# Patient Record
Sex: Female | Born: 1940 | Race: Black or African American | Hispanic: No | Smoking: Never smoker
Health system: Southern US, Community
[De-identification: ages and names within clinical notes are randomized; demographics above are authoritative.]

## PROBLEM LIST (undated history)

## (undated) DIAGNOSIS — Z8679 Personal history of other diseases of the circulatory system: Secondary | ICD-10-CM

## (undated) DIAGNOSIS — I10 Essential (primary) hypertension: Secondary | ICD-10-CM

## (undated) DIAGNOSIS — I495 Sick sinus syndrome: Secondary | ICD-10-CM

## (undated) DIAGNOSIS — I480 Paroxysmal atrial fibrillation: Secondary | ICD-10-CM

## (undated) DIAGNOSIS — Z952 Presence of prosthetic heart valve: Secondary | ICD-10-CM

## (undated) DIAGNOSIS — Z45018 Encounter for adjustment and management of other part of cardiac pacemaker: Secondary | ICD-10-CM

## (undated) DIAGNOSIS — T4145XA Adverse effect of unspecified anesthetic, initial encounter: Secondary | ICD-10-CM

## (undated) DIAGNOSIS — I5032 Chronic diastolic (congestive) heart failure: Secondary | ICD-10-CM

## (undated) DIAGNOSIS — R0602 Shortness of breath: Secondary | ICD-10-CM

## (undated) DIAGNOSIS — T8859XA Other complications of anesthesia, initial encounter: Secondary | ICD-10-CM

## (undated) DIAGNOSIS — Z9289 Personal history of other medical treatment: Secondary | ICD-10-CM

## (undated) DIAGNOSIS — N189 Chronic kidney disease, unspecified: Secondary | ICD-10-CM

## (undated) DIAGNOSIS — M1990 Unspecified osteoarthritis, unspecified site: Secondary | ICD-10-CM

## (undated) DIAGNOSIS — Z950 Presence of cardiac pacemaker: Secondary | ICD-10-CM

## (undated) DIAGNOSIS — I350 Nonrheumatic aortic (valve) stenosis: Secondary | ICD-10-CM

## (undated) HISTORY — DX: Presence of prosthetic heart valve: Z95.2

## (undated) HISTORY — PX: CARDIAC VALVE REPLACEMENT: SHX585

## (undated) HISTORY — PX: JOINT REPLACEMENT: SHX530

## (undated) HISTORY — PX: CARDIAC CATHETERIZATION: SHX172

## (undated) HISTORY — DX: Personal history of other diseases of the circulatory system: Z86.79

---
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
## 2001-05-06 ENCOUNTER — Encounter: Payer: Self-pay | Admitting: Family Medicine

## 2001-05-06 ENCOUNTER — Ambulatory Visit (HOSPITAL_COMMUNITY): Admission: RE | Admit: 2001-05-06 | Discharge: 2001-05-06 | Payer: Self-pay | Admitting: Family Medicine

## 2002-12-31 ENCOUNTER — Ambulatory Visit (HOSPITAL_COMMUNITY): Admission: RE | Admit: 2002-12-31 | Discharge: 2002-12-31 | Payer: Self-pay | Admitting: Family Medicine

## 2011-08-17 ENCOUNTER — Inpatient Hospital Stay (HOSPITAL_COMMUNITY)
Admission: EM | Admit: 2011-08-17 | Discharge: 2011-08-23 | DRG: 292 | Disposition: A | Discharge status: Home / Self Care | Payer: Medicare Other | Source: Ambulatory Visit | Attending: Internal Medicine | Admitting: Internal Medicine

## 2011-08-17 ENCOUNTER — Encounter (HOSPITAL_COMMUNITY): Payer: Self-pay | Admitting: Emergency Medicine

## 2011-08-17 ENCOUNTER — Emergency Department (HOSPITAL_COMMUNITY): Payer: Medicare Other

## 2011-08-17 DIAGNOSIS — R0602 Shortness of breath: Secondary | ICD-10-CM

## 2011-08-17 DIAGNOSIS — J90 Pleural effusion, not elsewhere classified: Secondary | ICD-10-CM

## 2011-08-17 DIAGNOSIS — R0902 Hypoxemia: Secondary | ICD-10-CM | POA: Diagnosis present

## 2011-08-17 DIAGNOSIS — E876 Hypokalemia: Secondary | ICD-10-CM

## 2011-08-17 DIAGNOSIS — R601 Generalized edema: Secondary | ICD-10-CM | POA: Diagnosis present

## 2011-08-17 DIAGNOSIS — Z7982 Long term (current) use of aspirin: Secondary | ICD-10-CM

## 2011-08-17 DIAGNOSIS — R7309 Other abnormal glucose: Secondary | ICD-10-CM | POA: Diagnosis present

## 2011-08-17 DIAGNOSIS — I10 Essential (primary) hypertension: Secondary | ICD-10-CM

## 2011-08-17 DIAGNOSIS — R188 Other ascites: Secondary | ICD-10-CM | POA: Diagnosis present

## 2011-08-17 DIAGNOSIS — I509 Heart failure, unspecified: Secondary | ICD-10-CM

## 2011-08-17 DIAGNOSIS — I2789 Other specified pulmonary heart diseases: Secondary | ICD-10-CM | POA: Diagnosis present

## 2011-08-17 DIAGNOSIS — I359 Nonrheumatic aortic valve disorder, unspecified: Secondary | ICD-10-CM | POA: Diagnosis present

## 2011-08-17 DIAGNOSIS — I5033 Acute on chronic diastolic (congestive) heart failure: Secondary | ICD-10-CM

## 2011-08-17 HISTORY — DX: Essential (primary) hypertension: I10

## 2011-08-17 LAB — CBC
Platelets: 179 10*3/uL (ref 150–400)
RBC: 4.1 MIL/uL (ref 3.87–5.11)
RDW: 16.7 % — ABNORMAL HIGH (ref 11.5–15.5)
WBC: 5.5 10*3/uL (ref 4.0–10.5)

## 2011-08-17 LAB — BASIC METABOLIC PANEL
Calcium: 9 mg/dL (ref 8.4–10.5)
GFR calc Af Amer: 63 mL/min — ABNORMAL LOW (ref >90)
GFR calc non Af Amer: 54 mL/min — ABNORMAL LOW (ref >90)
Sodium: 142 mEq/L (ref 135–145)

## 2011-08-17 LAB — POCT I-STAT TROPONIN I

## 2011-08-17 LAB — PRO B NATRIURETIC PEPTIDE: Pro B Natriuretic peptide (BNP): 29853 pg/mL — ABNORMAL HIGH (ref 0–125)

## 2011-08-17 MED ORDER — FUROSEMIDE 8 MG/ML PO SOLN: 40.0000 mg | Freq: Once | ORAL | Status: DC

## 2011-08-17 MED ORDER — POTASSIUM CHLORIDE CRYS ER 20 MEQ PO TBCR
40.0000 meq | EXTENDED_RELEASE_TABLET | Freq: Once | ORAL | Status: AC
  Administered 2011-08-17: 40 meq via ORAL
  Filled 2011-08-17: qty 2

## 2011-08-17 MED ORDER — FUROSEMIDE 10 MG/ML IJ SOLN
40.0000 mg | Freq: Once | INTRAMUSCULAR | Status: AC
  Administered 2011-08-17: 40 mg via INTRAVENOUS
  Filled 2011-08-17: qty 4

## 2011-08-17 NOTE — ED Notes (Signed)
O2 sat checked at nurse first for SOB

## 2011-08-17 NOTE — ED Provider Notes (Signed)
History     CSN: [id number]  Arrival date & time 08/17/11  1919   First MD Initiated Contact with Patient 08/17/11 2306      Chief Complaint  Patient presents with  . Shortness of Breath    (Consider location/radiation/quality/duration/timing/severity/associated sxs/prior treatment) HPI History provided by patient. Increased swelling over the last 3-4 months, followed by primary care physician on [address] was referred to cardiologist Dr. Jacinto Halim and had an outpatient echocardiogram done in the office today. Results unknown. Patient developed shortness of breath tonight difficulty breathing presents here for evaluation. No chest pain. No cough or fevers. No history of same. Patient was prescribed Lasix and is unchanged. Shortness of breath is worse with any exertion and lying flat. Moderate in severity. Somewhat relieved by rest. Past Medical History  Diagnosis Date  . Hypertension     History reviewed. No pertinent past surgical history.  No family history on file.  History  Substance Use Topics  . Smoking status: Never Smoker   . Smokeless tobacco: Not on file  . Alcohol Use: No    OB History    Grav Para Term Preterm Abortions TAB SAB Ect Mult Living                  Review of Systems  Constitutional: Negative for fever and chills.  HENT: Negative for neck pain and neck stiffness.   Eyes: Negative for pain.  Respiratory: Positive for shortness of breath. Negative for wheezing.   Cardiovascular: Positive for leg swelling. Negative for chest pain.  Gastrointestinal: Negative for abdominal pain.  Genitourinary: Negative for dysuria.  Musculoskeletal: Negative for back pain.  Skin: Negative for rash.  Neurological: Negative for headaches.  All other systems reviewed and are negative.    Allergies  Review of patient's allergies indicates no known allergies.  Home Medications   Current Outpatient Rx  Name Route Sig Dispense Refill  . ASPIRIN 81 MG PO TABS  Oral Take 81 mg by mouth daily.    . FUROSEMIDE 40 MG PO TABS Oral Take 40 mg by mouth daily.    Marland Kitchen LOSARTAN POTASSIUM 50 MG PO TABS Oral Take 50 mg by mouth daily.    Marland Kitchen METOPROLOL TARTRATE 25 MG PO TABS Oral Take 25 mg by mouth 2 (two) times daily.    Marland Kitchen TIZANIDINE HCL 4 MG PO TABS Oral Take 4 mg by mouth every 6 (six) hours as needed. For pain      BP 171/64  Pulse 76  Temp 98.5 F (36.9 C) (Oral)  Resp 19  SpO2 98%  Physical Exam  Constitutional: She is oriented to person, place, and time. She appears well-developed and well-nourished.  HENT:  Head: Normocephalic and atraumatic.  Eyes: Conjunctivae and EOM are normal. Pupils are equal, round, and reactive to light.  Neck: Trachea normal. Neck supple. No thyromegaly present.  Cardiovascular: Normal rate, regular rhythm, S1 normal, S2 normal and normal pulses.     No systolic murmur is present   No diastolic murmur is present  Pulses:      Radial pulses are 2+ on the right side, and 2+ on the left side.  Pulmonary/Chest: Effort normal. She has no wheezes. She has no rhonchi. She has no rales. She exhibits no tenderness.       Somewhat diminished lung sounds bilateral bases, no wheezes or rales  Abdominal: Soft. Normal appearance and bowel sounds are normal. There is no tenderness. There is no CVA tenderness and negative  Murphy's sign.  Musculoskeletal:       BLE:s  Tense 2+ symmetric peripheral edema, Calves nontender, no cords or erythema, negative Homans sign  Neurological: She is alert and oriented to person, place, and time. She has normal strength. No cranial nerve deficit or sensory deficit. GCS eye subscore is 4. GCS verbal subscore is 5. GCS motor subscore is 6.  Skin: Skin is warm and dry. No rash noted. She is not diaphoretic.  Psychiatric: Her speech is normal.       Cooperative and appropriate    ED Course  Procedures (including critical care time)   Results for orders placed during the hospital encounter of  08/17/11  BASIC METABOLIC PANEL      Component Value Range   Sodium 142  135 - 145 mEq/L   Potassium 3.1 (*) 3.5 - 5.1 mEq/L   Chloride 103  96 - 112 mEq/L   CO2 27  19 - 32 mEq/L   Glucose, Bld 119 (*) 70 - 99 mg/dL   BUN 19  6 - 23 mg/dL   Creatinine, Ser 1.61  0.50 - 1.10 mg/dL   Calcium 9.0  8.4 - 09.6 mg/dL   GFR calc non Af Amer 54 (*) >90 mL/min   GFR calc Af Amer 63 (*) >90 mL/min  CBC      Component Value Range   WBC 5.5  4.0 - 10.5 K/uL   RBC 4.10  3.87 - 5.11 MIL/uL   Hemoglobin 12.5  12.0 - 15.0 g/dL   HCT 04.5  40.9 - 81.1 %   MCV 92.9  78.0 - 100.0 fL   MCH 30.5  26.0 - 34.0 pg   MCHC 32.8  30.0 - 36.0 g/dL   RDW 91.4 (*) 78.2 - 95.6 %   Platelets 179  150 - 400 K/uL  PRO B NATRIURETIC PEPTIDE      Component Value Range   Pro B Natriuretic peptide (BNP) 29853.0 (*) 0 - 125 pg/mL  POCT I-STAT TROPONIN I      Component Value Range   Troponin i, poc 0.07  0.00 - 0.08 ng/mL   Comment 3            Dg Chest 2 View  08/17/2011  *RADIOLOGY REPORT*  Clinical Data: Short of breath  CHEST - 2 VIEW  Comparison: None  Findings: Heart silhouette is enlarged.  There is a large left effusion and small right effusion.  Central venous congestion is present.  No pneumothorax.  IMPRESSION:  1.  Asymmetric pleural effusions, left greater than right. Recommend follow-up to ensure resolution as malignancy cannot be excluded. 2.  Cardiomegaly.  Original Report Authenticated By: Genevive Bi, M.D.     Date: 08/17/2011  Rate: 73  Rhythm: normal sinus rhythm  QRS Axis: normal  Intervals: normal  ST/T Wave abnormalities: nonspecific ST/T changes  Conduction Disutrbances:none  Narrative Interpretation:   Old EKG Reviewed: none available  12:02 AM discussed with Dr. Onalee Hua on call for triad, agrees to admission. Will evaluate patient in emergency department.  MDM  SOB and hypoxia.  EKG as above. Labs and x-ray reviewed as above. Patient ambulates in room air pulse ox 89% hypoxic.  Placed on 2 L nasal cannula oxygen. Attempted to get outpatient echo results. Medicine consult for admission and further workup for new onset CHF / pleural effusions        Sunnie Nielsen, MD 08/18/11 0002

## 2011-08-17 NOTE — ED Notes (Signed)
Pt RA pulse ox at rest was 96%. Pt walked one complete lap around POD A on RA. Pt O2 sat decreased to 89% just before she returned to her room. Pt sitting in bed. O2 saturation increasing >92%. While walking pt denies chest tightness only mild shortness of breath

## 2011-08-17 NOTE — ED Notes (Signed)
C/o sob and generalized swelling all over x 4 months.  States SOB worse today.  Speaking in complete sentences.  NAD.

## 2011-08-18 DIAGNOSIS — R609 Edema, unspecified: Secondary | ICD-10-CM

## 2011-08-18 DIAGNOSIS — R0602 Shortness of breath: Secondary | ICD-10-CM

## 2011-08-18 DIAGNOSIS — I5033 Acute on chronic diastolic (congestive) heart failure: Secondary | ICD-10-CM | POA: Diagnosis present

## 2011-08-18 DIAGNOSIS — I509 Heart failure, unspecified: Secondary | ICD-10-CM

## 2011-08-18 DIAGNOSIS — R0902 Hypoxemia: Secondary | ICD-10-CM | POA: Diagnosis present

## 2011-08-18 DIAGNOSIS — I10 Essential (primary) hypertension: Secondary | ICD-10-CM | POA: Diagnosis present

## 2011-08-18 DIAGNOSIS — J90 Pleural effusion, not elsewhere classified: Secondary | ICD-10-CM

## 2011-08-18 LAB — CBC
HCT: 37.6 % (ref 36.0–46.0)
Hemoglobin: 12.4 g/dL (ref 12.0–15.0)
MCH: 30.4 pg (ref 26.0–34.0)
MCHC: 33 g/dL (ref 30.0–36.0)
RDW: 16.7 % — ABNORMAL HIGH (ref 11.5–15.5)

## 2011-08-18 LAB — BASIC METABOLIC PANEL
CO2: 25 mEq/L (ref 19–32)
Calcium: 8.7 mg/dL (ref 8.4–10.5)
GFR calc non Af Amer: 58 mL/min — ABNORMAL LOW (ref >90)
Glucose, Bld: 144 mg/dL — ABNORMAL HIGH (ref 70–99)
Potassium: 3.1 mEq/L — ABNORMAL LOW (ref 3.5–5.1)
Sodium: 142 mEq/L (ref 135–145)

## 2011-08-18 LAB — CARDIAC PANEL(CRET KIN+CKTOT+MB+TROPI)
Relative Index: INVALID (ref 0.0–2.5)
Relative Index: INVALID (ref 0.0–2.5)
Relative Index: INVALID (ref 0.0–2.5)
Total CK: 55 U/L (ref 7–177)
Total CK: 47 U/L (ref 7–177)
Total CK: 40 U/L (ref 7–177)
Troponin I: <0.3 ng/mL (ref <0.30)
Troponin I: <0.3 ng/mL (ref <0.30)

## 2011-08-18 LAB — HEPATIC FUNCTION PANEL
Bilirubin, Direct: 0.3 mg/dL (ref 0.0–0.3)
Indirect Bilirubin: 0.7 mg/dL (ref 0.3–0.9)
Total Protein: 6.3 g/dL (ref 6.0–8.3)

## 2011-08-18 MED ORDER — SODIUM CHLORIDE 0.9 % IJ SOLN
3.0000 mL | Freq: Two times a day (BID) | INTRAMUSCULAR | Status: DC
  Administered 2011-08-18 – 2011-08-22 (×10): 3 mL via INTRAVENOUS

## 2011-08-18 MED ORDER — METOPROLOL TARTRATE 25 MG PO TABS
25.0000 mg | ORAL_TABLET | Freq: Two times a day (BID) | ORAL | Status: DC
  Administered 2011-08-18 (×2): 25 mg via ORAL
  Filled 2011-08-18 (×4): qty 1

## 2011-08-18 MED ORDER — LABETALOL HCL 5 MG/ML IV SOLN
10.0000 mg | Freq: Once | INTRAVENOUS | Status: AC
  Administered 2011-08-18: 10 mg via INTRAVENOUS
  Filled 2011-08-18: qty 4

## 2011-08-18 MED ORDER — ENOXAPARIN SODIUM 40 MG/0.4ML ~~LOC~~ SOLN
40.0000 mg | Freq: Every day | SUBCUTANEOUS | Status: DC
  Administered 2011-08-18 – 2011-08-23 (×6): 40 mg via SUBCUTANEOUS
  Filled 2011-08-18 (×6): qty 0.4

## 2011-08-18 MED ORDER — SODIUM CHLORIDE 0.9 % IJ SOLN: 3.0000 mL | INTRAMUSCULAR | Status: DC | PRN

## 2011-08-18 MED ORDER — POTASSIUM CHLORIDE CRYS ER 20 MEQ PO TBCR
20.0000 meq | EXTENDED_RELEASE_TABLET | Freq: Every day | ORAL | Status: DC
  Administered 2011-08-18 – 2011-08-23 (×6): 20 meq via ORAL
  Filled 2011-08-18 (×6): qty 1

## 2011-08-18 MED ORDER — ONDANSETRON HCL 4 MG/2ML IJ SOLN: 4.0000 mg | Freq: Four times a day (QID) | INTRAMUSCULAR | Status: DC | PRN

## 2011-08-18 MED ORDER — ACETAMINOPHEN 325 MG PO TABS: 650.0000 mg | ORAL_TABLET | ORAL | Status: DC | PRN

## 2011-08-18 MED ORDER — SODIUM CHLORIDE 0.9 % IV SOLN: 250.0000 mL | INTRAVENOUS | Status: DC | PRN

## 2011-08-18 MED ORDER — LOSARTAN POTASSIUM 50 MG PO TABS
50.0000 mg | ORAL_TABLET | Freq: Every day | ORAL | Status: DC
  Administered 2011-08-18 – 2011-08-21 (×4): 50 mg via ORAL
  Filled 2011-08-18 (×4): qty 1

## 2011-08-18 MED ORDER — POTASSIUM CHLORIDE CRYS ER 20 MEQ PO TBCR
40.0000 meq | EXTENDED_RELEASE_TABLET | Freq: Once | ORAL | Status: AC
  Administered 2011-08-18: 40 meq via ORAL
  Filled 2011-08-18: qty 2

## 2011-08-18 MED ORDER — FUROSEMIDE 10 MG/ML IJ SOLN
40.0000 mg | Freq: Every day | INTRAMUSCULAR | Status: DC
  Administered 2011-08-18 – 2011-08-19 (×2): 40 mg via INTRAVENOUS
  Filled 2011-08-18 (×2): qty 4

## 2011-08-18 MED ORDER — SODIUM CHLORIDE 0.9 % IV SOLN
250.0000 mL | INTRAVENOUS | Status: DC | PRN
  Administered 2011-08-18: 250 mL via INTRAVENOUS

## 2011-08-18 MED ORDER — SODIUM CHLORIDE 0.9 % IJ SOLN: 3.0000 mL | Freq: Two times a day (BID) | INTRAMUSCULAR | Status: DC

## 2011-08-18 MED ORDER — ASPIRIN EC 81 MG PO TBEC
81.0000 mg | DELAYED_RELEASE_TABLET | Freq: Every day | ORAL | Status: DC
  Administered 2011-08-18 – 2011-08-21 (×4): 81 mg via ORAL
  Filled 2011-08-18 (×4): qty 1

## 2011-08-18 NOTE — Progress Notes (Signed)
Utilization review completed.  

## 2011-08-18 NOTE — Progress Notes (Signed)
Subjective:   Chart reviewed. Patient indicates that she feels whole lot better-decreasing swelling of her arms, legs and abdomen. Denies ever having dyspnea. No chest pain.   Saw Dr. Jacinto Halim on 6/19 and had echo at his office on 6/27.  Objective  Vital signs in last 24 hours: Filed Vitals:   08/18/11 0429 08/18/11 0433 08/18/11 1113 08/18/11 1344  BP: 153/58 150/62 169/80 146/54  Pulse: 77  68 74  Temp: 98 F (36.7 C)   97.3 F (36.3 C)  TempSrc: Oral   Oral  Resp: 20   20  Height:      Weight:      SpO2: 91%  100% 100%   Weight change:   Intake/Output Summary (Last 24 hours) at 08/18/11 1839 Last data filed at 08/18/11 1700  Gross per 24 hour  Intake    360 ml  Output   1550 ml  Net  -1190 ml    Physical Exam:  General Exam: Comfortable.  Respiratory System: Slightly reduced breath sounds in the bases with occasional basal crackles. Rest of lung fields are clear to auscultation. No increased work of breathing.  Cardiovascular System: First and second heart sounds heard. Regular rate and rhythm. No JVD/murmurs. Telemetry shows sinus rhythm with first-degree heart block. Gastrointestinal System: Abdomen is non distended, soft and normal bowel sounds heard. Anterior abdominal wall edema. Nontender.  Central Nervous System: Alert and oriented. No focal neurological deficits. Extremities: 3+ chronic pitting bilateral lower extremity edema extending up the anterior abdominal wall anteriorly and sacrum posteriorly. No edema of upper extremities.  Labs:  Basic Metabolic Panel:  Lab 08/18/11 1610 08/18/11 0339 08/17/11 1944  NA 142 -- 142  K 3.1* -- 3.1*  CL 103 -- 103  CO2 25 -- 27  GLUCOSE 144* -- 119*  BUN 21 -- 19  CREATININE 0.96 1.01 1.02  CALCIUM 8.7 -- 9.0  ALB -- -- --  PHOS -- -- --   Liver Function Tests:  Lab 08/17/11 2314  AST 26  ALT 23  ALKPHOS 62  BILITOT 1.0  PROT 6.3  ALBUMIN 3.2*   No results found for this basename: LIPASE:3,AMYLASE:3 in  the last 168 hours No results found for this basename: AMMONIA:3 in the last 168 hours CBC:  Lab 08/18/11 0339 08/17/11 1944  WBC 4.6 5.5  NEUTROABS -- --  HGB 12.4 12.5  HCT 37.6 38.1  MCV 92.2 92.9  PLT 184 179   Cardiac Enzymes:  Lab 08/18/11 0955 08/18/11 0339  CKTOTAL 47 55  CKMB 4.3* 4.3*  CKMBINDEX -- --  TROPONINI <0.30 <0.30   CBG: No results found for this basename: GLUCAP:5 in the last 168 hours  Iron Studies: No results found for this basename: IRON,TIBC,TRANSFERRIN,FERRITIN in the last 72 hours Studies/Results: Dg Chest 2 View  08/17/2011  *RADIOLOGY REPORT*  Clinical Data: Short of breath  CHEST - 2 VIEW  Comparison: None  Findings: Heart silhouette is enlarged.  There is a large left effusion and small right effusion.  Central venous congestion is present.  No pneumothorax.  IMPRESSION:  1.  Asymmetric pleural effusions, left greater than right. Recommend follow-up to ensure resolution as malignancy cannot be excluded. 2.  Cardiomegaly.  Original Report Authenticated By: Genevive Bi, M.D.   Medications:      . aspirin EC  81 mg Oral Daily  . enoxaparin  40 mg Subcutaneous Daily  . furosemide  40 mg Intravenous Once  . furosemide  40 mg Intravenous Daily  .  labetalol  10 mg Intravenous Once  . losartan  50 mg Oral Daily  . metoprolol tartrate  25 mg Oral BID  . potassium chloride  20 mEq Oral Daily  . potassium chloride  40 mEq Oral Once  . sodium chloride  3 mL Intravenous Q12H  . DISCONTD: furosemide  40 mg Oral Once  . DISCONTD: sodium chloride  3 mL Intravenous Q12H    I  have reviewed scheduled and prn medications.     Problem/Plan: Principal Problem:  *CHF exacerbation Active Problems:  SOB (shortness of breath)  Pleural effusion, bilateral  Hypertension  Hypoxia  1. CHF exacerbation, possibly acute on chronic diastolic: Diuresing well. Continue IV Lasix. Improving. Called primary cardiologist office to obtain records. Await 2-D echo  report-will be available on 7/1. 2. Hypokalemia: Replete and follow. 3. Pleural effusions: Possibly secondary to problem #1. Followup chest x-ray to insure resolution. 4. Hypertension: Controlled. 5. Hypoxia: Secondary to CHF and pleural effusions. Treat above and monitor.  [name] 08/18/2011,6:39 PM  LOS: 1 day

## 2011-08-18 NOTE — H&P (Signed)
Chief Complaint:  sob  HPI: 71 yo female newly dx chf a couple of months ago new on lasix also last several months comes in with worsening sob and le edema.  Her cousin finally made her come to ED.  She really does not complain much and have to really pull pertinent history from her.  She does admit to some orthopnea.  Denies any cp, n,v.  Le edema has been the same and no better with po lasix at home.  She did have a 2d echo done by her pcp today and she has been told that "her heart does not pump right".  She denies any fevers or cough.  She does not require oxygen at home and says her sob gets worse with exertion.  Review of Systems:  O/w neg  Past Medical History: Past Medical History  Diagnosis Date  . Hypertension    History reviewed. No pertinent past surgical history.  Medications: Prior to Admission medications   Medication Sig Start Date End Date Taking? Authorizing Provider  aspirin 81 MG tablet Take 81 mg by mouth daily.   Yes Historical Provider, MD  furosemide (LASIX) 40 MG tablet Take 40 mg by mouth daily.   Yes Historical Provider, MD  losartan (COZAAR) 50 MG tablet Take 50 mg by mouth daily.   Yes Historical Provider, MD  metoprolol tartrate (LOPRESSOR) 25 MG tablet Take 25 mg by mouth 2 (two) times daily.   Yes Historical Provider, MD  tiZANidine (ZANAFLEX) 4 MG tablet Take 4 mg by mouth every 6 (six) hours as needed. For pain   Yes Historical Provider, MD    Allergies:  No Known Allergies  Social History:  reports that she has never smoked. She does not have any smokeless tobacco history on file. She reports that she does not drink alcohol or use illicit drugs.  Physical Exam: Filed Vitals:   08/17/11 1920 08/17/11 1929  BP:  171/64  Pulse: 80 76  Temp:  98.5 F (36.9 C)  TempSrc:  Oral  Resp:  19  SpO2: 95% 98%   General appearance: alert, cooperative and no distress Neck: no carotid bruit, no JVD and supple, symmetrical, trachea midline Lungs:  diminished breath sounds bilaterally Heart: regular rate and rhythm, S1, S2 normal, no murmur, click, rub or gallop Abdomen: soft, non-tender; bowel sounds normal; no masses,  no organomegaly Extremities: extremities normal, atraumatic, no cyanosis or 1+ pitting edema ble Pulses: 2+ and symmetric Skin: Skin color, texture, turgor normal. No rashes or lesions Neurologic: Grossly normal    Labs on Admission:   [facility] 08/17/11 1944  NA 142  K 3.1*  CL 103  CO2 27  GLUCOSE 119*  BUN 19  CREATININE 1.02  CALCIUM 9.0  MG --  PHOS --    Basename 08/17/11 1944  WBC 5.5  NEUTROABS --  HGB 12.5  HCT 38.1  MCV 92.9  PLT 179   Radiological Exams on Admission: Dg Chest 2 View  08/17/2011  *RADIOLOGY REPORT*  Clinical Data: Short of breath  CHEST - 2 VIEW  Comparison: None  Findings: Heart silhouette is enlarged.  There is a large left effusion and small right effusion.  Central venous congestion is present.  No pneumothorax.  IMPRESSION:  1.  Asymmetric pleural effusions, left greater than right. Recommend follow-up to ensure resolution as malignancy cannot be excluded. 2.  Cardiomegaly.  Original Report Authenticated By: Genevive Bi, M.D.    Assessment/Plan Present on Admission:  71 yo female acute  chf exacerbation .CHF exacerbation .SOB (shortness of breath) .Pleural effusion, bilateral .Hypertension .Hypoxia  ekg no acute changes.  Will place on iv lasix.  Obtain echo that was done today as outpt.  Serial cardiac enzymes.   chf pathway.  Oxygen.  cxr will need to be followed to monitor pleural effusion left >right.  Wbc nml, no fevers will not tx with abx at this time.    Hussam Muniz A [phone] 08/18/2011, 12:04 AM

## 2011-08-18 NOTE — Progress Notes (Signed)
Pt's B/P is 174/76. Pt is asymptomatic at this time. MD made aware. New orders placed. Will continue to monitor closely. Sharlene Dory, RN

## 2011-08-19 DIAGNOSIS — R609 Edema, unspecified: Secondary | ICD-10-CM

## 2011-08-19 DIAGNOSIS — J90 Pleural effusion, not elsewhere classified: Secondary | ICD-10-CM

## 2011-08-19 DIAGNOSIS — E876 Hypokalemia: Secondary | ICD-10-CM

## 2011-08-19 DIAGNOSIS — I5033 Acute on chronic diastolic (congestive) heart failure: Secondary | ICD-10-CM

## 2011-08-19 LAB — BASIC METABOLIC PANEL
BUN: 21 mg/dL (ref 6–23)
Chloride: 107 mEq/L (ref 96–112)
GFR calc Af Amer: 74 mL/min — ABNORMAL LOW (ref >90)
GFR calc non Af Amer: 64 mL/min — ABNORMAL LOW (ref >90)
Potassium: 4.2 mEq/L (ref 3.5–5.1)
Sodium: 144 mEq/L (ref 135–145)

## 2011-08-19 MED ORDER — METOPROLOL TARTRATE 12.5 MG HALF TABLET
12.5000 mg | ORAL_TABLET | Freq: Two times a day (BID) | ORAL | Status: DC
  Administered 2011-08-19 – 2011-08-23 (×9): 12.5 mg via ORAL
  Filled 2011-08-19 (×10): qty 1

## 2011-08-19 MED ORDER — FUROSEMIDE 10 MG/ML IJ SOLN
40.0000 mg | Freq: Two times a day (BID) | INTRAMUSCULAR | Status: DC
  Administered 2011-08-19 – 2011-08-20 (×2): 40 mg via INTRAVENOUS
  Filled 2011-08-19 (×4): qty 4

## 2011-08-19 NOTE — Progress Notes (Signed)
Subjective:   Feels better. Dyspnea on mild exertion. No chest pain. Decreasing swelling of legs. Gives approximately 6 month history of gradually worsening swelling of legs, hands, abdomen and orthopnea.  Saw Dr. Jacinto Halim on 6/19 and had echo at his office on 6/27. Reviewed office consult note-anasarca, rule out nephrotic range proteinuria, aortic incompetence. Was started on Lasix, beta blockers and was to followup with echo results-pending.  Objective  Vital signs in last 24 hours: Filed Vitals:   08/18/11 1113 08/18/11 1344 08/18/11 2201 08/19/11 0441  BP: 169/80 146/54 142/56 140/66  Pulse: 68 74 70 72  Temp:  97.3 F (36.3 C) 98.4 F (36.9 C) 98 F (36.7 C)  TempSrc:  Oral Oral Oral  Resp:  20 20 20   Height:    5\' 4"  (1.626 m)  Weight:    82.4 kg (181 lb 10.5 oz)  SpO2: 100% 100% 97% 97%   Weight change: 0.2 kg (7.1 oz)  Intake/Output Summary (Last 24 hours) at 08/19/11 1345 Last data filed at 08/19/11 1055  Gross per 24 hour  Intake    343 ml  Output    902 ml  Net   -559 ml    Physical Exam:  General Exam: Comfortable.  Respiratory System: Slightly reduced breath sounds in the bases with occasional basal crackles. Rest of lung fields are clear to auscultation. No increased work of breathing.  Cardiovascular System: First and second heart sounds heard. Regular rate and rhythm. No JVD/murmurs. Telemetry shows sinus rhythm in the 70s but does have periods of sinus bradycardia in the 30s to 40s (possibly during sleep and asymptomatic). Gastrointestinal System: Abdomen is non distended, soft and normal bowel sounds heard. Anterior abdominal wall edema. Nontender.  Central Nervous System: Alert and oriented. No focal neurological deficits. Extremities: 3+ chronic pitting bilateral lower extremity edema extending up the anterior abdominal wall anteriorly and sacrum posteriorly-decreasing. No edema of upper extremities.  Labs:  Basic Metabolic Panel:  Lab 08/19/11 1610  08/18/11 0955 08/18/11 0339 08/17/11 1944  NA 144 142 -- 142  K 4.2 3.1* -- 3.1*  CL 107 103 -- 103  CO2 29 25 -- 27  GLUCOSE 116* 144* -- 119*  BUN 21 21 -- 19  CREATININE 0.89 0.96 1.01 --  CALCIUM 8.6 8.7 -- 9.0  ALB -- -- -- --  PHOS -- -- -- --   Liver Function Tests:  Lab 08/17/11 2314  AST 26  ALT 23  ALKPHOS 62  BILITOT 1.0  PROT 6.3  ALBUMIN 3.2*   No results found for this basename: LIPASE:3,AMYLASE:3 in the last 168 hours No results found for this basename: AMMONIA:3 in the last 168 hours CBC:  Lab 08/18/11 0339 08/17/11 1944  WBC 4.6 5.5  NEUTROABS -- --  HGB 12.4 12.5  HCT 37.6 38.1  MCV 92.2 92.9  PLT 184 179   Cardiac Enzymes:  Lab 08/18/11 1802 08/18/11 0955 08/18/11 0339  CKTOTAL 40 47 55  CKMB 4.5* 4.3* 4.3*  CKMBINDEX -- -- --  TROPONINI <0.30 <0.30 <0.30   CBG: No results found for this basename: GLUCAP:5 in the last 168 hours  Iron Studies: No results found for this basename: IRON,TIBC,TRANSFERRIN,FERRITIN in the last 72 hours Studies/Results: Dg Chest 2 View  08/17/2011  *RADIOLOGY REPORT*  Clinical Data: Short of breath  CHEST - 2 VIEW  Comparison: None  Findings: Heart silhouette is enlarged.  There is a large left effusion and small right effusion.  Central venous congestion is present.  No pneumothorax.  IMPRESSION:  1.  Asymmetric pleural effusions, left greater than right. Recommend follow-up to ensure resolution as malignancy cannot be excluded. 2.  Cardiomegaly.  Original Report Authenticated By: Genevive Bi, M.D.   Medications:      . aspirin EC  81 mg Oral Daily  . enoxaparin  40 mg Subcutaneous Daily  . furosemide  40 mg Intravenous Daily  . losartan  50 mg Oral Daily  . metoprolol tartrate  12.5 mg Oral BID  . potassium chloride  20 mEq Oral Daily  . potassium chloride  40 mEq Oral Once  . sodium chloride  3 mL Intravenous Q12H  . DISCONTD: metoprolol tartrate  25 mg Oral BID    I  have reviewed scheduled and prn  medications.     Problem/Plan: Principal Problem:  *CHF exacerbation Active Problems:  SOB (shortness of breath)  Pleural effusion, bilateral  Hypertension  Hypoxia  1. CHF exacerbation, possibly acute on chronic diastolic: ? Incomplete charting of I/O and weight's. Increase IV Lasix. Improving.  Await 2-D echo report, done at primary cardiologist office-will be available on 7/1. Reduce metoprolol dose secondary to bradycardia. 2. Anasarca: Possibly secondary to problem #1. Rule out nephrotic range proteinuria. Check 24-hour urinary protein. 3. Hypokalemia: Repleted. Follow daily BMP 4. Pleural effusions: Possibly secondary to problem #1. Followup chest x-ray to ensure resolution. 5. Hypertension: Controlled. 6. Hypoxia: Secondary to CHF and pleural effusions. Treat above and monitor.  [name] 08/19/2011,1:45 PM  LOS: 2 days

## 2011-08-20 DIAGNOSIS — E876 Hypokalemia: Secondary | ICD-10-CM

## 2011-08-20 DIAGNOSIS — J90 Pleural effusion, not elsewhere classified: Secondary | ICD-10-CM

## 2011-08-20 DIAGNOSIS — R609 Edema, unspecified: Secondary | ICD-10-CM

## 2011-08-20 DIAGNOSIS — I5033 Acute on chronic diastolic (congestive) heart failure: Secondary | ICD-10-CM

## 2011-08-20 LAB — PROTEIN, URINE, 24 HOUR
Collection Interval-UPROT: 24 hours
Protein, 24H Urine: 288 mg/d — ABNORMAL HIGH (ref 50–100)
Protein, Urine: 24 mg/dL

## 2011-08-20 LAB — BASIC METABOLIC PANEL
BUN: 24 mg/dL — ABNORMAL HIGH (ref 6–23)
Calcium: 8.7 mg/dL (ref 8.4–10.5)
Chloride: 105 mEq/L (ref 96–112)
Creatinine, Ser: 1.02 mg/dL (ref 0.50–1.10)
GFR calc Af Amer: 63 mL/min — ABNORMAL LOW (ref >90)
GFR calc non Af Amer: 54 mL/min — ABNORMAL LOW (ref >90)

## 2011-08-20 MED ORDER — FUROSEMIDE 10 MG/ML IJ SOLN
80.0000 mg | Freq: Two times a day (BID) | INTRAMUSCULAR | Status: DC
  Administered 2011-08-20 – 2011-08-23 (×6): 80 mg via INTRAVENOUS
  Filled 2011-08-20 (×8): qty 8

## 2011-08-20 MED ORDER — FUROSEMIDE 10 MG/ML IJ SOLN
40.0000 mg | Freq: Once | INTRAMUSCULAR | Status: AC
  Administered 2011-08-20: 40 mg via INTRAVENOUS

## 2011-08-20 NOTE — Progress Notes (Signed)
Was notified that pt had a 7 beat run of V.tach., when looked at strip it was artifact.  Will continue to monitor.

## 2011-08-20 NOTE — Progress Notes (Signed)
Subjective:   Feels better. Dyspnea on mild exertion. No chest pain. Decreasing swelling of legs. No new complaints. Gives approximately 6 month history of gradually worsening swelling of legs, hands, abdomen and orthopnea.  Saw Dr. Jacinto Halim on 6/19 and had echo at his office on 6/27. Reviewed office consult note-anasarca, rule out nephrotic range proteinuria, aortic incompetence. Was started on Lasix, beta blockers and was to followup with echo results-pending.  Objective  Vital signs in last 24 hours: Filed Vitals:   08/20/11 0905 08/20/11 0911 08/20/11 1033 08/20/11 1314  BP:   126/60 161/67  Pulse:   71 72  Temp:    97.2 F (36.2 C)  TempSrc:    Oral  Resp:    18  Height:      Weight:      SpO2: 97% 93%  94%   Weight change: -0.072 kg (-2.5 oz)  Intake/Output Summary (Last 24 hours) at 08/20/11 1430 Last data filed at 08/20/11 1300  Gross per 24 hour  Intake    723 ml  Output   1200 ml  Net   -477 ml    Physical Exam:  General Exam: Comfortable.  Respiratory System: Slightly reduced breath sounds in the bases with occasional basal crackles. Rest of lung fields are clear to auscultation. No increased work of breathing.  Cardiovascular System: First and second heart sounds heard. Regular rate and rhythm. No JVD/murmurs. Telemetry shows sinus rhythm in the 70'ss-80's.  Gastrointestinal System: Abdomen is non distended, soft and normal bowel sounds heard. Anterior abdominal wall edema-decreasing. Nontender.  Central Nervous System: Alert and oriented. No focal neurological deficits. Extremities: 3+ chronic pitting bilateral lower extremity edema extending up the anterior abdominal wall anteriorly and sacrum posteriorly-decreasing. No edema of upper extremities.  Labs:  Basic Metabolic Panel:  Lab 08/20/11 6295 08/19/11 0512 08/18/11 0955  NA 144 144 142  K 3.9 4.2 3.1*  CL 105 107 103  CO2 30 29 25   GLUCOSE 97 116* 144*  BUN 24* 21 21  CREATININE 1.02 0.89 0.96    CALCIUM 8.7 8.6 8.7  ALB -- -- --  PHOS -- -- --   Liver Function Tests:  Lab 08/17/11 2314  AST 26  ALT 23  ALKPHOS 62  BILITOT 1.0  PROT 6.3  ALBUMIN 3.2*   No results found for this basename: LIPASE:3,AMYLASE:3 in the last 168 hours No results found for this basename: AMMONIA:3 in the last 168 hours CBC:  Lab 08/18/11 0339 08/17/11 1944  WBC 4.6 5.5  NEUTROABS -- --  HGB 12.4 12.5  HCT 37.6 38.1  MCV 92.2 92.9  PLT 184 179   Cardiac Enzymes:  Lab 08/18/11 1802 08/18/11 0955 08/18/11 0339  CKTOTAL 40 47 55  CKMB 4.5* 4.3* 4.3*  CKMBINDEX -- -- --  TROPONINI <0.30 <0.30 <0.30   CBG: No results found for this basename: GLUCAP:5 in the last 168 hours  Iron Studies: No results found for this basename: IRON,TIBC,TRANSFERRIN,FERRITIN in the last 72 hours Studies/Results: Dg Chest 2 View  08/17/2011  *RADIOLOGY REPORT*  Clinical Data: Short of breath  CHEST - 2 VIEW  Comparison: None  Findings: Heart silhouette is enlarged.  There is a large left effusion and small right effusion.  Central venous congestion is present.  No pneumothorax.  IMPRESSION:  1.  Asymmetric pleural effusions, left greater than right. Recommend follow-up to ensure resolution as malignancy cannot be excluded. 2.  Cardiomegaly.  Original Report Authenticated By: Genevive Bi, M.D.   Medications:      .  aspirin EC  81 mg Oral Daily  . enoxaparin  40 mg Subcutaneous Daily  . furosemide  40 mg Intravenous Once  . furosemide  80 mg Intravenous Q12H  . losartan  50 mg Oral Daily  . metoprolol tartrate  12.5 mg Oral BID  . potassium chloride  20 mEq Oral Daily  . sodium chloride  3 mL Intravenous Q12H  . DISCONTD: furosemide  40 mg Intravenous Q12H    I  have reviewed scheduled and prn medications.     Problem/Plan: Principal Problem:  *CHF exacerbation Active Problems:  SOB (shortness of breath)  Pleural effusion, bilateral  Hypertension  Hypoxia  1. CHF exacerbation, possibly  acute on chronic diastolic: ? Incomplete charting of I/O and weight's. Increase IV Lasix. Improving.  Await 2-D echo report, done at primary cardiologist office-will be available on 7/1-will call in a.m. Continue metoprolol 12.5 twice a day. 2.  Anasarca: Possibly secondary to problem #1. Rule out nephrotic range proteinuria. Checking 24-hour urinary protein. 3. Hypokalemia: Repleted. Follow daily BMP 4. Pleural effusions: Possibly secondary to problem #1. Followup chest x-ray to ensure resolution. 5. Hypertension: Controlled. 6. Hypoxia: Secondary to CHF and pleural effusions. Treat above and monitor.  Disposition: Not medically ready for discharge.  Cheron Coryell 08/20/2011,2:30 PM  LOS: 3 days

## 2011-08-20 NOTE — Evaluation (Signed)
Physical Therapy Evaluation Patient Details Name: [name] MRN: [id number] DOB: [birth year] Today's Date: 08/20/2011 Time: 2130-8657 PT Time Calculation (min): 21 min  PT Assessment / Plan / Recommendation Clinical Impression  Patient is a 71 yo female very independent pta.  Patient is independent with all mobility and gait.  No acute PT needs identified.  PT will sign off.  Recommended patient continue to ambulate in hallway with nursing.    PT Assessment  Patent does not need any further PT services    Follow Up Recommendations  No PT follow up;Supervision - Intermittent    Barriers to Discharge        Equipment Recommendations  None recommended by PT    Recommendations for Other Services     Frequency      Precautions / Restrictions Precautions Precautions: None Restrictions Weight Bearing Restrictions: No         Mobility  Transfers Transfers: Sit to Stand;Stand to Sit Sit to Stand: 7: Independent;From bed Stand to Sit: 7: Independent;To bed Details for Transfer Assistance: No cues or assist needed for transfers Ambulation/Gait Ambulation/Gait Assistance: 7: Independent Ambulation Distance (Feet): 192 Feet Assistive device: None Ambulation/Gait Assistance Details: Patient with steady gait, though takes short steps.  Slight lateral flexion of trunk.  No assistive device or assistance required. Gait Pattern: Step-through pattern;Decreased stride length      PT Goals  N/A  Visit Information  Last PT Received On: 08/20/11 Assistance Needed: +1    Subjective Data  Subjective: "I think I'm doing OK with moving around.  I've always been active." Patient Stated Goal: To return home.   Prior Functioning  Home Living Lives With: Alone Available Help at Discharge: Family (cousin) Type of Home: House Home Access: Level entry Home Layout: One level Bathroom Shower/Tub: Network engineer: None Prior  Function Level of Independence: Independent Able to Take Stairs?: Yes Driving: Yes Vocation: Retired Musician: No difficulties Dominant Hand: Right    Cognition  Overall Cognitive Status: Appears within functional limits for tasks assessed/performed Arousal/Alertness: Awake/alert Orientation Level: Oriented X4 / Intact Behavior During Session: [facility] for tasks performed    Extremity/Trunk Assessment Right Upper Extremity Assessment RUE ROM/Strength/Tone: [facility] for tasks assessed Left Upper Extremity Assessment LUE ROM/Strength/Tone: WFL for tasks assessed Right Lower Extremity Assessment RLE ROM/Strength/Tone: WFL for tasks assessed (noted edema bil. LE's) Left Lower Extremity Assessment LLE ROM/Strength/Tone: [facility] for tasks assessed   Balance Balance Balance Assessed: Yes High Level Balance High Level Balance Activites: Direction changes;Turns;Head turns High Level Balance Comments: No loss of balance with above high level activities  End of Session PT - End of Session Activity Tolerance: Patient tolerated treatment well Patient left: in bed;with call bell/phone within reach (sitting on edge of bed) Nurse Communication: Mobility status (Continue to ambulate in hallway with nursing - No PT needs)  GP     Vena Austria 08/20/2011, 10:09 AM Durenda Hurt. Renaldo Fiddler, [facility] Acute Rehab Services Pager [phone]

## 2011-08-20 NOTE — Progress Notes (Signed)
Pts IV got partially pulled out and was bleeding.  Took IV out the rest of the way and paged IV team for a new IV.

## 2011-08-20 NOTE — Progress Notes (Signed)
24-hour urine started 08/19/11 at 2030 to end on 08/20/11 at 2030.  Hat placed in toilet and 24-hour container on ice.  Patient educated on not emptying hat.  Sign placed on door for staff.  Will continue to monitor.  [name]

## 2011-08-21 DIAGNOSIS — E876 Hypokalemia: Secondary | ICD-10-CM

## 2011-08-21 DIAGNOSIS — R609 Edema, unspecified: Secondary | ICD-10-CM

## 2011-08-21 DIAGNOSIS — R601 Generalized edema: Secondary | ICD-10-CM | POA: Diagnosis present

## 2011-08-21 DIAGNOSIS — I509 Heart failure, unspecified: Secondary | ICD-10-CM

## 2011-08-21 DIAGNOSIS — I5033 Acute on chronic diastolic (congestive) heart failure: Principal | ICD-10-CM

## 2011-08-21 LAB — BASIC METABOLIC PANEL
Calcium: 8.4 mg/dL (ref 8.4–10.5)
Creatinine, Ser: 0.95 mg/dL (ref 0.50–1.10)
GFR calc Af Amer: 68 mL/min — ABNORMAL LOW (ref >90)
GFR calc non Af Amer: 59 mL/min — ABNORMAL LOW (ref >90)
Sodium: 144 mEq/L (ref 135–145)

## 2011-08-21 MED ORDER — SPIRONOLACTONE 25 MG PO TABS
25.0000 mg | ORAL_TABLET | Freq: Every day | ORAL | Status: DC
  Administered 2011-08-22 – 2011-08-23 (×2): 25 mg via ORAL
  Filled 2011-08-21 (×2): qty 1

## 2011-08-21 MED ORDER — SODIUM CHLORIDE 0.9 % IV SOLN: 250.0000 mL | INTRAVENOUS | Status: DC | PRN

## 2011-08-21 MED ORDER — SODIUM CHLORIDE 0.9 % IJ SOLN: 3.0000 mL | INTRAMUSCULAR | Status: DC | PRN

## 2011-08-21 MED ORDER — LOSARTAN POTASSIUM 50 MG PO TABS
100.0000 mg | ORAL_TABLET | Freq: Every day | ORAL | Status: DC
  Administered 2011-08-22 – 2011-08-23 (×2): 100 mg via ORAL
  Filled 2011-08-21 (×2): qty 2

## 2011-08-21 MED ORDER — ASPIRIN 81 MG PO CHEW
324.0000 mg | CHEWABLE_TABLET | ORAL | Status: AC
  Administered 2011-08-22: 324 mg via ORAL
  Filled 2011-08-21: qty 4

## 2011-08-21 MED ORDER — SPIRONOLACTONE 25 MG PO TABS: 25.0000 mg | ORAL_TABLET | Freq: Every day | ORAL | Status: DC

## 2011-08-21 MED ORDER — SODIUM CHLORIDE 0.9 % IV SOLN: INTRAVENOUS | Status: DC

## 2011-08-21 MED ORDER — ASPIRIN EC 81 MG PO TBEC
81.0000 mg | DELAYED_RELEASE_TABLET | Freq: Every day | ORAL | Status: DC
  Administered 2011-08-23: 81 mg via ORAL
  Filled 2011-08-21: qty 1

## 2011-08-21 MED ORDER — SODIUM CHLORIDE 0.9 % IJ SOLN
3.0000 mL | Freq: Two times a day (BID) | INTRAMUSCULAR | Status: DC
  Administered 2011-08-22: 3 mL via INTRAVENOUS

## 2011-08-21 NOTE — Progress Notes (Signed)
Subjective:   Overnight mild orthopnea. Leg swelling continue to improve.  Saw Dr. Jacinto Halim on 6/19 and had echo at his office on 6/27. Reviewed office consult note-anasarca, rule out nephrotic range proteinuria, aortic incompetence. Was started on Lasix, beta blockers and was to followup with echo results-pending.  Objective  Vital signs in last 24 hours: Filed Vitals:   08/20/11 1314 08/20/11 2054 08/21/11 0439 08/21/11 1050  BP: 161/67 160/62 154/54 163/65  Pulse: 72 75 73 81  Temp: 97.2 F (36.2 C) 97.9 F (36.6 C) 97.3 F (36.3 C)   TempSrc: Oral Oral Oral   Resp: 18 20 18    Height:      Weight:   80.5 kg (177 lb 7.5 oz)   SpO2: 94% 94% 98%    Weight change: -1.828 kg (-4 lb 0.5 oz)  Intake/Output Summary (Last 24 hours) at 08/21/11 1241 Last data filed at 08/21/11 1050  Gross per 24 hour  Intake    483 ml  Output   2200 ml  Net  -1717 ml    Physical Exam:  General Exam: Comfortable.  Respiratory System: Slightly reduced breath sounds in the bases with occasional basal crackles. Rest of lung fields are clear to auscultation. No increased work of breathing.  Cardiovascular System: First and second heart sounds heard. Regular rate and rhythm. No JVD/murmurs. Telemetry shows sinus rhythm in the 60's-80's. Occasional asymptomatic sinus bradycardia in the high 30s. A runoff 8 beat PVCs-does not look like NSVT. Gastrointestinal System: Abdomen is non distended, soft and normal bowel sounds heard. Anterior abdominal wall edema-decreasing. Nontender.  Central Nervous System: Alert and oriented. No focal neurological deficits. Extremities: 3+ chronic pitting bilateral lower extremity edema extending up the anterior abdominal wall anteriorly and sacrum posteriorly-decreasing. No edema of upper extremities.  Labs:  Basic Metabolic Panel:  Lab 08/21/11 1610 08/20/11 0505 08/19/11 0512  NA 144 144 144  K 4.3 3.9 4.2  CL 105 105 107  CO2 29 30 29   GLUCOSE 95 97 116*  BUN 23 24*  21  CREATININE 0.95 1.02 0.89  CALCIUM 8.4 8.7 8.6  ALB -- -- --  PHOS -- -- --   Liver Function Tests:  Lab 08/17/11 2314  AST 26  ALT 23  ALKPHOS 62  BILITOT 1.0  PROT 6.3  ALBUMIN 3.2*   No results found for this basename: LIPASE:3,AMYLASE:3 in the last 168 hours No results found for this basename: AMMONIA:3 in the last 168 hours CBC:  Lab 08/18/11 0339 08/17/11 1944  WBC 4.6 5.5  NEUTROABS -- --  HGB 12.4 12.5  HCT 37.6 38.1  MCV 92.2 92.9  PLT 184 179   Cardiac Enzymes:  Lab 08/18/11 1802 08/18/11 0955 08/18/11 0339  CKTOTAL 40 47 55  CKMB 4.5* 4.3* 4.3*  CKMBINDEX -- -- --  TROPONINI <0.30 <0.30 <0.30   CBG: No results found for this basename: GLUCAP:5 in the last 168 hours  Iron Studies: No results found for this basename: IRON,TIBC,TRANSFERRIN,FERRITIN in the last 72 hours Studies/Results: Dg Chest 2 View  08/17/2011  *RADIOLOGY REPORT*  Clinical Data: Short of breath  CHEST - 2 VIEW  Comparison: None  Findings: Heart silhouette is enlarged.  There is a large left effusion and small right effusion.  Central venous congestion is present.  No pneumothorax.  IMPRESSION:  1.  Asymmetric pleural effusions, left greater than right. Recommend follow-up to ensure resolution as malignancy cannot be excluded. 2.  Cardiomegaly.  Original Report Authenticated By: Genevive Bi, M.D.  Medications:      . aspirin EC  81 mg Oral Daily  . enoxaparin  40 mg Subcutaneous Daily  . furosemide  80 mg Intravenous Q12H  . losartan  50 mg Oral Daily  . metoprolol tartrate  12.5 mg Oral BID  . potassium chloride  20 mEq Oral Daily  . sodium chloride  3 mL Intravenous Q12H    I  have reviewed scheduled and prn medications.     Problem/Plan: Principal Problem:  *CHF exacerbation Active Problems:  SOB (shortness of breath)  Pleural effusion, bilateral  Hypertension  Hypoxia  1. CHF exacerbation, possibly acute on chronic diastolic: ? Incomplete charting of I/O and  weight's. Discussed with Dr. Jacinto Halim: 2-D echo shows LVEF 45%, LVH, pulmonary hypertension and miniscule pericardial effusion. Improving but slowly. Requested him to see her in formal consultation. 2. Anasarca: Possibly secondary to problem #1. 24 hour urine protein: 288 mg.? Other causes 3. Hypokalemia: Repleted. Follow daily BMP 4. Pleural effusions: Possibly secondary to problem #1. Followup chest x-ray to ensure resolution. 5. Hypertension: We'll increase losartan. 6. Hypoxia: Secondary to CHF and pleural effusions. Treat above and monitor.  Disposition: Not medically ready for discharge.  Cristina Mattern 08/21/2011,12:41 PM  LOS: 4 days

## 2011-08-21 NOTE — Consult Note (Addendum)
CARDIOLOGY CONSULT NOTE  Patient ID: [name] MRN: [id number] DOB/AGE: Nov 20, 1940 71 y.o.  Admit date: 08/17/2011 Referring Physician Marcellus Scott, MD Primary Physician: No primary provider on file. Dr. Greggory Stallion Osei-Bonsu.  Reason for Consultation: CHF  HPI: [name] is a 71 year old African American female who is referred to me for evaluation of shortness of breath. [name] presented to the emergency complaining of marked worsening of dyspnea that occurred about 4-5 days ago. She states that until 3 months ago she was walking and also jogging for 3 miles a day.  But since last 2-3 months she is significantly reduced her activity due to shortness of breath and leg edema.  She also states that she was having performed orthopnea, increasing abdominal distention and worsening leg edema and presented to the hospital.  Since being in the hospital, patient states that she is symptomatically feeling much better.  Her abdominal distention has significantly improved but she states that overall she still continues to have significant extremity edema.  Last night she did sleep with 2 pillows and had an elevated. She denies any chest pain, palpitations, neurological deficits.  Past Medical History  Diagnosis Date  . Hypertension      History reviewed. No pertinent past surgical history.   No family history on file.  Social History: History   Social History  . Marital Status: Single    Spouse Name: N/A    Number of Children: N/A  . Years of Education: N/A   Occupational History  . Not on file.   Social History Main Topics  . Smoking status: Never Smoker   . Smokeless tobacco: Not on file  . Alcohol Use: No  . Drug Use: No  . Sexually Active:    Other Topics Concern  . Not on file   Social History Narrative  . No narrative on file     Prescriptions prior to admission  Medication Sig Dispense Refill  . aspirin 81 MG tablet Take 81 mg by mouth daily.      .  furosemide (LASIX) 40 MG tablet Take 40 mg by mouth daily.      Marland Kitchen losartan (COZAAR) 50 MG tablet Take 50 mg by mouth daily.      . metoprolol tartrate (LOPRESSOR) 25 MG tablet Take 25 mg by mouth 2 (two) times daily.      Marland Kitchen tiZANidine (ZANAFLEX) 4 MG tablet Take 4 mg by mouth every 6 (six) hours as needed. For pain        ROS: General: no fevers/chills/night sweats Eyes: no blurry vision, diplopia, or amaurosis ENT: no sore throat or hearing loss Resp: no cough, wheezing, or hemoptysis CV:Leg and arm  Edema present. GI: no abdominal pain, nausea, vomiting, diarrhea, or constipation. Has noted increasing distention over last 1 week especially. GU: no dysuria, frequency, or hematuria Skin: no rash Neuro: no headache, numbness, tingling, or weakness of extremities Musculoskeletal: no joint pain or swelling Heme: no bleeding, DVT, or easy bruising Endo: no polydipsia or polyuria    Physical Exam: Blood pressure 160/61, pulse 85, temperature 98.2 F (36.8 C), temperature source Oral, resp. rate 20, height 5\' 4"  (1.626 m), weight 80.5 kg (177 lb 7.5 oz), SpO2 98.00%.   Moderately built and normal bodily habitus, in no acute distress. Appears ill looking and appropriate for age. Alert Ox3 and in no acute distress.    HEENT: normal limits. PERRLA,   There is no cyanosis.   JVD present about 6 cm above  the clavicle.    Heart: PMI is shifted to the left anterior clavicular border. S1 normal, S2 muffled, No gallop present. III/IV early diastolic murmur in the right sternal border and apex.    CHEST EXAM: No tenderness of chest wall.  LUNGS: Rales are present bilaterally at bases.    ABDOMEN: No hepatosplenomegaly.  BS normal in all 4 quadrants. Abdomen is non-tender.    EXTREMITY: Warm, non tender Pitting edema bilateral above knee,  ankle 2-3 plus. Edema also noted in the arms bilateral. MUSCULOSKELETAL EXAM: Intact with full range of motion in all 4 extremities.    VASCULAR EXAM: No skin  breakdown. Carotids normal. Extremities: Femoral pulse normal, no bruit. Popliteal pulse normal ; Pedal pulse 1-2 plus right and 2 plus left.  No prominent pulse felt in the abdomen. No varicose veins. Labs:   Lab Results  Component Value Date   WBC 4.6 08/18/2011   HGB 12.4 08/18/2011   HCT 37.6 08/18/2011   MCV 92.2 08/18/2011   PLT 184 08/18/2011    Lab 08/21/11 0506 08/17/11 2314  NA 144 --  K 4.3 --  CL 105 --  CO2 29 --  BUN 23 --  CREATININE 0.95 --  CALCIUM 8.4 --  PROT -- 6.3  BILITOT -- 1.0  ALKPHOS -- 62  ALT -- 23  AST -- 26  GLUCOSE 95 --   Lab Results  Component Value Date   CKTOTAL 40 08/18/2011   CKMB 4.5* 08/18/2011   TROPONINI <0.30 08/18/2011    Lipid Panel  No results found for this basename: chol, trig, hdl, cholhdl, vldl, ldlcalc     Radiology: Left pleural effusion worse than the right. Mild central venous congestion.     EKG: NSR @ 71/,min. Normal intervals. Non specific T change. No ischemia.  ASSESSMENT AND PLAN:   1. Moderate to Severe aortic regurgitation by physical exam.  ECG 08/09/11: NSR @ 71/min. Left atrial enlargement. Anterior infarct, old. Diffuse ST-T non specific.  Heart: PMI is shifted to the left anterior clavicular border. S1 normal, S2 muffled, No gallop present. III/IV early diastolic murmur in the right sternal border and apex.   Echo 08/17/11: Mild LV dysfunction, EF 45%, severe LAE, Mod AI, Mild TR, mod pul HTN. Lt Pl effusion, ascites noted.  2. Generalised anasarch with 3 plus edema throughout. Still has upper extremity edema. Proteinurea not consistent with nephrotic syndrome. Recent weight change-has gained 40 lbs. in the past 2 months from fluid/swelling. Labs done on 06/01/2011 reveals normal CMP.  CBC was within normal limits.  Urinalysis revealed macro albuminurea with 3+ protein.Total cholesterol was 120, triglycerides 69, HDL 52, LDL was 54. HbA1c 6.1 and TSH normal. 3. PAD asymptomatic and mild. Mildly decreased pedal  pulse amplitude right compared to left. Intact femoral and popliteal pulse.  Lower extremity arterial duplex that was done on 06/01/2011 reveals a right ABI of 0.86 and left ABI of 1.08.   4. Hyperglycemia. 5. Hypertension not at goal. BP 170/60 mm Hg.  Rec: I am perplexed by anasarca.  I will add Aldactone 25 mg 1 by mouth every morning to present medical regimen.  I will request radiology to do ultrasound guided thoracentesis of left pleural effusion. I will also consider left and right heart catheterization to evaluate for severity of her aortic regurgitation and to evaluate LV systolic dysfunction and coronary artery disease.  However patient still has significant orthopnea and I will await further diuresis.  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 I would also obtain ANA, sedimentation rate to exclude connective tissue disorders.  I will continue to follow the patient and make further recommendations. She certainly could have had nephrotic syndrome and he may be catching her at the end of her clinical course of nephrotic syndrome.   Pamella Pert, MD 08/21/2011, 10:04 PM  d

## 2011-08-21 NOTE — Progress Notes (Signed)
OT Note: Pt. Screen completed due to pt. Functioning at baseline with ADLs and mobility at this time. Will sign off acutely. Thanks!  Cassandria Anger, OTR/L Pager: [phone] 08/21/2011 .

## 2011-08-22 ENCOUNTER — Inpatient Hospital Stay (HOSPITAL_COMMUNITY): Payer: Medicare Other

## 2011-08-22 DIAGNOSIS — J90 Pleural effusion, not elsewhere classified: Secondary | ICD-10-CM

## 2011-08-22 LAB — PROTIME-INR: INR: 1.23 (ref 0.00–1.49)

## 2011-08-22 LAB — BASIC METABOLIC PANEL
Calcium: 9.3 mg/dL (ref 8.4–10.5)
GFR calc Af Amer: 76 mL/min — ABNORMAL LOW (ref >90)
GFR calc non Af Amer: 65 mL/min — ABNORMAL LOW (ref >90)
Glucose, Bld: 101 mg/dL — ABNORMAL HIGH (ref 70–99)
Potassium: 3.7 mEq/L (ref 3.5–5.1)
Sodium: 145 mEq/L (ref 135–145)

## 2011-08-22 LAB — BODY FLUID CELL COUNT WITH DIFFERENTIAL
Lymphs, Fluid: 60 %
Neutrophil Count, Fluid: 27 % — ABNORMAL HIGH (ref 0–25)

## 2011-08-22 LAB — LIPID PANEL
LDL Cholesterol: 60 mg/dL (ref 0–99)
VLDL: 14 mg/dL (ref 0–40)

## 2011-08-22 LAB — LACTATE DEHYDROGENASE, PLEURAL OR PERITONEAL FLUID: LD, Fluid: 68 U/L — ABNORMAL HIGH (ref 3–23)

## 2011-08-22 LAB — PRO B NATRIURETIC PEPTIDE: Pro B Natriuretic peptide (BNP): 28362 pg/mL — ABNORMAL HIGH (ref 0–125)

## 2011-08-22 NOTE — Progress Notes (Addendum)
Subjective:  Patient has started to feel better. Still continues to have mild orthopnea and cough when she lays down. No chest pain, palpitations. No hemoptysis. Her leg edema and arm edema has improved and states that her abdomen feels much softer. Tolerating all the medications.  Objective:  Vital Signs in the last 24 hours: Temp:  [98.2 F (36.8 C)] 98.2 F (36.8 C) (07/02 0540) Pulse Rate:  [75-85] 82  (07/02 0943) Resp:  [20] 20  (07/02 0540) BP: (132-165)/(47-62) 155/62 mmHg (07/02 0943) SpO2:  [98 %] 98 % (07/02 0540) Weight:  [79.1 kg (174 lb 6.1 oz)-79.107 kg (174 lb 6.4 oz)] 79.1 kg (174 lb 6.1 oz) (07/02 0540)  Intake/Output from previous day: 07/01 0701 - 07/02 0700 In: 1303 [P.O.:1300; I.V.:3] Out: 1975 [Urine:1975]  Physical Exam:  Moderately built and normal bodily habitus, in no acute distress. Appears appropriate for age. Alert Ox3 and in no acute distress.  HEENT: normal limits. PERRLA, There is no cyanosis.  JVD present about 6-8 cm above the clavicle.  Heart: PMI is shifted to the left anterior clavicular border. S1 normal, S2 muffled, No gallop present. III/IV early diastolic murmur in the right sternal border and apex.  CHEST EXAM: No tenderness of chest wall. LUNGS: Rales are present bilaterally at bases.  ABDOMEN: No hepatosplenomegaly. BS normal in all 4 quadrants. Abdomen is non-tender.  EXTREMITY: Warm, non tender Pitting edema bilateral above knee, ankle 2-3 plus. Edema also noted in the arms bilateral. MUSCULOSKELETAL EXAM: Intact with full range of motion in all 4 extremities.  VASCULAR EXAM: No skin breakdown. Carotids normal. Extremities: Femoral pulse normal, no bruit. Popliteal pulse normal ; Pedal pulse 1-2 plus right and 2 plus left. No prominent pulse felt in the abdomen. No varicose veins.   Lab Results: No results found for this basename: WBC:2,HGB:2,PLT:2 in the last 72 hours  Basename 08/22/11 0635 08/21/11 0506  NA 145 144  K 3.7 4.3  CL  101 105  CO2 33* 29  GLUCOSE 101* 95  BUN 24* 23  CREATININE 0.87 0.95    Cardiac Studies: I also reviewed the telemetry strips from today's in the and this reveals a very brief episode of nonsustained ventricular tachycardia at a rate of 100 beats per minute. There were about 10 beats run. No recurrence since then.  Assessment/Plan:  1. Acute on chronic diastolic heart failure. 2. Generalized anasarca and edema. I do not know the etiology. Ascites Praecox with constrictive pericarditis could potentially explain her presentation. 3. Hypertension 4. Moderate to severe regurgitation by physical examination. Moderate aortic regurgitation by recent echocardiogram. 5. Hyperglycemia without diagnosis of diabetes mellitus.  Recommendation: I have ordered left pleurocentesis under ultrasound guidance by radiology today. I may consider left and right heart catheterization in the morning or we can potentially discharge her with mild continued diuresis and an elective right and left heart catheterization in 1-2 weeks. She is tolerating Aldactone and has had significant diuresis last night. I'll follow up on the pleurocentesis reports today and depending on that we'll consider left and right heart catheterization in the morning or at elective time.   Pamella Pert, M.D. 08/22/2011, 12:13 PM  I have discussed the thoracentesis with radiology physician assistant.  I have sent the fluid for lab evaluation.  At this point I would recommend that we discharge the patient in the morning if she remains stable and I will set her up for a elective right and left heart catheterization in 1-2 weeks depending on  her clinical status.

## 2011-08-22 NOTE — Progress Notes (Signed)
Interval history:  71 year old African American female patient with history of hypertension was admitted on 6/27 with 2-3 months history of progressively worsening dyspnea and generalized body swelling especially her legs. She recently saw the cardiologist as an outpatient and was initiated on diuretics and an echo was done. Due to worsening symptoms she was admitted to the hospital. She is being treated for acute on chronic diastolic heart failure with IV diuretics which is slowly improving. Etiology of her anasarca is not clear. 24-hour urine does not show nephrotic range proteinuria. Cardiology consulted and getting left paracentesis.   Subjective:   Patient complains of some orthopnea, dyspnea on exertion but no chest pain or cough. Still with significant leg swellings but slowly improving.  Objective  Vital signs in last 24 hours: Filed Vitals:   08/21/11 1407 08/21/11 2122 08/22/11 0532 08/22/11 0540  BP: 132/47 160/61  165/62  Pulse: 75 85  83  Temp: 98.2 F (36.8 C) 98.2 F (36.8 C)  98.2 F (36.8 C)  TempSrc: Oral Oral  Oral  Resp: 20   20  Height:      Weight:   79.107 kg (174 lb 6.4 oz) 79.1 kg (174 lb 6.1 oz)  SpO2: 98% 98%  98%   Weight change: -1.393 kg (-3 lb 1.1 oz)  Intake/Output Summary (Last 24 hours) at 08/22/11 0921 Last data filed at 08/22/11 0543  Gross per 24 hour  Intake   1063 ml  Output   1225 ml  Net   -162 ml    Physical Exam:  General Exam: Comfortable.  Respiratory System:reduced breath sounds in the bases. Rest of lung fields are clear to auscultation. No increased work of breathing.  Cardiovascular System: First and second heart sounds heard. Regular rate and rhythm. No JVD/murmurs. Telemetry shows sinus rhythm. Gastrointestinal System: Abdomen is non distended, soft and normal bowel sounds heard. Anterior abdominal wall edema-decreasing. Nontender.  Central Nervous System: Alert and oriented. No focal neurological deficits. Extremities: 3+  chronic pitting bilateral lower extremity edema extending up the anterior abdominal wall anteriorly and sacrum posteriorly-decreasing. No edema of upper extremities.  Labs:  Basic Metabolic Panel:  Lab 08/22/11 1610 08/21/11 0506 08/20/11 0505  NA 145 144 144  K 3.7 4.3 3.9  CL 101 105 105  CO2 33* 29 30  GLUCOSE 101* 95 97  BUN 24* 23 24*  CREATININE 0.87 0.95 1.02  CALCIUM 9.3 8.4 8.7  ALB -- -- --  PHOS -- -- --   Liver Function Tests:  Lab 08/17/11 2314  AST 26  ALT 23  ALKPHOS 62  BILITOT 1.0  PROT 6.3  ALBUMIN 3.2*   No results found for this basename: LIPASE:3,AMYLASE:3 in the last 168 hours No results found for this basename: AMMONIA:3 in the last 168 hours CBC:  Lab 08/18/11 0339 08/17/11 1944  WBC 4.6 5.5  NEUTROABS -- --  HGB 12.4 12.5  HCT 37.6 38.1  MCV 92.2 92.9  PLT 184 179   Cardiac Enzymes:  Lab 08/18/11 1802 08/18/11 0955 08/18/11 0339  CKTOTAL 40 47 55  CKMB 4.5* 4.3* 4.3*  CKMBINDEX -- -- --  TROPONINI <0.30 <0.30 <0.30   CBG: No results found for this basename: GLUCAP:5 in the last 168 hours  Iron Studies: No results found for this basename: IRON,TIBC,TRANSFERRIN,FERRITIN in the last 72 hours Studies/Results: Dg Chest 2 View  08/17/2011  *RADIOLOGY REPORT*  Clinical Data: Short of breath  CHEST - 2 VIEW  Comparison: None  Findings: Heart silhouette  is enlarged.  There is a large left effusion and small right effusion.  Central venous congestion is present.  No pneumothorax.  IMPRESSION:  1.  Asymmetric pleural effusions, left greater than right. Recommend follow-up to ensure resolution as malignancy cannot be excluded. 2.  Cardiomegaly.  Original Report Authenticated By: Genevive Bi, M.D.   Medications:    . sodium chloride        . aspirin  324 mg Oral Pre-Cath  . aspirin EC  81 mg Oral Daily  . enoxaparin  40 mg Subcutaneous Daily  . furosemide  80 mg Intravenous Q12H  . losartan  100 mg Oral Daily  . metoprolol tartrate   12.5 mg Oral BID  . potassium chloride  20 mEq Oral Daily  . sodium chloride  3 mL Intravenous Q12H  . sodium chloride  3 mL Intravenous Q12H  . spironolactone  25 mg Oral Daily  . DISCONTD: aspirin EC  81 mg Oral Daily  . DISCONTD: losartan  50 mg Oral Daily  . DISCONTD: spironolactone  25 mg Oral Daily    I  have reviewed scheduled and prn medications.     Problem/Plan: Principal Problem:  *Diastolic CHF, acute on chronic Active Problems:  SOB (shortness of breath)  Pleural effusion, bilateral  Hypertension  Hypoxia  Anasarca  Hypokalemia  1. CHF exacerbation, acute on chronic diastolic: Dr. Jacinto Halim input appreciated. Continue IV diuresis and Aldactone. For elective cardiac catheterization as outpatient.  2. Anasarca: Etiology not clear. Cardiology evaluating. 3. Hypokalemia: Repleted.  4. Pleural effusions: For diagnostic and therapeutic left thoracentesis today. 5. Hypertension: Losartan increased. Still mildly elevated. 6. Hypoxia: Secondary to CHF and pleural effusions. Treat above and monitor. 7. Moderate to severe aortic regurgitation. 8. NSVT. Continue low-dose beta blockers.  Disposition: Possible discharge on 7/3.  Elfriede Bonini 08/22/2011,9:21 AM  LOS: 5 days

## 2011-08-22 NOTE — Procedures (Signed)
US guided left thora 280 cc yellow fluid  Pt tolerated well Sent for labs

## 2011-08-23 DIAGNOSIS — I10 Essential (primary) hypertension: Secondary | ICD-10-CM

## 2011-08-23 LAB — ANA: Anti Nuclear Antibody(ANA): NEGATIVE

## 2011-08-23 MED ORDER — SPIRONOLACTONE 25 MG PO TABS: 25.0000 mg | ORAL_TABLET | Freq: Every day | ORAL | Status: DC

## 2011-08-23 MED ORDER — LOSARTAN POTASSIUM 50 MG PO TABS: 100.0000 mg | ORAL_TABLET | Freq: Every day | ORAL | Status: DC

## 2011-08-23 MED ORDER — FUROSEMIDE 40 MG PO TABS
40.0000 mg | ORAL_TABLET | Freq: Two times a day (BID) | ORAL | Status: DC
  Administered 2011-08-23: 40 mg via ORAL
  Filled 2011-08-23 (×3): qty 1

## 2011-08-23 MED ORDER — POTASSIUM CHLORIDE CRYS ER 20 MEQ PO TBCR: 20.0000 meq | EXTENDED_RELEASE_TABLET | Freq: Every day | ORAL | Status: DC

## 2011-08-23 MED ORDER — FUROSEMIDE 80 MG PO TABS: 80.0000 mg | ORAL_TABLET | Freq: Two times a day (BID) | ORAL | Status: DC

## 2011-08-23 MED ORDER — FUROSEMIDE 40 MG PO TABS: 40.0000 mg | ORAL_TABLET | Freq: Two times a day (BID) | ORAL | Status: DC

## 2011-08-23 MED ORDER — METOPROLOL TARTRATE 25 MG PO TABS: 12.5000 mg | ORAL_TABLET | Freq: Two times a day (BID) | ORAL | Status: DC

## 2011-08-23 NOTE — Progress Notes (Signed)
Subjective:  Patient feels remarkably improved. She states that she is able to breathe better. Slept well last night. Tolerating all the medications.  Objective:  Vital Signs in the last 24 hours: Temp:  [97.7 F (36.5 C)-98.3 F (36.8 C)] 98.3 F (36.8 C) (07/03 0635) Pulse Rate:  [77-88] 86  (07/03 0635) Resp:  [18-20] 20  (07/03 0635) BP: (148-168)/(43-67) 161/54 mmHg (07/03 0635) SpO2:  [94 %-98 %] 94 % (07/03 0635) Weight:  [78.155 kg (172 lb 4.8 oz)] 78.155 kg (172 lb 4.8 oz) (07/03 0635)  Intake/Output from previous day: 07/02 0701 - 07/03 0700 In: 1369 [P.O.:1320; I.V.:9; IV Piggyback:40] Out: 2500 [Urine:2500]  Physical Exam: Moderately built and normal bodily habitus, in no acute distress. Appears appropriate for age. Alert Ox3 and in no acute distress.  HEENT: normal limits. PERRLA, There is no cyanosis.  JVD present about 6-8 cm above the clavicle.  Heart: PMI is shifted to the left anterior clavicular border. S1 normal, S2 muffled, No gallop present. III/IV early diastolic murmur in the right sternal border and apex.  CHEST EXAM: No tenderness of chest wall. LUNGS: Rales are present bilaterally at bases.  ABDOMEN: No hepatosplenomegaly. BS normal in all 4 quadrants. Abdomen is non-tender.  EXTREMITY: Warm, non tender Pitting edema bilateral above knee, ankle 2-3 plus. Edema also noted in the arms bilateral. Legs are very firm and skin is Peu-de-Orange appearance.    MUSCULOSKELETAL EXAM: Intact with full range of motion in all 4 extremities.  VASCULAR EXAM: No skin breakdown. Carotids normal. Extremities: Femoral pulse normal, no bruit. Popliteal pulse normal ; Pedal pulse 1-2 plus right and 2 plus left. No prominent pulse felt in the abdomen. No varicose veins.     Lab Results: No results found for this basename: WBC:2,HGB:2,PLT:2 in the last 72 hours  Basename 08/22/11 0635 08/21/11 0506  NA 145 144  K 3.7 4.3  CL 101 105  CO2 33* 29  GLUCOSE 101* 95  BUN 24*  23  CREATININE 0.87 0.95   No results found for this basename: TROPONINI:2,CK,MB:2 in the last 72 hours Hepatic Function Panel No results found for this basename: PROT,ALBUMIN,AST,ALT,ALKPHOS,BILITOT,BILIDIR,IBILI in the last 72 hours  Basename 08/22/11 0635  CHOL 141   No results found for this basename: PROTIME in the last 72 hours  Imaging: Dg Chest 1 View  08/22/2011  *RADIOLOGY REPORT*  Clinical Data: Status post left thoracentesis.  CHEST - 1 VIEW  Comparison: 08/22/2011  Findings: Cardiac silhouette is enlarged.  There are bilateral pleural effusions, left greater than right.  Left effusion has decreased since previous study.  No evidence for pneumothorax.  IMPRESSION:  1.  No evidence for pneumothorax following thoracentesis. 2.  Bilateral pleural effusions. 3.  Cardiomegaly.  Original Report Authenticated By: ELIZABETH D. BROWN, M.D.   Dg Chest 2 View  08/22/2011  *RADIOLOGY REPORT*  Clinical Data: Follow-up pleural effusion  CHEST - 2 VIEW  Comparison: Chest radiograph 08/17/2011  Findings: Cardiopericardial silhouette is moderately enlarged. There is central pulmonary vascular congestion.  Small rounded densities over the chest bilaterally have appearances consistent with attachments for cardiac leads.  There is no pulmonary edema. There are moderate to large left and small right pleural effusions. The pleural effusions appear similar to versus slightly decreased compared to 08/17/2011.  A left basilar opacity is noted.  In the setting of an effusion, this is most likely atelectasis, however airspace disease cannot be excluded.  IMPRESSION:  1. Left greater than right pleural effusions appear   similar to slightly decreased compared to the recent study. A left basilar opacity persists.  Continued radiographic follow-up to resolution is suggested. 2.  Moderate enlargement of the cardiopericardial silhouette. A pericardial effusion cannot be excluded.  Original Report Authenticated By: SUSAN  TURNER, M.D.    Cardiac Studies:  Assessment/Plan:  1. Generalized anasarca, etiology remains unsure. I also reviewed the chest x-ray findings and there is suggestion of pericardial effusion. Hence constrictive pericarditis is unlikely as it was in my differential diagnosis previously dictated by me. 2. Evaluation of pleural effusion reveals presence of transudate. 3. Moderate aortic regurgitation.  Recommendation: She is responded well to spironolactone and Lasix. Continue same I will see her back in the office in one week. I may consider left and right heart catheterization. Repeat chest x-ray in about a week to exclude any underlying mass needs to be performed. She also needs lower extremity support stockings knee-high before discharge. To keep the foot elevated while she is sitting or sleeping at night. Again to avoid salt and excessive fluids was discussed with the patient. I thank you for involving me in the care of the patient. She can be discharged home from cardiac today.   Deandria Klute,JAGADEESH R, M.D. 08/23/2011, 8:30 AM  

## 2011-08-23 NOTE — Progress Notes (Signed)
HOME HEALTH AGENCIES SERVING [place]   Agencies that are Medicare-Certified and are affiliated with [facility]  Telephone Number Address  [facility]   [facility] has ownership interest in this company; however, you are under no obligation to use this agency. [phone] or  [phone] [address] [place], [place] [address]   Agencies that are Medicare-Certified and are not affiliated with [facility] Agency Telephone Number Address  [facility] [phone] Fax [phone] [address] [place], [place]  [address]  [facility] [phone] or [phone] Fax [phone] [address] [place], [place] [address]  [facility] [phone] Fax [phone] [address] [place], [place] [address]  [facility] [phone] Fax [phone] [address] [place], [place]  [address]  [facility] [phone] Fax [phone] [address], Suite [place], [place] [address]  [facility] [phone] [address] [place], [place] [address]  [facility] [phone]  [address] [place], [place] [address]  [facility] [phone] or [phone] Fax [phone] [address] [place], [place]  [address]  [facility] [phone] Fax [phone] [address] [place], [place]  [address]  [facility]  [phone] Fax [phone] [address] [place], [place] [address]               Agencies that are not Medicare-Certified and are not affiliated with [facility] Agency Telephone Number Address  [facility] [place] [phone] or [phone] Fax [phone] [address] [place]  [address]  [facility] [phone] Fax [phone] [address] [place], [place]  [address]  [facility]  [phone] Fax [phone] [address] [place], [place] [address]  [facility] [phone] Fax [phone] [address] [place], [place] [address]  [facility] [phone] or [phone] Fax [phone] [address] [place], [place]  [address]  [facility] [phone] or [phone] Fax [phone] [address] [place], [place]  [address]  [facility] [phone] Fax [phone] [address] [place], [place]  [address]  [facility] [phone] [address] [place], [place]  [address]  [facility] [phone] Fax [phone] [address] [place], [place]  [address]  [facility] [phone] Fax [phone] [address] [place], [place]  [address]  [facility] [phone] Fax [phone] [address] [place], [place] [address]  [facility] [phone] Fax [phone] [address] [place], [place]  [address]   In to see patient to offer choice of home health agencies. [facility]. Appropriate referral will be  made.

## 2011-08-23 NOTE — Progress Notes (Signed)
DC IV, DC Tele, DC Home. Discharge instructions and home medications discussed with patient and patient's family. Patient denies any questions or concerns at this time. Patient leaving unit via wheelchair and appears in no acute distress. 

## 2011-08-23 NOTE — Discharge Summary (Signed)
Physician Discharge Summary  Patient ID: [name] MRN: [id number] DOB/AGE: 04-29-1940 71 y.o.  Admit date: 08/17/2011 Discharge date: 08/23/2011  Primary Care Physician:  No primary provider on file.  Discharge Diagnoses:    .Diastolic CHF, acute on chronic: Significantly improved  .SOB (shortness of breath) improving  .Pleural effusion, bilateral status post paracentesis  .Hypertension .Hypoxia .Anasarca improving, status post thoracentesis and paracentesis  .Hypokalemia improved  Consults:  Cardiology, Dr. Jacinto Halim   Discharge Medications: Medication List  As of 08/23/2011  9:37 AM   TAKE these medications         aspirin 81 MG tablet   Take 81 mg by mouth daily.      furosemide 40 MG tablet   Commonly known as: LASIX   Take 1 tablet (40 mg total) by mouth 2 (two) times daily.      losartan 50 MG tablet   Commonly known as: COZAAR   Take 2 tablets (100 mg total) by mouth daily.      metoprolol tartrate 25 MG tablet   Commonly known as: LOPRESSOR   Take 0.5 tablets (12.5 mg total) by mouth 2 (two) times daily.      potassium chloride SA 20 MEQ tablet   Commonly known as: K-DUR,KLOR-CON   Take 1 tablet (20 mEq total) by mouth daily.      spironolactone 25 MG tablet   Commonly known as: ALDACTONE   Take 1 tablet (25 mg total) by mouth daily.      tiZANidine 4 MG tablet   Commonly known as: ZANAFLEX   Take 4 mg by mouth every 6 (six) hours as needed. For pain             Brief H and P: For complete details please refer to admission H and P, but in brief patient is a 71 year old female with newly diagnosed to CHF a couple of months ago, who was new on Lasix also in the last several months, with presented with worsening shortness of breath and lower extremity edema. Patient admitted to having some orthopnea however denied any chest pain, nausea or vomiting. She denied any fevers or cough.   Hospital Course:  71 year old African American female patient with  history of hypertension was admitted on 6/27 with 2-3 months history of progressively worsening dyspnea and generalized body swelling especially her legs. She recently saw the cardiologist as an outpatient and was initiated on diuretics and an echo was done. Due to worsening symptoms she was admitted to the hospital. She was treated for acute on chronic diastolic heart failure with IV diuretics which is slowly improving. Etiology of her anasarca is not completely clear. 24-hour urine does not show nephrotic range proteinuria. Cardiology was consulted and patient was followed closely by Dr. Jacinto Halim, patient was placed on IV Lasix and Aldactone was added. Patient had significant improvement in her symptoms. She had outpatient echocardiogram done on 08/17/2011 which showed mild LV dysfunction, EF of 45%, severe LAE, Mod AI, Mild TR, mod pul HTN. Lt Pl effusion, ascites.  Patient underwent an ultrasound-guided thoracentesis and paracentesis. Patient had 280 cc of pleural fluid appeared to be transudative. Final cultures are both pending from thoracentesis and paracentesis, however has not grown so far anything. Per cardiology, patient is cleared to be discharged home on oral diuretics. She will need a repeat chest x-ray, follow up in one week with Dr. Shon Baton and to consider left and right heart catheterization.   Day of Discharge BP 161/54  Pulse  86  Temp 98.3 F (36.8 C) (Oral)  Resp 20  Ht 5\' 4"  (1.626 m)  Wt 78.155 kg (172 lb 4.8 oz)  BMI 29.58 kg/m2  SpO2 94%  Physical Exam: General: Alert and awake oriented x3 not in any acute distress. HEENT: anicteric sclera, pupils reactive to light and accommodation CVS: S1-S2 clear no murmur rubs or gallops Chest: Bibasilar crackles  Abdomen: soft nontender, nondistended, normal bowel sounds, no organomegaly Extremities: no cyanosis, clubbing. 2 + edema noted bilaterally Neuro: Cranial nerves II-XII intact, no focal neurological deficits   The results of  significant diagnostics from this hospitalization (including imaging, microbiology, ancillary and laboratory) are listed below for reference.    LAB RESULTS: Basic Metabolic Panel:  Lab 08/22/11 1610 08/21/11 0506  NA 145 144  K 3.7 4.3  CL 101 105  CO2 33* 29  GLUCOSE 101* 95  BUN 24* 23  CREATININE 0.87 0.95  CALCIUM 9.3 8.4  MG -- --  PHOS -- --   Liver Function Tests:  Lab 08/17/11 2314  AST 26  ALT 23  ALKPHOS 62  BILITOT 1.0  PROT 6.3  ALBUMIN 3.2*   CBC:  Lab 08/18/11 0339 08/17/11 1944  WBC 4.6 5.5  NEUTROABS -- --  HGB 12.4 12.5  HCT 37.6 38.1  MCV 92.2 --  PLT 184 179   Cardiac Enzymes:  Lab 08/18/11 1802 08/18/11 0955  CKTOTAL 40 47  CKMB 4.5* 4.3*  CKMBINDEX -- --  TROPONINI <0.30 <0.30    Significant Diagnostic Studies:  No results found.   Disposition and Follow-up: Discharge Orders    Future Orders Please Complete By Expires   Diet - low sodium heart healthy      Increase activity slowly      (HEART FAILURE PATIENTS) Call MD:  Anytime you have any of the following symptoms: 1) 3 pound weight gain in 24 hours or 5 pounds in 1 week 2) shortness of breath, with or without a dry hacking cough 3) swelling in the hands, feet or stomach 4) if you have to sleep on extra pillows at night in order to breathe.      Discharge instructions      Comments:   Fluid restriction 1500 cc/24hours. Please continue to wear [facility] support stockings while in bed.       DISPOSITION: Home with home health PT, RN arranged DIET: Low salt diet, fluid restriction 1500 cc/24 hours  ACTIVITY: As tolerated  DISCHARGE FOLLOW-UP Follow-up Information    Follow up with Pamella Pert, MD. Schedule an appointment as soon as possible for a visit in 1 week. (please follow-up in 1 week)    Contact information:   [address]  [place] [address] [phone]          Time spent on Discharge: 40  minutes  Signed:   RAI,RIPUDEEP M.D. [facility] 08/23/2011, 9:37 AM Pager: [phone]  If 7PM-7AM, please contact night-coverage [email or web address] Password [email or web address]

## 2011-08-26 LAB — BODY FLUID CULTURE: Culture: NO GROWTH

## 2011-09-01 ENCOUNTER — Encounter (HOSPITAL_COMMUNITY): Payer: Self-pay | Admitting: Pharmacy Technician

## 2011-09-11 ENCOUNTER — Encounter (HOSPITAL_COMMUNITY)
Admission: RE | Disposition: A | Discharge status: Home / Self Care | Payer: Self-pay | Source: Ambulatory Visit | Attending: Cardiology

## 2011-09-11 ENCOUNTER — Ambulatory Visit (HOSPITAL_COMMUNITY)
Admission: RE | Admit: 2011-09-11 | Discharge: 2011-09-11 | Disposition: A | Discharge status: Home / Self Care | Payer: Medicare Other | Source: Ambulatory Visit | Attending: Cardiology | Admitting: Cardiology

## 2011-09-11 DIAGNOSIS — R9439 Abnormal result of other cardiovascular function study: Secondary | ICD-10-CM | POA: Insufficient documentation

## 2011-09-11 DIAGNOSIS — I359 Nonrheumatic aortic valve disorder, unspecified: Secondary | ICD-10-CM | POA: Insufficient documentation

## 2011-09-11 DIAGNOSIS — I2789 Other specified pulmonary heart diseases: Secondary | ICD-10-CM | POA: Insufficient documentation

## 2011-09-11 HISTORY — PX: LEFT AND RIGHT HEART CATHETERIZATION WITH CORONARY ANGIOGRAM: SHX5449

## 2011-09-11 LAB — POCT I-STAT 3, VENOUS BLOOD GAS (G3P V)
O2 Saturation: 48 %
O2 Saturation: 55 %
TCO2: 29 mmol/L (ref 0–100)
TCO2: 28 mmol/L (ref 0–100)
pH, Ven: 7.42 — ABNORMAL HIGH (ref 7.250–7.300)
pH, Ven: 7.426 — ABNORMAL HIGH (ref 7.250–7.300)

## 2011-09-11 LAB — BASIC METABOLIC PANEL
Calcium: 9.3 mg/dL (ref 8.4–10.5)
GFR calc Af Amer: 60 mL/min — ABNORMAL LOW (ref >90)
GFR calc non Af Amer: 52 mL/min — ABNORMAL LOW (ref >90)
Glucose, Bld: 106 mg/dL — ABNORMAL HIGH (ref 70–99)
Sodium: 141 mEq/L (ref 135–145)

## 2011-09-11 LAB — CBC
MCH: 31.1 pg (ref 26.0–34.0)
MCHC: 33.1 g/dL (ref 30.0–36.0)
Platelets: 176 10*3/uL (ref 150–400)
RDW: 17.4 % — ABNORMAL HIGH (ref 11.5–15.5)

## 2011-09-11 LAB — PROTIME-INR
INR: 1.27 (ref 0.00–1.49)
Prothrombin Time: 16.2 seconds — ABNORMAL HIGH (ref 11.6–15.2)

## 2011-09-11 LAB — POCT I-STAT 3, ART BLOOD GAS (G3+)
Acid-Base Excess: 2 mmol/L (ref 0.0–2.0)
Bicarbonate: 25.4 mEq/L — ABNORMAL HIGH (ref 20.0–24.0)

## 2011-09-11 SURGERY — LEFT AND RIGHT HEART CATHETERIZATION WITH CORONARY ANGIOGRAM
Anesthesia: LOCAL

## 2011-09-11 MED ORDER — HEPARIN (PORCINE) IN NACL 2-0.9 UNIT/ML-% IJ SOLN
INTRAMUSCULAR | Status: AC
  Filled 2011-09-11: qty 1000

## 2011-09-11 MED ORDER — LIDOCAINE HCL (PF) 1 % IJ SOLN
INTRAMUSCULAR | Status: AC
  Filled 2011-09-11: qty 30

## 2011-09-11 MED ORDER — SODIUM CHLORIDE 0.9 % IV SOLN: 250.0000 mL | INTRAVENOUS | Status: DC | PRN

## 2011-09-11 MED ORDER — SODIUM CHLORIDE 0.9 % IV SOLN
INTRAVENOUS | Status: DC
  Administered 2011-09-11: 1000 mL via INTRAVENOUS

## 2011-09-11 MED ORDER — ASPIRIN 81 MG PO CHEW
324.0000 mg | CHEWABLE_TABLET | ORAL | Status: AC
  Administered 2011-09-11: 324 mg via ORAL

## 2011-09-11 MED ORDER — MIDAZOLAM HCL 2 MG/2ML IJ SOLN
INTRAMUSCULAR | Status: AC
  Filled 2011-09-11: qty 2

## 2011-09-11 MED ORDER — SODIUM CHLORIDE 0.9 % IJ SOLN: 3.0000 mL | Freq: Two times a day (BID) | INTRAMUSCULAR | Status: DC

## 2011-09-11 MED ORDER — SODIUM CHLORIDE 0.9 % IJ SOLN: 3.0000 mL | INTRAMUSCULAR | Status: DC | PRN

## 2011-09-11 MED ORDER — FENTANYL CITRATE 0.05 MG/ML IJ SOLN
INTRAMUSCULAR | Status: AC
  Filled 2011-09-11: qty 2

## 2011-09-11 MED ORDER — NITROGLYCERIN 0.2 MG/ML ON CALL CATH LAB
INTRAVENOUS | Status: AC
  Filled 2011-09-11: qty 1

## 2011-09-11 MED ORDER — ASPIRIN 81 MG PO CHEW
CHEWABLE_TABLET | ORAL | Status: AC
  Filled 2011-09-11: qty 4

## 2011-09-11 NOTE — Interval H&P Note (Signed)
History and Physical Interval Note:  09/11/2011 7:42 AM  [name]  has presented today for surgery, with the diagnosis of chf  The various methods of treatment have been discussed with the patient and family. After consideration of risks, benefits and other options for treatment, the patient has consented to  Procedure(s) (LRB): LEFT AND RIGHT HEART CATHETERIZATION WITH CORONARY ANGIOGRAM (N/A) and possible angioplasty as a surgical intervention .  The patient's history has been reviewed, patient examined, no change in status, stable for surgery.  I have reviewed the patient's chart and labs.  Questions were answered to the patient's satisfaction.     Pamella Pert

## 2011-09-11 NOTE — H&P (View-Only) (Signed)
Subjective:  Patient feels remarkably improved. She states that she is able to breathe better. Slept well last night. Tolerating all the medications.  Objective:  Vital Signs in the last 24 hours: Temp:  [97.7 F (36.5 C)-98.3 F (36.8 C)] 98.3 F (36.8 C) (07/03 0635) Pulse Rate:  [77-88] 86  (07/03 0635) Resp:  [18-20] 20  (07/03 0635) BP: (148-168)/(43-67) 161/54 mmHg (07/03 0635) SpO2:  [94 %-98 %] 94 % (07/03 0635) Weight:  [78.155 kg (172 lb 4.8 oz)] 78.155 kg (172 lb 4.8 oz) (07/03 1610)  Intake/Output from previous day: 07/02 0701 - 07/03 0700 In: 1369 [P.O.:1320; I.V.:9; IV Piggyback:40] Out: 2500 [Urine:2500]  Physical Exam: Moderately built and normal bodily habitus, in no acute distress. Appears appropriate for age. Alert Ox3 and in no acute distress.  HEENT: normal limits. PERRLA, There is no cyanosis.  JVD present about 6-8 cm above the clavicle.  Heart: PMI is shifted to the left anterior clavicular border. S1 normal, S2 muffled, No gallop present. III/IV early diastolic murmur in the right sternal border and apex.  CHEST EXAM: No tenderness of chest wall. LUNGS: Rales are present bilaterally at bases.  ABDOMEN: No hepatosplenomegaly. BS normal in all 4 quadrants. Abdomen is non-tender.  EXTREMITY: Warm, non tender Pitting edema bilateral above knee, ankle 2-3 plus. Edema also noted in the arms bilateral. Legs are very firm and skin is Peu-de-Orange appearance.    MUSCULOSKELETAL EXAM: Intact with full range of motion in all 4 extremities.  VASCULAR EXAM: No skin breakdown. Carotids normal. Extremities: Femoral pulse normal, no bruit. Popliteal pulse normal ; Pedal pulse 1-2 plus right and 2 plus left. No prominent pulse felt in the abdomen. No varicose veins.     Lab Results: No results found for this basename: WBC:2,HGB:2,PLT:2 in the last 72 hours  Basename 08/22/11 0635 08/21/11 0506  NA 145 144  K 3.7 4.3  CL 101 105  CO2 33* 29  GLUCOSE 101* 95  BUN 24*  23  CREATININE 0.87 0.95   No results found for this basename: TROPONINI:2,CK,MB:2 in the last 72 hours Hepatic Function Panel No results found for this basename: PROT,ALBUMIN,AST,ALT,ALKPHOS,BILITOT,BILIDIR,IBILI in the last 72 hours  Basename 08/22/11 0635  CHOL 141   No results found for this basename: PROTIME in the last 72 hours  Imaging: Dg Chest 1 View  08/22/2011  *RADIOLOGY REPORT*  Clinical Data: Status post left thoracentesis.  CHEST - 1 VIEW  Comparison: 08/22/2011  Findings: Cardiac silhouette is enlarged.  There are bilateral pleural effusions, left greater than right.  Left effusion has decreased since previous study.  No evidence for pneumothorax.  IMPRESSION:  1.  No evidence for pneumothorax following thoracentesis. 2.  Bilateral pleural effusions. 3.  Cardiomegaly.  Original Report Authenticated By: Patterson Hammersmith, M.D.   Dg Chest 2 View  08/22/2011  *RADIOLOGY REPORT*  Clinical Data: Follow-up pleural effusion  CHEST - 2 VIEW  Comparison: Chest radiograph 08/17/2011  Findings: Cardiopericardial silhouette is moderately enlarged. There is central pulmonary vascular congestion.  Small rounded densities over the chest bilaterally have appearances consistent with attachments for cardiac leads.  There is no pulmonary edema. There are moderate to large left and small right pleural effusions. The pleural effusions appear similar to versus slightly decreased compared to 08/17/2011.  A left basilar opacity is noted.  In the setting of an effusion, this is most likely atelectasis, however airspace disease cannot be excluded.  IMPRESSION:  1. Left greater than right pleural effusions appear  similar to slightly decreased compared to the recent study. A left basilar opacity persists.  Continued radiographic follow-up to resolution is suggested. 2.  Moderate enlargement of the cardiopericardial silhouette. A pericardial effusion cannot be excluded.  Original Report Authenticated By: Britta Mccreedy, M.D.    Cardiac Studies:  Assessment/Plan:  1. Generalized anasarca, etiology remains unsure. I also reviewed the chest x-ray findings and there is suggestion of pericardial effusion. Hence constrictive pericarditis is unlikely as it was in my differential diagnosis previously dictated by me. 2. Evaluation of pleural effusion reveals presence of transudate. 3. Moderate aortic regurgitation.  Recommendation: She is responded well to spironolactone and Lasix. Continue same I will see her back in the office in one week. I may consider left and right heart catheterization. Repeat chest x-ray in about a week to exclude any underlying mass needs to be performed. She also needs lower extremity support stockings knee-high before discharge. To keep the foot elevated while she is sitting or sleeping at night. Again to avoid salt and excessive fluids was discussed with the patient. I thank you for involving me in the care of the patient. She can be discharged home from cardiac today.   Pamella Pert, M.D. 08/23/2011, 8:30 AM

## 2011-09-11 NOTE — CV Procedure (Signed)
Procedures performed: Right and left heart catheterization ascending aortogram and calculation of cardiac output and cardiac index by Fick,  and thermodilution.    Right femoral arterial and venous access was utilized for performing the procedure.   Indication: Patient is a 71 year-old womale with hypertension,  Aortic regurgitation  Presenting with CHF. Outpatient stress testing had revealed moderate AI and evidence of right heart failure.  Hence is brought to the cardiac catheterization lab to evaluate  coronary anatomy. Right heart catheterization being performed for evaluation of dyspnea and pulmonary hypertension and to evaluate cardiac output and cardiac index.  A 5 French right femoral artery sheath introduced into artery and a 7 F right femoral vein access. A 7 French Swan-Ganz catheter was advanced with balloon inflated on the sheath under fluoroscopic guidance into first the right atrium followed by the right ventricle and into the pulmonary artery to pulmonary artery wedge position. Hemodynamics were obtained in a locations.  After hemodynamics were completed, samples were taken for SaO2% measurement to be used in [facility] /Index catheterization.  The catheter was then pulled back the balloon down and then completely out of the body.   Left Heart Catheterization   First a 5 [place] JL 4 catheter was advanced over standard J-wire into the ascending aorta and used to engage first the Left and a JR 4 catheter used to engage  Right Coronary Artery. Multiple cineangiographic views of the Left then Right Coronary Artery system(s) were performed.  A 5  French angled pigtail catheter which was used to cross the aortic valve for measurement Left Ventricular Hemodynamics. Left ventriculography was then performed in the RAO projection. Hemodynamics were then resampled and the catheter pulled back across the aortic valve for measurement of "pullback" gradient. The catheter was then removed the body over wire.  All exchanges were made over standard J wire.   Procedural data:  RA pressure 23/21  Mean 17 mm mercury. RA saturation 55%.  RV pressure 63/13 and Right ventricular EDP 23 mm Hg. PA pressure 64/30 with a mean of 43  mm mercury. PA saturation 48%.  Pulmonary capillary wedge 32/34 with a mean of 30 mm Hg. Aortic saturation 91%.  Cardiac output was 3.0 with cardiac index of 2.07  by Fick. And 2.8 and 1.4 by thermodilution  Angiographic data  Left ventricle:  performed.  Left systolic shows normal ejection fraction of 55-60%   Right coronary artery: Smooth, dominant and normal   Left main coronary artery: Normal. No stenosis.   LAD: Large, smooth and normal. Gives origin to a moderate sized diagonal 1 which is smooth and normal.   Circumflex coronary artery: Smooth normal. Non-dominant.  Ascending Aortogram: Severe aortic regurgitation noted.   Impression: Normal left heart catheterizaton with normal coronary arteries. Markedly elevated LV end diastolic pressure due to aortic regurgitation.  Severe aortic regurgitation.  Right Heart Cath:  Revealing severe pulmonary hypertension with preserved cardiac output and cardiac index.    Recommendation: I discussed the findings of the precatheterization with patient. Patient will be followed up in the office in 2 weeks for access site evaluation. There was no immediate complication.   I will refer her for aortic valve replacement. I suspect her acute decompensated congestive heart failure with evidence of pleural effusion and leg edema is probably due to severe aortic regurgitation.

## 2011-10-02 ENCOUNTER — Encounter: Payer: Self-pay | Admitting: *Deleted

## 2011-10-02 DIAGNOSIS — I351 Nonrheumatic aortic (valve) insufficiency: Secondary | ICD-10-CM

## 2011-10-03 ENCOUNTER — Encounter: Payer: Self-pay | Admitting: Surgery

## 2011-10-03 ENCOUNTER — Institutional Professional Consult (permissible substitution) (INDEPENDENT_AMBULATORY_CARE_PROVIDER_SITE_OTHER): Payer: Medicare Other | Admitting: Surgery

## 2011-10-03 ENCOUNTER — Encounter (HOSPITAL_COMMUNITY): Payer: Self-pay | Admitting: Pharmacy Technician

## 2011-10-03 VITALS — BP 121/57 | HR 52 | Resp 18 | Ht 64.0 in | Wt 162.0 lb

## 2011-10-03 DIAGNOSIS — I359 Nonrheumatic aortic valve disorder, unspecified: Secondary | ICD-10-CM

## 2011-10-03 NOTE — Progress Notes (Signed)
                  [address]            [place] [address]          [phone]     PCP is No primary provider on file. Referring Provider is Ganji, Jagadeesh R, MD    Chief Complaint   Patient presents with   .  Aortic Insuffiency       Referral from Dr Ganji for eval on severe aortic regurgitation, 2D Echo 08/07/11 and 09/04/11, Cardiac Cath on 09/11/11        HPI:   The patient is a 71-year-old African American female with a history of hypertension who said that she was very active and feeling fine until April 2013 when she began developing progressively worsening dyspnea and generalized anasarca especially in her legs. She initially thought that she had arthritis developing because of stiffness in her legs. She had an echocardiogram done on 08/17/2011 that showed moderate aortic insufficiency with right ventricular dysfunction. She was started on diuretics due to worsening symptoms and she was admitted to the hospital for acute on chronic diastolic heart failure. She was treated with intravenous diuretics with slow improvement. She was noted to have a moderate to large left pleural effusion and underwent an ultrasound-guided thoracentesis showing this to be a transudate. She also had ascites and underwent paracentesis. Cultures were negative. She was discharged on diuretics and said that she has continued to improve. She no longer has shortness of breath. She said her weight went from 192 pounds down to 145 pounds with diuresis. A 2-D echocardiogram on 09/04/2011 showed left ventricular cavity size be normal. There was moderate concentric left hypertrophy. There was abnormal septal wall motion due to right ventricular volume overload. Left ventricular ejection fraction was estimated at 40% with global hypokinesis. There was grade 2 diastolic dysfunction. The left and right atrial cavities were severely dilated. The right ventricular cavity was moderately enlarged with  severe global hypokinesis. There was moderate to severe aortic insufficiency but appeared worse than on the prior echocardiogram. There was mild to moderate mitral regurgitation with a structurally normal mitral valve. There was mild to moderate tricuspid regurgitation with a structurally normal tricuspid valve. There was evidence of mild pulmonary hypertension. There was a large left pleural effusion. She subsequently underwent a left and right heart catheterization on 09/11/2011 that showed normal coronary arteries. There is severe aortic insufficiency with a normal sized ascending aorta. Left ventricular ejection fraction is 55-60%. PA pressure was 64/30 with a mean of 43. Pulmonary artery saturation is 48%. The pulmonary capillary wedge pressure was 32/34 with a mean of 30. Aortic saturation is 91%. Right ventricular pressure was 63/13 with a right ventricular end-diastolic pressure of 23. Right atrial pressure was 23/21 with a mean of 17 and a right atrial saturation of 55%. Cardiac output was 3.0 with an index of 2.07 by Fick. Cardiac output was 2.8 with an index of 1.4 by thermaodilution.    Past Medical History   Diagnosis  Date   .  Hypertension     .  GERD (gastroesophageal reflux disease)          History reviewed. No pertinent past surgical history.    Family History   Problem  Relation  Age of Onset   .  Hypertension  Mother          Social History History   Substance   Use Topics   .  Smoking status:  Never Smoker    .  Smokeless tobacco:  Not on file   .  Alcohol Use:  No         Current Outpatient Prescriptions   Medication  Sig  Dispense  Refill   .  aspirin 81 MG tablet  Take 81 mg by mouth daily.         .  furosemide (LASIX) 40 MG tablet  Take 40 mg by mouth daily.          .  losartan (COZAAR) 50 MG tablet  Take 50 mg by mouth 2 (two) times daily.          .  metoprolol tartrate (LOPRESSOR) 25 MG tablet  Take 25 mg by mouth 2 (two) times daily. 12.5 mg po BID  (1/2 tab of 25 mg)         .  potassium chloride SA (K-DUR,KLOR-CON) 20 MEQ tablet  Take 20 mEq by mouth daily.          .  spironolactone (ALDACTONE) 25 MG tablet  Take 25 mg by mouth daily.              No Known Allergies   Review of Systems  Constitutional: Positive for activity change, fatigue and unexpected weight change. Negative for fever, chills, diaphoresis and appetite change.        40 lb wt gain over 1 month prior to presentation. Has subsequently lost 52 lbs with diuresis.  HENT: Negative.   Eyes: Negative.   Respiratory: Positive for shortness of breath.   Cardiovascular: Positive for leg swelling.  Gastrointestinal: Positive for abdominal pain and abdominal distention. Negative for nausea, vomiting, diarrhea, constipation and blood in stool.  Genitourinary: Negative.   Musculoskeletal: Negative.   Skin: Negative.   Neurological: Negative.   Hematological: Negative.   Psychiatric/Behavioral: Negative.       BP 121/57  Pulse 52  Resp 18  Ht 5' 4" (1.626 m)  Wt 162 lb (73.483 kg)  BMI 27.81 kg/m2  SpO2 98% Physical Exam  Constitutional: She is oriented to person, place, and time. She appears well-developed and well-nourished. No distress.  HENT:   Head: Normocephalic and atraumatic.   Mouth/Throat: Oropharynx is clear and moist. No oropharyngeal exudate.  Eyes: Conjunctivae and EOM are normal. Pupils are equal, round, and reactive to light.  Neck: Normal range of motion. Neck supple. No JVD present. No tracheal deviation present. No thyromegaly present.  Cardiovascular: Normal rate and regular rhythm.    Murmur heard.      III/IV diastolic AI murmur heard throughout precordium.  Pulmonary/Chest: Effort normal. No respiratory distress. She has no rales.       Diminished breath sounds at left base.  Abdominal: Soft. Bowel sounds are normal. She exhibits no distension and no mass. There is no tenderness.  Musculoskeletal: She exhibits edema.       Marked  brawny edema to knees bilat.  Lymphadenopathy:    She has no cervical adenopathy.  Neurological: She is alert and oriented to person, place, and time. She has normal strength. No cranial nerve deficit or sensory deficit.  Skin: Skin is warm and dry.       Diffuse pigmented skin lesions throughout trunk  Psychiatric: She has a normal mood and affect.        Diagnostic Tests:    Left Heart Catheterization    First a 5 French JL 4 catheter was advanced   over standard J-wire into the ascending aorta and used to engage first the Left and a JR 4 catheter used to engage  Right Coronary Artery. Multiple cineangiographic views of the Left then Right Coronary Artery system(s) were performed.   A 5  French angled pigtail catheter which was used to cross the aortic valve for measurement Left Ventricular Hemodynamics. Left ventriculography was then performed in the RAO projection. Hemodynamics were then resampled and the catheter pulled back across the aortic valve for measurement of "pullback" gradient. The catheter was then removed the body over wire. All exchanges were made over standard J wire.    Procedural data:   RA pressure 23/21  Mean 17 mm mercury. RA saturation 55%.   RV pressure 63/13 and Right ventricular EDP 23 mm Hg. PA pressure 64/30 with a mean of 43  mm mercury. PA saturation 48%.   Pulmonary capillary wedge 32/34 with a mean of 30 mm Hg. Aortic saturation 91%.   Cardiac output was 3.0 with cardiac index of 2.07  by Fick. And 2.8 and 1.4 by thermodilution   Angiographic data   Left ventricle:  performed.  Left systolic shows normal ejection fraction of 55-60%    Right coronary artery: Smooth, dominant and normal    Left main coronary artery: Normal. No stenosis.    LAD: Large, smooth and normal. Gives origin to a moderate sized diagonal 1 which is smooth and normal.    Circumflex coronary artery: Smooth normal. Non-dominant.   Ascending Aortogram: Severe aortic  regurgitation noted.    Impression: Normal left heart catheterizaton with normal coronary arteries. Markedly elevated LV end diastolic pressure due to aortic regurgitation.   Severe aortic regurgitation.   Right Heart Cath:  Revealing severe pulmonary hypertension with preserved cardiac output and cardiac index.      Recommendation: I discussed the findings of the precatheterization with patient. Patient will be followed up in the office in 2 weeks for access site evaluation. There was no immediate complication.    I will refer her for aortic valve replacement. I suspect her acute decompensated congestive heart failure with evidence of pleural effusion and leg edema is probably due to severe aortic regurgitation.              Impression/Plan:   She has severe aortic insufficiency with biventricular dysfunction and predominant right heart failure. She has mild to moderate mitral and tricuspid regurgitation with structurally normal valves by echocardiogram with severe pulmonary hypertension. She has improved dramatically with diuresis and has preserved cardiac index so I think aortic valve replacement is probably the best treatment for her. I would plan to use a tissue valve given her age. She will require intraoperative evaluation of the mitral and tricuspid valves with TEE to be sure that no intervention is needed for those. I discussed the operative procedure with the patient including alternatives, benefits and risks; including but not limited to bleeding, blood transfusion, infection, stroke, myocardial infarction, graft failure, heart block requiring a permanent pacemaker, organ dysfunction, and death.  Leshawn R Tolbert understands and agrees to proceed.  We will schedule surgery for Thursday 10/12/11.             

## 2011-10-04 ENCOUNTER — Other Ambulatory Visit: Payer: Self-pay

## 2011-10-04 ENCOUNTER — Other Ambulatory Visit: Payer: Self-pay | Admitting: Surgery

## 2011-10-04 DIAGNOSIS — I359 Nonrheumatic aortic valve disorder, unspecified: Secondary | ICD-10-CM

## 2011-10-04 DIAGNOSIS — I2510 Atherosclerotic heart disease of native coronary artery without angina pectoris: Secondary | ICD-10-CM

## 2011-10-10 ENCOUNTER — Encounter (HOSPITAL_COMMUNITY)
Admission: RE | Admit: 2011-10-10 | Discharge: 2011-10-10 | Disposition: A | Discharge status: Home / Self Care | Payer: Medicare Other | Source: Ambulatory Visit | Attending: Cardiothoracic Surgery | Admitting: Cardiothoracic Surgery

## 2011-10-10 ENCOUNTER — Inpatient Hospital Stay (HOSPITAL_COMMUNITY)
Admission: RE | Admit: 2011-10-10 | Discharge: 2011-10-10 | Disposition: A | Discharge status: Home / Self Care | Payer: Medicare Other | Source: Ambulatory Visit | Attending: Surgery | Admitting: Surgery

## 2011-10-10 ENCOUNTER — Encounter (HOSPITAL_COMMUNITY): Payer: Self-pay

## 2011-10-10 ENCOUNTER — Ambulatory Visit (HOSPITAL_COMMUNITY)
Admission: RE | Admit: 2011-10-10 | Discharge: 2011-10-10 | Disposition: A | Discharge status: Home / Self Care | Payer: Medicare Other | Source: Ambulatory Visit | Attending: Surgery | Admitting: Surgery

## 2011-10-10 ENCOUNTER — Other Ambulatory Visit (HOSPITAL_COMMUNITY): Payer: Self-pay | Admitting: Radiology

## 2011-10-10 ENCOUNTER — Encounter (HOSPITAL_COMMUNITY)
Admission: RE | Admit: 2011-10-10 | Discharge: 2011-10-10 | Disposition: A | Discharge status: Home / Self Care | Payer: Medicare Other | Source: Ambulatory Visit | Attending: Surgery | Admitting: Surgery

## 2011-10-10 VITALS — BP 172/63 | HR 67 | Temp 97.0°F | Resp 20 | Ht 64.0 in | Wt 156.1 lb

## 2011-10-10 DIAGNOSIS — I359 Nonrheumatic aortic valve disorder, unspecified: Secondary | ICD-10-CM | POA: Insufficient documentation

## 2011-10-10 DIAGNOSIS — Z01818 Encounter for other preprocedural examination: Secondary | ICD-10-CM | POA: Insufficient documentation

## 2011-10-10 DIAGNOSIS — I351 Nonrheumatic aortic (valve) insufficiency: Secondary | ICD-10-CM

## 2011-10-10 DIAGNOSIS — I2510 Atherosclerotic heart disease of native coronary artery without angina pectoris: Secondary | ICD-10-CM

## 2011-10-10 DIAGNOSIS — I517 Cardiomegaly: Secondary | ICD-10-CM | POA: Insufficient documentation

## 2011-10-10 DIAGNOSIS — Z01812 Encounter for preprocedural laboratory examination: Secondary | ICD-10-CM | POA: Insufficient documentation

## 2011-10-10 DIAGNOSIS — Z01810 Encounter for preprocedural cardiovascular examination: Secondary | ICD-10-CM

## 2011-10-10 DIAGNOSIS — J90 Pleural effusion, not elsewhere classified: Secondary | ICD-10-CM | POA: Insufficient documentation

## 2011-10-10 HISTORY — DX: Nonrheumatic aortic (valve) stenosis: I35.0

## 2011-10-10 LAB — BLOOD GAS, ARTERIAL
Acid-base deficit: 0 mmol/L (ref 0.0–2.0)
Bicarbonate: 23.6 mEq/L (ref 20.0–24.0)
Drawn by: 344381
O2 Saturation: 98 %
Patient temperature: 98.6
TCO2: 24.7 mmol/L (ref 0–100)
pCO2 arterial: 35.3 mmHg (ref 35.0–45.0)
pH, Arterial: 7.44 (ref 7.350–7.450)
pO2, Arterial: 92.6 mmHg (ref 80.0–100.0)

## 2011-10-10 LAB — URINE MICROSCOPIC-ADD ON

## 2011-10-10 LAB — COMPREHENSIVE METABOLIC PANEL
ALT: 24 U/L (ref 0–35)
AST: 24 U/L (ref 0–37)
Albumin: 3.8 g/dL (ref 3.5–5.2)
Alkaline Phosphatase: 70 U/L (ref 39–117)
BUN: 37 mg/dL — ABNORMAL HIGH (ref 6–23)
CO2: 23 mEq/L (ref 19–32)
Calcium: 9.8 mg/dL (ref 8.4–10.5)
Chloride: 106 mEq/L (ref 96–112)
Creatinine, Ser: 1.11 mg/dL — ABNORMAL HIGH (ref 0.50–1.10)
GFR calc Af Amer: 57 mL/min — ABNORMAL LOW (ref >90)
GFR calc non Af Amer: 49 mL/min — ABNORMAL LOW (ref >90)
Glucose, Bld: 122 mg/dL — ABNORMAL HIGH (ref 70–99)
Potassium: 4.3 mEq/L (ref 3.5–5.1)
Sodium: 140 mEq/L (ref 135–145)
Total Bilirubin: 0.7 mg/dL (ref 0.3–1.2)
Total Protein: 7 g/dL (ref 6.0–8.3)

## 2011-10-10 LAB — URINALYSIS, ROUTINE W REFLEX MICROSCOPIC
Bilirubin Urine: NEGATIVE
Glucose, UA: NEGATIVE mg/dL
Ketones, ur: NEGATIVE mg/dL
Nitrite: NEGATIVE
Protein, ur: >300 mg/dL — AB
Specific Gravity, Urine: 1.026 (ref 1.005–1.030)
Urobilinogen, UA: 1 mg/dL (ref 0.0–1.0)
pH: 5 (ref 5.0–8.0)

## 2011-10-10 LAB — CBC
HCT: 38.8 % (ref 36.0–46.0)
Hemoglobin: 12.5 g/dL (ref 12.0–15.0)
MCH: 30.6 pg (ref 26.0–34.0)
MCHC: 32.2 g/dL (ref 30.0–36.0)
MCV: 95.1 fL (ref 78.0–100.0)
Platelets: 142 10*3/uL — ABNORMAL LOW (ref 150–400)
RBC: 4.08 MIL/uL (ref 3.87–5.11)
RDW: 17.2 % — ABNORMAL HIGH (ref 11.5–15.5)
WBC: 5.4 10*3/uL (ref 4.0–10.5)

## 2011-10-10 LAB — PROTIME-INR
INR: 1.18 (ref 0.00–1.49)
Prothrombin Time: 15.2 seconds (ref 11.6–15.2)

## 2011-10-10 LAB — PULMONARY FUNCTION TEST

## 2011-10-10 LAB — ABO/RH: ABO/RH(D): A POS

## 2011-10-10 LAB — SURGICAL PCR SCREEN
MRSA, PCR: NEGATIVE
Staphylococcus aureus: NEGATIVE

## 2011-10-10 LAB — APTT: aPTT: 27 seconds (ref 24–37)

## 2011-10-10 MED ORDER — ALBUTEROL SULFATE (5 MG/ML) 0.5% IN NEBU
2.5000 mg | INHALATION_SOLUTION | Freq: Once | RESPIRATORY_TRACT | Status: AC
  Administered 2011-10-10: 2.5 mg via RESPIRATORY_TRACT

## 2011-10-10 NOTE — Progress Notes (Signed)
VASCULAR LAB PRELIMINARY  PRELIMINARY  PRELIMINARY  PRELIMINARY  Pre-op Cardiac Surgery  Carotid Findings:  Bilateral:  No evidence of hemodynamically significant internal carotid artery stenosis.   Vertebral artery flow is antegrade.     Upper Extremity Right Left  Brachial Pressures 152 Triphasic 159 Triphasic  Radial Waveforms Triphasic Triphasic  Ulnar Waveforms Triphasic Triphasic  Palmar Arch ([name]'s Test) Abnormal Normal   Findings:  Doppler waveforms remained normal on the right with radial compression and obliterated with ulnar compression. Left Doppler waveforms remained normal with both radial and ulnar compressions.                             Ivis Nicolson, RVS 10/10/2011, 3:14 PM

## 2011-10-10 NOTE — Pre-Procedure Instructions (Signed)
19 [name]  10/10/2011   Your procedure is scheduled on:  10-12-2011  Report to [facility] at 5:30  AM.  Call this number if you have problems the morning of surgery: [phone]   Remember:   Do not eat food or drink:After Midnight.     Take these medicines the morning of surgery with A SIP OF WATER: Metoprolol(Lopressor) Spironolactone(Aldactone)   Do not wear jewelry, make-up or nail polish.  Do not wear lotions, powders, or perfumes. You may wear deodorant.  Do not shave 48 hours prior to surgery. Men may shave face and neck.  Do not bring valuables to the hospital.  Contacts, dentures or bridgework may not be worn into surgery.  Leave suitcase in the car. After surgery it may be brought to your room.  For patients admitted to the hospital, checkout time is 11:00 AM the day of discharge.  Marland Kitchen   Special Instructions: Incentive Spirometry - Practice and bring it with you on the day of surgery. and CHG Shower Use Special Wash: 1/2 bottle night before surgery and 1/2 bottle morning of surgery.      Please read over the following fact sheets that you were given: Pain Booklet, Blood Transfusion Information, Open Heart Packet, MRSA Information and Surgical Site Infection Prevention

## 2011-10-11 ENCOUNTER — Encounter (HOSPITAL_COMMUNITY): Payer: Self-pay | Admitting: Certified Registered"

## 2011-10-11 LAB — HEMOGLOBIN A1C
Hgb A1c MFr Bld: 6.3 % — ABNORMAL HIGH (ref <5.7)
Mean Plasma Glucose: 134 mg/dL — ABNORMAL HIGH (ref <117)

## 2011-10-11 MED ORDER — EPINEPHRINE HCL 1 MG/ML IJ SOLN
0.5000 ug/min | INTRAVENOUS | Status: DC
  Filled 2011-10-11: qty 4

## 2011-10-11 MED ORDER — SODIUM CHLORIDE 0.9 % IV SOLN
INTRAVENOUS | Status: DC
  Filled 2011-10-11: qty 1

## 2011-10-11 MED ORDER — TRANEXAMIC ACID (OHS) PUMP PRIME SOLUTION
2.0000 mg/kg | INTRAVENOUS | Status: DC
  Filled 2011-10-11: qty 1.42

## 2011-10-11 MED ORDER — DEXTROSE 5 % IV SOLN
750.0000 mg | INTRAVENOUS | Status: DC
  Filled 2011-10-11: qty 750

## 2011-10-11 MED ORDER — DOPAMINE-DEXTROSE 3.2-5 MG/ML-% IV SOLN
2.0000 ug/kg/min | INTRAVENOUS | Status: DC
  Filled 2011-10-11: qty 250

## 2011-10-11 MED ORDER — VANCOMYCIN HCL 1000 MG IV SOLR
1250.0000 mg | INTRAVENOUS | Status: AC
  Administered 2011-10-12: 1250 mg via INTRAVENOUS
  Filled 2011-10-11: qty 1250

## 2011-10-11 MED ORDER — PHENYLEPHRINE HCL 10 MG/ML IJ SOLN
30.0000 ug/min | INTRAVENOUS | Status: DC
  Filled 2011-10-11: qty 2

## 2011-10-11 MED ORDER — NITROGLYCERIN IN D5W 200-5 MCG/ML-% IV SOLN
2.0000 ug/min | INTRAVENOUS | Status: DC
  Filled 2011-10-11: qty 250

## 2011-10-11 MED ORDER — SODIUM BICARBONATE 8.4 % IV SOLN
INTRAVENOUS | Status: DC
  Filled 2011-10-11: qty 2.5

## 2011-10-11 MED ORDER — METOPROLOL TARTRATE 12.5 MG HALF TABLET: 12.5000 mg | ORAL_TABLET | Freq: Once | ORAL | Status: DC

## 2011-10-11 MED ORDER — DEXMEDETOMIDINE HCL IN NACL 400 MCG/100ML IV SOLN
0.1000 ug/kg/h | INTRAVENOUS | Status: DC
  Filled 2011-10-11: qty 100

## 2011-10-11 MED ORDER — TRANEXAMIC ACID (OHS) BOLUS VIA INFUSION
15.0000 mg/kg | INTRAVENOUS | Status: AC
  Administered 2011-10-12: 1062 mg via INTRAVENOUS
  Filled 2011-10-11: qty 1062

## 2011-10-11 MED ORDER — DEXTROSE 5 % IV SOLN
1.5000 g | INTRAVENOUS | Status: AC
  Administered 2011-10-12: .75 g via INTRAVENOUS
  Administered 2011-10-12: 1.5 g via INTRAVENOUS
  Filled 2011-10-11: qty 1.5

## 2011-10-11 MED ORDER — MAGNESIUM SULFATE 50 % IJ SOLN
40.0000 meq | INTRAMUSCULAR | Status: DC
  Filled 2011-10-11: qty 10

## 2011-10-11 MED ORDER — TRANEXAMIC ACID 100 MG/ML IV SOLN
1.5000 mg/kg/h | INTRAVENOUS | Status: DC
  Filled 2011-10-11: qty 25

## 2011-10-11 MED ORDER — POTASSIUM CHLORIDE 2 MEQ/ML IV SOLN
80.0000 meq | INTRAVENOUS | Status: DC
  Filled 2011-10-11: qty 40

## 2011-10-12 ENCOUNTER — Encounter (HOSPITAL_COMMUNITY): Payer: Self-pay | Admitting: Certified Registered"

## 2011-10-12 ENCOUNTER — Inpatient Hospital Stay (HOSPITAL_COMMUNITY): Payer: Medicare Other

## 2011-10-12 ENCOUNTER — Encounter (HOSPITAL_COMMUNITY): Payer: Self-pay | Admitting: Anesthesiology

## 2011-10-12 ENCOUNTER — Encounter (HOSPITAL_COMMUNITY): Payer: Self-pay | Admitting: *Deleted

## 2011-10-12 ENCOUNTER — Inpatient Hospital Stay (HOSPITAL_COMMUNITY): Payer: Medicare Other | Admitting: Anesthesiology

## 2011-10-12 ENCOUNTER — Inpatient Hospital Stay: Admit: 2011-10-12 | Payer: Self-pay | Admitting: Surgery

## 2011-10-12 ENCOUNTER — Encounter (HOSPITAL_COMMUNITY)
Admission: RE | Disposition: A | Discharge status: Skilled Nursing Facility | Payer: Self-pay | Source: Ambulatory Visit | Attending: Surgery

## 2011-10-12 ENCOUNTER — Inpatient Hospital Stay (HOSPITAL_COMMUNITY)
Admission: RE | Admit: 2011-10-12 | Discharge: 2011-10-20 | DRG: 220 | Disposition: A | Discharge status: Skilled Nursing Facility | Payer: Medicare Other | Source: Ambulatory Visit | Attending: Surgery | Admitting: Surgery

## 2011-10-12 ENCOUNTER — Inpatient Hospital Stay (HOSPITAL_COMMUNITY): Payer: Medicare Other | Admitting: Certified Registered"

## 2011-10-12 DIAGNOSIS — Z01812 Encounter for preprocedural laboratory examination: Secondary | ICD-10-CM

## 2011-10-12 DIAGNOSIS — I080 Rheumatic disorders of both mitral and aortic valves: Principal | ICD-10-CM | POA: Diagnosis present

## 2011-10-12 DIAGNOSIS — Z952 Presence of prosthetic heart valve: Secondary | ICD-10-CM

## 2011-10-12 DIAGNOSIS — J9819 Other pulmonary collapse: Secondary | ICD-10-CM | POA: Diagnosis not present

## 2011-10-12 DIAGNOSIS — D62 Acute posthemorrhagic anemia: Secondary | ICD-10-CM | POA: Diagnosis not present

## 2011-10-12 DIAGNOSIS — D684 Acquired coagulation factor deficiency: Secondary | ICD-10-CM | POA: Diagnosis not present

## 2011-10-12 DIAGNOSIS — I4892 Unspecified atrial flutter: Secondary | ICD-10-CM | POA: Diagnosis not present

## 2011-10-12 DIAGNOSIS — R5381 Other malaise: Secondary | ICD-10-CM | POA: Diagnosis not present

## 2011-10-12 DIAGNOSIS — I359 Nonrheumatic aortic valve disorder, unspecified: Secondary | ICD-10-CM

## 2011-10-12 DIAGNOSIS — Z7982 Long term (current) use of aspirin: Secondary | ICD-10-CM

## 2011-10-12 DIAGNOSIS — E876 Hypokalemia: Secondary | ICD-10-CM | POA: Diagnosis not present

## 2011-10-12 DIAGNOSIS — D6959 Other secondary thrombocytopenia: Secondary | ICD-10-CM | POA: Diagnosis not present

## 2011-10-12 DIAGNOSIS — Y9210 Unspecified residential institution as the place of occurrence of the external cause: Secondary | ICD-10-CM | POA: Diagnosis not present

## 2011-10-12 DIAGNOSIS — I5032 Chronic diastolic (congestive) heart failure: Secondary | ICD-10-CM | POA: Diagnosis present

## 2011-10-12 DIAGNOSIS — K219 Gastro-esophageal reflux disease without esophagitis: Secondary | ICD-10-CM | POA: Diagnosis present

## 2011-10-12 DIAGNOSIS — I059 Rheumatic mitral valve disease, unspecified: Secondary | ICD-10-CM

## 2011-10-12 DIAGNOSIS — I10 Essential (primary) hypertension: Secondary | ICD-10-CM | POA: Diagnosis present

## 2011-10-12 DIAGNOSIS — IMO0002 Reserved for concepts with insufficient information to code with codable children: Secondary | ICD-10-CM | POA: Diagnosis not present

## 2011-10-12 DIAGNOSIS — I351 Nonrheumatic aortic (valve) insufficiency: Secondary | ICD-10-CM

## 2011-10-12 DIAGNOSIS — Z9889 Other specified postprocedural states: Secondary | ICD-10-CM

## 2011-10-12 DIAGNOSIS — Z79899 Other long term (current) drug therapy: Secondary | ICD-10-CM

## 2011-10-12 DIAGNOSIS — Y831 Surgical operation with implant of artificial internal device as the cause of abnormal reaction of the patient, or of later complication, without mention of misadventure at the time of the procedure: Secondary | ICD-10-CM | POA: Diagnosis not present

## 2011-10-12 DIAGNOSIS — I2789 Other specified pulmonary heart diseases: Secondary | ICD-10-CM | POA: Diagnosis present

## 2011-10-12 DIAGNOSIS — I4891 Unspecified atrial fibrillation: Secondary | ICD-10-CM | POA: Diagnosis not present

## 2011-10-12 DIAGNOSIS — I509 Heart failure, unspecified: Secondary | ICD-10-CM | POA: Diagnosis present

## 2011-10-12 DIAGNOSIS — I9589 Other hypotension: Secondary | ICD-10-CM | POA: Diagnosis not present

## 2011-10-12 HISTORY — PX: MITRAL VALVE REPAIR: SHX2039

## 2011-10-12 HISTORY — PX: AORTIC VALVE REPLACEMENT: SHX41

## 2011-10-12 HISTORY — PX: EXPLORATION POST OPERATIVE OPEN HEART: SHX5061

## 2011-10-12 LAB — POCT I-STAT 3, ART BLOOD GAS (G3+)
Acid-Base Excess: 5 mmol/L — ABNORMAL HIGH (ref 0.0–2.0)
Acid-Base Excess: 7 mmol/L — ABNORMAL HIGH (ref 0.0–2.0)
Acid-Base Excess: 3 mmol/L — ABNORMAL HIGH (ref 0.0–2.0)
Acid-Base Excess: 3 mmol/L — ABNORMAL HIGH (ref 0.0–2.0)
Bicarbonate: 29.4 mEq/L — ABNORMAL HIGH (ref 20.0–24.0)
Bicarbonate: 30 mEq/L — ABNORMAL HIGH (ref 20.0–24.0)
O2 Saturation: 100 %
O2 Saturation: 100 %
O2 Saturation: 100 %
Patient temperature: 35
TCO2: 31 mmol/L (ref 0–100)
TCO2: 31 mmol/L (ref 0–100)
TCO2: 27 mmol/L (ref 0–100)
pCO2 arterial: 41.3 mmHg (ref 35.0–45.0)
pCO2 arterial: 29.8 mmHg — ABNORMAL LOW (ref 35.0–45.0)
pCO2 arterial: 34.5 mmHg — ABNORMAL LOW (ref 35.0–45.0)
pCO2 arterial: 29.6 mmHg — ABNORMAL LOW (ref 35.0–45.0)
pH, Arterial: 7.611 (ref 7.350–7.450)
pO2, Arterial: 392 mmHg — ABNORMAL HIGH (ref 80.0–100.0)
pO2, Arterial: 314 mmHg — ABNORMAL HIGH (ref 80.0–100.0)
pO2, Arterial: 478 mmHg — ABNORMAL HIGH (ref 80.0–100.0)
pO2, Arterial: 174 mmHg — ABNORMAL HIGH (ref 80.0–100.0)

## 2011-10-12 LAB — POCT I-STAT 7, (LYTES, BLD GAS, ICA,H+H)
Acid-Base Excess: 6 mmol/L — ABNORMAL HIGH (ref 0.0–2.0)
Calcium, Ion: 1.14 mmol/L (ref 1.13–1.30)
Calcium, Ion: 1.08 mmol/L — ABNORMAL LOW (ref 1.13–1.30)
Hemoglobin: 8.2 g/dL — ABNORMAL LOW (ref 12.0–15.0)
O2 Saturation: 100 %
O2 Saturation: 100 %
Patient temperature: 35.4
Potassium: 3.9 mEq/L (ref 3.5–5.1)
Potassium: 3.9 mEq/L (ref 3.5–5.1)
TCO2: 29 mmol/L (ref 0–100)
TCO2: 27 mmol/L (ref 0–100)
pCO2 arterial: 33.2 mmHg — ABNORMAL LOW (ref 35.0–45.0)
pH, Arterial: 7.489 — ABNORMAL HIGH (ref 7.350–7.450)
pO2, Arterial: 471 mmHg — ABNORMAL HIGH (ref 80.0–100.0)

## 2011-10-12 LAB — CBC
HCT: 31 % — ABNORMAL LOW (ref 36.0–46.0)
Hemoglobin: 9.9 g/dL — ABNORMAL LOW (ref 12.0–15.0)
MCH: 32.6 pg (ref 26.0–34.0)
MCH: 30 pg (ref 26.0–34.0)
MCHC: 31.9 g/dL (ref 30.0–36.0)
MCHC: 34.7 g/dL (ref 30.0–36.0)
Platelets: 97 10*3/uL — ABNORMAL LOW (ref 150–400)
Platelets: 67 10*3/uL — ABNORMAL LOW (ref 150–400)
RBC: 1.75 MIL/uL — ABNORMAL LOW (ref 3.87–5.11)
RBC: 3.13 MIL/uL — ABNORMAL LOW (ref 3.87–5.11)
RDW: 17.2 % — ABNORMAL HIGH (ref 11.5–15.5)
RDW: 15.3 % (ref 11.5–15.5)
WBC: 8.7 10*3/uL (ref 4.0–10.5)

## 2011-10-12 LAB — POCT I-STAT 4, (NA,K, GLUC, HGB,HCT)
Glucose, Bld: 124 mg/dL — ABNORMAL HIGH (ref 70–99)
Glucose, Bld: 116 mg/dL — ABNORMAL HIGH (ref 70–99)
Glucose, Bld: 148 mg/dL — ABNORMAL HIGH (ref 70–99)
HCT: 30 % — ABNORMAL LOW (ref 36.0–46.0)
HCT: 24 % — ABNORMAL LOW (ref 36.0–46.0)
HCT: 25 % — ABNORMAL LOW (ref 36.0–46.0)
Hemoglobin: 10.2 g/dL — ABNORMAL LOW (ref 12.0–15.0)
Hemoglobin: 8.8 g/dL — ABNORMAL LOW (ref 12.0–15.0)
Hemoglobin: 8.2 g/dL — ABNORMAL LOW (ref 12.0–15.0)
Hemoglobin: 8.5 g/dL — ABNORMAL LOW (ref 12.0–15.0)
Potassium: 3.2 mEq/L — ABNORMAL LOW (ref 3.5–5.1)
Potassium: 3.4 mEq/L — ABNORMAL LOW (ref 3.5–5.1)
Sodium: 143 mEq/L (ref 135–145)
Sodium: 143 mEq/L (ref 135–145)

## 2011-10-12 LAB — POCT I-STAT, CHEM 8
BUN: 22 mg/dL (ref 6–23)
Creatinine, Ser: 0.8 mg/dL (ref 0.50–1.10)
Glucose, Bld: 150 mg/dL — ABNORMAL HIGH (ref 70–99)
Hemoglobin: 8.8 g/dL — ABNORMAL LOW (ref 12.0–15.0)
TCO2: 26 mmol/L (ref 0–100)

## 2011-10-12 LAB — PROTIME-INR
INR: 1.91 — ABNORMAL HIGH (ref 0.00–1.49)
Prothrombin Time: 22.2 seconds — ABNORMAL HIGH (ref 11.6–15.2)
Prothrombin Time: 18.8 seconds — ABNORMAL HIGH (ref 11.6–15.2)
Prothrombin Time: 19.9 seconds — ABNORMAL HIGH (ref 11.6–15.2)

## 2011-10-12 LAB — APTT: aPTT: 42 seconds — ABNORMAL HIGH (ref 24–37)

## 2011-10-12 LAB — HEMOGLOBIN AND HEMATOCRIT, BLOOD: HCT: 25.6 % — ABNORMAL LOW (ref 36.0–46.0)

## 2011-10-12 SURGERY — EXPLORATION POST OPERATIVE OPEN HEART
Anesthesia: General | Site: Chest | Wound class: Clean

## 2011-10-12 SURGERY — REPLACEMENT, AORTIC VALVE, OPEN
Anesthesia: General | Site: Chest | Wound class: Clean

## 2011-10-12 MED ORDER — SODIUM CHLORIDE 0.9 % IV SOLN
INTRAVENOUS | Status: DC
  Administered 2011-10-12: 20 mL via INTRAVENOUS

## 2011-10-12 MED ORDER — MAGNESIUM SULFATE 50 % IJ SOLN
40.0000 meq | INTRAMUSCULAR | Status: DC
  Filled 2011-10-12: qty 10

## 2011-10-12 MED ORDER — MILRINONE IN DEXTROSE 200-5 MCG/ML-% IV SOLN
INTRAVENOUS | Status: DC | PRN
  Administered 2011-10-12: .3 ug/kg/min via INTRAVENOUS

## 2011-10-12 MED ORDER — CHLORHEXIDINE GLUCONATE 4 % EX LIQD: 30.0000 mL | CUTANEOUS | Status: DC

## 2011-10-12 MED ORDER — ASPIRIN EC 325 MG PO TBEC
325.0000 mg | DELAYED_RELEASE_TABLET | Freq: Every day | ORAL | Status: DC
  Administered 2011-10-13 – 2011-10-16 (×4): 325 mg via ORAL
  Filled 2011-10-12 (×4): qty 1

## 2011-10-12 MED ORDER — PROTAMINE SULFATE 10 MG/ML IV SOLN
INTRAVENOUS | Status: DC | PRN
  Administered 2011-10-12: 30 mg via INTRAVENOUS
  Administered 2011-10-12: 10 mg via INTRAVENOUS
  Administered 2011-10-12 (×7): 30 mg via INTRAVENOUS

## 2011-10-12 MED ORDER — MORPHINE SULFATE 2 MG/ML IJ SOLN
2.0000 mg | INTRAMUSCULAR | Status: DC | PRN
  Administered 2011-10-12: 4 mg via INTRAVENOUS
  Administered 2011-10-13: 1 mg via INTRAVENOUS
  Administered 2011-10-13 – 2011-10-14 (×3): 2 mg via INTRAVENOUS
  Filled 2011-10-12 (×2): qty 1
  Filled 2011-10-12: qty 2
  Filled 2011-10-12 (×2): qty 1

## 2011-10-12 MED ORDER — EPINEPHRINE HCL 1 MG/ML IJ SOLN
0.5000 ug/min | INTRAVENOUS | Status: DC
  Filled 2011-10-12: qty 4

## 2011-10-12 MED ORDER — LIDOCAINE HCL (CARDIAC) 20 MG/ML IV SOLN
INTRAVENOUS | Status: AC
  Filled 2011-10-12: qty 5

## 2011-10-12 MED ORDER — ROCURONIUM BROMIDE 100 MG/10ML IV SOLN
INTRAVENOUS | Status: DC | PRN
  Administered 2011-10-12 (×2): 50 mg via INTRAVENOUS

## 2011-10-12 MED ORDER — PLASMA-LYTE 148 IV SOLN
INTRAVENOUS | Status: DC
  Filled 2011-10-12: qty 2.5

## 2011-10-12 MED ORDER — LACTATED RINGERS IV SOLN: 500.0000 mL | Freq: Once | INTRAVENOUS | Status: AC | PRN

## 2011-10-12 MED ORDER — VANCOMYCIN HCL IN DEXTROSE 1-5 GM/200ML-% IV SOLN
1000.0000 mg | Freq: Once | INTRAVENOUS | Status: AC
  Administered 2011-10-12: 1000 mg
  Filled 2011-10-12 (×2): qty 200

## 2011-10-12 MED ORDER — NITROGLYCERIN IN D5W 200-5 MCG/ML-% IV SOLN
INTRAVENOUS | Status: DC | PRN
  Administered 2011-10-12: 16.6 ug/min via INTRAVENOUS

## 2011-10-12 MED ORDER — NITROGLYCERIN IN D5W 200-5 MCG/ML-% IV SOLN
0.0000 ug/min | INTRAVENOUS | Status: DC
Start: 2011-10-12 — End: 2011-10-15
  Administered 2011-10-13: 75 ug/min via INTRAVENOUS
  Filled 2011-10-12: qty 250

## 2011-10-12 MED ORDER — THROMBIN 20000 UNITS EX SOLR
CUTANEOUS | Status: AC
  Filled 2011-10-12: qty 20000

## 2011-10-12 MED ORDER — INSULIN ASPART 100 UNIT/ML ~~LOC~~ SOLN: 0.0000 [IU] | SUBCUTANEOUS | Status: DC

## 2011-10-12 MED ORDER — ACETAMINOPHEN 500 MG PO TABS
1000.0000 mg | ORAL_TABLET | Freq: Four times a day (QID) | ORAL | Status: DC
  Administered 2011-10-13 – 2011-10-15 (×5): 1000 mg via ORAL
  Filled 2011-10-12 (×14): qty 2

## 2011-10-12 MED ORDER — SODIUM CHLORIDE 0.9 % IR SOLN
Status: DC | PRN
  Administered 2011-10-12: 10:00:00

## 2011-10-12 MED ORDER — TRANEXAMIC ACID 100 MG/ML IV SOLN
1.5000 mg/kg/h | INTRAVENOUS | Status: DC
  Filled 2011-10-12: qty 25

## 2011-10-12 MED ORDER — HEPARIN SODIUM (PORCINE) 1000 UNIT/ML IJ SOLN: INTRAMUSCULAR | Status: DC | PRN

## 2011-10-12 MED ORDER — DEXTROSE 5 % IV SOLN
1.5000 g | Freq: Two times a day (BID) | INTRAVENOUS | Status: AC
  Administered 2011-10-13 – 2011-10-14 (×3): 1.5 g via INTRAVENOUS
  Filled 2011-10-12 (×5): qty 1.5

## 2011-10-12 MED ORDER — PHENYLEPHRINE HCL 10 MG/ML IJ SOLN
0.0000 ug/min | INTRAVENOUS | Status: DC
  Administered 2011-10-12: 10 ug/min via INTRAVENOUS
  Filled 2011-10-12: qty 2

## 2011-10-12 MED ORDER — OXYCODONE HCL 5 MG PO TABS
5.0000 mg | ORAL_TABLET | ORAL | Status: DC | PRN
  Administered 2011-10-13: 5 mg via ORAL
  Administered 2011-10-14 – 2011-10-16 (×3): 10 mg via ORAL
  Administered 2011-10-17: 5 mg via ORAL
  Filled 2011-10-12 (×2): qty 1
  Filled 2011-10-12 (×3): qty 2
  Filled 2011-10-12: qty 1

## 2011-10-12 MED ORDER — INSULIN ASPART 100 UNIT/ML ~~LOC~~ SOLN
0.0000 [IU] | SUBCUTANEOUS | Status: AC
  Administered 2011-10-12 (×2): 2 [IU] via SUBCUTANEOUS

## 2011-10-12 MED ORDER — TRANEXAMIC ACID (OHS) BOLUS VIA INFUSION
15.0000 mg/kg | INTRAVENOUS | Status: DC
  Filled 2011-10-12: qty 1062

## 2011-10-12 MED ORDER — VECURONIUM BROMIDE 10 MG IV SOLR
INTRAVENOUS | Status: DC | PRN
  Administered 2011-10-12 (×2): 10 mg via INTRAVENOUS

## 2011-10-12 MED ORDER — THROMBIN 20000 UNITS EX SOLR
OROMUCOSAL | Status: DC | PRN
  Administered 2011-10-12 (×3): via TOPICAL

## 2011-10-12 MED ORDER — SODIUM CHLORIDE 0.9 % IV SOLN
200.0000 ug | INTRAVENOUS | Status: DC | PRN
  Administered 2011-10-12: 0.2 ug/kg/h via INTRAVENOUS

## 2011-10-12 MED ORDER — SODIUM CHLORIDE 0.9 % IV SOLN
INTRAVENOUS | Status: DC
  Filled 2011-10-12: qty 1

## 2011-10-12 MED ORDER — NITROPRUSSIDE SODIUM 25 MG/ML IV SOLN
0.2500 ug/kg/min | INTRAVENOUS | Status: DC
  Administered 2011-10-12: 0.5 ug/kg/min via INTRAVENOUS
  Filled 2011-10-12: qty 2

## 2011-10-12 MED ORDER — METOPROLOL TARTRATE 1 MG/ML IV SOLN: 2.5000 mg | INTRAVENOUS | Status: DC | PRN

## 2011-10-12 MED ORDER — ROCURONIUM BROMIDE 100 MG/10ML IV SOLN
INTRAVENOUS | Status: DC | PRN
  Administered 2011-10-12: 20 mg via INTRAVENOUS
  Administered 2011-10-12: 50 mg via INTRAVENOUS

## 2011-10-12 MED ORDER — CALCIUM CHLORIDE 10 % IV SOLN
1.0000 g | Freq: Once | INTRAVENOUS | Status: AC
  Administered 2011-10-12: 1 g via INTRAVENOUS
  Filled 2011-10-12: qty 10

## 2011-10-12 MED ORDER — VANCOMYCIN HCL 1000 MG IV SOLR
1250.0000 mg | INTRAVENOUS | Status: AC
  Filled 2011-10-12 (×2): qty 1250

## 2011-10-12 MED ORDER — MILRINONE IN DEXTROSE 200-5 MCG/ML-% IV SOLN
0.3000 ug/kg/min | INTRAVENOUS | Status: DC
  Filled 2011-10-12 (×2): qty 100

## 2011-10-12 MED ORDER — ALBUMIN HUMAN 5 % IV SOLN
INTRAVENOUS | Status: AC
  Filled 2011-10-12: qty 250

## 2011-10-12 MED ORDER — PANTOPRAZOLE SODIUM 40 MG PO TBEC
40.0000 mg | DELAYED_RELEASE_TABLET | Freq: Every day | ORAL | Status: DC
  Administered 2011-10-14: 40 mg via ORAL
  Filled 2011-10-12: qty 1

## 2011-10-12 MED ORDER — SODIUM CHLORIDE 0.9 % IJ SOLN: 3.0000 mL | INTRAMUSCULAR | Status: DC | PRN

## 2011-10-12 MED ORDER — DEXMEDETOMIDINE HCL IN NACL 400 MCG/100ML IV SOLN
0.1000 ug/kg/h | INTRAVENOUS | Status: AC
  Administered 2011-10-12: 0.7 ug/kg/h via INTRAVENOUS
  Filled 2011-10-12: qty 100

## 2011-10-12 MED ORDER — DEXTROSE 5 % IV SOLN
750.0000 mg | INTRAVENOUS | Status: DC
  Filled 2011-10-12: qty 750

## 2011-10-12 MED ORDER — 0.9 % SODIUM CHLORIDE (POUR BTL) OPTIME
TOPICAL | Status: DC | PRN
  Administered 2011-10-12: 8000 mL

## 2011-10-12 MED ORDER — LACTATED RINGERS IV SOLN
INTRAVENOUS | Status: DC
  Administered 2011-10-12: 20 mL via INTRAVENOUS

## 2011-10-12 MED ORDER — MIDAZOLAM HCL 2 MG/2ML IJ SOLN
2.0000 mg | INTRAMUSCULAR | Status: DC | PRN
  Administered 2011-10-12 – 2011-10-13 (×4): 2 mg via INTRAVENOUS
  Filled 2011-10-12: qty 2
  Filled 2011-10-12: qty 4
  Filled 2011-10-12 (×2): qty 2

## 2011-10-12 MED ORDER — HEPARIN SODIUM (PORCINE) 1000 UNIT/ML IJ SOLN
INTRAMUSCULAR | Status: DC | PRN
  Administered 2011-10-12: 25000 [IU] via INTRAVENOUS

## 2011-10-12 MED ORDER — LACTATED RINGERS IV SOLN
INTRAVENOUS | Status: DC | PRN
  Administered 2011-10-12: 20:00:00 via INTRAVENOUS

## 2011-10-12 MED ORDER — DEXMEDETOMIDINE HCL IN NACL 200 MCG/50ML IV SOLN
0.1000 ug/kg/h | INTRAVENOUS | Status: DC
  Administered 2011-10-12 (×2): 0.7 ug/kg/h via INTRAVENOUS
  Filled 2011-10-12 (×3): qty 50

## 2011-10-12 MED ORDER — SODIUM CHLORIDE 0.9 % IV SOLN
INTRAVENOUS | Status: DC
  Filled 2011-10-12 (×2): qty 1

## 2011-10-12 MED ORDER — PHENYLEPHRINE HCL 10 MG/ML IJ SOLN
30.0000 ug/min | INTRAVENOUS | Status: DC
  Filled 2011-10-12: qty 2

## 2011-10-12 MED ORDER — METOPROLOL TARTRATE 25 MG/10 ML ORAL SUSPENSION
12.5000 mg | Freq: Two times a day (BID) | ORAL | Status: DC
  Filled 2011-10-12 (×7): qty 5

## 2011-10-12 MED ORDER — POTASSIUM CHLORIDE 10 MEQ/50ML IV SOLN
10.0000 meq | Freq: Once | INTRAVENOUS | Status: AC
  Administered 2011-10-12: 10 meq via INTRAVENOUS

## 2011-10-12 MED ORDER — ONDANSETRON HCL 4 MG/2ML IJ SOLN: 4.0000 mg | Freq: Four times a day (QID) | INTRAMUSCULAR | Status: DC | PRN

## 2011-10-12 MED ORDER — DOPAMINE-DEXTROSE 3.2-5 MG/ML-% IV SOLN
INTRAVENOUS | Status: DC | PRN
  Administered 2011-10-12: 3 ug/kg/min via INTRAVENOUS

## 2011-10-12 MED ORDER — SODIUM CHLORIDE 0.9 % IV SOLN: 250.0000 mL | INTRAVENOUS | Status: DC

## 2011-10-12 MED ORDER — MAGNESIUM SULFATE 40 MG/ML IJ SOLN
4.0000 g | Freq: Once | INTRAMUSCULAR | Status: AC
  Administered 2011-10-12: 4 g via INTRAVENOUS
  Filled 2011-10-12: qty 100

## 2011-10-12 MED ORDER — SODIUM CHLORIDE 0.45 % IV SOLN
INTRAVENOUS | Status: DC
  Administered 2011-10-12: 20 mL via INTRAVENOUS

## 2011-10-12 MED ORDER — BISACODYL 10 MG RE SUPP: 10.0000 mg | Freq: Every day | RECTAL | Status: DC

## 2011-10-12 MED ORDER — MORPHINE SULFATE 2 MG/ML IJ SOLN: 1.0000 mg | INTRAMUSCULAR | Status: AC | PRN

## 2011-10-12 MED ORDER — LACTATED RINGERS IV SOLN
INTRAVENOUS | Status: DC | PRN
  Administered 2011-10-12 (×2): via INTRAVENOUS

## 2011-10-12 MED ORDER — FUROSEMIDE 10 MG/ML IJ SOLN
80.0000 mg | Freq: Once | INTRAMUSCULAR | Status: AC
  Administered 2011-10-12: 80 mg via INTRAVENOUS

## 2011-10-12 MED ORDER — MIDAZOLAM HCL 5 MG/5ML IJ SOLN
INTRAMUSCULAR | Status: DC | PRN
  Administered 2011-10-12 (×3): 2 mg via INTRAVENOUS

## 2011-10-12 MED ORDER — ACETAMINOPHEN 160 MG/5ML PO SOLN
975.0000 mg | Freq: Four times a day (QID) | ORAL | Status: DC
  Administered 2011-10-13: 975 mg
  Filled 2011-10-12: qty 20.3

## 2011-10-12 MED ORDER — METOPROLOL TARTRATE 12.5 MG HALF TABLET
12.5000 mg | ORAL_TABLET | Freq: Two times a day (BID) | ORAL | Status: DC
  Administered 2011-10-13 – 2011-10-15 (×5): 12.5 mg via ORAL
  Filled 2011-10-12 (×7): qty 1

## 2011-10-12 MED ORDER — ALBUMIN HUMAN 5 % IV SOLN: 250.0000 mL | INTRAVENOUS | Status: AC | PRN

## 2011-10-12 MED ORDER — PROPOFOL 10 MG/ML IV BOLUS
INTRAVENOUS | Status: DC | PRN
  Administered 2011-10-12: 100 mg via INTRAVENOUS

## 2011-10-12 MED ORDER — HEPARIN SODIUM (PORCINE) 1000 UNIT/ML IJ SOLN
INTRAMUSCULAR | Status: AC
  Filled 2011-10-12: qty 1

## 2011-10-12 MED ORDER — FENTANYL CITRATE 0.05 MG/ML IJ SOLN
INTRAMUSCULAR | Status: DC | PRN
  Administered 2011-10-12 (×2): 100 ug via INTRAVENOUS
  Administered 2011-10-12: 50 ug via INTRAVENOUS
  Administered 2011-10-12 (×2): 100 ug via INTRAVENOUS
  Administered 2011-10-12: 150 ug via INTRAVENOUS
  Administered 2011-10-12: 50 ug via INTRAVENOUS
  Administered 2011-10-12: 100 ug via INTRAVENOUS

## 2011-10-12 MED ORDER — FAMOTIDINE IN NACL 20-0.9 MG/50ML-% IV SOLN
20.0000 mg | Freq: Two times a day (BID) | INTRAVENOUS | Status: AC
  Administered 2011-10-12 – 2011-10-13 (×2): 20 mg via INTRAVENOUS
  Filled 2011-10-12: qty 50

## 2011-10-12 MED ORDER — SODIUM CHLORIDE 0.9 % IJ SOLN
3.0000 mL | Freq: Two times a day (BID) | INTRAMUSCULAR | Status: DC
  Administered 2011-10-13 – 2011-10-15 (×4): 3 mL via INTRAVENOUS

## 2011-10-12 MED ORDER — POTASSIUM CHLORIDE 10 MEQ/50ML IV SOLN
10.0000 meq | INTRAVENOUS | Status: AC
  Administered 2011-10-12 (×3): 10 meq via INTRAVENOUS

## 2011-10-12 MED ORDER — VITAMIN K1 10 MG/ML IJ SOLN
5.0000 mg | Freq: Once | INTRAVENOUS | Status: AC
  Administered 2011-10-12: 5 mg via INTRAVENOUS
  Filled 2011-10-12: qty 0.5

## 2011-10-12 MED ORDER — TRANEXAMIC ACID 100 MG/ML IV SOLN
2500.0000 mg | INTRAVENOUS | Status: DC | PRN
  Administered 2011-10-12: 1.5 mg/kg/h via INTRAVENOUS

## 2011-10-12 MED ORDER — FENTANYL CITRATE 0.05 MG/ML IJ SOLN
INTRAMUSCULAR | Status: DC | PRN
  Administered 2011-10-12: 100 ug via INTRAVENOUS
  Administered 2011-10-12: 250 ug via INTRAVENOUS
  Administered 2011-10-12: 750 ug via INTRAVENOUS
  Administered 2011-10-12 (×2): 150 ug via INTRAVENOUS
  Administered 2011-10-12: 100 ug via INTRAVENOUS

## 2011-10-12 MED ORDER — BISACODYL 5 MG PO TBEC
10.0000 mg | DELAYED_RELEASE_TABLET | Freq: Every day | ORAL | Status: DC
  Administered 2011-10-13 – 2011-10-20 (×4): 10 mg via ORAL
  Filled 2011-10-12 (×5): qty 2

## 2011-10-12 MED ORDER — TRANEXAMIC ACID (OHS) PUMP PRIME SOLUTION
2.0000 mg/kg | INTRAVENOUS | Status: DC
  Filled 2011-10-12: qty 1.42

## 2011-10-12 MED ORDER — POTASSIUM CHLORIDE 2 MEQ/ML IV SOLN
80.0000 meq | INTRAVENOUS | Status: DC
  Filled 2011-10-12: qty 40

## 2011-10-12 MED ORDER — INSULIN REGULAR BOLUS VIA INFUSION
0.0000 [IU] | Freq: Three times a day (TID) | INTRAVENOUS | Status: DC
  Filled 2011-10-12: qty 10

## 2011-10-12 MED ORDER — NITROGLYCERIN IN D5W 200-5 MCG/ML-% IV SOLN
2.0000 ug/min | INTRAVENOUS | Status: DC
  Filled 2011-10-12: qty 250

## 2011-10-12 MED ORDER — DOCUSATE SODIUM 100 MG PO CAPS
200.0000 mg | ORAL_CAPSULE | Freq: Every day | ORAL | Status: DC
  Administered 2011-10-13 – 2011-10-14 (×2): 200 mg via ORAL
  Filled 2011-10-12 (×2): qty 2

## 2011-10-12 MED ORDER — DOPAMINE-DEXTROSE 3.2-5 MG/ML-% IV SOLN
2.0000 ug/kg/min | INTRAVENOUS | Status: DC
  Filled 2011-10-12: qty 250

## 2011-10-12 MED ORDER — ACETAMINOPHEN 650 MG RE SUPP
650.0000 mg | RECTAL | Status: AC
  Administered 2011-10-12: 650 mg via RECTAL

## 2011-10-12 MED ORDER — MIDAZOLAM HCL 5 MG/5ML IJ SOLN
INTRAMUSCULAR | Status: DC | PRN
  Administered 2011-10-12: 2 mg via INTRAVENOUS
  Administered 2011-10-12: 3 mg via INTRAVENOUS

## 2011-10-12 MED ORDER — SODIUM CHLORIDE 0.9 % IV SOLN
INTRAVENOUS | Status: DC | PRN
  Administered 2011-10-12: 20:00:00 via INTRAVENOUS

## 2011-10-12 MED ORDER — ASPIRIN 81 MG PO CHEW: 324.0000 mg | CHEWABLE_TABLET | Freq: Every day | ORAL | Status: DC

## 2011-10-12 MED ORDER — 0.9 % SODIUM CHLORIDE (POUR BTL) OPTIME
TOPICAL | Status: DC | PRN
  Administered 2011-10-12: 1000 mL

## 2011-10-12 MED ORDER — DOPAMINE-DEXTROSE 3.2-5 MG/ML-% IV SOLN: 0.0000 ug/kg/min | INTRAVENOUS | Status: DC

## 2011-10-12 MED ORDER — SODIUM BICARBONATE 8.4 % IV SOLN
INTRAVENOUS | Status: AC
  Filled 2011-10-12: qty 50

## 2011-10-12 MED ORDER — SODIUM CHLORIDE 0.9 % IV SOLN
100.0000 [IU] | INTRAVENOUS | Status: DC | PRN
  Administered 2011-10-12: 1.3 [IU]/h via INTRAVENOUS

## 2011-10-12 MED ORDER — ACETAMINOPHEN 160 MG/5ML PO SOLN: 650.0000 mg | ORAL | Status: AC

## 2011-10-12 SURGICAL SUPPLY — 57 items
ADAPTER CARDIO PERF ANTE/RETRO (ADAPTER) IMPLANT
ATTRACTOMAT 16X20 MAGNETIC DRP (DRAPES) IMPLANT
BAG DECANTER FOR FLEXI CONT (MISCELLANEOUS) IMPLANT
BLADE STERNUM SYSTEM 6 (BLADE) IMPLANT
BLADE SURG 15 STRL LF DISP TIS (BLADE) IMPLANT
BLADE SURG 15 STRL SS (BLADE)
CANISTER SUCTION 2500CC (MISCELLANEOUS) ×2 IMPLANT
CANNULA GUNDRY RCSP 15FR (MISCELLANEOUS) IMPLANT
CATH ROBINSON RED A/P 18FR (CATHETERS) IMPLANT
CATH THORACIC 36FR (CATHETERS) IMPLANT
CATH THORACIC 36FR RT ANG (CATHETERS) IMPLANT
CLOTH BEACON ORANGE TIMEOUT ST (SAFETY) ×2 IMPLANT
CONT SPEC STER OR (MISCELLANEOUS) IMPLANT
COVER SURGICAL LIGHT HANDLE (MISCELLANEOUS) ×4 IMPLANT
CRADLE DONUT ADULT HEAD (MISCELLANEOUS) ×2 IMPLANT
DRAPE SLUSH MACHINE 52X66 (DRAPES) IMPLANT
DRAPE SLUSH/WARMER DISC (DRAPES) ×2 IMPLANT
DRSG COVADERM 4X14 (GAUZE/BANDAGES/DRESSINGS) ×2 IMPLANT
ELECT CAUTERY BLADE 6.4 (BLADE) ×2 IMPLANT
ELECT REM PT RETURN 9FT ADLT (ELECTROSURGICAL) ×2
ELECTRODE REM PT RTRN 9FT ADLT (ELECTROSURGICAL) ×1 IMPLANT
GLOVE BIO SURGEON STRL SZ 6 (GLOVE) IMPLANT
GLOVE BIO SURGEON STRL SZ 6.5 (GLOVE) ×10 IMPLANT
GLOVE BIO SURGEON STRL SZ7 (GLOVE) IMPLANT
GLOVE BIO SURGEON STRL SZ7.5 (GLOVE) IMPLANT
GLOVE EUDERMIC 7 POWDERFREE (GLOVE) ×2 IMPLANT
GOWN PREVENTION PLUS XLARGE (GOWN DISPOSABLE) ×2 IMPLANT
GOWN STRL NON-REIN LRG LVL3 (GOWN DISPOSABLE) ×4 IMPLANT
HEART VENT LT CURVED (MISCELLANEOUS) IMPLANT
HEMOSTAT POWDER SURGIFOAM 1G (HEMOSTASIS) ×4 IMPLANT
HEMOSTAT SURGICEL 2X14 (HEMOSTASIS) IMPLANT
KIT BASIN OR (CUSTOM PROCEDURE TRAY) ×2 IMPLANT
KIT CATH CPB BARTLE (MISCELLANEOUS) IMPLANT
KIT ROOM TURNOVER OR (KITS) ×2 IMPLANT
KIT SUCTION CATH 14FR (SUCTIONS) ×2 IMPLANT
NS IRRIG 1000ML POUR BTL (IV SOLUTION) ×10 IMPLANT
PACK OPEN HEART (CUSTOM PROCEDURE TRAY) ×2 IMPLANT
PAD ARMBOARD 7.5X6 YLW CONV (MISCELLANEOUS) ×4 IMPLANT
SPONGE GAUZE 4X4 12PLY (GAUZE/BANDAGES/DRESSINGS) ×2 IMPLANT
SUT BONE WAX W31G (SUTURE) ×2 IMPLANT
SUT ETHIBON 2 0 V 52N 30 (SUTURE) IMPLANT
SUT PROLENE 3 0 SH 1 (SUTURE) ×2 IMPLANT
SUT PROLENE 3 0 SH DA (SUTURE) IMPLANT
SUT PROLENE 4 0 RB 1 (SUTURE)
SUT PROLENE 4-0 RB1 .5 CRCL 36 (SUTURE) IMPLANT
SUT STEEL 6MS V (SUTURE) IMPLANT
SUT VIC AB 1 CTX 36 (SUTURE) ×2
SUT VIC AB 1 CTX36XBRD ANBCTR (SUTURE) ×2 IMPLANT
SUTURE E-PAK OPEN HEART (SUTURE) IMPLANT
SYSTEM SAHARA CHEST DRAIN ATS (WOUND CARE) ×2 IMPLANT
TAPE CLOTH SURG 4X10 WHT LF (GAUZE/BANDAGES/DRESSINGS) ×2 IMPLANT
TOWEL OR 17X24 6PK STRL BLUE (TOWEL DISPOSABLE) ×4 IMPLANT
TOWEL OR 17X26 10 PK STRL BLUE (TOWEL DISPOSABLE) ×4 IMPLANT
TRAY FOLEY IC TEMP SENS 14FR (CATHETERS) IMPLANT
TUBE SUCT INTRACARD DLP 20F (MISCELLANEOUS) ×2 IMPLANT
UNDERPAD 30X30 INCONTINENT (UNDERPADS AND DIAPERS) ×2 IMPLANT
WATER STERILE IRR 1000ML POUR (IV SOLUTION) ×4 IMPLANT

## 2011-10-12 SURGICAL SUPPLY — 85 items
ADAPTER CARDIO PERF ANTE/RETRO (ADAPTER) ×2 IMPLANT
ANTEGRADE CPLG (MISCELLANEOUS) ×2 IMPLANT
ATTRACTOMAT 16X20 MAGNETIC DRP (DRAPES) ×2 IMPLANT
BAG DECANTER FOR FLEXI CONT (MISCELLANEOUS) ×2 IMPLANT
BLADE STERNUM SYSTEM 6 (BLADE) ×2 IMPLANT
BLADE SURG 15 STRL LF DISP TIS (BLADE) ×2 IMPLANT
BLADE SURG 15 STRL SS (BLADE) ×2
BLANKET WARM CARDIAC ADLT BAIR (MISCELLANEOUS) ×2 IMPLANT
CANISTER SUCTION 2500CC (MISCELLANEOUS) ×2 IMPLANT
CANN PRFSN 3/8X14X24FR PCFC (MISCELLANEOUS) ×1
CANNULA AORTIC HI-FLOW 6.5M20F (CANNULA) ×2 IMPLANT
CANNULA GUNDRY RCSP 15FR (MISCELLANEOUS) ×2 IMPLANT
CANNULA PRFSN 3/8X14X24FR PCFC (MISCELLANEOUS) ×1 IMPLANT
CANNULA VEN MTL TIP RT (MISCELLANEOUS) ×1
CANNULA VENOUS LOW PROF 34X46 (CANNULA) ×2 IMPLANT
CANNULA VRC MALB SNGL STG 36FR (MISCELLANEOUS) ×1 IMPLANT
CATH ROBINSON RED A/P 18FR (CATHETERS) ×6 IMPLANT
CATH THORACIC 28FR (CATHETERS) ×4 IMPLANT
CATH THORACIC 36FR (CATHETERS) ×2 IMPLANT
CATH THORACIC 36FR RT ANG (CATHETERS) ×2 IMPLANT
CLOTH BEACON ORANGE TIMEOUT ST (SAFETY) ×2 IMPLANT
CONN 1/2X1/2X1/2  BEN (MISCELLANEOUS) ×1
CONN 1/2X1/2X1/2 BEN (MISCELLANEOUS) ×1 IMPLANT
CONN Y 3/8X3/8X3/8  BEN (MISCELLANEOUS) ×1
CONN Y 3/8X3/8X3/8 BEN (MISCELLANEOUS) ×1 IMPLANT
CONT SPEC STER OR (MISCELLANEOUS) ×2 IMPLANT
COVER SURGICAL LIGHT HANDLE (MISCELLANEOUS) ×4 IMPLANT
CRADLE DONUT ADULT HEAD (MISCELLANEOUS) ×2 IMPLANT
DRAPE SLUSH MACHINE 52X66 (DRAPES) IMPLANT
DRAPE SLUSH/WARMER DISC (DRAPES) IMPLANT
DRSG COVADERM 4X14 (GAUZE/BANDAGES/DRESSINGS) ×2 IMPLANT
ELECT CAUTERY BLADE 6.4 (BLADE) ×2 IMPLANT
ELECT REM PT RETURN 9FT ADLT (ELECTROSURGICAL) ×4
ELECTRODE REM PT RTRN 9FT ADLT (ELECTROSURGICAL) ×2 IMPLANT
GLOVE BIO SURGEON STRL SZ 6 (GLOVE) IMPLANT
GLOVE BIO SURGEON STRL SZ 6.5 (GLOVE) IMPLANT
GLOVE BIO SURGEON STRL SZ7 (GLOVE) IMPLANT
GLOVE BIO SURGEON STRL SZ7.5 (GLOVE) IMPLANT
GLOVE EUDERMIC 7 POWDERFREE (GLOVE) ×4 IMPLANT
GOWN PREVENTION PLUS XLARGE (GOWN DISPOSABLE) ×2 IMPLANT
GOWN STRL NON-REIN LRG LVL3 (GOWN DISPOSABLE) ×8 IMPLANT
HEART VENT LT CURVED (MISCELLANEOUS) ×2 IMPLANT
HEMOSTAT POWDER SURGIFOAM 1G (HEMOSTASIS) ×6 IMPLANT
HEMOSTAT SURGICEL 2X14 (HEMOSTASIS) ×2 IMPLANT
KIT BASIN OR (CUSTOM PROCEDURE TRAY) ×2 IMPLANT
KIT CATH CPB BARTLE (MISCELLANEOUS) ×2 IMPLANT
KIT ROOM TURNOVER OR (KITS) ×2 IMPLANT
KIT SUCTION CATH 14FR (SUCTIONS) ×2 IMPLANT
LINE VENT (MISCELLANEOUS) ×2 IMPLANT
LOOP VESSEL SUPERMAXI WHITE (MISCELLANEOUS) ×2 IMPLANT
NS IRRIG 1000ML POUR BTL (IV SOLUTION) ×12 IMPLANT
PACK OPEN HEART (CUSTOM PROCEDURE TRAY) ×2 IMPLANT
PAD ARMBOARD 7.5X6 YLW CONV (MISCELLANEOUS) ×4 IMPLANT
RING MITRAL MEMO 3D 28MM SMD28 (Prosthesis & Implant Heart) ×2 IMPLANT
SET CARDIOPLEGIA MPS 5001102 (MISCELLANEOUS) ×2 IMPLANT
SPONGE GAUZE 4X4 12PLY (GAUZE/BANDAGES/DRESSINGS) ×2 IMPLANT
STOPCOCK 4 WAY LG BORE MALE ST (IV SETS) ×2 IMPLANT
SUCKER INTRACARDIAC WEIGHTED (SUCKER) ×2 IMPLANT
SUT BONE WAX W31G (SUTURE) ×2 IMPLANT
SUT ETHIBON 2 0 V 52N 30 (SUTURE) ×4 IMPLANT
SUT ETHIBOND 2 0 SH (SUTURE) ×7 IMPLANT
SUT ETHIBOND 2 0 SH 36X2 (SUTURE) ×1 IMPLANT
SUT PROLENE 3 0 RB 1 (SUTURE) ×2 IMPLANT
SUT PROLENE 3 0 SH 1 (SUTURE) ×2 IMPLANT
SUT PROLENE 3 0 SH DA (SUTURE) IMPLANT
SUT PROLENE 4 0 RB 1 (SUTURE) ×11
SUT PROLENE 4-0 RB1 .5 CRCL 36 (SUTURE) ×10 IMPLANT
SUT PROLENE 4-0 RB1 18X2 ARM (SUTURE) ×1 IMPLANT
SUT SILK  1 MH (SUTURE) ×1
SUT SILK 1 MH (SUTURE) ×1 IMPLANT
SUT SILK 2 0 SH CR/8 (SUTURE) ×2 IMPLANT
SUT STEEL 6MS V (SUTURE) ×4 IMPLANT
SUT VIC AB 1 CTX 36 (SUTURE) ×3
SUT VIC AB 1 CTX36XBRD ANBCTR (SUTURE) ×3 IMPLANT
SUTURE E-PAK OPEN HEART (SUTURE) ×2 IMPLANT
SYSTEM SAHARA CHEST DRAIN ATS (WOUND CARE) ×2 IMPLANT
TAPE CLOTH SURG 4X10 WHT LF (GAUZE/BANDAGES/DRESSINGS) ×2 IMPLANT
TOWEL OR 17X24 6PK STRL BLUE (TOWEL DISPOSABLE) ×4 IMPLANT
TOWEL OR 17X26 10 PK STRL BLUE (TOWEL DISPOSABLE) ×4 IMPLANT
TRAY FOLEY IC TEMP SENS 14FR (CATHETERS) ×2 IMPLANT
TUBE SUCT INTRACARD DLP 20F (MISCELLANEOUS) ×2 IMPLANT
UNDERPAD 30X30 INCONTINENT (UNDERPADS AND DIAPERS) ×2 IMPLANT
VALVE AORTIC MAGNA 21MM (Prosthesis & Implant Heart) ×2 IMPLANT
VRC MALLEABLE SINGLE STG 36FR (MISCELLANEOUS) ×2
WATER STERILE IRR 1000ML POUR (IV SOLUTION) ×4 IMPLANT

## 2011-10-12 NOTE — Anesthesia Postprocedure Evaluation (Signed)
  Anesthesia Post-op Note  Patient: [name]  Procedure(s) Performed: Procedure(s) (LRB): EXPLORATION POST OPERATIVE OPEN HEART (N/A)  Patient Location: SICU  Anesthesia Type: General  Level of Consciousness: sedated, unresponsive and Patient remains intubated per anesthesia plan  Airway and Oxygen Therapy: Patient remains intubated per anesthesia plan and Patient placed on Ventilator (see vital sign flow sheet for setting)  Post-op Pain: none  Post-op Assessment: Post-op Vital signs reviewed  Post-op Vital Signs: Reviewed and stable  Complications: No apparent anesthesia complications

## 2011-10-12 NOTE — Anesthesia Preprocedure Evaluation (Signed)
Anesthesia Evaluation  Patient identified by MRN, date of birth, ID band Patient awake    Reviewed: Allergy & Precautions, H&P , NPO status , Patient's Chart, lab work & pertinent test results, reviewed documented beta blocker date and time   Airway Mallampati: II TM Distance: >3 FB Neck ROM: full    Dental   Pulmonary shortness of breath,  breath sounds clear to auscultation        Cardiovascular hypertension, +CHF + Valvular Problems/Murmurs Rhythm:regular     Neuro/Psych negative neurological ROS  negative psych ROS   GI/Hepatic negative GI ROS, Neg liver ROS,   Endo/Other  negative endocrine ROS  Renal/GU negative Renal ROS  negative genitourinary   Musculoskeletal   Abdominal   Peds  Hematology negative hematology ROS (+)   Anesthesia Other Findings See surgeon's H&P   Reproductive/Obstetrics negative OB ROS                           Anesthesia Physical Anesthesia Plan  ASA: IV and Emergent  Anesthesia Plan: General   Post-op Pain Management:    Induction:   Airway Management Planned: Oral ETT  Additional Equipment: Arterial line and PA Cath  Intra-op Plan:   Post-operative Plan: Post-operative intubation/ventilation  Informed Consent: I have reviewed the patients History and Physical, chart, labs and discussed the procedure including the risks, benefits and alternatives for the proposed anesthesia with the patient or authorized representative who has indicated his/her understanding and acceptance.   Dental Advisory Given  Plan Discussed with: CRNA and Surgeon  Anesthesia Plan Comments: (Admit direct from ICU for bleeding)        Anesthesia Quick Evaluation

## 2011-10-12 NOTE — Progress Notes (Signed)
Patient ID: [name], female   DOB: December 23, 1940, 71 y.o.   MRN: [id number]   She has had postop coagulopathy that is now corrected with FFP, cryo, and platelets.  She still have some bright red blood coming from the right pleural chest tube and this is likely due to one of the sternal wires.  Bleeding from the mediastinal and left pleural tubes has stopped.  I will take patient back to the OR for exploration to stop this bleeding. Discussed with her sister by telephone.

## 2011-10-12 NOTE — Anesthesia Preprocedure Evaluation (Addendum)
Anesthesia Evaluation  Patient identified by MRN, date of birth, ID band Patient awake    Reviewed: Allergy & Precautions  Airway Mallampati: I TM Distance: >3 FB Neck ROM: Full    Dental  (+) Teeth Intact   Pulmonary shortness of breath,          Cardiovascular hypertension, Pt. on medications +CHF + Valvular Problems/Murmurs AI Rhythm:Regular Rate:Bradycardia     Neuro/Psych    GI/Hepatic   Endo/Other    Renal/GU      Musculoskeletal   Abdominal   Peds  Hematology   Anesthesia Other Findings   Reproductive/Obstetrics                           Anesthesia Physical Anesthesia Plan  ASA: IV  Anesthesia Plan: General   Post-op Pain Management:    Induction: Intravenous  Airway Management Planned: Oral ETT  Additional Equipment: Arterial line, CVP, PA Cath and TEE  Intra-op Plan:   Post-operative Plan: Post-operative intubation/ventilation  Informed Consent: I have reviewed the patients History and Physical, chart, labs and discussed the procedure including the risks, benefits and alternatives for the proposed anesthesia with the patient or authorized representative who has indicated his/her understanding and acceptance.   Dental advisory given  Plan Discussed with: Surgeon and CRNA  Anesthesia Plan Comments:        Anesthesia Quick Evaluation

## 2011-10-12 NOTE — H&P (Signed)
[address]            [name] [address]          [phone]     PCP is No primary provider on file. Referring Provider is Pamella Pert, MD    Chief Complaint   Patient presents with   .  Aortic Insuffiency       Referral from Dr Jacinto Halim for eval on severe aortic regurgitation, 2D Echo 08/07/11 and 09/04/11, Cardiac Cath on 09/11/11        HPI:   The patient is a 71 year old African American female with a history of hypertension who said that she was very active and feeling fine until April 2013 when she began developing progressively worsening dyspnea and generalized anasarca especially in her legs. She initially thought that she had arthritis developing because of stiffness in her legs. She had an echocardiogram done on 08/17/2011 that showed moderate aortic insufficiency with right ventricular dysfunction. She was started on diuretics due to worsening symptoms and she was admitted to the hospital for acute on chronic diastolic heart failure. She was treated with intravenous diuretics with slow improvement. She was noted to have a moderate to large left pleural effusion and underwent an ultrasound-guided thoracentesis showing this to be a transudate. She also had ascites and underwent paracentesis. Cultures were negative. She was discharged on diuretics and said that she has continued to improve. She no longer has shortness of breath. She said her weight went from 192 pounds down to 145 pounds with diuresis. A 2-D echocardiogram on 09/04/2011 showed left ventricular cavity size be normal. There was moderate concentric left hypertrophy. There was abnormal septal wall motion due to right ventricular volume overload. Left ventricular ejection fraction was estimated at 40% with global hypokinesis. There was grade 2 diastolic dysfunction. The left and right atrial cavities were severely dilated. The right ventricular cavity was moderately enlarged with  severe global hypokinesis. There was moderate to severe aortic insufficiency but appeared worse than on the prior echocardiogram. There was mild to moderate mitral regurgitation with a structurally normal mitral valve. There was mild to moderate tricuspid regurgitation with a structurally normal tricuspid valve. There was evidence of mild pulmonary hypertension. There was a large left pleural effusion. She subsequently underwent a left and right heart catheterization on 09/11/2011 that showed normal coronary arteries. There is severe aortic insufficiency with a normal sized ascending aorta. Left ventricular ejection fraction is 55-60%. PA pressure was 64/30 with a mean of 43. Pulmonary artery saturation is 48%. The pulmonary capillary wedge pressure was 32/34 with a mean of 30. Aortic saturation is 91%. Right ventricular pressure was 63/13 with a right ventricular end-diastolic pressure of 23. Right atrial pressure was 23/21 with a mean of 17 and a right atrial saturation of 55%. Cardiac output was 3.0 with an index of 2.07 by Fick. Cardiac output was 2.8 with an index of 1.4 by thermaodilution.    Past Medical History   Diagnosis  Date   .  Hypertension     .  GERD (gastroesophageal reflux disease)          History reviewed. No pertinent past surgical history.    Family History   Problem  Relation  Age of Onset   .  Hypertension  Mother          Social History History   Substance  Use Topics   .  Smoking status:  Never Smoker    .  Smokeless tobacco:  Not on file   .  Alcohol Use:  No         Current Outpatient Prescriptions   Medication  Sig  Dispense  Refill   .  aspirin 81 MG tablet  Take 81 mg by mouth daily.         .  furosemide (LASIX) 40 MG tablet  Take 40 mg by mouth daily.          Marland Kitchen  losartan (COZAAR) 50 MG tablet  Take 50 mg by mouth 2 (two) times daily.          .  metoprolol tartrate (LOPRESSOR) 25 MG tablet  Take 25 mg by mouth 2 (two) times daily. 12.5 mg po BID  (1/2 tab of 25 mg)         .  potassium chloride SA (K-DUR,KLOR-CON) 20 MEQ tablet  Take 20 mEq by mouth daily.          Marland Kitchen  spironolactone (ALDACTONE) 25 MG tablet  Take 25 mg by mouth daily.              No Known Allergies   Review of Systems  Constitutional: Positive for activity change, fatigue and unexpected weight change. Negative for fever, chills, diaphoresis and appetite change.        40 lb wt gain over 1 month prior to presentation. Has subsequently lost 52 lbs with diuresis.  HENT: Negative.   Eyes: Negative.   Respiratory: Positive for shortness of breath.   Cardiovascular: Positive for leg swelling.  Gastrointestinal: Positive for abdominal pain and abdominal distention. Negative for nausea, vomiting, diarrhea, constipation and blood in stool.  Genitourinary: Negative.   Musculoskeletal: Negative.   Skin: Negative.   Neurological: Negative.   Hematological: Negative.   Psychiatric/Behavioral: Negative.       BP 121/57  Pulse 52  Resp 18  Ht 5\' 4"  (1.626 m)  Wt 162 lb (73.483 kg)  BMI 27.81 kg/m2  SpO2 98% Physical Exam  Constitutional: She is oriented to person, place, and time. She appears well-developed and well-nourished. No distress.  HENT:   Head: Normocephalic and atraumatic.   Mouth/Throat: Oropharynx is clear and moist. No oropharyngeal exudate.  Eyes: Conjunctivae and EOM are normal. Pupils are equal, round, and reactive to light.  Neck: Normal range of motion. Neck supple. No JVD present. No tracheal deviation present. No thyromegaly present.  Cardiovascular: Normal rate and regular rhythm.    Murmur heard.      III/IV diastolic AI murmur heard throughout precordium.  Pulmonary/Chest: Effort normal. No respiratory distress. She has no rales.       Diminished breath sounds at left base.  Abdominal: Soft. Bowel sounds are normal. She exhibits no distension and no mass. There is no tenderness.  Musculoskeletal: She exhibits edema.       Marked  brawny edema to knees bilat.  Lymphadenopathy:    She has no cervical adenopathy.  Neurological: She is alert and oriented to person, place, and time. She has normal strength. No cranial nerve deficit or sensory deficit.  Skin: Skin is warm and dry.       Diffuse pigmented skin lesions throughout trunk  Psychiatric: She has a normal mood and affect.        Diagnostic Tests:    Left Heart Catheterization    First a 5 [place] JL 4 catheter was advanced  over standard J-wire into the ascending aorta and used to engage first the Left and a JR 4 catheter used to engage  Right Coronary Artery. Multiple cineangiographic views of the Left then Right Coronary Artery system(s) were performed.   A 5  French angled pigtail catheter which was used to cross the aortic valve for measurement Left Ventricular Hemodynamics. Left ventriculography was then performed in the RAO projection. Hemodynamics were then resampled and the catheter pulled back across the aortic valve for measurement of "pullback" gradient. The catheter was then removed the body over wire. All exchanges were made over standard J wire.    Procedural data:   RA pressure 23/21  Mean 17 mm mercury. RA saturation 55%.   RV pressure 63/13 and Right ventricular EDP 23 mm Hg. PA pressure 64/30 with a mean of 43  mm mercury. PA saturation 48%.   Pulmonary capillary wedge 32/34 with a mean of 30 mm Hg. Aortic saturation 91%.   Cardiac output was 3.0 with cardiac index of 2.07  by Fick. And 2.8 and 1.4 by thermodilution   Angiographic data   Left ventricle:  performed.  Left systolic shows normal ejection fraction of 55-60%    Right coronary artery: Smooth, dominant and normal    Left main coronary artery: Normal. No stenosis.    LAD: Large, smooth and normal. Gives origin to a moderate sized diagonal 1 which is smooth and normal.    Circumflex coronary artery: Smooth normal. Non-dominant.   Ascending Aortogram: Severe aortic  regurgitation noted.    Impression: Normal left heart catheterizaton with normal coronary arteries. Markedly elevated LV end diastolic pressure due to aortic regurgitation.   Severe aortic regurgitation.   Right Heart Cath:  Revealing severe pulmonary hypertension with preserved cardiac output and cardiac index.      Recommendation: I discussed the findings of the precatheterization with patient. Patient will be followed up in the office in 2 weeks for access site evaluation. There was no immediate complication.    I will refer her for aortic valve replacement. I suspect her acute decompensated congestive heart failure with evidence of pleural effusion and leg edema is probably due to severe aortic regurgitation.              Impression/Plan:   She has severe aortic insufficiency with biventricular dysfunction and predominant right heart failure. She has mild to moderate mitral and tricuspid regurgitation with structurally normal valves by echocardiogram with severe pulmonary hypertension. She has improved dramatically with diuresis and has preserved cardiac index so I think aortic valve replacement is probably the best treatment for her. I would plan to use a tissue valve given her age. She will require intraoperative evaluation of the mitral and tricuspid valves with TEE to be sure that no intervention is needed for those. I discussed the operative procedure with the patient including alternatives, benefits and risks; including but not limited to bleeding, blood transfusion, infection, stroke, myocardial infarction, graft failure, heart block requiring a permanent pacemaker, organ dysfunction, and death.  Herbert Seta understands and agrees to proceed.  We will schedule surgery for Thursday 10/12/11.

## 2011-10-12 NOTE — Anesthesia Postprocedure Evaluation (Signed)
Anesthesia Post Note  Patient: [name]  Procedure(s) Performed: Procedure(s) (LRB): AORTIC VALVE REPLACEMENT (AVR) (N/A) MITRAL VALVE REPAIR (MVR) (N/A)  Anesthesia type: General  Patient location: ICU  Post pain: Pain level controlled  Post assessment: Post-op Vital signs reviewed  Last Vitals:  Filed Vitals:   10/12/11 1337  BP:   Pulse: 80  Temp: 34.8 C  Resp: 20    Post vital signs: stable  Level of consciousness: Patient remains intubated per anesthesia plan  Complications: No apparent anesthesia complications

## 2011-10-12 NOTE — Progress Notes (Signed)
  Echocardiogram Echocardiogram Transesophageal has been performed.  [name] 10/12/2011, 8:54 AM

## 2011-10-12 NOTE — Brief Op Note (Signed)
10/12/2011  9:58 PM  PATIENT:  Herbert Seta  71 y.o. female  PRE-OPERATIVE DIAGNOSIS:  Mediastinal bleeding  POST-OPERATIVE DIAGNOSIS:  mediastinal bleeding  PROCEDURE:  Procedure(s) (LRB): EXPLORATION POST OPERATIVE OPEN HEART (N/A)  SURGEON:  Surgeon(s) and Role:    * Alleen Borne, MD - Primary  PHYSICIAN ASSISTANT: none   ASSISTANTS: Benay Spice, RNFA   ANESTHESIA:   general  EBL:  Total I/O In: 2834 [I.V.:850; Blood:1984] Out: 825 [Urine:325; Blood:500]  Findings:  Diffuse bleeding from sternum. Large amount of clotted blood in mediastinum and right pleural space. Coagulopathic.  To SICU stable

## 2011-10-12 NOTE — Preoperative (Signed)
Beta Blockers   Reason not to administer Beta Blockers:Not Applicable 

## 2011-10-12 NOTE — Interval H&P Note (Signed)
History and Physical Interval Note:  10/12/2011 7:24 AM  [name]  has presented today for surgery, with the diagnosis of SEVERE AORTIC INSUFFICENCY  The various methods of treatment have been discussed with the patient and family. After consideration of risks, benefits and other options for treatment, the patient has consented to  Procedure(s) (LRB): AORTIC VALVE REPLACEMENT (AVR) (N/A) as a surgical intervention .  The patient's history has been reviewed, patient examined, no change in status, stable for surgery.  I have reviewed the patient's chart and labs.  Questions were answered to the patient's satisfaction.     Alleen Borne

## 2011-10-12 NOTE — Transfer of Care (Signed)
Immediate Anesthesia Transfer of Care Note  Patient: [name]  Procedure(s) Performed: Procedure(s) (LRB): AORTIC VALVE REPLACEMENT (AVR) (N/A) MITRAL VALVE REPAIR (MVR) (N/A)  Patient Location: SICU  Anesthesia Type: General  Level of Consciousness: Patient remains intubated per anesthesia plan  Airway & Oxygen Therapy: Patient remains intubated per anesthesia plan and Patient placed on Ventilator (see vital sign flow sheet for setting)  Post-op Assessment: Report given to PACU RN and Post -op Vital signs reviewed and stable  Post vital signs: Reviewed and stable  Complications: No apparent anesthesia complications

## 2011-10-12 NOTE — Transfer of Care (Signed)
Immediate Anesthesia Transfer of Care Note  Patient: [name]  Procedure(s) Performed: Procedure(s) (LRB): EXPLORATION POST OPERATIVE OPEN HEART (N/A)  Patient Location: SICU  Anesthesia Type: General  Level of Consciousness: sedated, unresponsive and Patient remains intubated per anesthesia plan  Airway & Oxygen Therapy: Patient remains intubated per anesthesia plan and Patient placed on Ventilator (see vital sign flow sheet for setting)  Post-op Assessment: Post -op Vital signs reviewed and stable  Post vital signs: Reviewed and stable  Complications: No apparent anesthesia complications

## 2011-10-12 NOTE — Brief Op Note (Signed)
10/12/2011  1:50 PM  PATIENT:  [name]  71 y.o. female  PRE-OPERATIVE DIAGNOSIS:  SEVERE AORTIC INSUFFICIENCY  POST-OPERATIVE DIAGNOSIS:  SEVERE AORTIC INSUFFICIENCY, Moderate mitral insufficiency  PROCEDURE:  Procedure(s) (LRB): AORTIC VALVE REPLACEMENT (AVR) (N/A) MITRAL VALVE REPAIR (MVR) (N/A)  SURGEON:  Surgeon(s) and Role:    * Alleen Borne, MD - Primary  PHYSICIAN ASSISTANT:   ASSISTANTS: Benay Spice, RNFA   ANESTHESIA:   general  EBL:  Total I/O In: 3463 [I.V.:2200; Blood:1263] Out: 2475 [Urine:1325; Blood:1150]   Off bypass on Dop 3, Milrinone 0.3.  CO= 5  PAP normal  TEE prebypass:  Severe AI, Moderate MR, no TR, RV dilitation and hypokinesis  TEE postbypass:  No MR, no TR, RV improved.  Normal PAP

## 2011-10-12 NOTE — OR Nursing (Signed)
First call SICU @1211 °

## 2011-10-13 ENCOUNTER — Other Ambulatory Visit: Payer: Self-pay

## 2011-10-13 ENCOUNTER — Inpatient Hospital Stay (HOSPITAL_COMMUNITY): Payer: Medicare Other

## 2011-10-13 ENCOUNTER — Encounter (HOSPITAL_COMMUNITY): Payer: Self-pay | Admitting: Surgery

## 2011-10-13 LAB — BASIC METABOLIC PANEL
BUN: 22 mg/dL (ref 6–23)
Calcium: 8.7 mg/dL (ref 8.4–10.5)
Chloride: 107 mEq/L (ref 96–112)
Creatinine, Ser: 0.89 mg/dL (ref 0.50–1.10)
GFR calc Af Amer: 74 mL/min — ABNORMAL LOW (ref >90)
GFR calc non Af Amer: 64 mL/min — ABNORMAL LOW (ref >90)

## 2011-10-13 LAB — PREPARE CRYOPRECIPITATE
Unit division: 0
Unit division: 0

## 2011-10-13 LAB — PREPARE FRESH FROZEN PLASMA
Unit division: 0
Unit division: 0
Unit division: 0
Unit division: 0

## 2011-10-13 LAB — CREATININE, SERUM: Creatinine, Ser: 1.04 mg/dL (ref 0.50–1.10)

## 2011-10-13 LAB — GLUCOSE, CAPILLARY
Glucose-Capillary: 110 mg/dL — ABNORMAL HIGH (ref 70–99)
Glucose-Capillary: 132 mg/dL — ABNORMAL HIGH (ref 70–99)
Glucose-Capillary: 142 mg/dL — ABNORMAL HIGH (ref 70–99)
Glucose-Capillary: 146 mg/dL — ABNORMAL HIGH (ref 70–99)
Glucose-Capillary: 175 mg/dL — ABNORMAL HIGH (ref 70–99)
Glucose-Capillary: 190 mg/dL — ABNORMAL HIGH (ref 70–99)
Glucose-Capillary: 89 mg/dL (ref 70–99)

## 2011-10-13 LAB — POCT I-STAT 3, ART BLOOD GAS (G3+)
Bicarbonate: 27.2 mEq/L — ABNORMAL HIGH (ref 20.0–24.0)
Bicarbonate: 28.1 mEq/L — ABNORMAL HIGH (ref 20.0–24.0)
O2 Saturation: 100 %
Patient temperature: 35.8
Patient temperature: 35.9
TCO2: 29 mmol/L (ref 0–100)
TCO2: 30 mmol/L (ref 0–100)
pCO2 arterial: 42.8 mmHg (ref 35.0–45.0)
pH, Arterial: 7.405 (ref 7.350–7.450)
pH, Arterial: 7.372 (ref 7.350–7.450)
pO2, Arterial: 156 mmHg — ABNORMAL HIGH (ref 80.0–100.0)

## 2011-10-13 LAB — CBC
Hemoglobin: 9.7 g/dL — ABNORMAL LOW (ref 12.0–15.0)
MCH: 29.6 pg (ref 26.0–34.0)
MCH: 29.6 pg (ref 26.0–34.0)
MCH: 30 pg (ref 26.0–34.0)
MCHC: 34 g/dL (ref 30.0–36.0)
MCHC: 35 g/dL (ref 30.0–36.0)
MCHC: 35.3 g/dL (ref 30.0–36.0)
MCV: 84.6 fL (ref 78.0–100.0)
MCV: 87 fL (ref 78.0–100.0)
Platelets: 104 10*3/uL — ABNORMAL LOW (ref 150–400)
Platelets: 105 10*3/uL — ABNORMAL LOW (ref 150–400)
Platelets: 135 10*3/uL — ABNORMAL LOW (ref 150–400)
RBC: 3.78 MIL/uL — ABNORMAL LOW (ref 3.87–5.11)
RDW: 15.1 % (ref 11.5–15.5)
RDW: 16 % — ABNORMAL HIGH (ref 11.5–15.5)
WBC: 5.7 10*3/uL (ref 4.0–10.5)

## 2011-10-13 LAB — PROTIME-INR
INR: 1.29 (ref 0.00–1.49)
Prothrombin Time: 16.3 seconds — ABNORMAL HIGH (ref 11.6–15.2)

## 2011-10-13 LAB — MAGNESIUM: Magnesium: 2.1 mg/dL (ref 1.5–2.5)

## 2011-10-13 LAB — PREPARE PLATELET PHERESIS
Unit division: 0
Unit division: 0

## 2011-10-13 LAB — POCT I-STAT, CHEM 8
Chloride: 103 mEq/L (ref 96–112)
Glucose, Bld: 149 mg/dL — ABNORMAL HIGH (ref 70–99)
HCT: 32 % — ABNORMAL LOW (ref 36.0–46.0)
Potassium: 4.2 mEq/L (ref 3.5–5.1)
Sodium: 142 mEq/L (ref 135–145)

## 2011-10-13 LAB — POCT I-STAT 4, (NA,K, GLUC, HGB,HCT)
Hemoglobin: 8.5 g/dL — ABNORMAL LOW (ref 12.0–15.0)
Sodium: 143 mEq/L (ref 135–145)

## 2011-10-13 MED ORDER — FUROSEMIDE 10 MG/ML IJ SOLN
40.0000 mg | Freq: Two times a day (BID) | INTRAMUSCULAR | Status: AC
  Administered 2011-10-13 – 2011-10-14 (×4): 40 mg via INTRAVENOUS
  Filled 2011-10-13 (×2): qty 4

## 2011-10-13 MED ORDER — POTASSIUM CHLORIDE 10 MEQ/50ML IV SOLN
INTRAVENOUS | Status: AC
  Filled 2011-10-13: qty 150

## 2011-10-13 MED ORDER — ENSURE COMPLETE PO LIQD
237.0000 mL | Freq: Two times a day (BID) | ORAL | Status: DC
  Administered 2011-10-13 – 2011-10-17 (×7): 237 mL via ORAL

## 2011-10-13 MED ORDER — POTASSIUM CHLORIDE 10 MEQ/50ML IV SOLN
10.0000 meq | INTRAVENOUS | Status: AC
  Administered 2011-10-13 (×3): 10 meq via INTRAVENOUS
  Filled 2011-10-13: qty 50

## 2011-10-13 MED ORDER — POTASSIUM CHLORIDE 10 MEQ/50ML IV SOLN
10.0000 meq | INTRAVENOUS | Status: AC
  Administered 2011-10-13 – 2011-10-14 (×4): 10 meq via INTRAVENOUS

## 2011-10-13 MED ORDER — INSULIN ASPART 100 UNIT/ML ~~LOC~~ SOLN
0.0000 [IU] | SUBCUTANEOUS | Status: DC
  Administered 2011-10-13 – 2011-10-15 (×3): 2 [IU] via SUBCUTANEOUS

## 2011-10-13 NOTE — Progress Notes (Addendum)
1 Day Post-Op Procedure(s) (LRB): EXPLORATION POST OPERATIVE OPEN HEART (N/A) Subjective: No complaints. Says she feels better  Objective: Vital signs in last 24 hours: Temp:  [94.3 F (34.6 C)-98.6 F (37 C)] 96.6 F (35.9 C) (08/23 0700) Pulse Rate:  [78-80] 80  (08/23 0700) Cardiac Rhythm:  [-] Atrial paced (08/23 0600) Resp:  [0-26] 12  (08/23 0700) BP: (61-121)/(46-88) 86/56 mmHg (08/23 0700) SpO2:  [98 %-100 %] 100 % (08/23 0700) Arterial Line BP: (69-139)/(47-89) 107/59 mmHg (08/23 0700) FiO2 (%):  [40 %-50 %] 40 % (08/23 0525) Weight:  [70.761 kg (156 lb)-72.2 kg (159 lb 2.8 oz)] 72.2 kg (159 lb 2.8 oz) (08/23 0630)  Hemodynamic parameters for last 24 hours: PAP: (25-69)/(15-28) 41/17 mmHg CO:  [2.2 L/min-3.7 L/min] 3.3 L/min CI:  [1.3 L/min/m2-2.1 L/min/m2] 1.9 L/min/m2  Intake/Output from previous day: 08/22 0701 - 08/23 0700 In: 12036.9 [I.V.:4821.9; ZOXWR:6045; IV Piggyback:843] Out: 8945 [Urine:4960; Blood:1650; Chest Tube:2335] Intake/Output this shift:    General appearance: slowed mentation Neurologic: intact Heart: regular rate and rhythm Lungs: clear to auscultation bilaterally Extremities: edema moderate peripheral Wound: dressing dry  Lab Results:  Basename 10/13/11 0445 10/12/11 2337  WBC 5.7 5.3  HGB 9.6* 9.7*  HCT 27.4* 27.5*  PLT 105* 104*   BMET:  Basename 10/13/11 0445 10/12/11 2210 10/10/11 1507  NA 144 145 --  K 3.6 3.8 --  CL 107 107 --  CO2 27 -- 23  GLUCOSE 139* 150* --  BUN 22 22 --  CREATININE 0.89 0.80 --  CALCIUM 8.7 -- 9.8    PT/INR:  Basename 10/12/11 2337  LABPROT 16.3*  INR 1.29   ABG    Component Value Date/Time   PHART 7.372 10/13/2011 0643   HCO3 28.1* 10/13/2011 0643   TCO2 30 10/13/2011 0643   ACIDBASEDEF 0.0 10/10/2011 1507   O2SAT 100.0 10/13/2011 0643   CBG (last 3)   Basename 10/13/11 0610 10/13/11 0507 10/13/11 0404  GLUCAP 116* 131* 142*    Assessment/Plan: S/P Procedure(s) (LRB): EXPLORATION  POST OPERATIVE OPEN HEART (N/A) Mobilize Diuresis Diabetes control d/c tubes/lines Continue foley due to patient in ICU and urinary output monitoring Progression orders. Will hold off on coumadin for a few days with periop coagulopathy, probable liver dysfunction due to preop right heart failure.  LOS: 1 day    BARTLE,BRYAN K 10/13/2011   CXR:  Clear  ECG:  Sinus vs ectopic atrial brady 55

## 2011-10-13 NOTE — Progress Notes (Signed)
Patient ID: [name], female   DOB: 08-14-1940, 71 y.o.   MRN: [id number]                   [address]            [name] [address]          [phone]     1 Day Post-Op Procedure(s) (LRB): EXPLORATION POST OPERATIVE OPEN HEART (N/A) AVR/mitral ring  Total Length of Stay:  LOS: 1 day  BP 124/75  Pulse 80  Temp 98.3 F (36.8 C) (Oral)  Resp 4  Ht 5\' 4"  (1.626 m)  Wt 159 lb 2.8 oz (72.2 kg)  BMI 27.32 kg/m2  SpO2 100%     . sodium chloride 20 mL (10/12/11 1800)  . sodium chloride 20 mL (10/12/11 1345)  . sodium chloride    . lactated ringers 20 mL (10/12/11 1345)  . milrinone Stopped (10/13/11 0900)  . nitroGLYCERIN Stopped (10/13/11 0500)  . nitroPRUSSide Stopped (10/13/11 0345)  . phenylephrine (NEO-SYNEPHRINE) Adult infusion Stopped (10/13/11 0731)  . DISCONTD: dexmedetomidine Stopped (10/13/11 0330)  . DISCONTD: DOPamine Stopped (10/12/11 1400)  . DISCONTD: insulin (NOVOLIN-R) infusion Stopped (10/13/11 0800)     Lab Results  Component Value Date   WBC 10.7* 10/13/2011   HGB 10.9* 10/13/2011   HCT 32.0* 10/13/2011   PLT 135* 10/13/2011   GLUCOSE 149* 10/13/2011   CHOL 141 08/22/2011   TRIG 72 08/22/2011   HDL 67 08/22/2011   LDLCALC 60 08/22/2011   ALT 24 10/10/2011   AST 24 10/10/2011   NA 142 10/13/2011   K 4.2 10/13/2011   CL 103 10/13/2011   CREATININE 1.20* 10/13/2011   BUN 23 10/13/2011   CO2 27 10/13/2011   INR 1.29 10/12/2011   HGBA1C 6.3* 10/10/2011  a paced Awake alert sitting in chair  Delight Ovens MD  Beeper [phone] Office [phone] 10/13/2011 7:28 PM

## 2011-10-13 NOTE — Progress Notes (Signed)
INITIAL ADULT NUTRITION ASSESSMENT Date: 10/13/2011   Time: 10:40 AM  INTERVENTION:  Ensure Complete twice daily between meals (350 kcals, 13 gm protein per 8 fl oz bottle) RD to follow for nutrition care plan  Reason for Assessment: Low Braden  ASSESSMENT: Female 71 y.o.  Dx: severe aortic insufficiency   Hx:  Past Medical History  Diagnosis Date  . Hypertension   . CHF (congestive heart failure)   . Aortic stenosis     Related Meds:     . acetaminophen (TYLENOL) oral liquid 160 mg/5 mL  650 mg Per Tube NOW   Or  . acetaminophen  650 mg Rectal NOW  . acetaminophen  1,000 mg Oral Q6H   Or  . acetaminophen (TYLENOL) oral liquid 160 mg/5 mL  975 mg Per Tube Q6H  . aspirin EC  325 mg Oral Daily   Or  . aspirin  324 mg Per Tube Daily  . bisacodyl  10 mg Oral Daily   Or  . bisacodyl  10 mg Rectal Daily  . calcium chloride  1 g Intravenous Once  . cefUROXime (ZINACEF)  IV  1.5 g Intravenous To OR  . cefUROXime (ZINACEF)  IV  1.5 g Intravenous Q12H  . dexmedetomidine  0.1-0.7 mcg/kg/hr Intravenous To OR  . docusate sodium  200 mg Oral Daily  . famotidine (PEPCID) IV  20 mg Intravenous Q12H  . furosemide  40 mg Intravenous BID  . furosemide  80 mg Intravenous Once  . insulin aspart  0-24 Units Subcutaneous Q2H  . insulin aspart  0-24 Units Subcutaneous Q4H  . magnesium sulfate  4 g Intravenous Once  . metoprolol tartrate  12.5 mg Oral BID   Or  . metoprolol tartrate  12.5 mg Per Tube BID  . pantoprazole  40 mg Oral Q1200  . phytonadione (VITAMIN K) IV  5 mg Intravenous Once  . potassium chloride  10 mEq Intravenous Q1 Hr x 3  . potassium chloride  10 mEq Intravenous Once  . potassium chloride  10 mEq Intravenous Q1 Hr x 3  . potassium chloride  10 mEq Intravenous Q1 Hr x 3  . potassium chloride      . sodium chloride  3 mL Intravenous Q12H  . vancomycin  1,250 mg Intravenous To OR  . vancomycin  1,000 mg Intravenous Once  . DISCONTD: cefUROXime (ZINACEF)  IV  750  mg Intravenous To OR  . DISCONTD: cefUROXime (ZINACEF)  IV  750 mg Intravenous To OR  . DISCONTD: chlorhexidine  30 mL Topical UD  . DISCONTD: dexmedetomidine  0.1-0.7 mcg/kg/hr Intravenous To OR  . DISCONTD: DOPamine  2-20 mcg/kg/min Intravenous To OR  . DISCONTD: DOPamine  2-20 mcg/kg/min Intravenous To OR  . DISCONTD: epinephrine  0.5-20 mcg/min Intravenous To OR  . DISCONTD: epinephrine  0.5-20 mcg/min Intravenous To OR  . DISCONTD: insulin aspart  0-24 Units Subcutaneous Q4H  . DISCONTD: insulin (NOVOLIN-R) infusion   Intravenous To OR  . DISCONTD: insulin (NOVOLIN-R) infusion   Intravenous To OR  . DISCONTD: insulin regular  0-10 Units Intravenous TID WC  . DISCONTD: magnesium sulfate  40 mEq Other To OR  . DISCONTD: magnesium sulfate  40 mEq Other To OR  . DISCONTD: metoprolol tartrate  12.5 mg Oral Once  . DISCONTD: nitroGLYCERIN  2-200 mcg/min Intravenous To OR  . DISCONTD: nitroGLYCERIN  2-200 mcg/min Intravenous To OR  . DISCONTD: nitroglycerin-nicardipine-HEPARIN-sodium bicarbonate irrigation for artery spasm   Irrigation To OR  . DISCONTD:  nitroglycerin-nicardipine-HEPARIN-sodium bicarbonate irrigation for artery spasm   Irrigation To OR  . DISCONTD: phenylephrine (NEO-SYNEPHRINE) Adult infusion  30-200 mcg/min Intravenous To OR  . DISCONTD: phenylephrine (NEO-SYNEPHRINE) Adult infusion  30-200 mcg/min Intravenous To OR  . DISCONTD: potassium chloride  80 mEq Other To OR  . DISCONTD: potassium chloride  80 mEq Other To OR  . DISCONTD: tranexamic acid  15 mg/kg Intravenous To OR  . DISCONTD: tranexamic acid  2 mg/kg Intracatheter To OR  . DISCONTD: tranexamic acid  2 mg/kg Intracatheter To OR  . DISCONTD: tranexamic acid (CYKLOKAPRON) infusion (OHS)  1.5 mg/kg/hr Intravenous To OR  . DISCONTD: tranexamic acid (CYKLOKAPRON) infusion (OHS)  1.5 mg/kg/hr Intravenous To OR     Ht: 5\' 4"  (162.6 cm)  Wt: 159 lb 2.8 oz (72.2 kg)  Ideal Wt: 54.5 kg % Ideal Wt: 132%  Usual  Wt: unable to obtain % Usual Wt: ---  Body mass index is 27.32 kg/(m^2).  Food/Nutrition Related Hx: admission nutrition screen incomplete  Labs:  CMP     Component Value Date/Time   NA 144 10/13/2011 0445   K 3.6 10/13/2011 0445   CL 107 10/13/2011 0445   CO2 27 10/13/2011 0445   GLUCOSE 139* 10/13/2011 0445   BUN 22 10/13/2011 0445   CREATININE 0.89 10/13/2011 0445   CALCIUM 8.7 10/13/2011 0445   PROT 7.0 10/10/2011 1507   ALBUMIN 3.8 10/10/2011 1507   AST 24 10/10/2011 1507   ALT 24 10/10/2011 1507   ALKPHOS 70 10/10/2011 1507   BILITOT 0.7 10/10/2011 1507   GFRNONAA 64* 10/13/2011 0445   GFRAA 74* 10/13/2011 0445     Intake/Output Summary (Last 24 hours) at 10/13/11 1041 Last data filed at 10/13/11 0800  Gross per 24 hour  Intake 12131.08 ml  Output   8895 ml  Net 3236.08 ml    Diet Order: Cardiac  Supplements/Tube Feeding: N/A  IVF:    sodium chloride Last Rate: 20 mL (10/12/11 1800)  sodium chloride Last Rate: 20 mL (10/12/11 1345)  sodium chloride   lactated ringers Last Rate: 20 mL (10/12/11 1345)  milrinone Last Rate: 0.2 mcg/kg/min (10/13/11 0800)  nitroGLYCERIN Last Rate: Stopped (10/13/11 0500)  nitroPRUSSide Last Rate: Stopped (10/13/11 0345)  phenylephrine (NEO-SYNEPHRINE) Adult infusion Last Rate: Stopped (10/13/11 0731)  DISCONTD: dexmedetomidine Last Rate: Stopped (10/13/11 0330)  DISCONTD: DOPamine Last Rate: Stopped (10/12/11 1400)  DISCONTD: insulin (NOVOLIN-R) infusion Last Rate: Stopped (10/13/11 0800)    Estimated Nutritional Needs:   Kcal: 1800-2000 Protein: 80-90 gm Fluid: 1.8-2.0 L  RD unable to obtain nutrition hx at this time; no family present at bedside; patient s/p post-op aortic valve replacement, mitral valve repair 8/22; extubated this AM; + sternal incision; patient with low braden score 8/22; would benefit from addition of nutrition supplements to promote healing & recovery -- RD to order.  NUTRITION DIAGNOSIS: -Increased nutrient  needs (NI-5.1).  Status: Ongoing  RELATED TO: post-op healing  AS EVIDENCE BY: estimated nutrition needs  MONITORING/EVALUATION(Goals): Goal: Oral intake with meals & supplements to meet >/= 90% of estimated nutrition needs Monitor: PO & supplemental intake, weight, labs, I/O's  EDUCATION NEEDS: -No education needs identified at this time  Dietitian #: [phone]  DOCUMENTATION CODES Per approved criteria  -Not Applicable    Alger Memos 10/13/2011, 10:40 AM

## 2011-10-13 NOTE — Procedures (Signed)
Extubation Procedure Note  Patient Details:   Name: [name] DOB: [birth year] MRN: [id number]   Airway Documentation:   Patient extubated to 4 lpm nasal cannula via SICU wean.  VC 600 ml, NIF -30, patient able to hold head off bed 10 seconds.  Patient able to breathe around deflated cuff and vocalize post procedure.  Tolerated well, no complications noted.   Evaluation  O2 sats: stable throughout Complications: No apparent complications Patient did tolerate procedure well. Bilateral Breath Sounds: Clear Suctioning: Airway Yes  Dorice Stiggers, [place] Gell 10/13/2011, 6:05 AM

## 2011-10-13 NOTE — Clinical Documentation Improvement (Signed)
Anemia Blood Loss Clarification  THIS DOCUMENT IS NOT A PERMANENT PART OF THE MEDICAL RECORD  RESPOND TO THE THIS QUERY, FOLLOW THE INSTRUCTIONS BELOW:  1. If needed, update documentation for the patient's encounter via the notes activity.  2. Access this query again and click edit on the In [employer].  3. After updating, or not, click F2 to complete all highlighted (required) fields concerning your review. Select "additional documentation in the medical record" OR "no additional documentation provided".  4. Click Sign note button.  5. The deficiency will fall out of your In Basket *Please let us know if you are not able to complete this workflow by phone or e-mail (listed below).        10/13/11  Dear Consepcion Hearing Marton Redwood  In an effort to better capture your patient's severity of illness, reflect appropriate length of stay and utilization of resources, a review of the patient medical record has revealed the following indicators.    Based on your clinical judgment, please clarify and document in a progress note and/or discharge summary the clinical condition associated with the following supporting information:  In responding to this query please exercise your independent judgment.  The fact that a query is asked, does not imply that any particular answer is desired or expected.   Possible Clinical Conditions?   " Expected Acute Blood Loss Anemia  " Acute Blood Loss Anemia  " Acute on chronic blood loss anemia  " Precipitous drop in Hematocrit  " Other Condition________________  " Cannot Clinically Determine    Supporting Information: Mediastinal bleeding post OHS noted per 8/22 progress notes.  Signs and Symptoms: EBL: per Anesthesia record(initial surgery, 8/22) EBL:  per Anesthesia record(post-OHS surgery, 8/22)  Diagnostics: H&H on 8/20:  12.5/38.8 H&H on 8/22:   5.7/16/4  Treatments: Transfusion: IV fluids / plasma expanders: 8/22: order  for Albumin human, 5%, x4 8/22: cryoprecipitate transfused per Anesthesia record 8/22: PRBC: transfused per Anesthesia record 8/22: cell saver per Anesthesia record.  Reviewed: Additional documentation provided per 8/24 progress notes.  Thank You,  Marciano Sequin,  Clinical Documentation Specialist:  Pager: [phone]  Health Information Management [place]

## 2011-10-13 NOTE — Care Management Note (Unsigned)
    Page 1 of 1   10/13/2011     3:10:58 PM   CARE MANAGEMENT NOTE 10/13/2011  Patient:  BAYLIE, DRAKES   Account Number:  [id number]  Date Initiated:  10/13/2011  Documentation initiated by:  Mirka Barbone  Subjective/Objective Assessment:   PT S/P AVR AND MVR ON 10/12/11.  PTA, PT INDEPENDENT, LIVES ALONE.     Action/Plan:   MET WITH PT TO DISCUSS DC PLANS.  PT STATES SHE BELIEVES SHE WILL NEED "REHAB" PRIOR TO RETURNING HOME THIS TIME. SHE IS AGREEABLE TO ST-SNF FOR REHAB.  WILL CONSULT CSW TO FACILITATE THIS.  WILL OBTAIN PT AND OT CONSULTS.   Anticipated DC Date:  10/18/2011   Anticipated DC Plan:  SKILLED NURSING FACILITY  In-house referral  Clinical Social Worker      DC Planning Services  CM consult      Choice offered to / List presented to:             Status of service:  In process, will continue to follow Medicare Important Message given?   (If response is "NO", the following Medicare IM given date fields will be blank) Date Medicare IM given:   Date Additional Medicare IM given:    Discharge Disposition:    Per UR Regulation:    If discussed at Long Length of Stay Meetings, dates discussed:    Comments:

## 2011-10-14 ENCOUNTER — Inpatient Hospital Stay (HOSPITAL_COMMUNITY): Payer: Medicare Other

## 2011-10-14 LAB — CBC
MCV: 88.1 fL (ref 78.0–100.0)
Platelets: 124 10*3/uL — ABNORMAL LOW (ref 150–400)
RBC: 3.69 MIL/uL — ABNORMAL LOW (ref 3.87–5.11)
WBC: 10 10*3/uL (ref 4.0–10.5)

## 2011-10-14 LAB — GLUCOSE, CAPILLARY
Glucose-Capillary: 110 mg/dL — ABNORMAL HIGH (ref 70–99)
Glucose-Capillary: 111 mg/dL — ABNORMAL HIGH (ref 70–99)
Glucose-Capillary: 122 mg/dL — ABNORMAL HIGH (ref 70–99)
Glucose-Capillary: 128 mg/dL — ABNORMAL HIGH (ref 70–99)

## 2011-10-14 LAB — BASIC METABOLIC PANEL
CO2: 30 mEq/L (ref 19–32)
Calcium: 9 mg/dL (ref 8.4–10.5)
Sodium: 142 mEq/L (ref 135–145)

## 2011-10-14 MED ORDER — POTASSIUM CHLORIDE 10 MEQ/50ML IV SOLN
INTRAVENOUS | Status: AC
  Administered 2011-10-14: 10 meq via INTRAVENOUS
  Filled 2011-10-14: qty 150

## 2011-10-14 MED ORDER — POTASSIUM CHLORIDE 10 MEQ/50ML IV SOLN
10.0000 meq | INTRAVENOUS | Status: AC | PRN
  Administered 2011-10-14 – 2011-10-15 (×4): 10 meq via INTRAVENOUS
  Filled 2011-10-14: qty 50

## 2011-10-14 NOTE — Progress Notes (Signed)
Patient ID: [name], female   DOB: 1940/12/30, 71 y.o.   MRN: [id number] TCTS DAILY PROGRESS NOTE                   [address]            [name] [address]          [phone]      2 Days Post-Op Procedure(s) (LRB): EXPLORATION POST OPERATIVE OPEN HEART (N/A) Tissue Aortic valve replacement/mitral ring   Total Length of Stay:  LOS: 2 days   Subjective: Alert up in chair, neuro intact  Objective: Vital signs in last 24 hours: Temp:  [96.8 F (36 C)-98.4 F (36.9 C)] 98.3 F (36.8 C) (08/24 0800) Pulse Rate:  [70-82] 70  (08/24 0700) Cardiac Rhythm:  [-] Atrial paced (08/24 0400) Resp:  [0-24] 24  (08/24 0700) BP: (88-149)/(54-92) 132/76 mmHg (08/24 0700) SpO2:  [92 %-100 %] 94 % (08/24 0700) Arterial Line BP: (99-132)/(55-79) 132/79 mmHg (08/23 1017) Weight:  [159 lb 9.8 oz (72.4 kg)] 159 lb 9.8 oz (72.4 kg) (08/24 0700)  Filed Weights   10/12/11 1631 10/13/11 0630 10/14/11 0700  Weight: 156 lb (70.761 kg) 159 lb 2.8 oz (72.2 kg) 159 lb 9.8 oz (72.4 kg)    Weight change: 3 lb 9.8 oz (1.639 kg)   Hemodynamic parameters for last 24 hours: PAP: (37-63)/(15-28) 63/28 mmHg  Intake/Output from previous day: 08/23 0701 - 08/24 0700 In: 1412.2 [P.O.:820; I.V.:484.2; IV Piggyback:108] Out: 1930 [Urine:1560; Chest Tube:370]  Intake/Output this shift: Total I/O In: 154 [IV Piggyback:154] Out: 90 [Urine:60; Chest Tube:30]  Current Meds: Scheduled Meds:   . acetaminophen  1,000 mg Oral Q6H   Or  . acetaminophen (TYLENOL) oral liquid 160 mg/5 mL  975 mg Per Tube Q6H  . aspirin EC  325 mg Oral Daily   Or  . aspirin  324 mg Per Tube Daily  . bisacodyl  10 mg Oral Daily   Or  . bisacodyl  10 mg Rectal Daily  . cefUROXime (ZINACEF)  IV  1.5 g Intravenous Q12H  . docusate sodium  200 mg Oral Daily  . feeding supplement  237 mL Oral BID BM  . furosemide  40 mg Intravenous BID  . insulin aspart  0-24 Units Subcutaneous Q4H  . metoprolol  tartrate  12.5 mg Oral BID   Or  . metoprolol tartrate  12.5 mg Per Tube BID  . pantoprazole  40 mg Oral Q1200  . potassium chloride  10 mEq Intravenous Q1 Hr x 3  . potassium chloride      . sodium chloride  3 mL Intravenous Q12H   Continuous Infusions:   . sodium chloride 20 mL (10/12/11 1800)  . sodium chloride 20 mL (10/12/11 1345)  . sodium chloride    . lactated ringers 20 mL (10/12/11 1345)  . milrinone Stopped (10/13/11 0900)  . nitroGLYCERIN Stopped (10/13/11 0500)  . nitroPRUSSide Stopped (10/13/11 0345)  . phenylephrine (NEO-SYNEPHRINE) Adult infusion Stopped (10/13/11 0731)   PRN Meds:.albumin human, metoprolol, morphine injection, ondansetron (ZOFRAN) IV, oxyCODONE, potassium chloride, sodium chloride  General appearance: alert and cooperative Neurologic: intact Heart: regular rate and rhythm, S1, S2 normal, no murmur, click, rub or gallop Lungs: clear to auscultation bilaterally and normal percussion bilaterally Abdomen: soft, non-tender; bowel sounds normal; no masses,  no organomegaly Extremities: extremities normal, atraumatic, no cyanosis or edema and Homans sign is negative, no sign of DVT Wound: sternum stable  Lab  Results: CBC: Basename 10/14/11 0420 10/13/11 1714 10/13/11 1708  WBC 10.0 -- 10.7*  HGB 11.0* 10.9* --  HCT 32.5* 32.0* --  PLT 124* -- 135*   BMET:  Basename 10/14/11 0420 10/13/11 1714 10/13/11 0445  NA 142 142 --  K 3.6 4.2 --  CL 105 103 --  CO2 30 -- 27  GLUCOSE 116* 149* --  BUN 23 23 --  CREATININE 1.06 1.20* --  CALCIUM 9.0 -- 8.7    PT/INR:  Basename 10/12/11 2337  LABPROT 16.3*  INR 1.29   Radiology: Dg Chest Portable 1 View In Am  10/14/2011  *RADIOLOGY REPORT*  Clinical Data: Cardiac valve surgery.  PORTABLE CHEST - 1 VIEW  Comparison: 10/13/2011  Findings: Bilateral chest drains are present.  The mediastinal drain has been removed.  Swan-Ganz catheter has been removed. Heart size remains enlarged.  Increased densities  at the left lung base probably represent a combination of atelectasis and some pleural fluid.  Negative for pneumothorax.  Right jugular central line is still present. Few densities at the right costophrenic angle.  IMPRESSION: Increased densities at the left lung base most likely represent consolidation or atelectasis.  Left pleural fluid cannot be excluded despite the left chest tube.  Cardiomegaly.   Original Report Authenticated By: Richarda Overlie, M.D.    Dg Chest Portable 1 View In Am  10/13/2011  *RADIOLOGY REPORT*  Clinical Data: Mediastinal bleeding.  PORTABLE CHEST - 1 VIEW mediastinal bleeding.  Comparison: 10/12/2011  Findings: Interval extubation and removal of NG tube.  Swan-Ganz catheter and bilateral chest tubes remain in place.  No pneumothorax.  Cardiomegaly.  Left base atelectasis.  Decreasing vascular congestion.  IMPRESSION: Interval extubation with decreasing vascular congestion.  No pneumothorax.   Original Report Authenticated By: Cyndie Chime, M.D.    Dg Chest Port 1 View  10/12/2011  *RADIOLOGY REPORT*  Clinical Data: Postop.  Bleeding.  PORTABLE CHEST - 1 VIEW  Comparison: 10/12/2011 at 1410 hours  Findings: Postoperative changes in the mediastinum with sternotomy wires, surgical clips, and cardiac valve prostheses.  Endotracheal tube with tip about 3.7 cm above the carina.  Enteric tube tip is below the field of view in the left upper quadrant, consistent with location at least in the stomach.  Bilateral chest tubes. Mediastinal drains x2.  Right Swan-Ganz catheter with tip over the pulmonary outflow tract.  No visualized pneumothorax.  Cardiac enlargement.  Pulmonary vascularity appears normal.  Possible mild atelectasis in the lung bases.  No significant change since previous study.  IMPRESSION: Appliances are in satisfactory apparent location.  Cardiac enlargement.  Minimal atelectasis in the lung bases.  No visible pneumothorax.   Original Report Authenticated By: Marlon Pel, M.D.    Dg Chest Portable 1 View  10/12/2011  *RADIOLOGY REPORT*  Clinical Data: Postop  PORTABLE CHEST - 1 VIEW  Comparison: 10/10/2011  Findings: Postoperative changes are now noted.  The heart remains moderately enlarged with a globular appearance.  Endotracheal tube placed with its tip 2.4 cm from the carina.  NG tube is in the fundus of the stomach.  Multiple chest and mediastinal tubes are in place.  No left pneumothorax.  Tiny right apical pneumothorax suspected.  Swan-Ganz catheter with its tip at the main pulmonary artery terminus.  Small bilateral pleural effusions are not significantly changed.  Mild basilar atelectasis.  No pulmonary edema.  IMPRESSION: Postoperative chest.  Support devices in appropriate position.  Tiny right pneumothorax.  No pulmonary edema.  Small  pleural effusions and basilar atelectasis.   Original Report Authenticated By: Donavan Burnet, M.D.      Assessment/Plan: S/P Procedure(s) (LRB): EXPLORATION POST OPERATIVE OPEN HEART (N/A) Mobilize Diuresis d/c tubes/lines Expected Acute  Blood - loss Anemia Hypokalemia being replaced Sinus rhythm   Delight Ovens MD  Beeper [phone] Office [phone] 10/14/2011 8:54 AM

## 2011-10-14 NOTE — Evaluation (Signed)
Physical Therapy Evaluation Patient Details Name: [name] MRN: [id number] DOB: Jul 10, 1940 Today's Date: 10/14/2011 Time: 4098-1191 PT Time Calculation (min): 24 min  PT Assessment / Plan / Recommendation Clinical Impression  pt presents with recent AVR and now exploration of sternal incision.  pt very painful with all mobility, RN made aware.  pt will need ST-SNF at D/C.      PT Assessment  Patient needs continued PT services    Follow Up Recommendations  Skilled nursing facility    Barriers to Discharge None      Equipment Recommendations  Defer to next venue    Recommendations for Other Services OT consult   Frequency Min 3X/week    Precautions / Restrictions Precautions Precautions: Sternal;Fall Restrictions Weight Bearing Restrictions: No   Pertinent Vitals/Pain Pt indicates very painful R flank.  RN made aware.        Mobility  Bed Mobility Bed Mobility: Not assessed Transfers Transfers: Sit to Stand;Stand to Sit Sit to Stand: 1: +2 Total assist;With upper extremity assist;From chair/3-in-1 Sit to Stand: Patient Percentage: 60% Stand to Sit: 1: +2 Total assist;With upper extremity assist;To chair/3-in-1 Stand to Sit: Patient Percentage: 70% Details for Transfer Assistance: Cues for hands to knees and safe technique.  pt very painful and moves very slowly.   Ambulation/Gait Ambulation/Gait Assistance: 1: +2 Total assist Ambulation/Gait: Patient Percentage: 70% Ambulation Distance (Feet): 1 Feet Assistive device:  (W/C to hold equip) Ambulation/Gait Assistance Details: pt indicates R flank very painful limiting mobility.  pt only able to take 3 very small steps then needed recliner scooted up behind her.  cues for upright posture and encouragement.   Gait Pattern: Step-through pattern;Decreased stride length;Trunk flexed Stairs: No Wheelchair Mobility Wheelchair Mobility: No    Exercises     PT Diagnosis: Difficulty walking;Acute pain  PT Problem  List: Decreased strength;Decreased activity tolerance;Decreased balance;Decreased mobility;Decreased knowledge of use of DME;Decreased knowledge of precautions;Pain PT Treatment Interventions: DME instruction;Gait training;Stair training;Functional mobility training;Therapeutic activities;Balance training;Therapeutic exercise;Patient/family education   PT Goals Acute Rehab PT Goals PT Goal Formulation: With patient Time For Goal Achievement: 10/28/11 Potential to Achieve Goals: Good Pt will Roll Supine to Right Side: with modified independence PT Goal: Rolling Supine to Right Side - Progress: Goal set today Pt will Roll Supine to Left Side: with modified independence PT Goal: Rolling Supine to Left Side - Progress: Goal set today Pt will go Supine/Side to Sit: with modified independence PT Goal: Supine/Side to Sit - Progress: Goal set today Pt will go Sit to Supine/Side: with modified independence PT Goal: Sit to Supine/Side - Progress: Goal set today Pt will go Sit to Stand: with supervision PT Goal: Sit to Stand - Progress: Goal set today Pt will go Stand to Sit: with supervision PT Goal: Stand to Sit - Progress: Goal set today Pt will Ambulate: >150 feet;with supervision;with rolling walker PT Goal: Ambulate - Progress: Goal set today  Visit Information  Last PT Received On: 10/14/11 Assistance Needed: +2    Subjective Data  Subjective: It just hurts so bad.   Patient Stated Goal: Back to normal   Prior Functioning  Home Living Lives With: Alone Available Help at Discharge: Skilled Nursing Facility Home Adaptive Equipment: Walker - rolling Additional Comments: pt lived alone and was Independent prior to AVR, now plans on D/C to ST-SNF Prior Function Level of Independence: Independent Able to Take Stairs?: Yes Driving: Yes Vocation: Retired Musician: No difficulties    Cognition  Overall Cognitive Status: Appears  within functional limits for tasks  assessed/performed Arousal/Alertness: Awake/alert Orientation Level: Oriented X4 / Intact Behavior During Session: [facility] for tasks performed    Extremity/Trunk Assessment Right Lower Extremity Assessment RLE ROM/Strength/Tone: Deficits RLE ROM/Strength/Tone Deficits: Generally weak, 4/5 RLE Sensation: WFL - Light Touch Left Lower Extremity Assessment LLE ROM/Strength/Tone: Deficits LLE ROM/Strength/Tone Deficits: Generally weak, 4/5 LLE Sensation: WFL - Light Touch Trunk Assessment Trunk Assessment: Kyphotic   Balance Balance Balance Assessed: No  End of Session PT - End of Session Equipment Utilized During Treatment: Gait belt Activity Tolerance: Patient limited by pain Patient left: in chair;with call bell/phone within reach;with nursing in room Nurse Communication: Mobility status;Patient requests pain meds  GP     Sunny Schlein, [place] [phone] 10/14/2011, 8:58 AM

## 2011-10-14 NOTE — Progress Notes (Signed)
Patient ID: [name], female   DOB: 1940-11-06, 71 y.o.   MRN: [id number]                   [address]            [place] [address]          [phone]     2 Days Post-Op Procedure(s) (LRB): EXPLORATION POST OPERATIVE OPEN HEART (N/A)  Total Length of Stay:  LOS: 2 days  BP 147/83  Pulse 69  Temp 98.1 F (36.7 C) (Oral)  Resp 19  Ht 5\' 4"  (1.626 m)  Wt 159 lb 9.8 oz (72.4 kg)  BMI 27.40 kg/m2  SpO2 97%     . sodium chloride 20 mL (10/12/11 1800)  . sodium chloride 20 mL (10/12/11 1345)  . sodium chloride    . lactated ringers 20 mL (10/12/11 1345)  . milrinone Stopped (10/13/11 0900)  . nitroGLYCERIN Stopped (10/13/11 0500)  . nitroPRUSSide Stopped (10/13/11 0345)  . phenylephrine (NEO-SYNEPHRINE) Adult infusion Stopped (10/13/11 0731)     Lab Results  Component Value Date   WBC 10.0 10/14/2011   HGB 11.0* 10/14/2011   HCT 32.5* 10/14/2011   PLT 124* 10/14/2011   GLUCOSE 116* 10/14/2011   CHOL 141 08/22/2011   TRIG 72 08/22/2011   HDL 67 08/22/2011   LDLCALC 60 08/22/2011   ALT 24 10/10/2011   AST 24 10/10/2011   NA 142 10/14/2011   K 3.6 10/14/2011   CL 105 10/14/2011   CREATININE 1.06 10/14/2011   BUN 23 10/14/2011   CO2 30 10/14/2011   INR 1.29 10/12/2011   HGBA1C 6.3* 10/10/2011   Says food is bad, sitting in chair. Walked today 300 ft  Delight Ovens MD  Beeper [phone] Office [phone] 10/14/2011 7:13 PM

## 2011-10-14 NOTE — Op Note (Signed)
[name], [name] NO.:  [id number]  MEDICAL RECORD NO.:  [id number]  LOCATION:  2308                         FACILITY:  [facility]  PHYSICIAN:  Evelene Croon, M.D.     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  PROCEDURE:  Exploration of mediastinum with removal of clotted blood.  SURGEON:  Evelene Croon, M.D.  ANESTHESIA:  General endotracheal.  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  Follow earlier in the day.  She had obvious postoperative coagulopathy and thrombocytopenia with platelet count 90,000 was given 1 unit of platelets.  We thought that we had obtained reasonable hemostasis at the end of surgery and she was transferred to Surgical Intensive Care Unit.  After arrival there, we checked her clotting studies and she did have low fibrinogen as well as an elevated INR and was given cryoprecipitate and FFP.  She continued to have significant chest tube output for several hours and this was 300 mL an hour for several hours.  Her chest tube output suddenly decreased to about 50 mL/h, although she was noted to be markedly anemic with a hemoglobin of 5.6.  She still had some bright red bloody drainage coming from her right pleural tube and I was concerned that she probably still had ongoing bleeding and may have clotted off with tubes.  I felt the best option be to take the patient back to the operating room to re-explore her and remove any clotted blood and be sure there is nothing  continuing to bleed.  We had correcting her coagulopathy at that point.  She has warmed up to normal body temperature.  I discussed the procedure with her sister by telephone.  OPERATIVE PROCEDURE:  The patient was taken back to the operating room, placed on table in supine position.  She remained hemodynamically stable.  After further anesthesia was given, the chest was prepped with Betadine soap and solution and draped in usual sterile manner.  The chest tubes were left in place.  The sternotomy incision was opened. There was old blood noted within the subcutaneous plane.  The sternal wires were removed and the sternum separated.  There was a large amount of clotted and fresh blood noted within the mediastinum as well as within the right pleural space, which had been partially opened.  The chest tubes were pulled from the location and declotted.  All the blood clot was removed.  After inspection of all the cannulation sites and suture lines, as well as right and left pleural space, I did not find any specific bleeding sites.  She appeared to be oozing from the edge of the sternum and the bone and this was controlled with Gelfoam and thrombin as well as electrocautery.  After all the clots were removed and everything was inspected, I did  not feel there was any significant ongoing bleeding.  We did check her coagulation profile again and it was fairly well corrected, although her platelet count dropped down again less than 100,000.  She was given 1 more unit of platelets.  Then, the chest tubes were returned to their normal position and the sternum was reapproximated with #6 stainless steel wires.  Fascia was closed with continuous #1 Vicryl suture.  Subcutaneous tissue was closed with continuous 2-0 Vicryl and skin with a 3-0 Vicryl subcuticular closure. The sponge, needle, and instrument counts were correct according to scrub nurse.  Dry sterile dressing applied over the incision and  around the chest tubes, which were Pleur-Evac suction.  The patient remained hemodynamically stable and transported transferred back to the Surgical Intensive Care Unit in guarded but stable condition.     Evelene Croon, M.D.     BB/MEDQ  D:  10/13/2011  T:  10/14/2011  Job:  [id number]

## 2011-10-14 NOTE — Op Note (Signed)
[name], HARTFIELD NO.:  [id number]  MEDICAL RECORD NO.:  [id number]  LOCATION:  2308                         FACILITY:  [facility]  PHYSICIAN:  Evelene Croon, M.D.     DATE OF BIRTH:  [birth year]  DATE OF PROCEDURE:  10/12/2011 DATE OF DISCHARGE:                              OPERATIVE REPORT   PREOPERATIVE DIAGNOSIS:  Severe aortic insufficiency and moderate mitral insufficiency.  POSTOPERATIVE DIAGNOSIS:  Severe aortic insufficiency and moderate mitral insufficiency.  OPERATIVE PROCEDURE:  Median sternotomy, extracorporeal circulation, mitral annuloplasty with a 28-mm Sorin 3D Memo annuloplasty ring, aortic valve replacement using a 21-mm Edwards pericardial Magna-Ease valve.  SURGEON:  Evelene Croon, M.D.  ASSISTANT:  Orlie Dakin, RNFA.  ANESTHESIA:  General endotracheal.  CLINICAL HISTORY:  This patient is a 71 year old African American female with a history of hypertension, had been very active and feeling fine until April 2013, when she began developing progressive worsening dyspnea and generalized anasarca, especially in her legs.  She initially thought she had arthritis to the stiffness in her legs.  She had an echocardiogram done on August 17, 2011, that showed moderate aortic insufficiency with right ventricular dysfunction.  She was started on diuretics due to worsening symptoms and was admitted to the hospital for acute on chronic diastolic heart failure.  She was treated with intravenous diuretics with slow improvement.  She was noted to have a moderate-to-large left pleural effusion, underwent an ultrasound-guided thoracentesis today which showed a transudative effusion.  She also had ascites and underwent paracentesis.  Cultures were negative.  She was discharged on diuretics and said that she continued to improve and was no longer short of breath with a weight going from 192 pounds down to about 145 pounds.  2D echocardiogram on September 04, 2011,  showed left ventricular cavity size to be normal.  There is moderate concentric left ventricular hypertrophy.  There is abnormal septal wall motion due to right ventricular volume overload.  Left ventricular ejection fraction was estimated at 40% with global hypokinesis.  There is grade 2 diastolic dysfunction.  The atrial cavity was severely dilated on both sides.  Right ventricular cavity was moderately enlarged with severe global hypokinesis.  There is moderate-to-severe aortic insufficiency, it appeared worse on her prior echocardiogram.  There is mild-to- moderate mitral regurgitation with a structurally normal mitral valve. There is mild-to-moderate tricuspid regurgitation with a structurally normal tricuspid valve.  There is evidence of mild pulmonary hypertension.  There is a large left pleural effusion.  She subsequently underwent right and left heart catheterizations on September 11, 2011, this showed normal coronary arteries.  There was severe aortic insufficiency with normal sized ascending aorta.  Left ventricular ejection fraction was 55-60%.  PA pressure was 64/30 with a mean of 43.  Pulmonary artery saturation was 48%.  Wedge pressure was 32/34 with a mean of 30.  Her aortic saturation was 91%.  Right ventricular pressure was 63/13 with a right ventricular end-diastolic pressure of 23.  Right atrial pressure was 23/21 with a mean of 17 and a right atrial saturation of 55%. Cardiac index was 2.07 by Fick and 1.4 by thermodilution.  After review of the studies and examination  of the patient, I felt that aortic valve replacement and intraoperative evaluation of the mitral and tricuspid valve by TEE was indicated to prevent worsening heart failure.  I discussed the operative procedure of aortic valve replacement and possible mitral and tricuspid valve repair with her and her family including alternatives, benefits, and risks including, but not limited to, bleeding, blood  transfusion, infection, stroke, myocardial infarction, heart block, requiring permanent pacemaker, organ dysfunction, and death.  She understood all this and agreed to proceed. I discussed the pros and cons of mechanical and tissue valves and recommended a tissue valve given her age of 44.  She understood this and agreed to proceed.  OPERATIVE PROCEDURE:  The patient was taken to the operating room, placed on the table in supine position.  With her awake, her pulmonary artery pressure was markedly elevated at about 80/40 and essentially 2/3rd of systemic pressure.  Her CVP was about 30.  After induction of general endotracheal anesthesia, her pulmonary artery pressures fell somewhat to about 50/28.  CVP dropped down to about 25.  Then, a Foley catheter was placed in the bladder using sterile technique.  Then, the chest, abdomen, and both lower extremities were prepped and draped in usual sterile manner.  TEE was performed by Anesthesiology.  This was reviewed by me.  This showed severe aortic insufficiency.  There was 2 to 3+ mitral regurgitation with a structurally normal appearing mitral valve, but dilated left ventricle that was contracting fairly well, and it appeared that the mitral anulus may have been stretched open.  There was trivial tricuspid regurgitation, with some dilatation of the right ventricle, but fairly good contractility.  After reviewing the echocardiogram, I felt that aortic valve replacement and mitral annuloplasty was indicated.  With her severe pulmonary hypertension that persisted even with her sleep and 2-3+ mitral regurgitation with left ventricular dilatation, I did not feel that her mitral regurgitation would go away without mitral annuloplasty and that would adversely affect the right ventricular function and right heart failure symptoms.  Then, the chest was entered through a median sternotomy incision.  The pericardium opened in the midline.   Examination of the heart showed dilatation of the right ventricle as well as the right atrium which was tense.  The ascending aorta was of normal size and had no palpable plaques in it.  Then, the patient was heparinized when adequate ACT was obtained.  The distal ascending aorta was cannulated using a 20-French aortic cannula for arterial inflow.  Venous outflow was achieved using bicaval venous cannulation with a 24-French right-angle metal-tipped cannula placed through a pursestring suture in the superior vena cava and a 36-French plastic right-angle cannula placed through a pursestring suture in the low right atrium.  An antegrade cardioplegia and vent cannula was inserted in the aortic root.  Retrograde cardioplegic cannula was inserted through a pursestring suture in the right atrium and advanced in the coronary sinus without difficulty.  The patient was placed on cardiopulmonary bypass.  The superior and inferior vena cavae were encircled with tapes.  Then, the aorta was crossclamped and 800 mL of cold blood retrograde cardioplegia was given with good cooling of the ventricular septum, but continued electrical activity that appeared to be coming from the right ventricle. Therefore, I opened the ascending aorta about 1 cm above the sino- tubular junction, and gave cold blood antegrade cardioplegia directly into the right and left coronary ostium using a hand-held Spencer cannula.  This resulted in complete cessation of electrical activity  and good myocardial cooling.  Systemic hypothermia to 28 degrees centigrade and topical hypothermic iced saline was used.  Temperature probe was placed in the septum and insulating pad in the pericardium.  I gave cold blood retrograde cardioplegia throughout the case at about 20-minute intervals as well as intermittent cold blood antegrade cardioplegia directly into the coronary ostia throughout the aortic valve procedure to maintain good  myocardial cooling and complete sensation of electrical activity.  Then, the left atrium was opened through a vertical incision in the interatrial groove.  The mitral retractor was placed.  There was excellent exposure of the mitral valve.  Examination of the valve showed that it had normal structure.  There appeared to be some annular dilatation.  I felt this will be straight forwarded to correct with annuloplasty ring.  Then, a series of 2-0 Ethibond sutures were placed around the mitral annulus.  Then, the distance between the fibrous trigones and the surface area of the mitral anterior leaflet were measured and a 28-mm Sorin Memo 3D ring was chosen.  This had catalog #[id number], serial [id number].  Then, the sutures were placed through the ring and the ring lowered in place.  The sutures were tied sequentially.  The valve was tested with iced saline solution and was completely competent with left ventricle completely filled and lock down of the valve.  Then, the left atriotomy incision was closed in 2 layers using continuous 3-0 Prolene suture.  Then, attention was turned to aortic valve replacement.  The aortotomy was extended.  Examination of the aortic valve showed that there were 3 leaflets.  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 I did not feel that this would be suitable for repair.  The right and left coronary ostia were identified.  The native valve leaflets were excised.  There was no calcium in the anulus.  The anulus was sized and a 21-mm Edwards Interior and spatial designer valve was chosen.  This had model #3300TFX and serial [id number].  Then, a series of pledgeted 2-0 Ethibond horizontal mattress sutures were placed around the aortic anulus with the pledgets in a subannular position.  The sutures were placed through the  sewing ring and the valve lowered into place.  The sutures were tied sequentially.  The valve seated nicely.  The coronary ostia were not obstructed.  The patient was then rewarmed to 37 degrees centigrade. The aortotomy was closed in 2 layers using continuous 4-0 Prolene suture with felt strips to reinforce the aorta.  Then, the left side of the heart was de-aired.  We did use CO2 insufflation in the pericardium throughout the procedure to minimize intracardiac air.  After de-airing maneuvers, the head was placed in Trendelenburg position, the crossclamp removed with time of 143 minutes.  The patient was defibrillated into sinus rhythm.  The aortotomy and left atrial suture line appeared hemostatic.  Two temporary right ventricular and right atrial pacing wires were placed and brought through the skin.  When the patient rewarmed to 37 degrees centigrade, she was weaned from cardiopulmonary bypass on dopamine at 3 mcg/kg/minute and milrinone at 0.3 mcg/kg/minute.  Cardiac function appeared good with a cardiac output of 4 L/minute.  TEE showed a normal functioning aortic valve prosthesis with no evidence of perivalvular leak or regurgitation.  There was no mitral regurgitation.  There was no tricuspid regurgitation.  Left and right heart function appeared improved.  Pulmonary artery pressure and CVP had dropped to normal.  Then, protamine was given and the venous and aortic cannulas were removed without difficulty.  The patient did have coagulopathy with a platelet count of 90,000, was given a unit of platelets.  When I felt there was adequate hemostasis, four chest tubes were placed with bilateral pleural tubes, two in the post pericardium, one in the anterior mediastinum.  Sternum was then closed with #6 stainless steel wires.  The fascia was closed with continuous #1 Vicryl suture.  Subcutaneous tissue was closed with continuous 2-0 Vicryl and the skin with a 3-0 Vicryl subcuticular  closure.  The sponge, needle, and instrument counts were correct according to the scrub nurse.  Dry sterile dressing was applied over the incisions around the chest tubes which were Pleur-Evac suction.  The patient remained hemodynamically stable and transferred to the SICU in guarded, but stable condition.     Evelene Croon, M.D.     BB/MEDQ  D:  10/13/2011  T:  10/14/2011  Job:  [id number]  cc:   Pamella Pert, MD

## 2011-10-15 ENCOUNTER — Inpatient Hospital Stay (HOSPITAL_COMMUNITY): Payer: Medicare Other

## 2011-10-15 LAB — COMPREHENSIVE METABOLIC PANEL
ALT: 16 U/L (ref 0–35)
AST: 21 U/L (ref 0–37)
Albumin: 2.2 g/dL — ABNORMAL LOW (ref 3.5–5.2)
Alkaline Phosphatase: 58 U/L (ref 39–117)
BUN: 22 mg/dL (ref 6–23)
CO2: 32 mEq/L (ref 19–32)
Calcium: 8.7 mg/dL (ref 8.4–10.5)
Chloride: 106 mEq/L (ref 96–112)
Creatinine, Ser: 0.84 mg/dL (ref 0.50–1.10)
GFR calc Af Amer: 79 mL/min — ABNORMAL LOW (ref >90)
GFR calc non Af Amer: 68 mL/min — ABNORMAL LOW (ref >90)
Glucose, Bld: 102 mg/dL — ABNORMAL HIGH (ref 70–99)
Potassium: 3.4 mEq/L — ABNORMAL LOW (ref 3.5–5.1)
Sodium: 143 mEq/L (ref 135–145)
Total Bilirubin: 0.7 mg/dL (ref 0.3–1.2)
Total Protein: 5.2 g/dL — ABNORMAL LOW (ref 6.0–8.3)

## 2011-10-15 LAB — CBC
HCT: 30.2 % — ABNORMAL LOW (ref 36.0–46.0)
Hemoglobin: 10.1 g/dL — ABNORMAL LOW (ref 12.0–15.0)
MCH: 29.7 pg (ref 26.0–34.0)
MCHC: 33.4 g/dL (ref 30.0–36.0)
MCV: 88.8 fL (ref 78.0–100.0)
Platelets: 121 10*3/uL — ABNORMAL LOW (ref 150–400)
RBC: 3.4 MIL/uL — ABNORMAL LOW (ref 3.87–5.11)
RDW: 15.9 % — ABNORMAL HIGH (ref 11.5–15.5)
WBC: 10.2 10*3/uL (ref 4.0–10.5)

## 2011-10-15 LAB — GLUCOSE, CAPILLARY
Glucose-Capillary: 98 mg/dL (ref 70–99)
Glucose-Capillary: 99 mg/dL (ref 70–99)
Glucose-Capillary: 160 mg/dL — ABNORMAL HIGH (ref 70–99)

## 2011-10-15 MED ORDER — SODIUM CHLORIDE 0.9 % IV SOLN: 250.0000 mL | INTRAVENOUS | Status: DC | PRN

## 2011-10-15 MED ORDER — BISACODYL 5 MG PO TBEC: 10.0000 mg | DELAYED_RELEASE_TABLET | Freq: Every day | ORAL | Status: DC | PRN

## 2011-10-15 MED ORDER — MOVING RIGHT ALONG BOOK
Freq: Once | Status: AC
  Administered 2011-10-15: 12:00:00
  Filled 2011-10-15: qty 1

## 2011-10-15 MED ORDER — SPIRONOLACTONE 25 MG PO TABS
25.0000 mg | ORAL_TABLET | Freq: Every day | ORAL | Status: DC
  Administered 2011-10-15 – 2011-10-20 (×6): 25 mg via ORAL
  Filled 2011-10-15 (×6): qty 1

## 2011-10-15 MED ORDER — ALUM & MAG HYDROXIDE-SIMETH 200-200-20 MG/5ML PO SUSP: 15.0000 mL | ORAL | Status: DC | PRN

## 2011-10-15 MED ORDER — ONDANSETRON HCL 4 MG PO TABS: 4.0000 mg | ORAL_TABLET | Freq: Four times a day (QID) | ORAL | Status: DC | PRN

## 2011-10-15 MED ORDER — MOVING RIGHT ALONG BOOK
Freq: Once | Status: DC
  Filled 2011-10-15: qty 1

## 2011-10-15 MED ORDER — POTASSIUM CHLORIDE CRYS ER 20 MEQ PO TBCR
20.0000 meq | EXTENDED_RELEASE_TABLET | Freq: Every day | ORAL | Status: DC
  Administered 2011-10-15 – 2011-10-16 (×2): 20 meq via ORAL
  Filled 2011-10-15 (×2): qty 1

## 2011-10-15 MED ORDER — TRAMADOL HCL 50 MG PO TABS
50.0000 mg | ORAL_TABLET | ORAL | Status: DC | PRN
  Administered 2011-10-15 – 2011-10-19 (×3): 100 mg via ORAL
  Administered 2011-10-19: 50 mg via ORAL
  Administered 2011-10-20: 100 mg via ORAL
  Filled 2011-10-15 (×4): qty 2
  Filled 2011-10-15: qty 1

## 2011-10-15 MED ORDER — METOPROLOL TARTRATE 12.5 MG HALF TABLET
12.5000 mg | ORAL_TABLET | Freq: Once | ORAL | Status: AC
  Administered 2011-10-15: 12.5 mg via ORAL
  Filled 2011-10-15: qty 1

## 2011-10-15 MED ORDER — ONDANSETRON HCL 4 MG/2ML IJ SOLN: 4.0000 mg | Freq: Four times a day (QID) | INTRAMUSCULAR | Status: DC | PRN

## 2011-10-15 MED ORDER — LOSARTAN POTASSIUM 25 MG PO TABS
25.0000 mg | ORAL_TABLET | Freq: Every day | ORAL | Status: DC
  Administered 2011-10-15: 25 mg via ORAL
  Filled 2011-10-15 (×2): qty 1

## 2011-10-15 MED ORDER — INSULIN ASPART 100 UNIT/ML ~~LOC~~ SOLN
0.0000 [IU] | Freq: Three times a day (TID) | SUBCUTANEOUS | Status: DC
  Administered 2011-10-15 – 2011-10-17 (×3): 2 [IU] via SUBCUTANEOUS

## 2011-10-15 MED ORDER — SODIUM CHLORIDE 0.9 % IJ SOLN: 3.0000 mL | Freq: Two times a day (BID) | INTRAMUSCULAR | Status: DC

## 2011-10-15 MED ORDER — FUROSEMIDE 40 MG PO TABS: 40.0000 mg | ORAL_TABLET | Freq: Every day | ORAL | Status: DC

## 2011-10-15 MED ORDER — BISACODYL 10 MG RE SUPP: 10.0000 mg | Freq: Every day | RECTAL | Status: DC | PRN

## 2011-10-15 MED ORDER — SODIUM CHLORIDE 0.9 % IJ SOLN: 3.0000 mL | INTRAMUSCULAR | Status: DC | PRN

## 2011-10-15 MED ORDER — METOPROLOL TARTRATE 25 MG PO TABS
25.0000 mg | ORAL_TABLET | Freq: Two times a day (BID) | ORAL | Status: DC
Start: 2011-10-16 — End: 2011-10-17
  Administered 2011-10-16 – 2011-10-17 (×3): 25 mg via ORAL
  Filled 2011-10-15 (×4): qty 1

## 2011-10-15 MED ORDER — PANTOPRAZOLE SODIUM 40 MG PO TBEC
40.0000 mg | DELAYED_RELEASE_TABLET | Freq: Every day | ORAL | Status: DC
  Administered 2011-10-15 – 2011-10-20 (×6): 40 mg via ORAL
  Filled 2011-10-15 (×5): qty 1

## 2011-10-15 MED ORDER — ACETAMINOPHEN 325 MG PO TABS
650.0000 mg | ORAL_TABLET | Freq: Four times a day (QID) | ORAL | Status: DC | PRN
  Administered 2011-10-19: 650 mg via ORAL
  Filled 2011-10-15: qty 2

## 2011-10-15 MED ORDER — DOCUSATE SODIUM 100 MG PO CAPS: 200.0000 mg | ORAL_CAPSULE | Freq: Every day | ORAL | Status: DC

## 2011-10-15 MED ORDER — POTASSIUM CHLORIDE 10 MEQ/50ML IV SOLN
INTRAVENOUS | Status: AC
  Administered 2011-10-15: 10 meq
  Filled 2011-10-15: qty 100

## 2011-10-15 MED ORDER — MAGNESIUM HYDROXIDE 400 MG/5ML PO SUSP: 30.0000 mL | Freq: Every day | ORAL | Status: DC | PRN

## 2011-10-15 MED ORDER — FUROSEMIDE 40 MG PO TABS
40.0000 mg | ORAL_TABLET | Freq: Every day | ORAL | Status: DC
  Administered 2011-10-15 – 2011-10-16 (×2): 40 mg via ORAL
  Filled 2011-10-15 (×2): qty 1

## 2011-10-15 NOTE — Progress Notes (Signed)
10/15/2011 5:35 PM Nursing note At time of tx from 2300 to 2000, pt bp noted to be elevated at 174/95. Pt. Allowed to rest. Upon recheck at 1715, bp noted at 187/95. HR 83. Doree Fudge [facility] paged and made aware. Pt. Currently prescribed medications as well as home medications reviewed with PA. Verbal orders received and enacted. Will continue to monitor closely.  Cyan Clippinger, Blanchard Kelch

## 2011-10-15 NOTE — Plan of Care (Signed)
Problem: Phase III Progression Outcomes Goal: Time patient transferred to PCTU/Telemetry POD Outcome: Completed/Met Date Met:  10/15/11 1615pm-pt arrived safely to 2018. Pt ambulated in unit of 2300 and then ambulated to 2018. VS stable prior and throughout ambulation. Pt granddaughter present.. No personal belongings that could be taken up to patient's new room. Pt family made aware of the transfer. Report given to receiving RN, Judeth Cornfield, RN. Hardeep Reetz, Charity fundraiser.

## 2011-10-15 NOTE — Progress Notes (Signed)
Patient ID: [name], female   DOB: 1940-11-06, 71 y.o.   MRN: [id number] TCTS DAILY PROGRESS NOTE                   [address]            [name] [address]          [phone]      3 Days Post-Op Procedure(s) (LRB): EXPLORATION POST OPERATIVE OPEN HEART (N/A)  Total Length of Stay:  LOS: 3 days   Subjective: Awake and alert, ambulating well  Objective: Vital signs in last 24 hours: Temp:  [98 F (36.7 C)-98.7 F (37.1 C)] 98.5 F (36.9 C) (08/25 0836) Pulse Rate:  [58-89] 89  (08/25 0835) Cardiac Rhythm:  [-] Normal sinus rhythm (08/25 0400) Resp:  [14-29] 22  (08/25 0835) BP: (107-154)/(68-91) 134/73 mmHg (08/25 0835) SpO2:  [92 %-98 %] 93 % (08/25 0835) Weight:  [159 lb 2.8 oz (72.2 kg)] 159 lb 2.8 oz (72.2 kg) (08/25 0500)  Filed Weights   10/13/11 0630 10/14/11 0700 10/15/11 0500  Weight: 159 lb 2.8 oz (72.2 kg) 159 lb 9.8 oz (72.4 kg) 159 lb 2.8 oz (72.2 kg)    Weight change: -7.1 oz (-0.2 kg)   Hemodynamic parameters for last 24 hours:    Intake/Output from previous day: 08/24 0701 - 08/25 0700 In: 1438 [P.O.:720; I.V.:460; IV Piggyback:258] Out: 2455 [Urine:2370; Chest Tube:85]  Intake/Output this shift: Total I/O In: 50 [IV Piggyback:50] Out: 30 [Urine:30]  Current Meds: Scheduled Meds:   . acetaminophen  1,000 mg Oral Q6H   Or  . acetaminophen (TYLENOL) oral liquid 160 mg/5 mL  975 mg Per Tube Q6H  . aspirin EC  325 mg Oral Daily   Or  . aspirin  324 mg Per Tube Daily  . bisacodyl  10 mg Oral Daily   Or  . bisacodyl  10 mg Rectal Daily  . cefUROXime (ZINACEF)  IV  1.5 g Intravenous Q12H  . docusate sodium  200 mg Oral Daily  . feeding supplement  237 mL Oral BID BM  . furosemide  40 mg Intravenous BID  . insulin aspart  0-24 Units Subcutaneous Q4H  . metoprolol tartrate  12.5 mg Oral BID   Or  . metoprolol tartrate  12.5 mg Per Tube BID  . pantoprazole  40 mg Oral Q1200  . potassium chloride      . sodium  chloride  3 mL Intravenous Q12H   Continuous Infusions:   . sodium chloride 20 mL (10/12/11 1800)  . sodium chloride 20 mL (10/12/11 1345)  . sodium chloride    . lactated ringers 20 mL (10/12/11 1345)  . milrinone Stopped (10/13/11 0900)  . nitroGLYCERIN Stopped (10/13/11 0500)  . nitroPRUSSide Stopped (10/13/11 0345)  . phenylephrine (NEO-SYNEPHRINE) Adult infusion Stopped (10/13/11 0731)   PRN Meds:.metoprolol, morphine injection, ondansetron (ZOFRAN) IV, oxyCODONE, potassium chloride, sodium chloride  General appearance: alert, cooperative and no distress Neurologic: intact Heart: regular rate and rhythm, S1, S2 normal, no murmur, click, rub or gallop and normal apical impulse Lungs: clear to auscultation bilaterally and normal percussion bilaterally Abdomen: soft, non-tender; bowel sounds normal; no masses,  no organomegaly Extremities: extremities normal, atraumatic, no cyanosis or edema and Homans sign is negative, no sign of DVT Wound: sternum stable  Lab Results: CBC: Basename 10/15/11 0439 10/14/11 0420  WBC 10.2 10.0  HGB 10.1* 11.0*  HCT 30.2* 32.5*  PLT 121* 124*   BMET:  Basename 10/15/11 0439 10/14/11 0420  NA 143 142  K 3.4* 3.6  CL 106 105  CO2 32 30  GLUCOSE 102* 116*  BUN 22 23  CREATININE 0.84 1.06  CALCIUM 8.7 9.0    PT/INR:  Basename 10/12/11 2337  LABPROT 16.3*  INR 1.29   Radiology: Dg Chest Portable 1 View In Am  10/14/2011  *RADIOLOGY REPORT*  Clinical Data: Cardiac valve surgery.  PORTABLE CHEST - 1 VIEW  Comparison: 10/13/2011  Findings: Bilateral chest drains are present.  The mediastinal drain has been removed.  Swan-Ganz catheter has been removed. Heart size remains enlarged.  Increased densities at the left lung base probably represent a combination of atelectasis and some pleural fluid.  Negative for pneumothorax.  Right jugular central line is still present. Few densities at the right costophrenic angle.  IMPRESSION: Increased  densities at the left lung base most likely represent consolidation or atelectasis.  Left pleural fluid cannot be excluded despite the left chest tube.  Cardiomegaly.   Original Report Authenticated By: Richarda Overlie, M.D.      Assessment/Plan: S/P Procedure(s) (LRB): EXPLORATION POST OPERATIVE OPEN HEART (N/A) Mobilize Diuresis Plan for transfer to step-down: see transfer orders replace k for hypokalemia     Delight Ovens MD  Beeper [phone] Office [phone] 10/15/2011 9:37 AM

## 2011-10-16 ENCOUNTER — Inpatient Hospital Stay (HOSPITAL_COMMUNITY): Payer: Medicare Other

## 2011-10-16 LAB — TYPE AND SCREEN
ABO/RH(D): A POS
Antibody Screen: NEGATIVE
Unit division: 0
Unit division: 0
Unit division: 0
Unit division: 0
Unit division: 0
Unit division: 0
Unit division: 0
Unit division: 0
Unit division: 0
Unit division: 0
Unit division: 0
Unit division: 0
Unit division: 0

## 2011-10-16 LAB — CBC
HCT: 30.6 % — ABNORMAL LOW (ref 36.0–46.0)
Hemoglobin: 10 g/dL — ABNORMAL LOW (ref 12.0–15.0)
MCH: 29.5 pg (ref 26.0–34.0)
MCHC: 32.7 g/dL (ref 30.0–36.0)
MCV: 90.3 fL (ref 78.0–100.0)
Platelets: 120 10*3/uL — ABNORMAL LOW (ref 150–400)
RBC: 3.39 MIL/uL — ABNORMAL LOW (ref 3.87–5.11)
RDW: 15.6 % — ABNORMAL HIGH (ref 11.5–15.5)
WBC: 9.5 10*3/uL (ref 4.0–10.5)

## 2011-10-16 LAB — PROTIME-INR
INR: 1.07 (ref 0.00–1.49)
Prothrombin Time: 14.1 seconds (ref 11.6–15.2)

## 2011-10-16 LAB — BASIC METABOLIC PANEL
BUN: 17 mg/dL (ref 6–23)
CO2: 28 mEq/L (ref 19–32)
Calcium: 8.6 mg/dL (ref 8.4–10.5)
Chloride: 103 mEq/L (ref 96–112)
Creatinine, Ser: 0.66 mg/dL (ref 0.50–1.10)
GFR calc Af Amer: >90 mL/min (ref >90)
GFR calc non Af Amer: 87 mL/min — ABNORMAL LOW (ref >90)
Glucose, Bld: 87 mg/dL (ref 70–99)
Potassium: 3.7 mEq/L (ref 3.5–5.1)
Sodium: 138 mEq/L (ref 135–145)

## 2011-10-16 LAB — HEPATIC FUNCTION PANEL
ALT: 16 U/L (ref 0–35)
AST: 21 U/L (ref 0–37)
Albumin: 2.2 g/dL — ABNORMAL LOW (ref 3.5–5.2)
Alkaline Phosphatase: 70 U/L (ref 39–117)
Bilirubin, Direct: 0.2 mg/dL (ref 0.0–0.3)
Indirect Bilirubin: 0.3 mg/dL (ref 0.3–0.9)
Total Bilirubin: 0.5 mg/dL (ref 0.3–1.2)
Total Protein: 5.2 g/dL — ABNORMAL LOW (ref 6.0–8.3)

## 2011-10-16 LAB — TSH: TSH: 2.788 u[IU]/mL (ref 0.350–4.500)

## 2011-10-16 LAB — GLUCOSE, CAPILLARY
Glucose-Capillary: 92 mg/dL (ref 70–99)
Glucose-Capillary: 123 mg/dL — ABNORMAL HIGH (ref 70–99)
Glucose-Capillary: 109 mg/dL — ABNORMAL HIGH (ref 70–99)

## 2011-10-16 MED ORDER — LOSARTAN POTASSIUM 50 MG PO TABS
50.0000 mg | ORAL_TABLET | Freq: Every day | ORAL | Status: DC
  Administered 2011-10-16 – 2011-10-18 (×3): 50 mg via ORAL
  Filled 2011-10-16 (×4): qty 1

## 2011-10-16 MED ORDER — WARFARIN SODIUM 2.5 MG PO TABS
2.5000 mg | ORAL_TABLET | Freq: Once | ORAL | Status: AC
  Administered 2011-10-16: 2.5 mg via ORAL
  Filled 2011-10-16: qty 1

## 2011-10-16 MED ORDER — POTASSIUM CHLORIDE CRYS ER 20 MEQ PO TBCR
20.0000 meq | EXTENDED_RELEASE_TABLET | Freq: Two times a day (BID) | ORAL | Status: DC
  Administered 2011-10-16 – 2011-10-20 (×8): 20 meq via ORAL
  Filled 2011-10-16 (×10): qty 1

## 2011-10-16 MED ORDER — FUROSEMIDE 40 MG PO TABS
40.0000 mg | ORAL_TABLET | Freq: Two times a day (BID) | ORAL | Status: DC
  Administered 2011-10-16 – 2011-10-18 (×5): 40 mg via ORAL
  Filled 2011-10-16 (×9): qty 1

## 2011-10-16 MED ORDER — AMIODARONE HCL 200 MG PO TABS: 200.0000 mg | ORAL_TABLET | Freq: Two times a day (BID) | ORAL | Status: DC

## 2011-10-16 MED ORDER — WARFARIN - PHYSICIAN DOSING INPATIENT
Freq: Every day | Status: DC
  Administered 2011-10-18 – 2011-10-19 (×2)

## 2011-10-16 MED ORDER — AMIODARONE HCL 200 MG PO TABS
400.0000 mg | ORAL_TABLET | Freq: Three times a day (TID) | ORAL | Status: DC
  Administered 2011-10-16 – 2011-10-17 (×5): 400 mg via ORAL
  Filled 2011-10-16 (×9): qty 2

## 2011-10-16 MED ORDER — ASPIRIN 81 MG PO CHEW
81.0000 mg | CHEWABLE_TABLET | Freq: Every day | ORAL | Status: DC
  Administered 2011-10-17 – 2011-10-20 (×4): 81 mg via ORAL
  Filled 2011-10-16 (×4): qty 1

## 2011-10-16 MED FILL — Lidocaine HCl IV Inj 20 MG/ML: INTRAVENOUS | Qty: 5 | Status: AC

## 2011-10-16 MED FILL — Sodium Chloride Irrigation Soln 0.9%: Qty: 3000 | Status: AC

## 2011-10-16 MED FILL — Mannitol IV Soln 20%: INTRAVENOUS | Qty: 500 | Status: AC

## 2011-10-16 MED FILL — Sodium Chloride IV Soln 0.9%: INTRAVENOUS | Qty: 1000 | Status: AC

## 2011-10-16 MED FILL — Electrolyte-R (PH 7.4) Solution: INTRAVENOUS | Qty: 4000 | Status: AC

## 2011-10-16 MED FILL — Heparin Sodium (Porcine) Inj 1000 Unit/ML: INTRAMUSCULAR | Qty: 10 | Status: AC

## 2011-10-16 MED FILL — Heparin Sodium (Porcine) Inj 1000 Unit/ML: INTRAMUSCULAR | Qty: 30 | Status: AC

## 2011-10-16 MED FILL — Sodium Bicarbonate IV Soln 8.4%: INTRAVENOUS | Qty: 50 | Status: AC

## 2011-10-16 NOTE — Progress Notes (Signed)
Pt ambulated 350 ft with rolling walker on RA. Assist x1. Pt tolerated well. Back to chair with call bell in reach. Dion Saucier

## 2011-10-16 NOTE — Progress Notes (Signed)
CARDIAC REHAB PHASE I   PRE:  Rate/Rhythm: 82afib  BP:  Supine: 120/84  Sitting:   Standing:    SaO2: 93%RA  MODE:  Ambulation: 350 ft   POST:  Rate/Rhythem: 101afib  BP:  Supine:   Sitting: 131/91  Standing:    SaO2: 90%RA 0840-0900 Pt walked 350 ft on RA with rolling walker and asst x 2. Pt has difficulty with walker. Would probably do better with gait belt and asst x 1. Stated very active and used to run prior to health problems. To recliner and then to bathroom after walk. Notified pt's RN that pt in bathroom. Tolerated walk well.  Duanne Limerick

## 2011-10-16 NOTE — Progress Notes (Addendum)
[name]            [place] [address]          [phone]     4 Days Post-Op  Procedure(s) (LRB): EXPLORATION POST OPERATIVE OPEN HEART (N/A) Subjective: Minimal pain, feels better in general  Objective  Telemetry afib with CVR  Temp:  [98.3 F (36.8 C)-98.5 F (36.9 C)] 98.3 F (36.8 C) (08/26 0356) Pulse Rate:  [57-89] 57  (08/26 0356) Resp:  [13-29] 18  (08/26 0356) BP: (118-187)/(73-112) 160/86 mmHg (08/26 0500) SpO2:  [91 %-99 %] 91 % (08/26 0356) Weight:  [163 lb 1.6 oz (73.982 kg)] 163 lb 1.6 oz (73.982 kg) (08/26 0356)   Intake/Output Summary (Last 24 hours) at 10/16/11 0748 Last data filed at 10/15/11 1520  Gross per 24 hour  Intake    290 ml  Output    315 ml  Net    -25 ml       General appearance: alert, cooperative and no distress Heart: irregularly irregular rhythm and soft systolic murmur, + rub Lungs: mildly diminished in bases Abdomen: soft, mild distension, + BS Extremities: mild edema Wound: incis healing well  Lab Results:  Basename 10/15/11 0439 10/14/11 0420 10/13/11 1708  NA 143 142 --  K 3.4* 3.6 --  CL 106 105 --  CO2 32 30 --  GLUCOSE 102* 116* --  BUN 22 23 --  CREATININE 0.84 1.06 --  CALCIUM 8.7 9.0 --  MG -- -- 2.1  PHOS -- -- --    Basename 10/15/11 0439  AST 21  ALT 16  ALKPHOS 58  BILITOT 0.7  PROT 5.2*  ALBUMIN 2.2*   No results found for this basename: LIPASE:2,AMYLASE:2 in the last 72 hours  Basename 10/16/11 0500 10/15/11 0439  WBC 9.5 10.2  NEUTROABS -- --  HGB 10.0* 10.1*  HCT 30.6* 30.2*  MCV 90.3 88.8  PLT 120* 121*   No results found for this basename: CKTOTAL:4,CKMB:4,TROPONINI:4 in the last 72 hours No components found with this basename: POCBNP:3 No results found for this basename: DDIMER in the last 72 hours No results found for this basename: HGBA1C in the last 72 hours No results found for this basename: CHOL,HDL,LDLCALC,TRIG,CHOLHDL in the last 72  hours No results found for this basename: TSH,T4TOTAL,FREET3,T3FREE,THYROIDAB in the last 72 hours No results found for this basename: VITAMINB12,FOLATE,FERRITIN,TIBC,IRON,RETICCTPCT in the last 72 hours  Medications: Scheduled    . amiodarone  200 mg Oral Q12H  . amiodarone  400 mg Oral Q8H  . aspirin EC  325 mg Oral Daily   Or  . aspirin  324 mg Per Tube Daily  . bisacodyl  10 mg Oral Daily   Or  . bisacodyl  10 mg Rectal Daily  . feeding supplement  237 mL Oral BID BM  . furosemide  40 mg Oral Daily  . insulin aspart  0-24 Units Subcutaneous TID AC & HS  . losartan  25 mg Oral Daily  . metoprolol tartrate  12.5 mg Oral Once  . metoprolol tartrate  25 mg Oral BID  . moving right along book   Does not apply Once  . pantoprazole  40 mg Oral Q1200  . potassium chloride      . potassium chloride  20 mEq Oral Daily  . spironolactone  25 mg Oral Daily  . DISCONTD: acetaminophen (TYLENOL) oral liquid  160 mg/5 mL  975 mg Per Tube Q6H  . DISCONTD: acetaminophen  1,000 mg Oral Q6H  . DISCONTD: docusate sodium  200 mg Oral Daily  . DISCONTD: docusate sodium  200 mg Oral Daily  . DISCONTD: furosemide  40 mg Oral Daily  . DISCONTD: insulin aspart  0-24 Units Subcutaneous Q4H  . DISCONTD: metoprolol tartrate  12.5 mg Per Tube BID  . DISCONTD: metoprolol tartrate  12.5 mg Oral BID  . DISCONTD: moving right along book   Does not apply Once  . DISCONTD: pantoprazole  40 mg Oral Q1200  . DISCONTD: sodium chloride  3 mL Intravenous Q12H  . DISCONTD: sodium chloride  3 mL Intravenous Q12H     Radiology/Studies:  Dg Chest Port 1 View  10/15/2011  *RADIOLOGY REPORT*  Clinical Data: Postop MVR/AVR, chest tubes removed  PORTABLE CHEST - 1 VIEW  Comparison: 10/14/2011  Findings: Interval removal of bilateral chest tubes.  No pneumothorax is seen.  Pulmonary vascular congestion without frank interstitial edema. Small bilateral pleural effusions, left greater than right.  Cardiomegaly.  Prosthetic  aortic and mitral valves.  Stable right IJ venous sheath.  IMPRESSION: Interval removal of bilateral chest tubes.  No pneumothorax is seen.  Cardiomegaly with small bilateral pleural effusions, left greater than right.   Original Report Authenticated By: Charline Bills, M.D.     INR: Will add last result for INR, ABG once components are confirmed Will add last 4 CBG results once components are confirmed  Assessment/Plan: S/P Procedure(s) (LRB): EXPLORATION POST OPERATIVE OPEN HEART (N/A)/ AVR/ MV repair  1. Doing well 2 afib with rate controlled on amio/ lopressor-amio just started 3 cont gentle diuresis 4 elevated BP, increase losartan to 50 with normal creat 5pulm toilet/ rehab, planing to go to facility at discharge  LOS: 4 days    [name],[name] 8/26/20137:48 AM     Chart reviewed, patient examined, agree with above. Start coumadin CXR shows bilateral pleural effusions L>R  With bibasilar atel. Will increase diuretic to bid. She had a lot of edema and ascites at the time of surgery so preop wt is significantly above her dry weight.

## 2011-10-17 LAB — GLUCOSE, CAPILLARY: Glucose-Capillary: 124 mg/dL — ABNORMAL HIGH (ref 70–99)

## 2011-10-17 LAB — PROTIME-INR
INR: 1.1 (ref 0.00–1.49)
Prothrombin Time: 14.4 seconds (ref 11.6–15.2)

## 2011-10-17 MED ORDER — ENSURE COMPLETE PO LIQD
237.0000 mL | Freq: Every day | ORAL | Status: DC
  Administered 2011-10-19: 237 mL via ORAL

## 2011-10-17 MED ORDER — AMIODARONE HCL 200 MG PO TABS
400.0000 mg | ORAL_TABLET | Freq: Two times a day (BID) | ORAL | Status: DC
  Administered 2011-10-17 – 2011-10-20 (×6): 400 mg via ORAL
  Filled 2011-10-17 (×7): qty 2

## 2011-10-17 MED ORDER — WARFARIN SODIUM 2.5 MG PO TABS
2.5000 mg | ORAL_TABLET | Freq: Once | ORAL | Status: AC
  Administered 2011-10-17: 2.5 mg via ORAL
  Filled 2011-10-17: qty 1

## 2011-10-17 MED ORDER — METOPROLOL TARTRATE 12.5 MG HALF TABLET
12.5000 mg | ORAL_TABLET | Freq: Two times a day (BID) | ORAL | Status: DC
  Administered 2011-10-17 – 2011-10-20 (×6): 12.5 mg via ORAL
  Filled 2011-10-17 (×7): qty 1

## 2011-10-17 NOTE — Progress Notes (Signed)
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  Pt with exploration of incision on 10/14/11.  Pt making steady progress.    Follow Up Recommendations  Skilled nursing facility    Barriers to Discharge        Equipment Recommendations  Defer to next venue    Recommendations for Other Services    Frequency Min 3X/week   Plan Discharge plan remains appropriate;Frequency remains appropriate    Precautions / Restrictions Precautions Precautions: Sternal;Fall   Pertinent Vitals/Pain SaO2 >90% during treatment.    Mobility  Transfers Sit to Stand: 4: Min assist;With upper extremity assist;With armrests;From chair/3-in-1;From toilet;From bed Stand to Sit: 4: Min assist;To toilet;To chair/3-in-1;To bed;With armrests;With upper extremity assist Details for Transfer Assistance: Pt with minimal use of arms during transfer. Ambulation/Gait Ambulation/Gait Assistance: 4: Min assist Ambulation Distance (Feet): 110 Feet Assistive device: 1 person hand held assist Ambulation/Gait Assistance Details: Pt started with rolling walker but was putting walker too far in front. Switched to hand-held assist. Verbal cues to stand more erect. Gait Pattern: Step-through pattern;Decreased stride length;Trunk flexed General Gait Details: Pt limited distance due to had to return to bathroom quickly.    Exercises     PT Diagnosis:    PT Problem List:   PT Treatment Interventions:     PT Goals Acute Rehab PT Goals PT Goal: Sit to Stand - Progress: Progressing toward goal PT Goal: Stand to Sit - Progress: Progressing toward goal PT Goal: Ambulate - Progress: Progressing toward goal  Visit Information  Last PT Received On: 10/17/11 Assistance Needed: +1    Subjective Data  Subjective: Pt states she doesn't feel as good today as she did yesterday.   Cognition  Overall Cognitive Status: Appears within functional limits for tasks assessed/performed Arousal/Alertness: Awake/alert Orientation Level: Appears intact for tasks assessed Behavior During Session: [facility] for tasks performed    Balance  Static Standing Balance Static Standing - Balance Support: Left upper extremity supported;During functional activity Static Standing - Level of Assistance: 4: Min assist  End of Session PT - End of Session Equipment Utilized During Treatment: Gait belt Activity Tolerance: Patient tolerated treatment well Patient left: in chair;with call bell/phone within reach Nurse Communication: Mobility status   GP     Daden Mahany 10/17/2011, 4:19 PM  [employer] PT [phone]

## 2011-10-17 NOTE — Progress Notes (Signed)
CARDIAC REHAB PHASE I   PRE:  Rate/Rhythm: 75 afib    BP: sitting 131/87    SaO2: 88-92 RA  MODE:  Ambulation: 230 ft   POST:  Rate/Rhythm: 87 afib    BP: sitting 168/81     SaO2: 89-90 RA  Pt steadier today with RW but SaO2 86-88 RA walking, 84 RA on return to BR. Think inaccurate pulse ox. On return to chair, room pulse ox 89-90. CR pulse ox 86 RA. Nevertheless, needs more IS work. Denied significant SOB. Will f/u. [phone]  Harriet Masson CES, ACSM

## 2011-10-17 NOTE — Progress Notes (Signed)
Nutrition Follow-up  Intervention:    Continue Ensure Complete daily (350 kcals, 13 gm protein per 8 fl oz per bottle) RD to follow for nutrition care plan  Assessment:   Patient states her appetite is very good. PO intake mostly 75-100% per flowsheet records. Drinking Ensure Complete supplement daily.  Diet Order:  Carbohydrate Modified Medium Calorie  Meds: Scheduled Meds:   . amiodarone  200 mg Oral Q12H  . amiodarone  400 mg Oral Q8H  . aspirin  81 mg Oral Daily  . bisacodyl  10 mg Oral Daily   Or  . bisacodyl  10 mg Rectal Daily  . feeding supplement  237 mL Oral BID BM  . furosemide  40 mg Oral BID  . insulin aspart  0-24 Units Subcutaneous TID AC & HS  . losartan  50 mg Oral Daily  . metoprolol tartrate  25 mg Oral BID  . pantoprazole  40 mg Oral Q1200  . potassium chloride  20 mEq Oral BID  . spironolactone  25 mg Oral Daily  . warfarin  2.5 mg Oral ONCE-1800  . warfarin  2.5 mg Oral ONCE-1800  . Warfarin - Physician Dosing Inpatient   Does not apply q1800  . DISCONTD: aspirin  324 mg Per Tube Daily  . DISCONTD: aspirin EC  325 mg Oral Daily  . DISCONTD: furosemide  40 mg Oral Daily  . DISCONTD: potassium chloride  20 mEq Oral Daily   Continuous Infusions:  PRN Meds:.sodium chloride, acetaminophen, alum & mag hydroxide-simeth, bisacodyl, bisacodyl, ondansetron (ZOFRAN) IV, oxyCODONE, traMADol  Labs:  CMP     Component Value Date/Time   NA 138 10/16/2011 0500   K 3.7 10/16/2011 0500   CL 103 10/16/2011 0500   CO2 28 10/16/2011 0500   GLUCOSE 87 10/16/2011 0500   BUN 17 10/16/2011 0500   CREATININE 0.66 10/16/2011 0500   CALCIUM 8.6 10/16/2011 0500   PROT 5.2* 10/16/2011 0500   ALBUMIN 2.2* 10/16/2011 0500   AST 21 10/16/2011 0500   ALT 16 10/16/2011 0500   ALKPHOS 70 10/16/2011 0500   BILITOT 0.5 10/16/2011 0500   GFRNONAA 87* 10/16/2011 0500   GFRAA >90 10/16/2011 0500     Intake/Output Summary (Last 24 hours) at 10/17/11 1458 Last data filed at 10/17/11 1334  Gross per 24 hour  Intake    480 ml  Output      0 ml  Net    480 ml    CBG (last 3)   Basename 10/17/11 1117 10/16/11 2034 10/16/11 1605  GLUCAP 81 109* 110*    Weight Status:  74.7 kg (8/27) -- stable  Re-estimated needs:  1800-2000 kcals, 80-90 gm protein  Nutrition Dx:  Increased Nutrient Needs, ongoing  Goal:  Oral intake with meals & supplements to meet >/= 90% of estimated nutrition needs, met  Monitor:  PO & supplemental intake, weight, labs, I/O's  Kirkland Hun, RD, LDN Pager #: [phone] After-Hours Pager #: [phone]

## 2011-10-17 NOTE — Progress Notes (Addendum)
[address]            [place] [address]          [phone]     5 Days Post-Op  Procedure(s) (LRB): EXPLORATION POST OPERATIVE OPEN HEART (N/A) Subjective: continues to progress nicely, says she urinated quite a bit with increased diuretic  Objective  Telemetry afib with CVR, into 50's at times  Temp:  [98 F (36.7 C)-98.6 F (37 C)] 98 F (36.7 C) (08/27 0531) Pulse Rate:  [61-77] 71  (08/27 0531) Resp:  [16-18] 18  (08/27 0531) BP: (142-168)/(86-91) 168/90 mmHg (08/27 0531) SpO2:  [94 %-95 %] 94 % (08/27 0531) Weight:  [164 lb 10.9 oz (74.7 kg)] 164 lb 10.9 oz (74.7 kg) (08/27 0514)  No intake or output data in the 24 hours ending 10/17/11 0737     General appearance: alert, cooperative and no distress Heart: irregularly irregular rhythm and soft systolic murmur Lungs: mildly dim left base Abdomen: benign Extremities: improved edema Wound: incisions healing well  Lab Results:  Basename 10/16/11 0500 10/15/11 0439  NA 138 143  K 3.7 3.4*  CL 103 106  CO2 28 32  GLUCOSE 87 102*  BUN 17 22  CREATININE 0.66 0.84  CALCIUM 8.6 8.7  MG -- --  PHOS -- --    Basename 10/16/11 0500 10/15/11 0439  AST 21 21  ALT 16 16  ALKPHOS 70 58  BILITOT 0.5 0.7  PROT 5.2* 5.2*  ALBUMIN 2.2* 2.2*   No results found for this basename: LIPASE:2,AMYLASE:2 in the last 72 hours  Basename 10/16/11 0500 10/15/11 0439  WBC 9.5 10.2  NEUTROABS -- --  HGB 10.0* 10.1*  HCT 30.6* 30.2*  MCV 90.3 88.8  PLT 120* 121*   No results found for this basename: CKTOTAL:4,CKMB:4,TROPONINI:4 in the last 72 hours No components found with this basename: POCBNP:3 No results found for this basename: DDIMER in the last 72 hours No results found for this basename: HGBA1C in the last 72 hours No results found for this basename: CHOL,HDL,LDLCALC,TRIG,CHOLHDL in the last 72 hours  Basename 10/16/11 0500  TSH 2.788  T4TOTAL --  T3FREE --  THYROIDAB --    No results found for this basename: VITAMINB12,FOLATE,FERRITIN,TIBC,IRON,RETICCTPCT in the last 72 hours  Medications: Scheduled    . amiodarone  200 mg Oral Q12H  . amiodarone  400 mg Oral Q8H  . aspirin  81 mg Oral Daily  . bisacodyl  10 mg Oral Daily   Or  . bisacodyl  10 mg Rectal Daily  . feeding supplement  237 mL Oral BID BM  . furosemide  40 mg Oral BID  . insulin aspart  0-24 Units Subcutaneous TID AC & HS  . losartan  50 mg Oral Daily  . metoprolol tartrate  25 mg Oral BID  . pantoprazole  40 mg Oral Q1200  . potassium chloride  20 mEq Oral BID  . spironolactone  25 mg Oral Daily  . warfarin  2.5 mg Oral ONCE-1800  . Warfarin - Physician Dosing Inpatient   Does not apply q1800  . DISCONTD: aspirin  324 mg Per Tube Daily  . DISCONTD: aspirin EC  325 mg Oral Daily  . DISCONTD: furosemide  40 mg Oral Daily  . DISCONTD: losartan  25 mg Oral Daily  . DISCONTD: potassium chloride  20 mEq Oral Daily  Radiology/Studies:  Dg Chest 2 View  10/16/2011  *RADIOLOGY REPORT*  Clinical Data: Follow up of pleural effusions.  The patient is feeling better.  CHEST - 2 VIEW  Comparison: 1 day prior  Findings: Prior median sternotomy.  Status post aortic and mitral valve repair.  Removal of right IJ Cordis sheath.  Moderate cardiomegaly.  Left greater than right bilateral pleural effusions are not significantly changed. No pneumothorax.  No congestive failure.  Minimal improvement in left greater than right bibasilar airspace disease.  IMPRESSION:  1.  Persistent left greater than right small bilateral pleural effusions with adjacent bibasilar airspace disease, likely atelectasis. 2.  Removal of right IJ Cordis sheath.   Original Report Authenticated By: Consuello Bossier, M.D.     INR:1.10 Will add last result for INR, ABG once components are confirmed Will add last 4 CBG results once components are confirmed  Assessment/Plan: S/P Procedure(s) (LRB): EXPLORATION POST OPERATIVE OPEN  HEART (N/A)  1. conts to do well 2 cont diuresis with lasix bid for another day 3 cont ac rx 4 cont amio, may have to decrease beta blocker dose  5 pulm toilet/rehab, snf at d/c -short term for rehab 6 repeat labs in am  LOS: 5 days    GOLD,[name] 8/27/20137:37 AM    Chart reviewed, patient examined, agree with above. A-fib with rate 60's - 70's. Will decrease amio to 400 bid and lopressor to 12.5 bid. Check ECG for intervals in am.

## 2011-10-18 ENCOUNTER — Encounter (HOSPITAL_COMMUNITY): Payer: Self-pay

## 2011-10-18 LAB — PROTIME-INR
INR: 1.14 (ref 0.00–1.49)
Prothrombin Time: 14.8 seconds (ref 11.6–15.2)

## 2011-10-18 LAB — BASIC METABOLIC PANEL
CO2: 30 mEq/L (ref 19–32)
Chloride: 100 mEq/L (ref 96–112)
Creatinine, Ser: 0.65 mg/dL (ref 0.50–1.10)
Sodium: 136 mEq/L (ref 135–145)

## 2011-10-18 LAB — GLUCOSE, CAPILLARY
Glucose-Capillary: 84 mg/dL (ref 70–99)
Glucose-Capillary: 95 mg/dL (ref 70–99)
Glucose-Capillary: 94 mg/dL (ref 70–99)
Glucose-Capillary: 96 mg/dL (ref 70–99)

## 2011-10-18 LAB — CBC
MCV: 91 fL (ref 78.0–100.0)
Platelets: 152 10*3/uL (ref 150–400)
RBC: 3.44 MIL/uL — ABNORMAL LOW (ref 3.87–5.11)
WBC: 8.1 10*3/uL (ref 4.0–10.5)

## 2011-10-18 MED ORDER — LOSARTAN POTASSIUM 50 MG PO TABS
50.0000 mg | ORAL_TABLET | Freq: Two times a day (BID) | ORAL | Status: DC
  Administered 2011-10-18 – 2011-10-20 (×4): 50 mg via ORAL
  Filled 2011-10-18 (×5): qty 1

## 2011-10-18 MED ORDER — WARFARIN VIDEO
Freq: Once | Status: AC
  Administered 2011-10-19: 12:00:00

## 2011-10-18 MED ORDER — PATIENT'S GUIDE TO USING COUMADIN BOOK
Freq: Once | Status: AC
  Administered 2011-10-18: 18:00:00
  Filled 2011-10-18: qty 1

## 2011-10-18 MED ORDER — WARFARIN SODIUM 5 MG PO TABS
5.0000 mg | ORAL_TABLET | Freq: Once | ORAL | Status: AC
  Administered 2011-10-18: 5 mg via ORAL
  Filled 2011-10-18: qty 1

## 2011-10-18 NOTE — Progress Notes (Signed)
CSW assessed pt at beside re: d/c needs.  CSW also presented pt with bed offers.  Pt asked for [facility].  They have made a bed offer.  CSW will contact SNF to confirm bed offer and relay d/c projection to SNF.  CSW will continue to assist as necessary. Vickii Penna, LCSWA [phone]  Clinical Social Work

## 2011-10-18 NOTE — Progress Notes (Signed)
Pt declined to ambulate last night, stated that she is tired and SOB. Will offer her again.

## 2011-10-18 NOTE — Progress Notes (Signed)
Pt ambulated 350 feet with RN and rolling walker; pt assisted to chair; call bell w/i reach; will cont. To monitor.

## 2011-10-18 NOTE — Progress Notes (Addendum)
[address]            [place] [address]          [phone]     6 Days Post-Op  Procedure(s) (LRB): EXPLORATION POST OPERATIVE OPEN HEART (N/A) Subjective Says she felt poorly yesterday, but better today  Objective  Telemetry afib/flutter with CVR  Temp:  [98 F (36.7 C)-98.2 F (36.8 C)] 98.2 F (36.8 C) (08/28 0408) Pulse Rate:  [72-87] 76  (08/28 0408) Resp:  [18] 18  (08/28 0408) BP: (144-168)/(71-90) 168/85 mmHg (08/28 0408) SpO2:  [90 %-100 %] 100 % (08/28 0408) Weight:  [164 lb 3.9 oz (74.5 kg)] 164 lb 3.9 oz (74.5 kg) (08/28 0408)   Intake/Output Summary (Last 24 hours) at 10/18/11 0731 Last data filed at 10/17/11 1725  Gross per 24 hour  Intake    720 ml  Output      0 ml  Net    720 ml       General appearance: alert, cooperative and no distress Heart: irregularly irregular rhythm and S1, S2 normal Lungs: dim in bases mildly Abdomen: soft, nontender, +BS Extremities: edema appears increased today in LE's Wound: incisions healing well  Lab Results:  Basename 10/18/11 0600 10/16/11 0500  NA 136 138  K 4.1 3.7  CL 100 103  CO2 30 28  GLUCOSE 108* 87  BUN 18 17  CREATININE 0.65 0.66  CALCIUM 9.1 8.6  MG -- --  PHOS -- --    Basename 10/16/11 0500  AST 21  ALT 16  ALKPHOS 70  BILITOT 0.5  PROT 5.2*  ALBUMIN 2.2*   No results found for this basename: LIPASE:2,AMYLASE:2 in the last 72 hours  Basename 10/18/11 0600 10/16/11 0500  WBC 8.1 9.5  NEUTROABS -- --  HGB 10.1* 10.0*  HCT 31.3* 30.6*  MCV 91.0 90.3  PLT 152 120*   No results found for this basename: CKTOTAL:4,CKMB:4,TROPONINI:4 in the last 72 hours No components found with this basename: POCBNP:3 No results found for this basename: DDIMER in the last 72 hours No results found for this basename: HGBA1C in the last 72 hours No results found for this basename: CHOL,HDL,LDLCALC,TRIG,CHOLHDL in the last 72 hours  Basename 10/16/11 0500  TSH  2.788  T4TOTAL --  T3FREE --  THYROIDAB --   No results found for this basename: VITAMINB12,FOLATE,FERRITIN,TIBC,IRON,RETICCTPCT in the last 72 hours  Medications: Scheduled    . amiodarone  200 mg Oral Q12H  . amiodarone  400 mg Oral BID  . aspirin  81 mg Oral Daily  . bisacodyl  10 mg Oral Daily   Or  . bisacodyl  10 mg Rectal Daily  . feeding supplement  237 mL Oral Q1500  . furosemide  40 mg Oral BID  . insulin aspart  0-24 Units Subcutaneous TID AC & HS  . losartan  50 mg Oral Daily  . metoprolol tartrate  12.5 mg Oral BID  . pantoprazole  40 mg Oral Q1200  . potassium chloride  20 mEq Oral BID  . spironolactone  25 mg Oral Daily  . warfarin  2.5 mg Oral ONCE-1800  . Warfarin - Physician Dosing Inpatient   Does not apply q1800  . DISCONTD: amiodarone  400 mg Oral Q8H  . DISCONTD: feeding supplement  237 mL Oral BID BM  . DISCONTD: metoprolol tartrate  25 mg Oral BID  Radiology/Studies:  No results found.  INR:1.14 Will add last result for INR, ABG once components are confirmed Will add last 4 CBG results once components are confirmed  Assessment/Plan: S/P Procedure(s) (LRB): EXPLORATION POST OPERATIVE OPEN HEART (N/A)  1. Overall doing well but didn't walk much yesterday as she was feeling poorly. Feels better this am 2 cont diuresis 3 labs ok , QTC -ok 4 remains in aflutter/afib, cont current rx- increase coumadin dose to 5 mg  LOS: 6 days    GOLD,[name] 8/28/20137:31 AM     Chart reviewed, patient examined, agree with above. I/O inaccurate so will have to follow weight and exam for fluid balance.

## 2011-10-18 NOTE — Progress Notes (Signed)
CARDIAC REHAB PHASE I   PRE:  Rate/Rhythm: 73 afib  BP:  Supine:   Sitting: 146/83  Standing:    SaO2: 995RA  MODE:  Ambulation: 350 ft   POST:  Rate/Rhythem: 82afib  BP:  Supine: 171/107 dinamapp, 150/108  manual  Sitting:   Standing:    SaO2: 94%RA 1115-1147 Pt had received morning meds except cozaar. To bathroom and then walked 350 ft with rolling walker. Pt wanted to use walker and stayed close to it. No complaints. Stated she felt stronger today. To recliner with call bell. Set up lunch.BP elevated after walk.  [name]

## 2011-10-18 NOTE — Clinical Social Work Psychosocial (Signed)
Clinical Social Work Department BRIEF PSYCHOSOCIAL ASSESSMENT 10/18/2011  Patient:  [name], [name]     Account Number:  [id number]     Admit date:  10/12/2011  Clinical Social Worker:  Read Drivers  Date/Time:  10/18/2011 04:59 PM  Referred by:  RN  Date Referred:  10/18/2011 Referred for  SNF Placement   Other Referral:   none   Interview type:  Patient Other interview type:   none    PSYCHOSOCIAL DATA Living Status:  ALONE Admitted from facility:   Level of care:   Primary support name:  sons Primary support relationship to patient:   Degree of support available:   good    CURRENT CONCERNS Current Concerns  Post-Acute Placement   Other Concerns:   none    SOCIAL WORK ASSESSMENT / PLAN CSW assessed pt at bedside.  Pt was in good spirits, alert and oriented.  Pt was excited about being able to receive rehab at a facility that her insurance would support.  Pt stated that she lived off of [address] and would like to go to SNF near her home that was a newer facility.  Her sons also live in that same area.   Assessment/plan status:  Psychosocial Support/Ongoing Assessment of Needs Other assessment/ plan:   none   Information/referral to community resources:   SNF    PATIENT'S/FAMILY'S RESPONSE TO PLAN OF CARE: Pt was receptive and appreciative of CSW willingness and assistance in d/c plans.        Vickii Penna, LCSWA [phone]  Clinical Social Work

## 2011-10-18 NOTE — Clinical Social Work Placement (Addendum)
Clinical Social Work Department CLINICAL SOCIAL WORK PLACEMENT NOTE 10/18/2011  Patient:  [name], [name]  Account Number:  [id number] Admit date:  10/12/2011  Clinical Social Worker:  Read Drivers  Date/time:  10/18/2011 05:04 PM  Clinical Social Work is seeking post-discharge placement for this patient at the following level of care:   SKILLED NURSING   (*CSW will update this form in Epic as items are completed)   10/18/2011  Patient/family provided with [facility] Department of Clinical Social Work's list of facilities offering this level of care within the geographic area requested by the patient (or if unable, by the patient's family).  10/18/2011  Patient/family informed of their freedom to choose among providers that offer the needed level of care, that participate in Medicare, Medicaid or managed care program needed by the patient, have an available bed and are willing to accept the patient.  10/18/2011  Patient/family informed of [facility]' ownership interest in [facility], as well as of the fact that they are under no obligation to receive care at this facility.  PASARR submitted to EDS on 10/18/2011 PASARR number received from EDS on 10/18/2011  FL2 transmitted to all facilities in geographic area requested by pt/family on  10/18/2011 FL2 transmitted to all facilities within larger geographic area on   Patient informed that his/her managed care company has contracts with or will negotiate with  certain facilities, including the following:     Patient/family informed of bed offers received:  10/18/2011 Patient chooses bed at [facility] Physician recommends and patient chooses bed at    Patient to be transferred to [facility] on   Patient to be transferred to facility by   The following physician request were entered in Epic:   Additional Comments:none  Vickii Penna, LCSWA [phone]  Clinical  Social Work

## 2011-10-19 LAB — PROTIME-INR
INR: 1.1 (ref 0.00–1.49)
Prothrombin Time: 14.4 seconds (ref 11.6–15.2)

## 2011-10-19 MED ORDER — AMIODARONE HCL 400 MG PO TABS: 200.0000 mg | ORAL_TABLET | Freq: Two times a day (BID) | ORAL | Status: DC

## 2011-10-19 MED ORDER — FUROSEMIDE 40 MG PO TABS
40.0000 mg | ORAL_TABLET | Freq: Every day | ORAL | Status: DC
  Administered 2011-10-19 – 2011-10-20 (×2): 40 mg via ORAL
  Filled 2011-10-19 (×2): qty 1

## 2011-10-19 MED ORDER — OXYCODONE HCL 5 MG PO TABS: 5.0000 mg | ORAL_TABLET | Freq: Four times a day (QID) | ORAL | Status: AC | PRN

## 2011-10-19 MED ORDER — WARFARIN SODIUM 5 MG PO TABS: 5.0000 mg | ORAL_TABLET | Freq: Every day | ORAL | Status: DC

## 2011-10-19 MED ORDER — WARFARIN SODIUM 7.5 MG PO TABS
7.5000 mg | ORAL_TABLET | Freq: Once | ORAL | Status: AC
  Administered 2011-10-19: 7.5 mg via ORAL
  Filled 2011-10-19: qty 1

## 2011-10-19 NOTE — Progress Notes (Signed)
Pt not wanting to ambulate in hallway at this time due to shoulder pain; pt encouraged to ambulate prior to going to bed tonight; will cont. To monitor.

## 2011-10-19 NOTE — Progress Notes (Signed)
CARDIAC REHAB PHASE I   PRE:  Rate/Rhythm: 84afib  BP:  Supine: 171/108 dinamapp  Sitting: 185/90 dinamapp  154/90 manual  Standing:    SaO2: 96%RA  MODE:  Ambulation: 400 ft   POST:  Rate/Rhythem: 92afib  BP:  Supine:   Sitting: 160/108 manual  Standing:    SaO2: 91%RA 1110-1142 Asked pt if she needed bathroom prior to walk. Pt said no but walk cut short due to bathroom need. Walked 400 ft on RA with rolling walker and asst x 1 with fairly steady gait. Tolerated well. Would have gone farther if not for bathroom need. To recliner after walk. Pt's BP elevated as documented. Was elevated after walk yesterday. Discussed with pt's RN.   Duanne Limerick

## 2011-10-19 NOTE — Progress Notes (Signed)
Agree with PT.    [address] Ermal Brzozowski, [place] [phone]

## 2011-10-19 NOTE — Progress Notes (Addendum)
Physical Therapy Treatment Patient Details Name: [name] MRN: [id number] DOB: 1940-07-16 Today's Date: 10/19/2011 Time: 9147-8295 PT Time Calculation (min): 20 min  PT Assessment / Plan / Recommendation Comments on Treatment Session  Pt very pleasant & willing to participate in therapy.      Follow Up Recommendations  Skilled nursing facility    Barriers to Discharge        Equipment Recommendations  Defer to next venue    Recommendations for Other Services    Frequency Min 2X/week   Plan Discharge plan remains appropriate;Frequency remains appropriate    Precautions / Restrictions Restrictions Weight Bearing Restrictions: No    Pertinent Vitals/Pain No pain reported.  SOB noted with activity.  Cues for pursed lip breathing.     Mobility  Bed Mobility Bed Mobility: Not assessed Transfers Transfers: Sit to Stand;Stand to Sit Sit to Stand: 4: Min guard;With upper extremity assist;With armrests;From chair/3-in-1;From toilet Stand to Sit: 4: Min guard;With upper extremity assist;With armrests;To chair/3-in-1;To toilet Details for Transfer Assistance: Cues for safe technique.  Pt attempting to pull up to standing with hand on RW.  Encouraged minor use of UE's if able.   Ambulation/Gait Ambulation/Gait Assistance: 4: Min guard Ambulation Distance (Feet): 400 Feet Assistive device: Rolling walker Ambulation/Gait Assistance Details: Cues for safe use of RW, body positioning inside RW, safe management of RW in narrow spaces.   Gait Pattern: Step-through pattern;Trunk flexed Stairs: No Wheelchair Mobility Wheelchair Mobility: No      PT Goals Acute Rehab PT Goals Time For Goal Achievement: 10/28/11 PT Goal: Sit to Stand - Progress: Progressing toward goal PT Goal: Stand to Sit - Progress: Progressing toward goal PT Goal: Ambulate - Progress: Progressing toward goal  Visit Information  Last PT Received On: 10/19/11 Assistance Needed: +1    Subjective Data      Cognition  Overall Cognitive Status: Appears within functional limits for tasks assessed/performed Arousal/Alertness: Awake/alert Orientation Level: Appears intact for tasks assessed Behavior During Session: [facility] for tasks performed    Balance     End of Session PT - End of Session Equipment Utilized During Treatment: Gait belt Activity Tolerance: Patient tolerated treatment well Patient left: in chair;with call bell/phone within reach    [place], [place] [phone] 10/19/2011

## 2011-10-19 NOTE — Progress Notes (Signed)
EPW d/c at this time per MD order; pt tolerated well; pt to be bedrest until 1110; pt made aware; will cont. To monitor.

## 2011-10-19 NOTE — Progress Notes (Signed)
Pt watching Coumadin video at this time; will cont. To monitor. 

## 2011-10-19 NOTE — Discharge Summary (Signed)
[address]            [place] [address]          [phone]      [name] [birth year] 71 y.o. [id number]  10/12/2011   Mindy Borne, MD  SEVERE AORTIC INSUFFICENCY SEVERE AORTIC INSUFFICENCY  HPI:  The patient is a 71 year old African American female with a history of hypertension who said that she was very active and feeling fine until April 2013 when she began developing progressively worsening dyspnea and generalized anasarca especially in her legs. She initially thought that she had arthritis developing because of stiffness in her legs. She had an echocardiogram done on 08/17/2011 that showed moderate aortic insufficiency with right ventricular dysfunction. She was started on diuretics due to worsening symptoms and she was admitted to the hospital for acute on chronic diastolic heart failure. She was treated with intravenous diuretics with slow improvement. She was noted to have a moderate to large left pleural effusion and underwent an ultrasound-guided thoracentesis showing this to be a transudate. She also had ascites and underwent paracentesis. Cultures were negative. She was discharged on diuretics and said that she has continued to improve. She no longer has shortness of breath. She said her weight went from 192 pounds down to 145 pounds with diuresis. A 2-D echocardiogram on 09/04/2011 showed left ventricular cavity size be normal. There was moderate concentric left hypertrophy. There was abnormal septal wall motion due to right ventricular volume overload. Left ventricular ejection fraction was estimated at 40% with global hypokinesis. There was grade 2 diastolic dysfunction. The left and right atrial cavities were severely dilated. The right ventricular cavity was moderately enlarged with severe global hypokinesis. There was moderate to severe aortic insufficiency but appeared worse than on the prior echocardiogram. There was mild to  moderate mitral regurgitation with a structurally normal mitral valve. There was mild to moderate tricuspid regurgitation with a structurally normal tricuspid valve. There was evidence of mild pulmonary hypertension. There was a large left pleural effusion. She subsequently underwent a left and right heart catheterization on 09/11/2011 that showed normal coronary arteries. There is severe aortic insufficiency with a normal sized ascending aorta. Left ventricular ejection fraction is 55-60%. PA pressure was 64/30 with a mean of 43. Pulmonary artery saturation is 48%. The pulmonary capillary wedge pressure was 32/34 with a mean of 30. Aortic saturation is 91%. Right ventricular pressure was 63/13 with a right ventricular end-diastolic pressure of 23. Right atrial pressure was 23/21 with a mean of 17 and a right atrial saturation of 55%. Cardiac output was 3.0 with an index of 2.07 by Fick. Cardiac output was 2.8 with an index of 1.4 by thermaodilution. She was referred to Evelene Croon M.D. in cardiothoracic surgical consultation. He evaluated the patient and her studies and she was admitted this hospitalization for aortic valve replacement and mitral valve repair.  Past Medical History   Diagnosis Date  . Hypertension  . GERD (gastroesophageal reflux disease)  History reviewed. No pertinent past surgical history.   Family History  Problem Relation Age of Onset  . Hypertension Mother   Social History  History  Substance Use Topics  . Smoking status: Never Smoker  . Smokeless tobacco: Not on file  . Alcohol Use: No   Current Outpatient Prescriptions  Medication Sig Dispense Refill  . aspirin 81 MG tablet Take 81 mg  by mouth daily.  . furosemide (LASIX) 40 MG tablet Take 40 mg by mouth daily.  Marland Kitchen losartan (COZAAR) 50 MG tablet Take 50 mg by mouth 2 (two) times daily.  . metoprolol tartrate (LOPRESSOR) 25 MG tablet Take 25 mg by mouth 2 (two) times daily. 12.5 mg po BID (1/2 tab of 25 mg)  .  potassium chloride SA (K-DUR,KLOR-CON) 20 MEQ tablet Take 20 mEq by mouth daily.  Marland Kitchen spironolactone (ALDACTONE) 25 MG tablet Take 25 mg by mouth daily.   No Known Allergies   Review of Systems : At time of consultation Constitutional: Positive for activity change, fatigue and unexpected weight change. Negative for fever, chills, diaphoresis and appetite change.  40 lb wt gain over 1 month prior to presentation. Has subsequently lost 52 lbs with diuresis.  HENT: Negative.  Eyes: Negative.  Respiratory: Positive for shortness of breath.  Cardiovascular: Positive for leg swelling.  Gastrointestinal: Positive for abdominal pain and abdominal distention. Negative for nausea, vomiting, diarrhea, constipation and blood in stool.  Genitourinary: Negative.  Musculoskeletal: Negative.  Skin: Negative.  Neurological: Negative.  Hematological: Negative.  Psychiatric/Behavioral: Negative.   BP 121/57  Pulse 52  Resp 18  Ht 5\' 4"  (1.626 m)  Wt 162 lb (73.483 kg)  BMI 27.81 kg/m2  SpO2 98%  Physical Exam : At time of consultation Constitutional: She is oriented to person, place, and time. She appears well-developed and well-nourished. No distress.  HENT:  Head: Normocephalic and atraumatic.  Mouth/Throat: Oropharynx is clear and moist. No oropharyngeal exudate.  Eyes: Conjunctivae and EOM are normal. Pupils are equal, round, and reactive to light.  Neck: Normal range of motion. Neck supple. No JVD present. No tracheal deviation present. No thyromegaly present.  Cardiovascular: Normal rate and regular rhythm.  Murmur heard.  III/IV diastolic AI murmur heard throughout precordium.  Pulmonary/Chest: Effort normal. No respiratory distress. She has no rales.  Diminished breath sounds at left base.  Abdominal: Soft. Bowel sounds are normal. She exhibits no distension and no mass. There is no tenderness.  Musculoskeletal: She exhibits edema.  Marked brawny edema to knees bilat.  Lymphadenopathy:    She has no cervical adenopathy.  Neurological: She is alert and oriented to person, place, and time. She has normal strength. No cranial nerve deficit or sensory deficit.  Skin: Skin is warm and dry.  Diffuse pigmented skin lesions throughout trunk  Psychiatric: She has a normal mood and affect.    Hospital Course : The patient was admitted and on 10/12/2011 she was taken the operating room at which time she underwent the following procedure:  OPERATIVE REPORT  PREOPERATIVE DIAGNOSIS: Severe aortic insufficiency and moderate mitral  insufficiency.  POSTOPERATIVE DIAGNOSIS: Severe aortic insufficiency and moderate  mitral insufficiency.  OPERATIVE PROCEDURE: Median sternotomy, extracorporeal circulation,  mitral annuloplasty with a 28-mm Sorin 3D Memo annuloplasty ring, aortic  valve replacement using a 21-mm Edwards pericardial Magna-Ease valve.  SURGEON: Evelene Croon, M.D.  ASSISTANT: Orlie Dakin, RNFA.  ANESTHESIA: General endotracheal.  The patient remained hemodynamically stable and was transferred to the SICU in guarded, but stable condition.  Postoperative hospital course: The patient initially did have some bleeding that required return to the operating room for reexploration. She remained hemodynamically stable. There was a large amount of clotted and fresh blood noted within the mediastinum as well as within the right pleural space. After complete inspection there was no specific bleeding site identified. There was some oozing from the edge of the sternum and the bone  and this was controlled with Gelfoam and thrombin as well as electrocautery. She was closed and returned to the SICU in stable condition.  Following returned to the SICU the patient has continued to make steady progress. She was weaned from the ventilator without difficulty. She has maintained stable hemodynamics but has had postoperative atrial fibrillation/flutter. She has been started on amiodarone as well as beta  blocker and Coumadin for anticoagulation therapy. She currently maintains a atrial fibrillation/flutter with controlled ventricular response. She has also had some postoperative hypertension which is steadily improving with resumption of her Cozaar. She has had some postoperative volume overload which is showing a steady response to diuretics which will continue at discharge. All routine lines, monitors and drainage devices have been discontinued in the standard fashion. She has mixed acted acute blood loss anemia which has stabilized. She is tolerating gradually increasing activities using standard protocols. She has had some postoperative nausea which is improved. Incisions are healing well without evidence of infection. Oxygen has been weaned and she maintains good saturations on room air. He is felt that she will require further rehabilitation in a skilled nursing facility setting for a short term. Tentatively she is felt to be stable for transfer in the next 24-48 hours pending ongoing reevaluation of her recovery.      :   Basename 10/18/11 0600  NA 136  K 4.1  CL 100  CO2 30  GLUCOSE 108*  BUN 18  CALCIUM 9.1    Basename 10/18/11 0600  WBC 8.1  HGB 10.1*  HCT 31.3*  PLT 152    Basename 10/19/11 0510 10/18/11 0600  INR 1.10 1.14     Discharge Instructions:  The patient is discharged to nursing home with extensive instructions on wound care and progressive ambulation.  They are instructed not to drive or perform any heavy lifting until returning to see the physician in his office. She is to continue rehabilitation/physical therapy and a skilled facility.  Discharge Diagnosis:  SEVERE AORTIC INSUFFICENCY SEVERE AORTIC INSUFFICENCY Postoperative atrial fibrillation/flutter Expected acute blood loss anemia   Secondary Diagnosis: Patient Active Problem List  Diagnosis  . Diastolic CHF, acute on chronic  . SOB (shortness of breath)  . Pleural effusion, bilateral  .  Hypertension  . Hypoxia  . Anasarca  . Hypokalemia  . Aortic regurgitation   Past Medical History  Diagnosis Date  . Hypertension   . CHF (congestive heart failure)   . Aortic stenosis        Johneisha, [place]  Home Medication Instructions [id number]   Printed on:10/19/11 1254  Medication Information                    aspirin 81 MG tablet Take 81 mg by mouth daily.           furosemide (LASIX) 40 MG tablet Take 40 mg by mouth daily.            losartan (COZAAR) 50 MG tablet Take 50 mg by mouth 2 (two) times daily.            metoprolol tartrate (LOPRESSOR) 25 MG tablet Take 25 mg by mouth 2 (two) times daily. 12.5 mg po BID (1/2 tab of 25 mg)           spironolactone (ALDACTONE) 25 MG tablet Take 25 mg by mouth daily.           potassium chloride SA (K-DUR,KLOR-CON) 20 MEQ tablet Take 20 mEq by mouth daily.  amiodarone (PACERONE) 400 MG tablet Take 0.5 tablets (200 mg total) by mouth 2 (two) times daily.           oxyCODONE (OXY IR/ROXICODONE) 5 MG immediate release tablet Take 1-2 tablets (5-10 mg total) by mouth every 6 (six) hours as needed.           warfarin (COUMADIN) 5 MG tablet Take 1 tablet (5 mg total) by mouth daily. As directed based on PT/INR             Disposition: To skilled nursing facility  Patient's condition is Good  Gershon Crane, PA-C 10/19/2011  12:54 PM

## 2011-10-19 NOTE — Progress Notes (Addendum)
[address]            [place] [address]          [phone]     7 Days Post-Op  Procedure(s) (LRB): EXPLORATION POST OPERATIVE OPEN HEART (N/A) Subjective: Feels remarkably better today  Objective  Telemetry afib/flutter w/CVR  Temp:  [97.2 F (36.2 C)-98.9 F (37.2 C)] 97.2 F (36.2 C) (08/29 0454) Pulse Rate:  [66-91] 66  (08/29 0454) Resp:  [18] 18  (08/29 0454) BP: (152-179)/(72-121) 179/94 mmHg (08/29 0454) SpO2:  [95 %-100 %] 95 % (08/29 0454) Weight:  [157 lb 3 oz (71.3 kg)] 157 lb 3 oz (71.3 kg) (08/29 0454)   Intake/Output Summary (Last 24 hours) at 10/19/11 0746 Last data filed at 10/19/11 0233  Gross per 24 hour  Intake    720 ml  Output   1250 ml  Net   -530 ml       General appearance: alert, cooperative and no distress Heart: irregularly irregular rhythm and S1, S2 normal Lungs: min dim in bases Abdomen: soft, nontender Extremities: improved edema Wound: incisions healing well  Lab Results:  Basename 10/18/11 0600  NA 136  K 4.1  CL 100  CO2 30  GLUCOSE 108*  BUN 18  CREATININE 0.65  CALCIUM 9.1  MG --  PHOS --   No results found for this basename: AST:2,ALT:2,ALKPHOS:2,BILITOT:2,PROT:2,ALBUMIN:2 in the last 72 hours No results found for this basename: LIPASE:2,AMYLASE:2 in the last 72 hours  Basename 10/18/11 0600  WBC 8.1  NEUTROABS --  HGB 10.1*  HCT 31.3*  MCV 91.0  PLT 152   No results found for this basename: CKTOTAL:4,CKMB:4,TROPONINI:4 in the last 72 hours No components found with this basename: POCBNP:3 No results found for this basename: DDIMER in the last 72 hours No results found for this basename: HGBA1C in the last 72 hours No results found for this basename: CHOL,HDL,LDLCALC,TRIG,CHOLHDL in the last 72 hours No results found for this basename: TSH,T4TOTAL,FREET3,T3FREE,THYROIDAB in the last 72 hours No results found for this basename:  VITAMINB12,FOLATE,FERRITIN,TIBC,IRON,RETICCTPCT in the last 72 hours  Medications: Scheduled    . amiodarone  400 mg Oral BID  . aspirin  81 mg Oral Daily  . bisacodyl  10 mg Oral Daily   Or  . bisacodyl  10 mg Rectal Daily  . feeding supplement  237 mL Oral Q1500  . furosemide  40 mg Oral BID  . insulin aspart  0-24 Units Subcutaneous TID AC & HS  . losartan  50 mg Oral BID  . metoprolol tartrate  12.5 mg Oral BID  . pantoprazole  40 mg Oral Q1200  . patient's guide to using coumadin book   Does not apply Once  . potassium chloride  20 mEq Oral BID  . spironolactone  25 mg Oral Daily  . warfarin  5 mg Oral ONCE-1800  . warfarin   Does not apply Once  . Warfarin - Physician Dosing Inpatient   Does not apply q1800  . DISCONTD: losartan  50 mg Oral Daily     Radiology/Studies:  No results found.  INR:1.10 Will add last result for INR, ABG once components are confirmed Will add last 4 CBG results once components are confirmed  Assessment/Plan: S/P Procedure(s) (LRB): EXPLORATION POST OPERATIVE OPEN HEART (N/A)  1. Good over all improvement 2 clinically diuresing well, will decrease lasix to daily 3  INR not rising- will increase coumadin further 5 cont current rx for afib/flutter- d/c epw's 6 BP increased- cozaar increased yesterday , may need additional agent 7 to SNF soon  LOS: 7 days    [name],Mindy E 8/29/20137:46 AM     Chart reviewed, patient examined, agree with above. She received some vitamin K postop when she was bleeding so it may take a little longer for her INR to rise. Will repeat her cxr in am to followup on the effusions on last cxr.

## 2011-10-20 ENCOUNTER — Inpatient Hospital Stay (HOSPITAL_COMMUNITY): Payer: Medicare Other

## 2011-10-20 LAB — BASIC METABOLIC PANEL
GFR calc non Af Amer: 85 mL/min — ABNORMAL LOW (ref >90)
Glucose, Bld: 110 mg/dL — ABNORMAL HIGH (ref 70–99)
Potassium: 4.1 mEq/L (ref 3.5–5.1)
Sodium: 138 mEq/L (ref 135–145)

## 2011-10-20 LAB — CBC
Hemoglobin: 10.8 g/dL — ABNORMAL LOW (ref 12.0–15.0)
MCHC: 33.1 g/dL (ref 30.0–36.0)
Platelets: 195 10*3/uL (ref 150–400)
RBC: 3.51 MIL/uL — ABNORMAL LOW (ref 3.87–5.11)

## 2011-10-20 LAB — PROTIME-INR
INR: 1.44 (ref 0.00–1.49)
Prothrombin Time: 17.8 seconds — ABNORMAL HIGH (ref 11.6–15.2)

## 2011-10-20 MED ORDER — WARFARIN SODIUM 7.5 MG PO TABS
7.5000 mg | ORAL_TABLET | Freq: Once | ORAL | Status: DC
  Filled 2011-10-20: qty 1

## 2011-10-20 MED ORDER — LISINOPRIL 10 MG PO TABS
10.0000 mg | ORAL_TABLET | Freq: Every day | ORAL | Status: DC
  Administered 2011-10-20: 10 mg via ORAL
  Filled 2011-10-20: qty 1

## 2011-10-20 MED ORDER — LISINOPRIL 10 MG PO TABS: 10.0000 mg | ORAL_TABLET | Freq: Every day | ORAL | Status: DC

## 2011-10-20 NOTE — Progress Notes (Signed)
Patient transported via ambulance to [facility] skilled nursing facility. IV d/c'd with catheter intact. D/c instructions along with medlist provided to patient . [name]

## 2011-10-20 NOTE — Progress Notes (Signed)
Ed completed with pt. Requests her name be sent to [facility]. Will need assist with transport.  [phone] [name] CES, ACSM

## 2011-10-20 NOTE — Progress Notes (Signed)
CSW spoke with pt concerning d/c.  Pt and SNF are both ready for pt d/c.  CSW noticed that FL2 had not been signed.  CSW paged MD to sign FL2 to facilitate d/c.  CSW also l/m for PA to proceed with d/c summary.  Per SNF, pt will need to be admitted (at facility) by 4:00.   Vickii Penna, LCSWA [phone]  Clinical Social Work

## 2011-10-20 NOTE — Progress Notes (Signed)
8 Days Post-Op Procedure(s) (LRB): EXPLORATION POST OPERATIVE OPEN HEART (N/A)  Subjective: Ms. Wheelwright has no new complaints this morning.  She states she has come such a long way. + Ambulation, BM  Objective: Vital signs in last 24 hours: Temp:  [97.5 F (36.4 C)-97.9 F (36.6 C)] 97.5 F (36.4 C) (08/30 0439) Pulse Rate:  [58-86] 58  (08/30 0439) Cardiac Rhythm:  [-] Atrial flutter;Other (Comment) (08/29 2000) BP: (126-185)/(77-118) 157/94 mmHg (08/30 0439) SpO2:  [96 %-97 %] 96 % (08/30 0439) Weight:  [156 lb 4.9 oz (70.9 kg)] 156 lb 4.9 oz (70.9 kg) (08/30 0439)  Intake/Output from previous day: 08/29 0701 - 08/30 0700 In: 1080 [P.O.:1080] Out: 2050 [Urine:2050] Intake/Output this shift: Total I/O In: 480 [P.O.:480] Out: 850 [Urine:850]  General appearance: alert, cooperative and no distress Neurologic: intact Heart: irregularly irregular rhythm Lungs: diminished breath sounds bibasilar Abdomen: soft, non-tender; bowel sounds normal; no masses,  no organomegaly Extremities: edema trace Wound: clean and dry  Lab Results:  Basename 10/20/11 0545 10/18/11 0600  WBC 10.5 8.1  HGB 10.8* 10.1*  HCT 32.6* 31.3*  PLT 195 152   BMET:  Basename 10/20/11 0545 10/18/11 0600  NA 138 136  K 4.1 4.1  CL 100 100  CO2 29 30  GLUCOSE 110* 108*  BUN 17 18  CREATININE 0.70 0.65  CALCIUM 9.3 9.1    PT/INR:  Basename 10/20/11 0545  LABPROT 17.8*  INR 1.44   ABG    Component Value Date/Time   PHART 7.372 10/13/2011 0643   HCO3 28.1* 10/13/2011 0643   TCO2 26 10/13/2011 1714   ACIDBASEDEF 0.0 10/10/2011 1507   O2SAT 100.0 10/13/2011 0643   CBG (last 3)   Basename 10/19/11 0556 10/18/11 2145 10/18/11 1632  GLUCAP 100* 111* 96    Assessment/Plan: S/P Procedure(s) (LRB): EXPLORATION POST OPERATIVE OPEN HEART (N/A)  1. CV- Afib/Flutter- on Amiodarone, Lopressor, Cozaar, remains hypertensive will not tolerate increase in Lopressor, will add Lisinopril 10mg  daily 2.  INR 1.41- on coumadin 7.5mg  daily 3. Pulm- bilateral effusions/atelectasis- continue diuresis, IS 4. Deconditioning- patient needs SNF 5. Dispo- patient is progressing well, her INR is trending upward and her blood pressure is under better control and we will add Lisinopril today.  She is medically stable, will discuss discharge timing with Dr. Laneta Simmers   LOS: 8 days    Lowella Dandy 10/20/2011

## 2011-10-20 NOTE — Progress Notes (Signed)
CSW called EMS for pickup for pt to d/c to SNF- [facility].   Vickii Penna, LCSWA [phone]  Clinical Social Work

## 2011-11-03 ENCOUNTER — Other Ambulatory Visit: Payer: Self-pay | Admitting: Surgery

## 2011-11-03 DIAGNOSIS — I059 Rheumatic mitral valve disease, unspecified: Secondary | ICD-10-CM

## 2011-11-07 ENCOUNTER — Encounter: Payer: Self-pay | Admitting: Surgery

## 2011-11-07 ENCOUNTER — Ambulatory Visit (INDEPENDENT_AMBULATORY_CARE_PROVIDER_SITE_OTHER): Payer: Self-pay | Admitting: Surgery

## 2011-11-07 ENCOUNTER — Ambulatory Visit
Admission: RE | Admit: 2011-11-07 | Discharge: 2011-11-07 | Disposition: A | Discharge status: Home / Self Care | Payer: Medicare Other | Source: Ambulatory Visit | Attending: Surgery | Admitting: Surgery

## 2011-11-07 VITALS — BP 116/74 | HR 91 | Resp 16 | Ht 64.0 in | Wt 152.0 lb

## 2011-11-07 DIAGNOSIS — I351 Nonrheumatic aortic (valve) insufficiency: Secondary | ICD-10-CM

## 2011-11-07 DIAGNOSIS — I359 Nonrheumatic aortic valve disorder, unspecified: Secondary | ICD-10-CM

## 2011-11-07 DIAGNOSIS — Z09 Encounter for follow-up examination after completed treatment for conditions other than malignant neoplasm: Secondary | ICD-10-CM

## 2011-11-07 DIAGNOSIS — I059 Rheumatic mitral valve disease, unspecified: Secondary | ICD-10-CM

## 2011-11-07 DIAGNOSIS — I340 Nonrheumatic mitral (valve) insufficiency: Secondary | ICD-10-CM

## 2011-11-07 NOTE — Progress Notes (Signed)
[address]            [name] [address]          [phone]     HPI:  Patient returns for routine postoperative follow-up having undergone mitral annuloplasty and aortic valve replacement with a 21 mm pericardial valve on 10/12/2011. The patient's early postoperative recovery while in the hospital was notable for postoperative atrial fibrillation that was controlled with amiodarone. She was discharged on Coumadin. Since hospital discharge the patient reports she has been at [facility]. She is making slow but steady progress and feels that she is making a good recovery. She is ambulatory although she has some difficulty due to stiffness in her legs and ankles from chronic edema. She denies any shortness of breath or chest pain.   Current Outpatient Prescriptions  Medication Sig Dispense Refill  . amiodarone (PACERONE) 400 MG tablet Take 0.5 tablets (200 mg total) by mouth 2 (two) times daily.  60 tablet  1  . aspirin 81 MG tablet Take 81 mg by mouth daily.      . furosemide (LASIX) 40 MG tablet Take 80 mg by mouth daily.       Marland Kitchen lisinopril (PRINIVIL,ZESTRIL) 10 MG tablet Take 1 tablet (10 mg total) by mouth daily.      Marland Kitchen losartan (COZAAR) 50 MG tablet Take 50 mg by mouth 2 (two) times daily.       . metoprolol tartrate (LOPRESSOR) 25 MG tablet Take 25 mg by mouth 2 (two) times daily. 12.5 mg po BID (1/2 tab of 25 mg)      . potassium chloride SA (K-DUR,KLOR-CON) 20 MEQ tablet Take 20 mEq by mouth daily.       Marland Kitchen spironolactone (ALDACTONE) 25 MG tablet Take 25 mg by mouth daily.      Marland Kitchen warfarin (COUMADIN) 5 MG tablet Take 1.5 mg by mouth daily. As directed based on PT/INR      . DISCONTD: warfarin (COUMADIN) 5 MG tablet Take 1 tablet (5 mg total) by mouth daily. As directed based on PT/INR  100 tablet  1    Physical Exam: BP 116/74  Pulse 91  Resp 16  Ht 5\' 4"  (1.626 m)  Wt 152 lb (68.947 kg)  BMI 26.09 kg/m2  SpO2  96% She looks well. Cardiac exam shows a regular rate and rhythm with normal prosthetic valve sounds. There is no murmur. Lung exam reveals decreased breath sounds at the right base. The chest incision is healing well and sternum is stable. There is moderate bilateral lower extremity edema to the thigh level.  Diagnostic Tests:  *RADIOLOGY REPORT*   Clinical Data: Mitral valve repair August 2013.  Swelling in legs.   CHEST - 2 VIEW   Comparison: Two-view chest 10/20/2011.   Findings: Cardiac enlargement is stable.  The patient is status post median sternotomy for aortic and mitral valve repair.  A right pleural effusion and airspace disease is stable to slightly increased.  There is some decrease in the left pleural effusion and airspace disease.  The upper lung fields are clear.  The visualized soft tissues and bony thorax are unremarkable.   IMPRESSION:   1.  Status post median sternotomy for valve appears. 2.  Stable slight increase and a right pleural effusion and associated airspace disease. 3.  Slight decrease in the left pleural effusion and  airspace disease. 4.  Stable cardiomegaly.     Original Report Authenticated By: Jamesetta Orleans. MATTERN, M.D.   Impression:  She is making slow but steady progress following mitral annuloplasty and aortic valve replacement. She had long-standing biventricular dysfunction and congestive heart failure with severe pulmonary hypertension. She had a nice reduction in her pulmonary artery pressures postoperatively but I would expect her to require continued medical management of her congestive heart failure. She still has significant total body volume overload but said that her weight is steadily decreasing on Lasix 80 mg daily. She does have a significant right pleural effusion on chest x-ray which looks a little worse than on her prior chest x-ray. I will plan to see her back in about 3 weeks and we'll repeat her chest x-ray at that time  to decide whether thoracentesis is warranted. Since she is on Coumadin for atrial fibrillation, that would have to be discontinued for 4 days prior to thoracentesis. Hopefully we can avoid that. I think she will benefit from continued physical therapy that will allow her to transition to going home.  Plan:  We made an appointment for her to see Dr. Jacinto Halim in followup since she has not seen him since surgery and did not have an appointment. I will plan to see her back in 3 weeks with a repeat chest x-ray.

## 2011-11-27 ENCOUNTER — Other Ambulatory Visit: Payer: Self-pay | Admitting: Surgery

## 2011-11-27 DIAGNOSIS — Z952 Presence of prosthetic heart valve: Secondary | ICD-10-CM

## 2011-11-27 DIAGNOSIS — Z9889 Other specified postprocedural states: Secondary | ICD-10-CM

## 2011-11-28 ENCOUNTER — Ambulatory Visit
Admission: RE | Admit: 2011-11-28 | Discharge: 2011-11-28 | Disposition: A | Discharge status: Home / Self Care | Payer: Medicare Other | Source: Ambulatory Visit | Attending: Surgery | Admitting: Surgery

## 2011-11-28 ENCOUNTER — Encounter: Payer: Self-pay | Admitting: Surgery

## 2011-11-28 ENCOUNTER — Ambulatory Visit (INDEPENDENT_AMBULATORY_CARE_PROVIDER_SITE_OTHER): Payer: Self-pay | Admitting: Surgery

## 2011-11-28 ENCOUNTER — Ambulatory Visit: Payer: Self-pay | Admitting: Surgery

## 2011-11-28 VITALS — BP 117/74 | HR 49 | Resp 18 | Ht 64.0 in | Wt 135.0 lb

## 2011-11-28 DIAGNOSIS — Z952 Presence of prosthetic heart valve: Secondary | ICD-10-CM

## 2011-11-28 DIAGNOSIS — I059 Rheumatic mitral valve disease, unspecified: Secondary | ICD-10-CM

## 2011-11-28 DIAGNOSIS — Z9889 Other specified postprocedural states: Secondary | ICD-10-CM

## 2011-11-28 DIAGNOSIS — I359 Nonrheumatic aortic valve disorder, unspecified: Secondary | ICD-10-CM

## 2011-11-28 NOTE — Patient Instructions (Addendum)
No lifting more than 10 lbs for 3 months postoperatively  Followup with Dr. Jacinto Halim.

## 2011-11-28 NOTE — Progress Notes (Signed)
                   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 The patient's early postoperative recovery while in the hospital was notable for postoperative atrial fibrillation that was controlled with amiodarone. She was discharged on Coumadin. I saw her a few weeks ago and chest x-ray showed significant right pleural effusion and right lower lobe atelectasis. Since then her breathing is continuing to improve. She said that her peripheral edema continues to improve. She denies any chest pain or shortness of breath. She saw Dr. Jacinto Halim recently and was scheduled for an echocardiogram and cardioversion in a few weeks.    Current Outpatient Prescriptions  Medication Sig Dispense Refill  . amiodarone (PACERONE) 400 MG tablet Take 0.5 tablets (200 mg total) by mouth 2 (two) times daily.  60 tablet  1  . aspirin 81 MG tablet Take 81 mg by mouth daily.      . furosemide (LASIX) 40 MG tablet Take 80 mg by mouth daily.       Marland Kitchen lisinopril (PRINIVIL,ZESTRIL) 10 MG tablet Take 1 tablet (10 mg total) by mouth daily.      Marland Kitchen losartan (COZAAR) 50 MG tablet Take 50 mg by mouth 2 (two) times daily.       . metoprolol tartrate (LOPRESSOR) 25 MG tablet Take 25 mg by mouth 2 (two) times daily. 12.5 mg po BID (1/2 tab of 25 mg)      . potassium chloride SA (K-DUR,KLOR-CON) 20 MEQ tablet Take 20 mEq by mouth daily.       Marland Kitchen spironolactone (ALDACTONE) 25 MG tablet Take 25 mg by mouth daily.      Marland Kitchen warfarin (COUMADIN) 5 MG tablet Take 1.5 mg by mouth daily. As directed based on PT/INR         Physical Exam: BP 117/74  Pulse 49  Resp 18  Ht 5\' 4"  (1.626 m)  Wt 135 lb (61.236 kg)  BMI 23.17 kg/m2  SpO2 99% She looks well. Cardiac exam shows an irregular rate and rhythm. Prosthetic valve sounds are  normal. Lungs sound clear. Incision is healing well and sternum is stable. There is mild bilateral lower extremity edema which has decreased since the last time I saw her.  Diagnostic Tests:  Chest x-ray today shows near complete resolution of the right pleural effusion and right lower lobe atelectasis.  Impression/Plan:  She is making good progress following aortic replacement and mitral annuloplasty. She has remained in atrial fibrillation and will undergo echocardiogram and cardioversion in a few weeks by Dr. Jacinto Halim. She will return to see me if she develops any problems with her incisions.

## 2011-12-05 ENCOUNTER — Ambulatory Visit: Payer: Self-pay | Admitting: Surgery

## 2011-12-07 ENCOUNTER — Encounter: Payer: Medicare Other | Admitting: Family Medicine

## 2011-12-07 NOTE — Progress Notes (Signed)
PATIENT CANCELLED APPT. BELOW IS SUMMARY ON BRIEF REVIEW OF CHART> [name] has a history of valvular heart disease, CHF, HTN and atrial fib managed by her cardiologist. She recently underwent mitral annuloplasty and aortic valve replacement on 10/12/2011. She now is on coumadin and is followed by cardiology. She has an echo and cardioversion scheduled with Dr. Jacinto Halim in November 2013.     This encounter was created in error - please disregard.

## 2011-12-26 ENCOUNTER — Encounter (HOSPITAL_COMMUNITY): Payer: Self-pay | Admitting: *Deleted

## 2011-12-26 ENCOUNTER — Ambulatory Visit (HOSPITAL_COMMUNITY)
Admission: RE | Admit: 2011-12-26 | Discharge: 2011-12-26 | Disposition: A | Discharge status: Home / Self Care | Payer: Medicare Other | Source: Ambulatory Visit | Attending: Cardiology | Admitting: Cardiology

## 2011-12-26 ENCOUNTER — Encounter (HOSPITAL_COMMUNITY)
Admission: RE | Disposition: A | Discharge status: Home / Self Care | Payer: Self-pay | Source: Ambulatory Visit | Attending: Cardiology

## 2011-12-26 ENCOUNTER — Ambulatory Visit (HOSPITAL_COMMUNITY): Payer: Medicare Other | Admitting: *Deleted

## 2011-12-26 DIAGNOSIS — I5042 Chronic combined systolic (congestive) and diastolic (congestive) heart failure: Secondary | ICD-10-CM | POA: Insufficient documentation

## 2011-12-26 DIAGNOSIS — I10 Essential (primary) hypertension: Secondary | ICD-10-CM | POA: Insufficient documentation

## 2011-12-26 DIAGNOSIS — Z952 Presence of prosthetic heart valve: Secondary | ICD-10-CM | POA: Insufficient documentation

## 2011-12-26 DIAGNOSIS — I4891 Unspecified atrial fibrillation: Secondary | ICD-10-CM | POA: Insufficient documentation

## 2011-12-26 DIAGNOSIS — I059 Rheumatic mitral valve disease, unspecified: Secondary | ICD-10-CM | POA: Insufficient documentation

## 2011-12-26 HISTORY — DX: Shortness of breath: R06.02

## 2011-12-26 HISTORY — PX: CARDIOVERSION: SHX1299

## 2011-12-26 SURGERY — CARDIOVERSION
Anesthesia: Monitor Anesthesia Care

## 2011-12-26 MED ORDER — HYDROCORTISONE 1 % EX CREA: 1.0000 "application " | TOPICAL_CREAM | Freq: Three times a day (TID) | CUTANEOUS | Status: DC | PRN

## 2011-12-26 MED ORDER — SODIUM CHLORIDE 0.9 % IV SOLN
INTRAVENOUS | Status: DC
  Administered 2011-12-26: 500 mL via INTRAVENOUS

## 2011-12-26 MED ORDER — PROPOFOL 10 MG/ML IV BOLUS
INTRAVENOUS | Status: DC | PRN
  Administered 2011-12-26: 100 mg via INTRAVENOUS

## 2011-12-26 NOTE — Interval H&P Note (Signed)
History and Physical Interval Note:  12/26/2011 1:22 PM  [name]  has presented today for surgery, with the diagnosis of a-fib  The various methods of treatment have been discussed with the patient and family. After consideration of risks, benefits and other options for treatment, the patient has consented to  Procedure(s) (LRB) with comments: CARDIOVERSION (N/A) as a surgical intervention .  The patient's history has been reviewed, patient examined, no change in status, stable for surgery.  I have reviewed the patient's chart and labs.  Questions were answered to the patient's satisfaction.     Pamella Pert

## 2011-12-26 NOTE — Anesthesia Postprocedure Evaluation (Signed)
Anesthesia Post Note  Patient: [name]  Procedure(s) Performed: Procedure(s) (LRB): CARDIOVERSION (N/A)  Anesthesia type: general  Patient location: PACU  Post pain: Pain level controlled  Post assessment: Patient's Cardiovascular Status Stable  Last Vitals:  Filed Vitals:   12/26/11 1252  BP: 120/94  Temp: 36.4 C  Resp: 17    Post vital signs: Reviewed and stable  Level of consciousness: sedated  Complications: No apparent anesthesia complications

## 2011-12-26 NOTE — Transfer of Care (Signed)
Immediate Anesthesia Transfer of Care Note  Patient: [name]  Procedure(s) Performed: Procedure(s) (LRB) with comments: CARDIOVERSION (N/A)  Patient Location: PACU  Anesthesia Type:MAC  Level of Consciousness: awake and alert   Airway & Oxygen Therapy: Patient Spontanous Breathing and Patient connected to nasal cannula oxygen  Post-op Assessment: Report given to PACU RN and Post -op Vital signs reviewed and stable  Post vital signs: Reviewed and stable  Complications: No apparent anesthesia complications

## 2011-12-26 NOTE — Anesthesia Preprocedure Evaluation (Addendum)
Anesthesia Evaluation  Patient identified by MRN, date of birth, ID band Patient awake    Reviewed: Allergy & Precautions, H&P , NPO status , Patient's Chart, lab work & pertinent test results, reviewed documented beta blocker date and time   Airway Mallampati: II TM Distance: >3 FB Neck ROM: Full    Dental  (+) Partial Lower, Partial Upper and Dental Advisory Given   Pulmonary shortness of breath,          Cardiovascular hypertension, Pt. on medications + CABG and +CHF + dysrhythmias Atrial Fibrillation     Neuro/Psych    GI/Hepatic   Endo/Other    Renal/GU      Musculoskeletal   Abdominal   Peds  Hematology   Anesthesia Other Findings   Reproductive/Obstetrics                           Anesthesia Physical Anesthesia Plan  ASA: III  Anesthesia Plan: MAC   Post-op Pain Management:    Induction: Intravenous  Airway Management Planned: Mask  Additional Equipment:   Intra-op Plan:   Post-operative Plan:   Informed Consent: I have reviewed the patients History and Physical, chart, labs and discussed the procedure including the risks, benefits and alternatives for the proposed anesthesia with the patient or authorized representative who has indicated his/her understanding and acceptance.   Dental advisory given  Plan Discussed with: CRNA, Anesthesiologist and Surgeon  Anesthesia Plan Comments: (See surgeon's H&P )       Anesthesia Quick Evaluation

## 2011-12-26 NOTE — H&P (Signed)
  Please see paper chart  

## 2011-12-26 NOTE — CV Procedure (Signed)
Direct current cardioversion:  Indication new post operative A. Fibrillation after AV replacement and MV repair 10/14/11.  Procedure: Using 100 mg of IV Propofol for achieving deep (Moderate sedation), synchronized direct current cardioversion performed. Patient was delivered with 50 Joules of electricity X 1 with success from A. Flutter to NSR. Patient tolerated the procedure well. No immediate complication noted.

## 2011-12-26 NOTE — OR Nursing (Signed)
Anesthesia administered propofol 100 mg iv.  Pads placed and MD cardioverted at 50 joules times 1 and patient converted to sinus bradycardia at a rate of 43.  Pt tolerated well.

## 2011-12-27 ENCOUNTER — Encounter (HOSPITAL_COMMUNITY): Payer: Self-pay | Admitting: Cardiology

## 2012-06-24 ENCOUNTER — Encounter (HOSPITAL_COMMUNITY): Payer: Self-pay | Admitting: Physician Assistant

## 2012-06-24 ENCOUNTER — Observation Stay (HOSPITAL_COMMUNITY)
Admission: AD | Admit: 2012-06-24 | Discharge: 2012-06-25 | Disposition: A | Discharge status: Home / Self Care | Payer: Medicare Other | Source: Ambulatory Visit | Attending: Cardiology | Admitting: Cardiology

## 2012-06-24 DIAGNOSIS — I440 Atrioventricular block, first degree: Principal | ICD-10-CM | POA: Insufficient documentation

## 2012-06-24 DIAGNOSIS — I129 Hypertensive chronic kidney disease with stage 1 through stage 4 chronic kidney disease, or unspecified chronic kidney disease: Secondary | ICD-10-CM | POA: Insufficient documentation

## 2012-06-24 DIAGNOSIS — N189 Chronic kidney disease, unspecified: Secondary | ICD-10-CM | POA: Insufficient documentation

## 2012-06-24 DIAGNOSIS — Z954 Presence of other heart-valve replacement: Secondary | ICD-10-CM | POA: Insufficient documentation

## 2012-06-24 DIAGNOSIS — I495 Sick sinus syndrome: Secondary | ICD-10-CM | POA: Insufficient documentation

## 2012-06-24 DIAGNOSIS — R001 Bradycardia, unspecified: Secondary | ICD-10-CM | POA: Diagnosis present

## 2012-06-24 DIAGNOSIS — I442 Atrioventricular block, complete: Secondary | ICD-10-CM | POA: Diagnosis present

## 2012-06-24 DIAGNOSIS — I5032 Chronic diastolic (congestive) heart failure: Secondary | ICD-10-CM | POA: Insufficient documentation

## 2012-06-24 LAB — COMPREHENSIVE METABOLIC PANEL
AST: 15 U/L (ref 0–37)
Albumin: 3.7 g/dL (ref 3.5–5.2)
Alkaline Phosphatase: 81 U/L (ref 39–117)
BUN: 34 mg/dL — ABNORMAL HIGH (ref 6–23)
CO2: 23 mEq/L (ref 19–32)
Chloride: 105 mEq/L (ref 96–112)
Creatinine, Ser: 1.49 mg/dL — ABNORMAL HIGH (ref 0.50–1.10)
GFR calc non Af Amer: 34 mL/min — ABNORMAL LOW (ref >90)
Potassium: 4.2 mEq/L (ref 3.5–5.1)
Total Bilirubin: 0.4 mg/dL (ref 0.3–1.2)

## 2012-06-24 LAB — CBC WITH DIFFERENTIAL/PLATELET
Basophils Absolute: 0 10*3/uL (ref 0.0–0.1)
Basophils Relative: 0 % (ref 0–1)
Eosinophils Absolute: 0.2 10*3/uL (ref 0.0–0.7)
Eosinophils Relative: 3 % (ref 0–5)
HCT: 34.5 % — ABNORMAL LOW (ref 36.0–46.0)
HCT: 35 % — ABNORMAL LOW (ref 36.0–46.0)
Hemoglobin: 12 g/dL (ref 12.0–15.0)
Hemoglobin: 12.3 g/dL (ref 12.0–15.0)
Lymphocytes Relative: 58 % — ABNORMAL HIGH (ref 12–46)
Lymphs Abs: 3.1 10*3/uL (ref 0.7–4.0)
MCH: 31.9 pg (ref 26.0–34.0)
MCHC: 34.8 g/dL (ref 30.0–36.0)
MCHC: 35.1 g/dL (ref 30.0–36.0)
MCV: 90.9 fL (ref 78.0–100.0)
Monocytes Absolute: 0.1 10*3/uL (ref 0.1–1.0)
Monocytes Absolute: 0.4 10*3/uL (ref 0.1–1.0)
Monocytes Relative: 2 % — ABNORMAL LOW (ref 3–12)
Monocytes Relative: 7 % (ref 3–12)
Neutro Abs: 1.9 10*3/uL (ref 1.7–7.7)
Neutrophils Relative %: 38 % — ABNORMAL LOW (ref 43–77)
Neutrophils Relative %: 34 % — ABNORMAL LOW (ref 43–77)
RBC: 3.85 MIL/uL — ABNORMAL LOW (ref 3.87–5.11)
WBC: 5.1 10*3/uL (ref 4.0–10.5)

## 2012-06-24 LAB — BASIC METABOLIC PANEL
BUN: 32 mg/dL — ABNORMAL HIGH (ref 6–23)
CO2: 21 mEq/L (ref 19–32)
Glucose, Bld: 97 mg/dL (ref 70–99)
Potassium: 4.1 mEq/L (ref 3.5–5.1)
Sodium: 137 mEq/L (ref 135–145)

## 2012-06-24 MED ORDER — SODIUM CHLORIDE 0.9 % IV SOLN: 250.0000 mL | INTRAVENOUS | Status: DC | PRN

## 2012-06-24 MED ORDER — ALUM & MAG HYDROXIDE-SIMETH 200-200-20 MG/5ML PO SUSP: 30.0000 mL | Freq: Four times a day (QID) | ORAL | Status: DC | PRN

## 2012-06-24 MED ORDER — BISACODYL 10 MG RE SUPP: 10.0000 mg | Freq: Every day | RECTAL | Status: DC | PRN

## 2012-06-24 MED ORDER — SODIUM CHLORIDE 0.9 % IJ SOLN: 3.0000 mL | INTRAMUSCULAR | Status: DC | PRN

## 2012-06-24 MED ORDER — SODIUM CHLORIDE 0.9 % IJ SOLN
3.0000 mL | Freq: Two times a day (BID) | INTRAMUSCULAR | Status: DC
  Administered 2012-06-24: 3 mL via INTRAVENOUS

## 2012-06-24 MED ORDER — DOCUSATE SODIUM 100 MG PO CAPS
100.0000 mg | ORAL_CAPSULE | Freq: Two times a day (BID) | ORAL | Status: DC
  Administered 2012-06-24: 100 mg via ORAL
  Filled 2012-06-24 (×3): qty 1

## 2012-06-24 MED ORDER — HEPARIN SODIUM (PORCINE) 5000 UNIT/ML IJ SOLN
5000.0000 [IU] | Freq: Three times a day (TID) | INTRAMUSCULAR | Status: DC
  Administered 2012-06-24 – 2012-06-25 (×2): 5000 [IU] via SUBCUTANEOUS
  Filled 2012-06-24 (×5): qty 1

## 2012-06-24 MED ORDER — ACETAMINOPHEN 650 MG RE SUPP: 650.0000 mg | Freq: Four times a day (QID) | RECTAL | Status: DC | PRN

## 2012-06-24 MED ORDER — ACETAMINOPHEN 325 MG PO TABS: 650.0000 mg | ORAL_TABLET | Freq: Four times a day (QID) | ORAL | Status: DC | PRN

## 2012-06-24 MED ORDER — ASPIRIN EC 325 MG PO TBEC
325.0000 mg | DELAYED_RELEASE_TABLET | Freq: Every day | ORAL | Status: DC
  Filled 2012-06-24: qty 1

## 2012-06-24 NOTE — Consult Note (Signed)
ELECTROPHYSIOLOGY CONSULT NOTE   Patient ID: [name] MRN: [id number] DOB/AGE: 73-Jan-1942 73 y.o.  Reason for Consult: bradycardia  Referring Physician: Dr Nash Shearer is an 72 y.o. female.   HPI: [name] is a 72 year old female with a history of aortic valve replacement and mitral valve repair in 2013. [name] has done very well since the surgery and exercises 7 days a week. Recently, [name] has been aware that her heart rate has not increased with exertion as much as as was usual for her. [name] denies feeling weak, dizziness, presyncope or syncope. [name] has not had chest pain or shortness of breath. [name] feels that her exertion level has been as usual. [name] saw Dr. Jacinto Halim today and was noted to be significantly bradycardic. [name] had been taking a low dose of metoprolol as recommended and this was discontinued. However, there is concern that [name] will need a pacemaker so electrophysiology was asked to evaluate her. Of note, her heart rate is dropping into the 30s at times during the conversation and [name] was completely asymptomatic with this.  PMH: Past Medical History  Diagnosis Date  . Hypertension   . CHF (congestive heart failure)   . Aortic stenosis   . Dysrhythmia   . Shortness of breath     hx dyspnea and respiratory abnormalities  . Atrial fibrillation     Status post cardioversion November 2013   PSHX: Past Surgical History  Procedure Laterality Date  . Cardiac catheterization    . Aortic valve replacement  10/12/2011    Procedure: AORTIC VALVE REPLACEMENT (AVR);  Surgeon: Alleen Borne, MD;  Location: [facility] OR;  Service: Open Heart Surgery;  Laterality: N/A;  . Mitral valve repair  10/12/2011    Procedure: MITRAL VALVE REPAIR (MVR);  Surgeon: Alleen Borne, MD;  Location[facility] OR;  Service: Open Heart Surgery;  Laterality: N/A;  mitral valve annuloplasty  . Exploration post operative open heart  10/12/2011    Procedure: EXPLORATION POST OPERATIVE OPEN HEART;   Surgeon: Alleen Borne, MD;  Location: [facility] OR;  Service: Open Heart Surgery;  Laterality: N/A;  . Cardioversion  12/26/2011    Procedure: CARDIOVERSION;  Surgeon: Pamella Pert, MD;  Location: [facility] ENDOSCOPY;  Service: Cardiovascular;  Laterality: N/A;    FAMHX: Family History  Problem Relation Age of Onset  . Hypertension Mother    Family Status  Relation Status Death Age  . Mother - currently 40 Alive     has heart murmur  . Father Deceased 78    MVA  . Sister Alive     x 3   Social History:  History   Social History  . Marital Status: Single    Spouse Name: N/A    Number of Children: N/A  . Years of Education: N/A   Occupational History  . retired   Social History Main Topics  . Smoking status: Never Smoker   . Smokeless tobacco: Not on file  . Alcohol Use: No  . Drug Use: No  . Sexually Active:    Other Topics Concern  . Not on file   Social History Narrative  . [name] lives alone    Allergies: No Known Allergies  PTA Medications Sig  aspirin 81 MG tablet Take 81 mg by mouth daily.  furosemide (LASIX) 40 MG tablet Take 80 mg by mouth daily.   lisinopril (PRINIVIL,ZESTRIL) 10 MG tablet Take 1 tablet (10 mg total) by mouth daily.  losartan (COZAAR)  50 MG tablet Take 50 mg by mouth 2 (two) times daily.   metoprolol tartrate (LOPRESSOR) 25 MG tablet Take 25 mg by mouth 2 (two) times daily. 12.5 mg po BID (1/2 tab of 25 mg)  potassium chloride SA (K-DUR,KLOR-CON) 20 MEQ tablet Take 20 mEq by mouth daily.   spironolactone (ALDACTONE) 25 MG tablet Take 25 mg by mouth daily.  warfarin (COUMADIN) 5 MG tablet Take 1.5 mg by mouth daily. As directed based on PT/INR    Medications: I have reviewed the patient's current medications  No results found.  ROS As stated in the HPI and negative for all other systems.  Physical Exam  Vitals:Blood pressure 92/56, temperature 96.7 F (35.9 C), temperature source Oral, resp. rate 18, SpO2 98.00%. Well appearing  female in NAD HEENT: Unremarkable Neck:  No JVD, no thyromegally Lymphatics:  No adenopathy Back:  No CVA tenderness Lungs:  Clearto auscultation bilaterally HEART:  Slightly irregular rate rhythm, 2-3/6 murmur, no rubs, no clicks Abd:  Flat, positive bowel sounds, no organomegally, no rebound, no guarding Ext:  2 plus pulses all 4 extremities, no edema, no cyanosis, no clubbing Skin:  No rashes no nodules;  All surgical scars are well-healed Neuro:  CN II through XII intact, motor grossly intact   Lab Results  Component Value Date   WBC 5.1 06/24/2012   HGB 12.0 06/24/2012   HCT 34.5* 06/24/2012   MCV 91.3 06/24/2012   PLT 103* 06/24/2012    Recent Labs  06/24/12 1632  INR 1.17    Recent Labs Lab 06/24/12 1632  NA 139  K 4.2  CL 105  CO2 23  BUN 34*  CREATININE 1.49*  CALCIUM 9.8  PROT 7.2  BILITOT 0.4  ALKPHOS 81  ALT 10  AST 15  GLUCOSE 84   ECG: 24-Jun-2012 17:17:45  Junctional bradycardia Left axis deviation Incomplete left bundle branch block Nonspecific T wave abnormality Vent. rate 38 BPM PR interval * ms QRS duration 114 ms QT/QTc 476/378 ms P-R-T axes * -45 89  On 12-lead rhythm strip and telemetry review, [name] has junctional bradycardia, occasional sinus beats, PVCs and trigeminy has been seen. Her HR has been consistently in the 40s.  Assessment/Plan: Principal Problem:   Junctional bradycardia - [name] was on a low dose of metoprolol at 12.5 mg twice a day and this has been discontinued. [name] has been tolerating the bradycardia well. We will continue to follow her on telemetry. [name] has had screening labs including a CBC, BMET, TSH (results planned) and an INR. Dr. Ladona Ridgel is to see and advise if permanent pacemaker is indicated.  Otherwise, per Dr. Jacinto Halim.    Theodore Demark, PA-C 06/24/2012 5:57 PM Beeper [phone]   EP Attending  Patient seen and examined. Agree with above except that [name] has NSR with CHB and a junctional escape. [name] is minimally  symptomatic. [name] has had her beta blocker held. I suspect her AV conduction will return. If so, [name] can be discharged home tomorrow as [name] is minimally symptomatic. If [name] remains with CHB, then [name] will need PPM. Will reassess in the a.m.  Leonia Reeves.D.

## 2012-06-24 NOTE — H&P (Addendum)
[name] is an 72 y.o. female.   Chief Complaint: Needs refills of medication- Metoprolol and F/U sick sinus syndrome HPI: The patient is a 72 year old female who presents for a recheck of Atrial fibrillation. This was diagnosed as paroxysmal atrial fibrillation. Management changes made at the last visit include stopping medication (since cardioversion, amiodarone stoppped due to bradycardia.). Patient states that she is doing well except for mild fatigue and mild dyspnea, has noticed mild swelling of her lower extremities recently. No PND or orthopnea. She still is trying to walk on a regular basis and denies any chest pain or palpitations. No dizziness or syncope.  Past Medical History  Diagnosis Date  . Hypertension   . CHF (congestive heart failure)   . Aortic stenosis   . Dysrhythmia   . Shortness of breath     hx dyspnea and respiratory abnormalities    Past Surgical History  Procedure Laterality Date  . Cardiac catheterization    . Aortic valve replacement  10/12/2011    Procedure: AORTIC VALVE REPLACEMENT (AVR);  Surgeon: Alleen Borne, MD;  Location: [facility] OR;  Service: Open Heart Surgery;  Laterality: N/A;  . Mitral valve repair  10/12/2011    Procedure: MITRAL VALVE REPAIR (MVR);  Surgeon: Alleen Borne, MD;  Location: [facility] OR;  Service: Open Heart Surgery;  Laterality: N/A;  . Exploration post operative open heart  10/12/2011    Procedure: EXPLORATION POST OPERATIVE OPEN HEART;  Surgeon: Alleen Borne, MD;  Location: [facility] OR;  Service: Open Heart Surgery;  Laterality: N/A;  . Cardioversion  12/26/2011    Procedure: CARDIOVERSION;  Surgeon: Pamella Pert, MD;  Location: [facility] ENDOSCOPY;  Service: Cardiovascular;  Laterality: N/A;    Family History  Problem Relation Age of Onset  . Hypertension Mother    Social History:  reports that she has never smoked. She does not have any smokeless tobacco history on file. She reports that she does not drink alcohol or use illicit  drugs.  Allergies: No Known Allergies  Medications Prior to Admission  Medication Sig Dispense Refill  . aspirin 81 MG tablet Take 81 mg by mouth daily.      . furosemide (LASIX) 40 MG tablet Take 80 mg by mouth daily.       Marland Kitchen lisinopril (PRINIVIL,ZESTRIL) 10 MG tablet Take 1 tablet (10 mg total) by mouth daily.      Marland Kitchen losartan (COZAAR) 50 MG tablet Take 50 mg by mouth 2 (two) times daily.       . metoprolol tartrate (LOPRESSOR) 25 MG tablet Take 25 mg by mouth 2 (two) times daily. 12.5 mg po BID (1/2 tab of 25 mg)      . potassium chloride SA (K-DUR,KLOR-CON) 20 MEQ tablet Take 20 mEq by mouth daily.       Marland Kitchen spironolactone (ALDACTONE) 25 MG tablet Take 25 mg by mouth daily.      Marland Kitchen warfarin (COUMADIN) 5 MG tablet Take 1.5 mg by mouth daily. As directed based on PT/INR       ROS: General:Present- Feeling well. Not Present- Fever, Night Sweats, Weight Gain and Unable to Sleep Lying Flat. Skin:Not Present- Ulcer. HEENT:Not Present- Headache and Blurred Vision. Respiratory:Not Present- Bloody sputum and Hemoptysis. Cardiovascular:Not Present- Fainting / Blacking Out, Leg Cramps, Leg Pain and/or Swelling and Phlebitis. Gastrointestinal:Not Present- Black, Tarry Stool and Bloody Stool. Musculoskeletal:Not Present- Joint Pain and Physical Disability. Neurological:Not Present- Focal Neurological Symptoms. Psychiatric:Not Present- Anxiety, Fearful and Suicidal Ideation.  Endocrine:Not Present- Heat Intolerance and Polyuria. Hematology:Not Present- Easy Bleeding, Excessive bleeding, Nose Bleed and Petechiae  VS: 96.7 temp, RR 18/min, HR 40/min, reg, O2 sat 98% on RA.   Gen.: Patient is alert oriented x3, moderately built and well nourished, appears stated age and in no acute distress. Cardiac: S1 and S2 is normal. Bradycardia noted. No murmur or gallop appreciated. Chest exam: Bilateral clear breath sounds without any added sounds, no rhonchi. Abdomen: Soft, bowel sounds heard in all  4 quadrants, no organomegaly. No tenderness. Extremities: Full range of movements. No edema. No tenderness. Neuro: Alert oriented x3. No gross focal abnormality. Vascular: Bilateral carotid bruit. Femoral pulse and pedal pulses are intact and normal.  Results for orders placed during the hospital encounter of 06/24/12 (from the past 48 hour(s))  CBC WITH DIFFERENTIAL     Status: Abnormal   Collection Time    06/24/12  4:32 PM      Result Value Range   WBC 5.1  4.0 - 10.5 K/uL   RBC 3.78 (*) 3.87 - 5.11 MIL/uL   Hemoglobin 12.0  12.0 - 15.0 g/dL   HCT 16.1 (*) 09.6 - 04.5 %   MCV 91.3  78.0 - 100.0 fL   MCH 31.7  26.0 - 34.0 pg   MCHC 34.8  30.0 - 36.0 g/dL   RDW 40.9  81.1 - 91.4 %   Platelets 103 (*) 150 - 400 K/uL   Comment: REPEATED TO VERIFY     SPECIMEN CHECKED FOR CLOTS     PLATELETS APPEAR DECREASED     PLATELET COUNT CONFIRMED BY SMEAR   Neutrophils Relative 38 (*) 43 - 77 %   Neutro Abs 1.9  1.7 - 7.7 K/uL   Lymphocytes Relative 58 (*) 12 - 46 %   Lymphs Abs 2.9  0.7 - 4.0 K/uL   Monocytes Relative 2 (*) 3 - 12 %   Monocytes Absolute 0.1  0.1 - 1.0 K/uL   Eosinophils Relative 2  0 - 5 %   Eosinophils Absolute 0.1  0.0 - 0.7 K/uL   Basophils Relative 0  0 - 1 %   Basophils Absolute 0.0  0.0 - 0.1 K/uL  COMPREHENSIVE METABOLIC PANEL     Status: Abnormal   Collection Time    06/24/12  4:32 PM      Result Value Range   Sodium 139  135 - 145 mEq/L   Potassium 4.2  3.5 - 5.1 mEq/L   Chloride 105  96 - 112 mEq/L   CO2 23  19 - 32 mEq/L   Glucose, Bld 84  70 - 99 mg/dL   BUN 34 (*) 6 - 23 mg/dL   Creatinine, Ser 7.82 (*) 0.50 - 1.10 mg/dL   Calcium 9.8  8.4 - 95.6 mg/dL   Total Protein 7.2  6.0 - 8.3 g/dL   Albumin 3.7  3.5 - 5.2 g/dL   AST 15  0 - 37 U/L   ALT 10  0 - 35 U/L   Alkaline Phosphatase 81  39 - 117 U/L   Total Bilirubin 0.4  0.3 - 1.2 mg/dL   GFR calc non Af Amer 34 (*) >90 mL/min   GFR calc Af Amer 39 (*) >90 mL/min   Comment:            The eGFR  has been calculated     using the CKD EPI equation.     This calculation has not been     validated  in all clinical     situations.     eGFR's persistently     <90 mL/min signify     possible Chronic Kidney Disease.  APTT     Status: None   Collection Time    06/24/12  4:32 PM      Result Value Range   aPTT 33  24 - 37 seconds  PROTIME-INR     Status: None   Collection Time    06/24/12  4:32 PM      Result Value Range   Prothrombin Time 14.7  11.6 - 15.2 seconds   INR 1.17  0.00 - 1.49   No results found.  Labs:   Lab Results  Component Value Date   WBC 5.1 06/24/2012   HGB 12.0 06/24/2012   HCT 34.5* 06/24/2012   MCV 91.3 06/24/2012   PLT 103* 06/24/2012    Recent Labs Lab 06/24/12 1632  NA 139  K 4.2  CL 105  CO2 23  BUN 34*  CREATININE 1.49*  CALCIUM 9.8  PROT 7.2  BILITOT 0.4  ALKPHOS 81  ALT 10  AST 15  GLUCOSE 84   Lab Results  Component Value Date   CKTOTAL 40 08/18/2011   CKMB 4.5* 08/18/2011   TROPONINI <0.30 08/18/2011    Lipid Panel     Component Value Date/Time   CHOL 141 08/22/2011 0635   TRIG 72 08/22/2011 0635   HDL 67 08/22/2011 0635   CHOLHDL 2.1 08/22/2011 0635   VLDL 14 08/22/2011 0635   LDLCALC 60 08/22/2011 0635    EKG: 06/25/18/14: Junctional escape rhythm at the rate of 38 bpm, occasional atrial ectopic with ventricular conduction, left axis deviation, left anterior fascicular block, poor R-wave progression, LVH, nonspecific T-wave abnormality.  EKG in my office : EKG 06/24/2012 Marked S. Bradycardia @ 32/min, complete heart block with Junctional escape rhythm. PVC. Poor R progression. Non specific T change. EKG 05/15/2012 S. Bradycardia @ 46/min, Left atrial enlargement. Poor R progression. LVH with repolarization abnormality. Non specific T change.  Assessment/Plan 1. Sick sinus syndrome 2. 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 Dr. Evelene Croon. Office echo: Echo  12/21/11: Left ventricular cavity is normal in size. Borderline global hypokinesis. Borderline decreased systolic global function. Calculated EF 50%. Left atrial cavity is mild to moderately dilated. 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 Right atrial cavity is mildly dilated. Bioprosthetic aortic valve. No aortic regurgitation. Mild aortic stenosis. 3. Post operative A-fib Successful cardioversion 12/26/11. A. Fib to S. Bradycardia. 4. Heart failure, diastolic, chronic  5. Chronic renal insufficiency.  Recommendation: Patient with mild fatigue, marked baseline bradycardia and now patient is in complete heart block in spite of being on a very small dose of a beta blocker. She needs implantation of a permanent pacemaker. I discussed with Dr. Sharrell Ku. Patient was observed in the office for greater than an hour, and in spite of that continues to be bradycardic, I'm concerned that she has complete heart block and is probably not safe for her to return home as she lives by herself. Risk of fall, risk of syncope is fairly high. I have made arrangements for her to be admitted to the hospital and if possible will have the pacemaker placed today or tomorrow. However I will have EP to evaluate her.  Pamella Pert, MD 06/24/2012, 5:37 PM [facility] [place] Pager: [phone] Office: [phone] If no answer: Cell:  [phone]     

## 2012-06-24 NOTE — Progress Notes (Signed)
Pt has purse at bedside that has two yellow toned rings with stones and a yellow toned watched. Pt also has clothing at bedside. Pt has glasses at bedside. No family to give belongings to.

## 2012-06-25 ENCOUNTER — Encounter (HOSPITAL_COMMUNITY)
Admission: AD | Disposition: A | Discharge status: Home / Self Care | Payer: Self-pay | Source: Ambulatory Visit | Attending: Cardiology

## 2012-06-25 SURGERY — PERMANENT PACEMAKER INSERTION
Anesthesia: LOCAL

## 2012-06-25 MED ORDER — PNEUMOCOCCAL VAC POLYVALENT 25 MCG/0.5ML IJ INJ: 0.5000 mL | INJECTION | INTRAMUSCULAR | Status: DC

## 2012-06-25 NOTE — Progress Notes (Addendum)
Pt gave herself her home medications from her purse this morning without asking nursing. She took amlodipine 10mg , lisinipril 20mg , furosemide 20mg , aspirin 325mg , spironolactone 25mg . RN instructed pt to please not take home medications, but to only take medications given by nursing staff that are ordered by MD. Educated pt that this could interfere with pt's care. Notified Dr. Ladona Ridgel and Will continue to monitor.

## 2012-06-25 NOTE — Progress Notes (Signed)
Discharge instructions given to pt. Discussed what medications pt needs to stop taking. Pt verbalized her understanding. Pt is stable for discharge with her sister.

## 2012-06-25 NOTE — Care Management Note (Signed)
    Page 1 of 1   06/25/2012     4:07:45 PM   CARE MANAGEMENT NOTE 06/25/2012  Patient:  [name], [name]   Account Number:  [id number]  Date Initiated:  06/25/2012  Documentation initiated by:  Raianna Slight  Subjective/Objective Assessment:   PT ADM ON 06/24/12 WITH SSS.  PTA, PT INDEPENDENT, LIVES ALONE.     Action/Plan:   WILL FOLLOW FOR HOME NEEDS AS PT PROGRESSES.   Anticipated DC Date:  06/25/2012   Anticipated DC Plan:  HOME/SELF CARE      DC Planning Services  CM consult      Choice offered to / List presented to:             Status of service:  Completed, signed off Medicare Important Message given?   (If response is "NO", the following Medicare IM given date fields will be blank) Date Medicare IM given:   Date Additional Medicare IM given:    Discharge Disposition:  HOME/SELF CARE  Per UR Regulation:  Reviewed for med. necessity/level of care/duration of stay  If discussed at Long Length of Stay Meetings, dates discussed:    Comments:

## 2012-06-25 NOTE — Progress Notes (Signed)
Patient ID: [name], female   DOB: 06-01-1940, 72 y.o.   MRN: [id number] Subjective:  "I feel good"  Objective:  Vital Signs in the last 24 hours: Temp:  [96.7 F (35.9 C)-98.5 F (36.9 C)] 98.5 F (36.9 C) (05/06 0435) Pulse Rate:  [32-36] 32 (05/06 0435) Resp:  [18] 18 (05/06 0435) BP: (92-118)/(47-56) 118/47 mmHg (05/06 0435) SpO2:  [98 %-99 %] 99 % (05/06 0435) Weight:  [135 lb (61.236 kg)-141 lb 11.2 oz (64.275 kg)] 141 lb 11.2 oz (64.275 kg) (05/06 0435)  Intake/Output from previous day: 05/05 0701 - 05/06 0700 In: 720 [P.O.:720] Out: -  Intake/Output from this shift:    Physical Exam: Well appearing elderly woman, NAD HEENT: Unremarkable Neck:  6 cm JVD, no thyromegally Lungs:  Clear except for basilar rales HEART:  Regular brady rhythm, 2/6 systolic murmurs, no rubs, no clicks Abd:  soft, positive bowel sounds, no organomegally, no rebound, no guarding Ext:  2 plus pulses, no edema, no cyanosis, no clubbing Skin:  No rashes no nodules Neuro:  CN II through XII intact, motor grossly intact  Lab Results:  Recent Labs  06/24/12 1632 06/24/12 1925  WBC 5.1 5.4  HGB 12.0 12.3  PLT 103* 102*    Recent Labs  06/24/12 1632 06/24/12 1925  NA 139 137  K 4.2 4.1  CL 105 105  CO2 23 21  GLUCOSE 84 97  BUN 34* 32*  CREATININE 1.49* 1.36*   No results found for this basename: TROPONINI, CK, MB,  in the last 72 hours Hepatic Function Panel  Recent Labs  06/24/12 1632  PROT 7.2  ALBUMIN 3.7  AST 15  ALT 10  ALKPHOS 81  BILITOT 0.4   No results found for this basename: CHOL,  in the last 72 hours No results found for this basename: PROTIME,  in the last 72 hours  Imaging: No results found.  Cardiac Studies: Tele - nsr with sinus bradycardia Assessment/Plan:  1. Sinus bradycardia 2. HTN 3. Transient complete heart block Rec: Her heart rate has improved. Walking in the halls her sinus rate is 72/min and at rest in the 50's. No indication  for PPM. Would hold off on all AV nodal blocking drugs. Ok to discharge home. I do not need to see in followup unless she develops recurrent symptomatic bradycardia off of meds.  LOS: 1 day    Gregg Taylor,M.D. 06/25/2012, 10:36 AM

## 2012-06-25 NOTE — Discharge Summary (Signed)
Physician Discharge Summary  Patient ID: [name] MRN: [id number] DOB/AGE: [birth year] 72 y.o.  Admit date: 06/24/2012 Discharge date: 06/25/2012  Primary Discharge Diagnosis 1. Complete heart Block. Resolved with holding beta blocker. Sick sinus syndrome.   Secondary Discharge Diagnosis 2. 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 Dr. Evelene Croon.  Office echo: Echo 12/21/11: Left ventricular cavity is normal in size. Borderline global hypokinesis. Borderline decreased systolic global function. Calculated EF 50%. Left atrial cavity is mild to moderately dilated. 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 Right atrial cavity is mildly dilated. Bioprosthetic aortic valve. No aortic regurgitation. Mild aortic stenosis.  3. Post operative A-fib Successful cardioversion 12/26/11. A. Fib to S. Bradycardia.  4. Heart failure, diastolic, chronic  5. Chronic renal insufficiency.  Consults: Sharrell Ku, MD. patient was evaluated by Dr. Sharrell Ku, was initially planned on implanting the pacemaker the following day after admission, however patient had reverted back to sinus rhythm, patient did have episodes of complete heart block at rest, however with isolation she was in sinus rhythm with first degree heart block. Hence patient was felt safe for discharge with continued observation and to discontinue beta blockers.  Hospital Course: Please see above patient remained asymptomatic.   Recommendations on discharge: Patient will be followed up in the outpatient basis routinely and if she develops any symptoms of heart block including worsening dyspnea or fatigue, high degree AV block, then she'll need permanent pacemaker implantation. Metoprolol has been discontinued. She was on a very small dose of a beta blocker prior to the hospital admission including Toprol 12.5 mg by mouth twice a day. Suspect she probably has  underlying sinus node dysfunction and also supra-Hisian AV node disease.  Discharge Exam: Blood pressure 118/47, pulse 32, temperature 98.5 F (36.9 C), temperature source Oral, resp. rate 18, height 5\' 4"  (1.626 m), weight 64.275 kg (141 lb 11.2 oz), SpO2 99.00%.   Gen.: Patient is alert oriented x3, moderately built and well nourished, appears stated age and in no acute distress. Cardiac: S1 and S2 is normal. Bradycardia noted. No murmur or gallop appreciated. Chest exam: Bilateral clear breath sounds without any added sounds, no rhonchi. Abdomen: Soft, bowel sounds heard in all 4 quadrants, no organomegaly. No tenderness. Extremities: Full range of movements. No edema. No tenderness. Neuro: Alert oriented x3. No gross focal abnormality. Vascular: Bilateral carotid bruit. Femoral pulse and pedal pulses are intact and normal.  Labs:   Lab Results  Component Value Date   WBC 5.4 06/24/2012   HGB 12.3 06/24/2012   HCT 35.0* 06/24/2012   MCV 90.9 06/24/2012   PLT 102* 06/24/2012    Recent Labs Lab 06/24/12 1632 06/24/12 1925  NA 139 137  K 4.2 4.1  CL 105 105  CO2 23 21  BUN 34* 32*  CREATININE 1.49* 1.36*  CALCIUM 9.8 9.7  PROT 7.2  --   BILITOT 0.4  --   ALKPHOS 81  --   ALT 10  --   AST 15  --   GLUCOSE 84 97   FOLLOW UP PLANS AND APPOINTMENTS: Dr. Jacinto Halim in 2 weeks    Medication List    STOP taking these medications       amLODipine 10 MG tablet  Commonly known as:  NORVASC     losartan 50 MG tablet  Commonly known as:  COZAAR     metoprolol tartrate 25 MG tablet  Commonly  known as:  LOPRESSOR      TAKE these medications       aspirin 325 MG tablet  Take 325 mg by mouth daily.     furosemide 20 MG tablet  Commonly known as:  LASIX  Take 20 mg by mouth daily.     lisinopril 20 MG tablet  Commonly known as:  PRINIVIL,ZESTRIL  Take 20 mg by mouth daily.     potassium chloride SA 20 MEQ tablet  Commonly known as:  K-DUR,KLOR-CON  Take 20 mEq by mouth  daily.     spironolactone 25 MG tablet  Commonly known as:  ALDACTONE  Take 25 mg by mouth daily.          Mindy Pert, MD 06/25/2012, 1:18 PM  Pager: [phone] Office: [phone] If no answer: [phone]

## 2012-11-07 ENCOUNTER — Ambulatory Visit (INDEPENDENT_AMBULATORY_CARE_PROVIDER_SITE_OTHER): Payer: Medicare Other | Admitting: Internal Medicine

## 2012-11-07 ENCOUNTER — Encounter: Payer: Self-pay | Admitting: Internal Medicine

## 2012-11-07 VITALS — BP 150/84 | HR 70 | Ht 63.5 in | Wt 140.0 lb

## 2012-11-07 DIAGNOSIS — I442 Atrioventricular block, complete: Secondary | ICD-10-CM

## 2012-11-07 DIAGNOSIS — I10 Essential (primary) hypertension: Secondary | ICD-10-CM

## 2012-11-07 DIAGNOSIS — I495 Sick sinus syndrome: Secondary | ICD-10-CM

## 2012-11-07 NOTE — Patient Instructions (Addendum)
Your physician recommends that you continue on your current medications as directed. Please refer to the Current Medication list given to you today.  Your physician recommends that you follow-up as needed.  

## 2012-11-07 NOTE — Progress Notes (Signed)
HPI [name] is referred today for evaluation of bradycardia. The patient is followed by Dr. Jacinto Halim.  She has a history of aortic and mitral valve disease and is status post surgical therapy. She has a history of complete heart block which developed several months ago. Her AV nodal blocking drugs were discontinued and her conduction improved. She has persistent bradycardia with heart rates while awake in the low 40s. Despite this, the patient has felt well. She denies syncope, near syncope, or peripheral edema. She has no shortness of breath and ambulates without limitation. No Known Allergies   Current Outpatient Prescriptions  Medication Sig Dispense Refill  . aspirin 325 MG tablet Take 325 mg by mouth daily.      . furosemide (LASIX) 20 MG tablet Take 20 mg by mouth daily.      Marland Kitchen lisinopril (PRINIVIL,ZESTRIL) 20 MG tablet Take 20 mg by mouth daily.      Marland Kitchen spironolactone (ALDACTONE) 25 MG tablet Take 25 mg by mouth daily.       No current facility-administered medications for this visit.     Past Medical History  Diagnosis Date  . Hypertension   . CHF (congestive heart failure)   . Aortic stenosis   . Dysrhythmia   . Shortness of breath     hx dyspnea and respiratory abnormalities  . Atrial fibrillation     Status post cardioversion November 2013    ROS:   All systems reviewed and negative except as noted in the HPI.   Past Surgical History  Procedure Laterality Date  . Cardiac catheterization    . Aortic valve replacement  10/12/2011    Procedure: AORTIC VALVE REPLACEMENT (AVR);  Surgeon: Alleen Borne, MD;  Location: [facility] OR;  Service: Open Heart Surgery;  Laterality: N/A;  . Mitral valve repair  10/12/2011    Procedure: MITRAL VALVE REPAIR (MVR);  Surgeon: Alleen Borne, MD;  Location: [facility] OR;  Service: Open Heart Surgery;  Laterality: N/A;  mitral valve annuloplasty  . Exploration post operative open heart  10/12/2011    Procedure: EXPLORATION POST OPERATIVE OPEN HEART;   Surgeon: Alleen Borne, MD;  Location: [facility] OR;  Service: Open Heart Surgery;  Laterality: N/A;  . Cardioversion  12/26/2011    Procedure: CARDIOVERSION;  Surgeon: Pamella Pert, MD;  Location: [facility] ENDOSCOPY;  Service: Cardiovascular;  Laterality: N/A;     Family History  Problem Relation Age of Onset  . Hypertension Mother      History   Social History  . Marital Status: Single    Spouse Name: N/A    Number of Children: N/A  . Years of Education: N/A   Occupational History  . Not on file.   Social History Main Topics  . Smoking status: Never Smoker   . Smokeless tobacco: Not on file  . Alcohol Use: No  . Drug Use: No  . Sexual Activity:    Other Topics Concern  . Not on file   Social History Narrative  . No narrative on file     BP 150/84  Pulse 70  Ht 5' 3.5" (1.613 m)  Wt 140 lb (63.504 kg)  BMI 24.41 kg/m2  Physical Exam:  Well appearing NAD HEENT: Unremarkable Neck:  No JVD, no thyromegally Back:  No CVA tenderness Lungs:  Clear with no wheezes, rales, or rhonchi. HEART:  Regular rate rhythm, no murmurs, no rubs, no clicks Abd:  soft, positive bowel sounds, no organomegally, no rebound, no guarding Ext:  2 plus pulses, no edema, no cyanosis, no clubbing Skin:  No rashes no nodules Neuro:  CN II through XII intact, motor grossly intact  EKG - normal sinus rhythm with first degree AV block and AV Wenckebach block    Assess/Plan:

## 2012-11-07 NOTE — Assessment & Plan Note (Signed)
The patient's complete heart block has resolved, but she remains bradycardic. I've discussed the treatment options with the patient and have recommended watchful waiting. As she is not symptomatic, there is no indication for permanent pacemaker at this time. Today we discussed the warning signs that her bradycardia had worsened including syncope, near syncope, and unexplained falls or visual loss.

## 2012-11-07 NOTE — Assessment & Plan Note (Signed)
Her blood pressure appears to be well-controlled. She is encouraged to maintain a low-sodium diet. Of course calcium channel blockers and beta blockers are contraindicated in this patient with significant AV conduction system disease.

## 2014-01-29 ENCOUNTER — Encounter (HOSPITAL_COMMUNITY): Payer: Self-pay | Admitting: Cardiology

## 2016-05-21 DIAGNOSIS — Z9289 Personal history of other medical treatment: Secondary | ICD-10-CM

## 2016-05-21 HISTORY — DX: Personal history of other medical treatment: Z92.89

## 2016-06-01 ENCOUNTER — Encounter (HOSPITAL_COMMUNITY): Payer: Self-pay | Admitting: *Deleted

## 2016-06-02 ENCOUNTER — Ambulatory Visit: Payer: Self-pay | Admitting: Orthopedic Surgery

## 2016-06-02 ENCOUNTER — Other Ambulatory Visit: Payer: Self-pay | Admitting: Orthopedic Surgery

## 2016-06-06 ENCOUNTER — Other Ambulatory Visit (HOSPITAL_COMMUNITY): Payer: Medicaid Other

## 2016-06-07 ENCOUNTER — Inpatient Hospital Stay (HOSPITAL_COMMUNITY)
Admission: RE | Admit: 2016-06-07 | Discharge: 2016-06-07 | Disposition: A | Discharge status: Home / Self Care | Payer: Medicaid Other | Source: Ambulatory Visit

## 2016-06-07 ENCOUNTER — Ambulatory Visit: Payer: Self-pay | Admitting: Orthopedic Surgery

## 2016-06-07 NOTE — H&P (Signed)
TOTAL HIP ADMISSION H&P  Patient is admitted for right total hip arthroplasty.  Subjective:  Chief Complaint: right hip pain  HPI: [name], 76 y.o. female, has a history of pain and functional disability in the right hip(s) due to arthritis and patient has failed non-surgical conservative treatments for greater than 12 weeks to include NSAID's and/or analgesics, flexibility and strengthening excercises, use of assistive devices and activity modification.  Onset of symptoms was abrupt starting 1 years ago with rapidlly worsening course since that time.The patient noted no past surgery on the right hip(s).  Patient currently rates pain in the right hip at 10 out of 10 with activity. Patient has night pain, worsening of pain with activity and weight bearing, pain that interfers with activities of daily living, pain with passive range of motion and crepitus. Patient has evidence of subchondral cysts, subchondral sclerosis, joint subluxation and joint space narrowing by imaging studies. This condition presents safety issues increasing the risk of falls. This patient has had AVN with collpase.  There is no current active infection.  Patient Active Problem List   Diagnosis Date Noted  . 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 Aortic regurgitation 10/02/2011  . Anasarca 08/21/2011  . Hypokalemia 08/21/2011  . Diastolic CHF, acute on chronic (HCC) 08/18/2011  . SOB (shortness of breath) 08/18/2011  . Pleural effusion, bilateral 08/18/2011  . Hypoxia 08/18/2011  . Hypertension    Past Medical History:  Diagnosis Date  . Aortic stenosis   . Atrial fibrillation [facility])    Status post cardioversion November 2013  . CHF (congestive heart failure) (HCC)   . Dysrhythmia   . Hypertension   . Shortness of breath    hx dyspnea and respiratory abnormalities    Past Surgical History:  Procedure Laterality Date  . AORTIC VALVE REPLACEMENT  10/12/2011   Procedure: AORTIC VALVE REPLACEMENT (AVR);  Surgeon: Alleen Borne, MD;  Location: [facility] OR;  Service: Open Heart Surgery;  Laterality: N/A;  . CARDIAC CATHETERIZATION    . CARDIOVERSION  12/26/2011   Procedure: CARDIOVERSION;  Surgeon: Pamella Pert, MD;  Location: [facility] ENDOSCOPY;  Service: Cardiovascular;  Laterality: N/A;  . EXPLORATION POST OPERATIVE OPEN HEART  10/12/2011   Procedure: EXPLORATION POST OPERATIVE OPEN HEART;  Surgeon: Alleen Borne, MD;  Location: [facility] OR;  Service: Open Heart Surgery;  Laterality: N/A;  . LEFT AND RIGHT HEART CATHETERIZATION WITH CORONARY ANGIOGRAM N/A 09/11/2011   Procedure: LEFT AND RIGHT HEART CATHETERIZATION WITH CORONARY ANGIOGRAM;  Surgeon: Pamella Pert, MD;  Location: [facility] CATH LAB;  Service: Cardiovascular;  Laterality: N/A;  . MITRAL VALVE REPAIR  10/12/2011   Procedure: MITRAL VALVE REPAIR (MVR);  Surgeon: Alleen Borne, MD;  Location: [facility] OR;  Service: Open Heart Surgery;  Laterality: N/A;  mitral valve annuloplasty     (Not in a hospital admission) No Known Allergies  Social History  Substance Use Topics  . Smoking status: Never Smoker  . Smokeless tobacco: Not on file  . Alcohol use No    Family History  Problem Relation Age of Onset  . Hypertension Mother      Review of Systems  Constitutional: Negative.   HENT: Negative.   Eyes: Negative.   Respiratory: Negative.   Cardiovascular: Negative.   Gastrointestinal: Negative.   Genitourinary: Negative.   Musculoskeletal: Positive for back pain and joint pain.  Skin: Negative.   Neurological: Negative.   Endo/Heme/Allergies: Negative.   Psychiatric/Behavioral: Negative.  Objective:  Physical Exam  Vitals reviewed. Constitutional: She is oriented to person, place, and time. She appears well-developed and well-nourished.  HENT:  Head: Normocephalic and atraumatic.  Eyes: EOM are normal. Pupils are equal, round, and reactive to light.  Neck: Normal range of motion. Neck  supple.  Cardiovascular: Normal rate, regular rhythm and intact distal pulses.   Respiratory: Effort normal. No respiratory distress.  GI: Soft. She exhibits no distension.  Genitourinary:  Genitourinary Comments: deferred  Musculoskeletal:       Right hip: She exhibits decreased range of motion, bony tenderness and crepitus.  Neurological: She is alert and oriented to person, place, and time. She has normal reflexes.  Skin: Skin is warm.  Psychiatric: She has a normal mood and affect. Her behavior is normal. Judgment and thought content normal.    Vital signs in last 24 hours: @  Labs:   Estimated body mass index is 24.41 kg/m as calculated from the following:   Height as of 11/07/12: 5' 3.5" (1.613 m).   Weight as of 11/07/12: 63.5 kg (140 lb).   Imaging Review Plain radiographs demonstrate severe degenerative joint disease of the bilateral hip(s). The bone quality appears to be adequate for age and reported activity level.  Assessment/Plan:  End stage arthritis, right hip(s)  The patient history, physical examination, clinical judgement of the provider and imaging studies are consistent with end stage degenerative joint disease of the right hip(s) and total hip arthroplasty is deemed medically necessary. The treatment options including medical management, injection therapy, arthroscopy and arthroplasty were discussed at length. The risks and benefits of total hip arthroplasty were presented and reviewed. The risks due to aseptic loosening, infection, stiffness, dislocation/subluxation,  thromboembolic complications and other imponderables were discussed.  The patient acknowledged the explanation, agreed to proceed with the plan and consent was signed. Patient is being admitted for inpatient treatment for surgery, pain control, PT, OT, prophylactic antibiotics, VTE prophylaxis, progressive ambulation and ADL's and discharge planning.The patient is planning to be discharged  home with home health services

## 2016-06-09 NOTE — Patient Instructions (Signed)
[name]  06/09/2016   Your procedure is scheduled on: 06/15/2016    Report to [facility] Main  Entrance and take [place] [facility] to 3rd Floor to Short Stay Center at 200pm.       Call this number if you have problems the morning of surgery [phone]    Remember: ONLY 1 PERSON MAY GO WITH YOU TO SHORT STAY TO GET  READY MORNING OF YOUR SURGERY.  Do not eat food after midnite.  May have clear liquids from 12 midnite until 0930am morning of surgery then nothing by mouth.       Take these medicines the morning of surgery with A SIP OF WATER: none                                 You may not have any metal on your body including hair pins and              piercings  Do not wear jewelry, make-up, lotions, powders or perfumes, deodorant             Do not wear nail polish.  Do not shave  48 hours prior to surgery.             .   Do not bring valuables to the hospital. [place] IS NOT             RESPONSIBLE   FOR VALUABLES.  Contacts, dentures or bridgework may not be worn into surgery.  Leave suitcase in the car. After surgery it may be brought to your room.                    Please read over the following fact sheets you were given: _____________________________________________________________________                CLEAR LIQUID DIET   Foods Allowed                                                                     Foods Excluded  Coffee and tea, regular and decaf                             liquids that you cannot  Plain Jell-O in any flavor                                             see through such as: Fruit ices (not with fruit pulp)                                     milk, soups, orange juice  Iced Popsicles                                    All  solid food Carbonated beverages, regular and diet                                    Cranberry, grape and apple juices Sports drinks like Gatorade Lightly seasoned clear broth or  consume(fat free) Sugar, honey syrup  Sample Menu Breakfast                                Lunch                                     Supper Cranberry juice                    Beef broth                            Chicken broth Jell-O                                     Grape juice                           Apple juice Coffee or tea                        Jell-O                                      Popsicle                                                Coffee or tea                        Coffee or tea  _____________________________________________________________________  [facility] - Preparing for Surgery Before surgery, you can play an important role.  Because skin is not sterile, your skin needs to be as free of germs as possible.  You can reduce the number of germs on your skin by washing with CHG (chlorahexidine gluconate) soap before surgery.  CHG is an antiseptic cleaner which kills germs and bonds with the skin to continue killing germs even after washing. Please DO NOT use if you have an allergy to CHG or antibacterial soaps.  If your skin becomes reddened/irritated stop using the CHG and inform your nurse when you arrive at Short Stay. Do not shave (including legs and underarms) for at least 48 hours prior to the first CHG shower.  You may shave your face/neck. Please follow these instructions carefully:  1.  Shower with CHG Soap the night before surgery and the  morning of Surgery.  2.  If you choose to wash your hair, wash your hair first as usual with your  normal  shampoo.  3.  After you shampoo, rinse your hair and body thoroughly to remove the  shampoo.  4.  Use CHG as you would any other liquid soap.  You can apply chg directly  to the skin and wash                       Gently with a scrungie or clean washcloth.  5.  Apply the CHG Soap to your body ONLY FROM THE NECK DOWN.   Do not use on face/ open                           Wound or open sores.  Avoid contact with eyes, ears mouth and genitals (private parts).                       Wash face,  Genitals (private parts) with your normal soap.             6.  Wash thoroughly, paying special attention to the area where your surgery  will be performed.  7.  Thoroughly rinse your body with warm water from the neck down.  8.  DO NOT shower/wash with your normal soap after using and rinsing off  the CHG Soap.                9.  Pat yourself dry with a clean towel.            10.  Wear clean pajamas.            11.  Place clean sheets on your bed the night of your first shower and do not  sleep with pets. Day of Surgery : Do not apply any lotions/deodorants the morning of surgery.  Please wear clean clothes to the hospital/surgery center.  FAILURE TO FOLLOW THESE INSTRUCTIONS MAY RESULT IN THE CANCELLATION OF YOUR SURGERY PATIENT SIGNATURE_________________________________  NURSE SIGNATURE__________________________________  ________________________________________________________________________  WHAT IS A BLOOD TRANSFUSION? Blood Transfusion Information  A transfusion is the replacement of blood or some of its parts. Blood is made up of multiple cells which provide different functions.  Red blood cells carry oxygen and are used for blood loss replacement.  White blood cells fight against infection.  Platelets control bleeding.  Plasma helps clot blood.  Other blood products are available for specialized needs, such as hemophilia or other clotting disorders. BEFORE THE TRANSFUSION  Who gives blood for transfusions?   Healthy volunteers who are fully evaluated to make sure their blood is safe. This is blood bank blood. Transfusion therapy is the safest it has ever been in the practice of medicine. Before blood is taken from a donor, a complete history is taken to make sure that person has no history of diseases nor engages in risky social behavior (examples are intravenous drug use  or sexual activity with multiple partners). The donor's travel history is screened to minimize risk of transmitting infections, such as malaria. The donated blood is tested for signs of infectious diseases, such as HIV and hepatitis. The blood is then tested to be sure it is compatible with you in order to minimize the chance of a transfusion reaction. If you or a relative donates blood, this is often done in anticipation of surgery and is not appropriate for emergency situations. It takes many days to process the donated blood. RISKS AND COMPLICATIONS Although transfusion therapy is very safe and saves many lives, the main dangers of transfusion include:   Getting an infectious disease.  Developing a transfusion reaction.  This is an allergic reaction to something in the blood you were given. Every precaution is taken to prevent this. The decision to have a blood transfusion has been considered carefully by your caregiver before blood is given. Blood is not given unless the benefits outweigh the risks. AFTER THE TRANSFUSION  Right after receiving a blood transfusion, you will usually feel much better and more energetic. This is especially true if your red blood cells have gotten low (anemic). The transfusion raises the level of the red blood cells which carry oxygen, and this usually causes an energy increase.  The nurse administering the transfusion will monitor you carefully for complications. HOME CARE INSTRUCTIONS  No special instructions are needed after a transfusion. You may find your energy is better. Speak with your caregiver about any limitations on activity for underlying diseases you may have. SEEK MEDICAL CARE IF:   Your condition is not improving after your transfusion.  You develop redness or irritation at the intravenous (IV) site. SEEK IMMEDIATE MEDICAL CARE IF:  Any of the following symptoms occur over the next 12 hours:  Shaking chills.  You have a temperature by mouth  above 102 F (38.9 C), not controlled by medicine.  Chest, back, or muscle pain.  People around you feel you are not acting correctly or are confused.  Shortness of breath or difficulty breathing.  Dizziness and fainting.  You get a rash or develop hives.  You have a decrease in urine output.  Your urine turns a dark color or changes to pink, red, or brown. Any of the following symptoms occur over the next 10 days:  You have a temperature by mouth above 102 F (38.9 C), not controlled by medicine.  Shortness of breath.  Weakness after normal activity.  The white part of the eye turns yellow (jaundice).  You have a decrease in the amount of urine or are urinating less often.  Your urine turns a dark color or changes to pink, red, or brown. Document Released: 02/04/2000 Document Revised: 05/01/2011 Document Reviewed: 09/23/2007 Exit[employer] Patient Information 2014 [place], [place].  _______________________________________________________________________  Incentive Spirometer  An incentive spirometer is a tool that can help keep your lungs clear and active. This tool measures how well you are filling your lungs with each breath. Taking long deep breaths may help reverse or decrease the chance of developing breathing (pulmonary) problems (especially infection) following:  A long period of time when you are unable to move or be active. BEFORE THE PROCEDURE   If the spirometer includes an indicator to show your best effort, your nurse or respiratory therapist will set it to a desired goal.  If possible, sit up straight or lean slightly forward. Try not to slouch.  Hold the incentive spirometer in an upright position. INSTRUCTIONS FOR USE  1. Sit on the edge of your bed if possible, or sit up as far as you can in bed or on a chair. 2. Hold the incentive spirometer in an upright position. 3. Breathe out normally. 4. Place the mouthpiece in your mouth and seal your lips tightly  around it. 5. Breathe in slowly and as deeply as possible, raising the piston or the ball toward the top of the column. 6. Hold your breath for 3-5 seconds or for as long as possible. Allow the piston or ball to fall to the bottom of the column. 7. Remove the mouthpiece from your mouth and breathe out normally. 8. Rest for a few seconds and repeat Steps  1 through 7 at least 10 times every 1-2 hours when you are awake. Take your time and take a few normal breaths between deep breaths. 9. The spirometer may include an indicator to show your best effort. Use the indicator as a goal to work toward during each repetition. 10. After each set of 10 deep breaths, practice coughing to be sure your lungs are clear. If you have an incision (the cut made at the time of surgery), support your incision when coughing by placing a pillow or rolled up towels firmly against it. Once you are able to get out of bed, walk around indoors and cough well. You may stop using the incentive spirometer when instructed by your caregiver.  RISKS AND COMPLICATIONS  Take your time so you do not get dizzy or light-headed.  If you are in pain, you may need to take or ask for pain medication before doing incentive spirometry. It is harder to take a deep breath if you are having pain. AFTER USE  Rest and breathe slowly and easily.  It can be helpful to keep track of a log of your progress. Your caregiver can provide you with a simple table to help with this. If you are using the spirometer at home, follow these instructions: [place] IF:   You are having difficultly using the spirometer.  You have trouble using the spirometer as often as instructed.  Your pain medication is not giving enough relief while using the spirometer.  You develop fever of 100.5 F (38.1 C) or higher. SEEK IMMEDIATE MEDICAL CARE IF:   You cough up bloody sputum that had not been present before.  You develop fever of 102 F (38.9 C) or  greater.  You develop worsening pain at or near the incision site. MAKE SURE YOU:   Understand these instructions.  Will watch your condition.  Will get help right away if you are not doing well or get worse. Document Released: 06/19/2006 Document Revised: 05/01/2011 Document Reviewed: 08/20/2006 [facility] Patient Information 2014 [place], [place].   ________________________________________________________________________

## 2016-06-12 ENCOUNTER — Encounter (HOSPITAL_COMMUNITY): Payer: Self-pay

## 2016-06-12 ENCOUNTER — Encounter (HOSPITAL_COMMUNITY)
Admission: RE | Admit: 2016-06-12 | Discharge: 2016-06-12 | Disposition: A | Discharge status: Home / Self Care | Payer: Medicare Other | Source: Ambulatory Visit | Attending: Orthopedic Surgery | Admitting: Orthopedic Surgery

## 2016-06-12 ENCOUNTER — Other Ambulatory Visit: Payer: Self-pay | Admitting: Orthopedic Surgery

## 2016-06-12 HISTORY — DX: Unspecified osteoarthritis, unspecified site: M19.90

## 2016-06-12 LAB — CBC
HCT: 35.4 % — ABNORMAL LOW (ref 36.0–46.0)
Hemoglobin: 11.7 g/dL — ABNORMAL LOW (ref 12.0–15.0)
MCH: 31.6 pg (ref 26.0–34.0)
MCHC: 33.1 g/dL (ref 30.0–36.0)
MCV: 95.7 fL (ref 78.0–100.0)
Platelets: 138 10*3/uL — ABNORMAL LOW (ref 150–400)
RBC: 3.7 MIL/uL — ABNORMAL LOW (ref 3.87–5.11)
RDW: 14.6 % (ref 11.5–15.5)
WBC: 4.5 10*3/uL (ref 4.0–10.5)

## 2016-06-12 LAB — BASIC METABOLIC PANEL
Anion gap: 7 (ref 5–15)
BUN: 27 mg/dL — AB (ref 6–20)
CHLORIDE: 108 mmol/L (ref 101–111)
CO2: 26 mmol/L (ref 22–32)
CREATININE: 1.17 mg/dL — AB (ref 0.44–1.00)
Calcium: 11 mg/dL — ABNORMAL HIGH (ref 8.9–10.3)
GFR calc Af Amer: 51 mL/min — ABNORMAL LOW (ref >60)
GFR calc non Af Amer: 44 mL/min — ABNORMAL LOW (ref >60)
GLUCOSE: 99 mg/dL (ref 65–99)
POTASSIUM: 4.2 mmol/L (ref 3.5–5.1)
Sodium: 141 mmol/L (ref 135–145)

## 2016-06-12 LAB — SURGICAL PCR SCREEN
MRSA, PCR: NEGATIVE
Staphylococcus aureus: NEGATIVE

## 2016-06-12 LAB — ABO/RH: ABO/RH(D): A POS

## 2016-06-12 NOTE — Progress Notes (Signed)
BMP done 06/12/16 faxed via epic to Dr Linna Caprice

## 2016-06-12 NOTE — Progress Notes (Addendum)
DR Jacinto Halim- 06/04/16- Clearance on chart  LOV- DR Jacinto Halim- 03/22/16- on chart  EKG- !/31/18-on chart  ECHO-2017 on chart  Dr Modesto Charon- 04/25/16- clearance on chart

## 2016-06-15 ENCOUNTER — Inpatient Hospital Stay (HOSPITAL_COMMUNITY)
Admission: RE | Admit: 2016-06-15 | Discharge: 2016-06-19 | DRG: 470 | Disposition: A | Discharge status: Home Health | Payer: Medicare Other | Source: Ambulatory Visit | Attending: Orthopedic Surgery | Admitting: Orthopedic Surgery

## 2016-06-15 ENCOUNTER — Inpatient Hospital Stay (HOSPITAL_COMMUNITY): Payer: Medicare Other

## 2016-06-15 ENCOUNTER — Encounter (HOSPITAL_COMMUNITY)
Admission: RE | Disposition: A | Discharge status: Home Health | Payer: Self-pay | Source: Ambulatory Visit | Attending: Orthopedic Surgery

## 2016-06-15 ENCOUNTER — Inpatient Hospital Stay (HOSPITAL_COMMUNITY): Payer: Medicare Other | Admitting: Anesthesiology

## 2016-06-15 ENCOUNTER — Encounter (HOSPITAL_COMMUNITY): Payer: Self-pay | Admitting: *Deleted

## 2016-06-15 DIAGNOSIS — N183 Chronic kidney disease, stage 3 (moderate): Secondary | ICD-10-CM | POA: Diagnosis present

## 2016-06-15 DIAGNOSIS — I959 Hypotension, unspecified: Secondary | ICD-10-CM | POA: Diagnosis not present

## 2016-06-15 DIAGNOSIS — I130 Hypertensive heart and chronic kidney disease with heart failure and stage 1 through stage 4 chronic kidney disease, or unspecified chronic kidney disease: Secondary | ICD-10-CM | POA: Diagnosis present

## 2016-06-15 DIAGNOSIS — I5032 Chronic diastolic (congestive) heart failure: Secondary | ICD-10-CM | POA: Diagnosis present

## 2016-06-15 DIAGNOSIS — I4891 Unspecified atrial fibrillation: Secondary | ICD-10-CM | POA: Diagnosis present

## 2016-06-15 DIAGNOSIS — D62 Acute posthemorrhagic anemia: Secondary | ICD-10-CM | POA: Diagnosis not present

## 2016-06-15 DIAGNOSIS — M1611 Unilateral primary osteoarthritis, right hip: Secondary | ICD-10-CM | POA: Diagnosis not present

## 2016-06-15 DIAGNOSIS — R9431 Abnormal electrocardiogram [ECG] [EKG]: Secondary | ICD-10-CM | POA: Diagnosis not present

## 2016-06-15 DIAGNOSIS — I495 Sick sinus syndrome: Secondary | ICD-10-CM | POA: Diagnosis not present

## 2016-06-15 DIAGNOSIS — Y831 Surgical operation with implant of artificial internal device as the cause of abnormal reaction of the patient, or of later complication, without mention of misadventure at the time of the procedure: Secondary | ICD-10-CM | POA: Diagnosis present

## 2016-06-15 DIAGNOSIS — Z09 Encounter for follow-up examination after completed treatment for conditions other than malignant neoplasm: Secondary | ICD-10-CM

## 2016-06-15 DIAGNOSIS — I442 Atrioventricular block, complete: Secondary | ICD-10-CM | POA: Diagnosis present

## 2016-06-15 DIAGNOSIS — Z23 Encounter for immunization: Secondary | ICD-10-CM | POA: Diagnosis present

## 2016-06-15 DIAGNOSIS — T82857A Stenosis of cardiac prosthetic devices, implants and grafts, initial encounter: Secondary | ICD-10-CM | POA: Diagnosis not present

## 2016-06-15 DIAGNOSIS — M25551 Pain in right hip: Secondary | ICD-10-CM

## 2016-06-15 HISTORY — PX: TOTAL HIP ARTHROPLASTY: SHX124

## 2016-06-15 SURGERY — ARTHROPLASTY, HIP, TOTAL, ANTERIOR APPROACH
Anesthesia: Spinal | Site: Hip | Laterality: Right

## 2016-06-15 MED ORDER — MEPERIDINE HCL 50 MG/ML IJ SOLN: 6.2500 mg | INTRAMUSCULAR | Status: DC | PRN

## 2016-06-15 MED ORDER — BUPIVACAINE-EPINEPHRINE 0.25% -1:200000 IJ SOLN
INTRAMUSCULAR | Status: DC | PRN
  Administered 2016-06-15: 30 mL

## 2016-06-15 MED ORDER — FENTANYL CITRATE (PF) 100 MCG/2ML IJ SOLN
INTRAMUSCULAR | Status: AC
  Filled 2016-06-15: qty 2

## 2016-06-15 MED ORDER — PHENOL 1.4 % MT LIQD
1.0000 | OROMUCOSAL | Status: DC | PRN
  Filled 2016-06-15: qty 177

## 2016-06-15 MED ORDER — SODIUM CHLORIDE 0.9 % IV SOLN
INTRAVENOUS | Status: DC
  Administered 2016-06-15: 21:00:00 via INTRAVENOUS

## 2016-06-15 MED ORDER — FUROSEMIDE 20 MG PO TABS
20.0000 mg | ORAL_TABLET | Freq: Every day | ORAL | Status: DC
  Administered 2016-06-16 – 2016-06-18 (×3): 20 mg via ORAL
  Filled 2016-06-15 (×3): qty 1

## 2016-06-15 MED ORDER — SODIUM CHLORIDE 0.9 % IJ SOLN
INTRAMUSCULAR | Status: DC | PRN
  Administered 2016-06-15: 30 mL

## 2016-06-15 MED ORDER — HYDROCODONE-ACETAMINOPHEN 5-325 MG PO TABS
1.0000 | ORAL_TABLET | ORAL | Status: DC | PRN
  Administered 2016-06-17 – 2016-06-19 (×8): 1 via ORAL
  Filled 2016-06-15 (×8): qty 1

## 2016-06-15 MED ORDER — DEXAMETHASONE SODIUM PHOSPHATE 10 MG/ML IJ SOLN
INTRAMUSCULAR | Status: DC | PRN
  Administered 2016-06-15: 10 mg via INTRAVENOUS

## 2016-06-15 MED ORDER — DOPAMINE-DEXTROSE 3.2-5 MG/ML-% IV SOLN
INTRAVENOUS | Status: AC
  Filled 2016-06-15: qty 250

## 2016-06-15 MED ORDER — KETOROLAC TROMETHAMINE 30 MG/ML IJ SOLN
INTRAMUSCULAR | Status: DC | PRN
  Administered 2016-06-15: 30 mg

## 2016-06-15 MED ORDER — HYDROMORPHONE HCL 1 MG/ML IJ SOLN: 0.2500 mg | INTRAMUSCULAR | Status: DC | PRN

## 2016-06-15 MED ORDER — HYDROMORPHONE HCL 1 MG/ML IJ SOLN
0.5000 mg | INTRAMUSCULAR | Status: DC | PRN
  Administered 2016-06-17 (×2): 1 mg via INTRAVENOUS
  Filled 2016-06-15 (×3): qty 1

## 2016-06-15 MED ORDER — MIDAZOLAM HCL 2 MG/2ML IJ SOLN
INTRAMUSCULAR | Status: DC | PRN
  Administered 2016-06-15: 0.5 mg via INTRAVENOUS

## 2016-06-15 MED ORDER — PROPOFOL 10 MG/ML IV BOLUS
INTRAVENOUS | Status: AC
  Filled 2016-06-15: qty 20

## 2016-06-15 MED ORDER — TRANEXAMIC ACID 1000 MG/10ML IV SOLN
1000.0000 mg | INTRAVENOUS | Status: AC
  Administered 2016-06-15: 1000 mg via INTRAVENOUS
  Filled 2016-06-15: qty 1100
  Filled 2016-06-15: qty 10

## 2016-06-15 MED ORDER — LIDOCAINE 2% (20 MG/ML) 5 ML SYRINGE
INTRAMUSCULAR | Status: DC | PRN
  Administered 2016-06-15: 20 mg via INTRAVENOUS

## 2016-06-15 MED ORDER — ALBUMIN HUMAN 5 % IV SOLN
INTRAVENOUS | Status: DC | PRN
  Administered 2016-06-15 (×2): via INTRAVENOUS

## 2016-06-15 MED ORDER — SPIRONOLACTONE 25 MG PO TABS
25.0000 mg | ORAL_TABLET | Freq: Every day | ORAL | Status: DC
  Administered 2016-06-16 – 2016-06-18 (×3): 25 mg via ORAL
  Filled 2016-06-15 (×3): qty 1

## 2016-06-15 MED ORDER — PHENYLEPHRINE 40 MCG/ML (10ML) SYRINGE FOR IV PUSH (FOR BLOOD PRESSURE SUPPORT)
PREFILLED_SYRINGE | INTRAVENOUS | Status: DC | PRN
  Administered 2016-06-15: 80 ug via INTRAVENOUS
  Administered 2016-06-15 (×2): 120 ug via INTRAVENOUS
  Administered 2016-06-15: 80 ug via INTRAVENOUS

## 2016-06-15 MED ORDER — WATER FOR IRRIGATION, STERILE IR SOLN
Status: DC | PRN
  Administered 2016-06-15: 2000 mL

## 2016-06-15 MED ORDER — KETOROLAC TROMETHAMINE 15 MG/ML IJ SOLN
7.5000 mg | Freq: Four times a day (QID) | INTRAMUSCULAR | Status: AC
  Administered 2016-06-16 (×2): 7.5 mg via INTRAVENOUS
  Filled 2016-06-15 (×2): qty 1

## 2016-06-15 MED ORDER — METOCLOPRAMIDE HCL 5 MG PO TABS: 5.0000 mg | ORAL_TABLET | Freq: Three times a day (TID) | ORAL | Status: DC | PRN

## 2016-06-15 MED ORDER — PHENYLEPHRINE HCL 10 MG/ML IJ SOLN
INTRAVENOUS | Status: DC | PRN
  Administered 2016-06-15: 25 ug/min via INTRAVENOUS

## 2016-06-15 MED ORDER — KETOROLAC TROMETHAMINE 30 MG/ML IJ SOLN
INTRAMUSCULAR | Status: AC
  Filled 2016-06-15: qty 1

## 2016-06-15 MED ORDER — MIDAZOLAM HCL 2 MG/2ML IJ SOLN
INTRAMUSCULAR | Status: AC
  Filled 2016-06-15: qty 2

## 2016-06-15 MED ORDER — LACTATED RINGERS IV SOLN
INTRAVENOUS | Status: DC
  Administered 2016-06-15 (×2): via INTRAVENOUS

## 2016-06-15 MED ORDER — LIDOCAINE 2% (20 MG/ML) 5 ML SYRINGE
INTRAMUSCULAR | Status: AC
  Filled 2016-06-15: qty 20

## 2016-06-15 MED ORDER — FENTANYL CITRATE (PF) 100 MCG/2ML IJ SOLN
INTRAMUSCULAR | Status: DC | PRN
  Administered 2016-06-15: 50 ug via INTRAVENOUS

## 2016-06-15 MED ORDER — CHLORHEXIDINE GLUCONATE 4 % EX LIQD: 60.0000 mL | Freq: Once | CUTANEOUS | Status: DC

## 2016-06-15 MED ORDER — BUPIVACAINE HCL (PF) 0.25 % IJ SOLN
INTRAMUSCULAR | Status: AC
  Filled 2016-06-15: qty 30

## 2016-06-15 MED ORDER — ONDANSETRON HCL 4 MG PO TABS: 4.0000 mg | ORAL_TABLET | Freq: Four times a day (QID) | ORAL | Status: DC | PRN

## 2016-06-15 MED ORDER — DEXAMETHASONE SODIUM PHOSPHATE 10 MG/ML IJ SOLN
INTRAMUSCULAR | Status: AC
Start: 2016-06-15 — End: 2016-06-15
  Filled 2016-06-15: qty 10

## 2016-06-15 MED ORDER — CEFAZOLIN SODIUM-DEXTROSE 1-4 GM/50ML-% IV SOLN
1.0000 g | Freq: Four times a day (QID) | INTRAVENOUS | Status: AC
  Administered 2016-06-15 – 2016-06-16 (×2): 1 g via INTRAVENOUS
  Filled 2016-06-15 (×3): qty 50

## 2016-06-15 MED ORDER — SODIUM CHLORIDE 0.9 % IV SOLN: INTRAVENOUS | Status: DC

## 2016-06-15 MED ORDER — EPHEDRINE 5 MG/ML INJ
INTRAVENOUS | Status: AC
  Filled 2016-06-15: qty 30

## 2016-06-15 MED ORDER — POLYETHYLENE GLYCOL 3350 17 G PO PACK
17.0000 g | PACK | Freq: Every day | ORAL | Status: DC | PRN
  Administered 2016-06-18: 17 g via ORAL
  Filled 2016-06-15: qty 1

## 2016-06-15 MED ORDER — PHENYLEPHRINE HCL 10 MG/ML IJ SOLN
0.0000 ug/min | INTRAMUSCULAR | Status: DC
  Administered 2016-06-15: 25 ug/min via INTRAVENOUS

## 2016-06-15 MED ORDER — ASPIRIN 81 MG PO CHEW
81.0000 mg | CHEWABLE_TABLET | Freq: Two times a day (BID) | ORAL | Status: DC
  Administered 2016-06-15 – 2016-06-18 (×7): 81 mg via ORAL
  Filled 2016-06-15 (×7): qty 1

## 2016-06-15 MED ORDER — ONDANSETRON HCL 4 MG/2ML IJ SOLN
INTRAMUSCULAR | Status: AC
  Filled 2016-06-15: qty 8

## 2016-06-15 MED ORDER — ONDANSETRON HCL 4 MG/2ML IJ SOLN
INTRAMUSCULAR | Status: DC | PRN
  Administered 2016-06-15: 4 mg via INTRAVENOUS

## 2016-06-15 MED ORDER — METHOCARBAMOL 500 MG PO TABS
500.0000 mg | ORAL_TABLET | Freq: Four times a day (QID) | ORAL | Status: DC | PRN
Start: 2016-06-15 — End: 2016-06-19

## 2016-06-15 MED ORDER — PHENYLEPHRINE 40 MCG/ML (10ML) SYRINGE FOR IV PUSH (FOR BLOOD PRESSURE SUPPORT)
PREFILLED_SYRINGE | INTRAVENOUS | Status: AC
  Filled 2016-06-15: qty 10

## 2016-06-15 MED ORDER — SODIUM CHLORIDE 0.9 % IJ SOLN
INTRAMUSCULAR | Status: AC
  Filled 2016-06-15: qty 50

## 2016-06-15 MED ORDER — PROPOFOL 500 MG/50ML IV EMUL
INTRAVENOUS | Status: DC | PRN
  Administered 2016-06-15: 50 ug/kg/min via INTRAVENOUS

## 2016-06-15 MED ORDER — DOCUSATE SODIUM 100 MG PO CAPS
100.0000 mg | ORAL_CAPSULE | Freq: Two times a day (BID) | ORAL | Status: DC
  Administered 2016-06-16 – 2016-06-18 (×6): 100 mg via ORAL
  Filled 2016-06-15 (×6): qty 1

## 2016-06-15 MED ORDER — ACETAMINOPHEN 650 MG RE SUPP: 650.0000 mg | Freq: Four times a day (QID) | RECTAL | Status: DC | PRN

## 2016-06-15 MED ORDER — LISINOPRIL 5 MG PO TABS
2.5000 mg | ORAL_TABLET | Freq: Every day | ORAL | Status: DC
  Filled 2016-06-15 (×2): qty 1

## 2016-06-15 MED ORDER — SENNA 8.6 MG PO TABS
2.0000 | ORAL_TABLET | Freq: Every day | ORAL | Status: DC
  Administered 2016-06-16 – 2016-06-18 (×3): 17.2 mg via ORAL
  Filled 2016-06-15 (×3): qty 2

## 2016-06-15 MED ORDER — GLYCOPYRROLATE 0.2 MG/ML IJ SOLN
INTRAMUSCULAR | Status: DC | PRN
  Administered 2016-06-15 (×2): 0.2 mg via INTRAVENOUS

## 2016-06-15 MED ORDER — ROCURONIUM BROMIDE 50 MG/5ML IV SOSY
PREFILLED_SYRINGE | INTRAVENOUS | Status: AC
  Filled 2016-06-15: qty 5

## 2016-06-15 MED ORDER — PROMETHAZINE HCL 25 MG/ML IJ SOLN: 6.2500 mg | INTRAMUSCULAR | Status: DC | PRN

## 2016-06-15 MED ORDER — MENTHOL 3 MG MT LOZG: 1.0000 | LOZENGE | OROMUCOSAL | Status: DC | PRN

## 2016-06-15 MED ORDER — PROPOFOL 10 MG/ML IV BOLUS
INTRAVENOUS | Status: DC | PRN
  Administered 2016-06-15 (×4): 20 mg via INTRAVENOUS

## 2016-06-15 MED ORDER — PHENYLEPHRINE HCL 10 MG/ML IJ SOLN
INTRAMUSCULAR | Status: AC
  Filled 2016-06-15: qty 1

## 2016-06-15 MED ORDER — METOCLOPRAMIDE HCL 5 MG/ML IJ SOLN: 5.0000 mg | Freq: Three times a day (TID) | INTRAMUSCULAR | Status: DC | PRN

## 2016-06-15 MED ORDER — POVIDONE-IODINE 10 % EX SWAB: 2.0000 "application " | Freq: Once | CUTANEOUS | Status: DC

## 2016-06-15 MED ORDER — AMLODIPINE BESYLATE 10 MG PO TABS: 10.0000 mg | ORAL_TABLET | Freq: Every evening | ORAL | Status: DC

## 2016-06-15 MED ORDER — SUCCINYLCHOLINE CHLORIDE 200 MG/10ML IV SOSY
PREFILLED_SYRINGE | INTRAVENOUS | Status: AC
  Filled 2016-06-15: qty 10

## 2016-06-15 MED ORDER — BUPIVACAINE HCL (PF) 0.5 % IJ SOLN
INTRAMUSCULAR | Status: DC | PRN
  Administered 2016-06-15: 3 mL via INTRATHECAL

## 2016-06-15 MED ORDER — ALBUMIN HUMAN 5 % IV SOLN
INTRAVENOUS | Status: AC
  Filled 2016-06-15: qty 500

## 2016-06-15 MED ORDER — ACETAMINOPHEN 10 MG/ML IV SOLN
1000.0000 mg | INTRAVENOUS | Status: AC
  Administered 2016-06-15: 1000 mg via INTRAVENOUS
  Filled 2016-06-15: qty 100

## 2016-06-15 MED ORDER — DEXAMETHASONE SODIUM PHOSPHATE 10 MG/ML IJ SOLN
10.0000 mg | Freq: Once | INTRAMUSCULAR | Status: AC
Start: 2016-06-16 — End: 2016-06-16
  Administered 2016-06-16: 10 mg via INTRAVENOUS
  Filled 2016-06-15: qty 1

## 2016-06-15 MED ORDER — ONDANSETRON HCL 4 MG/2ML IJ SOLN
4.0000 mg | Freq: Four times a day (QID) | INTRAMUSCULAR | Status: DC | PRN
  Administered 2016-06-16: 4 mg via INTRAVENOUS
  Filled 2016-06-15: qty 2

## 2016-06-15 MED ORDER — EPHEDRINE SULFATE-NACL 50-0.9 MG/10ML-% IV SOSY
PREFILLED_SYRINGE | INTRAVENOUS | Status: DC | PRN
  Administered 2016-06-15 (×4): 10 mg via INTRAVENOUS
  Administered 2016-06-15: 5 mg via INTRAVENOUS
  Administered 2016-06-15: 10 mg via INTRAVENOUS
  Administered 2016-06-15: 5 mg via INTRAVENOUS
  Administered 2016-06-15 (×2): 10 mg via INTRAVENOUS

## 2016-06-15 MED ORDER — SODIUM CHLORIDE 0.9 % IR SOLN
Status: DC | PRN
  Administered 2016-06-15: 1000 mL

## 2016-06-15 MED ORDER — CEFAZOLIN SODIUM-DEXTROSE 2-4 GM/100ML-% IV SOLN
2.0000 g | INTRAVENOUS | Status: AC
  Administered 2016-06-15: 2 g via INTRAVENOUS
  Filled 2016-06-15: qty 100

## 2016-06-15 MED ORDER — ACETAMINOPHEN 325 MG PO TABS: 650.0000 mg | ORAL_TABLET | Freq: Four times a day (QID) | ORAL | Status: DC | PRN

## 2016-06-15 MED ORDER — TRANEXAMIC ACID 1000 MG/10ML IV SOLN
1000.0000 mg | Freq: Once | INTRAVENOUS | Status: AC
  Administered 2016-06-15: 1000 mg via INTRAVENOUS
  Filled 2016-06-15: qty 10

## 2016-06-15 MED ORDER — METHOCARBAMOL 1000 MG/10ML IJ SOLN
500.0000 mg | Freq: Four times a day (QID) | INTRAVENOUS | Status: DC | PRN
  Filled 2016-06-15: qty 5

## 2016-06-15 SURGICAL SUPPLY — 42 items
BAG DECANTER FOR FLEXI CONT (MISCELLANEOUS) IMPLANT
BAG ZIPLOCK 12X15 (MISCELLANEOUS) ×3 IMPLANT
CAPT HIP TOTAL 2 ×3 IMPLANT
CHLORAPREP W/TINT 26ML (MISCELLANEOUS) ×3 IMPLANT
CLOTH BEACON ORANGE TIMEOUT ST (SAFETY) ×3 IMPLANT
COVER PERINEAL POST (MISCELLANEOUS) ×3 IMPLANT
COVER SURGICAL LIGHT HANDLE (MISCELLANEOUS) ×3 IMPLANT
DECANTER SPIKE VIAL GLASS SM (MISCELLANEOUS) ×3 IMPLANT
DERMABOND ADVANCED (GAUZE/BANDAGES/DRESSINGS) ×4
DERMABOND ADVANCED .7 DNX12 (GAUZE/BANDAGES/DRESSINGS) ×2 IMPLANT
DRAPE SHEET LG 3/4 BI-LAMINATE (DRAPES) ×9 IMPLANT
DRAPE STERI IOBAN 125X83 (DRAPES) ×3 IMPLANT
DRAPE U-SHAPE 47X51 STRL (DRAPES) ×6 IMPLANT
DRSG AQUACEL AG ADV 3.5X10 (GAUZE/BANDAGES/DRESSINGS) ×3 IMPLANT
ELECT PENCIL ROCKER SW 15FT (MISCELLANEOUS) ×3 IMPLANT
ELECT REM PT RETURN 15FT ADLT (MISCELLANEOUS) ×3 IMPLANT
GAUZE SPONGE 4X4 12PLY STRL (GAUZE/BANDAGES/DRESSINGS) ×3 IMPLANT
GLOVE BIO SURGEON STRL SZ8.5 (GLOVE) ×6 IMPLANT
GLOVE BIOGEL PI IND STRL 8.5 (GLOVE) ×1 IMPLANT
GLOVE BIOGEL PI INDICATOR 8.5 (GLOVE) ×2
GOWN SPEC L3 XXLG W/TWL (GOWN DISPOSABLE) ×3 IMPLANT
HANDPIECE INTERPULSE COAX TIP (DISPOSABLE) ×2
HOLDER FOLEY CATH W/STRAP (MISCELLANEOUS) ×3 IMPLANT
HOOD PEEL AWAY FLYTE STAYCOOL (MISCELLANEOUS) ×6 IMPLANT
MARKER SKIN DUAL TIP RULER LAB (MISCELLANEOUS) ×3 IMPLANT
NEEDLE SPNL 18GX3.5 QUINCKE PK (NEEDLE) ×3 IMPLANT
PACK ANTERIOR HIP CUSTOM (KITS) ×3 IMPLANT
SAW OSC TIP CART 19.5X105X1.3 (SAW) ×3 IMPLANT
SEALER BIPOLAR AQUA 6.0 (INSTRUMENTS) ×3 IMPLANT
SET HNDPC FAN SPRY TIP SCT (DISPOSABLE) ×1 IMPLANT
SUT ETHIBOND NAB CT1 #1 30IN (SUTURE) ×6 IMPLANT
SUT MNCRL AB 3-0 PS2 18 (SUTURE) ×3 IMPLANT
SUT MON AB 2-0 CT1 36 (SUTURE) ×6 IMPLANT
SUT STRATAFIX PDO 1 14 VIOLET (SUTURE) ×2
SUT STRATFX PDO 1 14 VIOLET (SUTURE) ×1
SUT VIC AB 2-0 CT1 27 (SUTURE) ×2
SUT VIC AB 2-0 CT1 TAPERPNT 27 (SUTURE) ×1 IMPLANT
SUTURE STRATFX PDO 1 14 VIOLET (SUTURE) ×1 IMPLANT
SYR 50ML LL SCALE MARK (SYRINGE) ×3 IMPLANT
TRAY FOLEY W/METER SILVER 16FR (SET/KITS/TRAYS/PACK) IMPLANT
WATER STERILE IRR 1500ML POUR (IV SOLUTION) ×3 IMPLANT
YANKAUER SUCT BULB TIP 10FT TU (MISCELLANEOUS) ×3 IMPLANT

## 2016-06-15 NOTE — Anesthesia Postprocedure Evaluation (Signed)
Anesthesia Post Note  Patient: [name]  Procedure(s) Performed: Procedure(s) (LRB): RIGHT TOTAL HIP ARTHROPLASTY ANTERIOR APPROACH (Right)  Patient location during evaluation: PACU Anesthesia Type: Spinal Level of consciousness: awake and alert and oriented Pain management: pain level controlled Vital Signs Assessment: post-procedure vital signs reviewed and stable Respiratory status: spontaneous breathing, nonlabored ventilation, respiratory function stable and patient connected to nasal cannula oxygen Cardiovascular status: blood pressure returned to baseline and stable Postop Assessment: no headache, no backache, spinal receding and no signs of nausea or vomiting Anesthetic complications: no Comments: Patient with cardiac arrhythmia during procedure. Discussed with Dr. Jacinto Halim, her cardiologist. Will consult for post op cardiovascular management. Discussed with Dr. Veda Canning. Will send patient to [facility] unit. Dr. Jacinto Halim will see patient this pm.       Last Vitals:  Vitals:   06/15/16 1903 06/15/16 1905  BP: 100/69 108/68  Pulse:    Resp:    Temp:      Last Pain:  Vitals:   06/15/16 1326  TempSrc: Oral                 Alylah Blakney A.

## 2016-06-15 NOTE — Consult Note (Signed)
Reason for Consult:Heart Block Referring Physician: Josephine Igo, MD  [name] is an 76 y.o. female.  HPI:  [name] is a pleasant American female with prior history of severe aortic regurgitation and moderately severe mitral regurgitation. She underwent aortic pericardial tissue valve replacement and mitral valve ring placement on 08/12/2011 with bioprosthetic valves by Dr. Gilford Raid. Since then her ejection fraction is normalized.  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 She underwent right hip arthroplasty today, perioperatively and intraoperatively patient was noted to be in episodes of paroxysmal junctional escape rhythm and also episodes of first-degree AV block and marked sinus bradycardia. I was asked to see the patient postoperatively for management.  Patient is awake and alert, underwent surgery via spinal anesthesia. No specific complaints today.  Past Medical History:  Diagnosis Date  . Aortic stenosis   . Arthritis   . Atrial fibrillation [facility])    Status post cardioversion November 2013  . CHF (congestive heart failure) ([place])   . Dysrhythmia   . Hypertension   . Shortness of breath    hx dyspnea and respiratory abnormalities    Past Surgical History:  Procedure Laterality Date  . AORTIC VALVE REPLACEMENT  10/12/2011   Procedure: AORTIC VALVE REPLACEMENT (AVR);  Surgeon: Gaye Pollack, MD;  Location: [place];  Service: Open Heart Surgery;  Laterality: N/A;  . CARDIAC CATHETERIZATION    . CARDIOVERSION  12/26/2011   Procedure: CARDIOVERSION;  Surgeon: Laverda Page, MD;  Location: [place];  Service: Cardiovascular;  Laterality: N/A;  . EXPLORATION POST OPERATIVE OPEN HEART  10/12/2011   Procedure: EXPLORATION POST OPERATIVE OPEN HEART;   Surgeon: Gaye Pollack, MD;  Location: [place] OR;  Service: Open Heart Surgery;  Laterality: N/A;  . LEFT AND RIGHT HEART CATHETERIZATION WITH CORONARY ANGIOGRAM N/A 09/11/2011   Procedure: LEFT AND RIGHT HEART CATHETERIZATION WITH CORONARY ANGIOGRAM;  Surgeon: Laverda Page, MD;  Location: [facility] CATH LAB;  Service: Cardiovascular;  Laterality: N/A;  . MITRAL VALVE REPAIR  10/12/2011   Procedure: MITRAL VALVE REPAIR (MVR);  Surgeon: Gaye Pollack, MD;  Location: [place];  Service: Open Heart Surgery;  Laterality: N/A;  mitral valve annuloplasty    Family History  Problem Relation Age of Onset  . Hypertension Mother     Social History:  reports that she has never smoked. She has never used smokeless tobacco. She reports that she does not drink alcohol or use drugs.  Allergies: No Known Allergies   Dg Pelvis Portable  Result Date: 06/15/2016 CLINICAL DATA:  Right hip arthroplasty EXAM: PORTABLE PELVIS 1-2 VIEWS COMPARISON:  None. FINDINGS: Postop portable view centered over both hips in the AP projection is provided. There is an uncemented right hip arthroplasty. There is protrusio of the acetabulum with irregular, remodeled appearance of the femoral head on the left. No postoperative fracture. There is postoperative subcutaneous emphysema about the right hip and thigh. Alignment appears near anatomic. IMPRESSION: Intact new right hip arthroplasty with uncemented femoral stem. No postoperative fracture nor gross malalignment. 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 Electronically Signed   By: Ashley Royalty M.D.   On: 06/15/2016 19:37   Dg C-arm 1-60 Min-no Report  Result Date: 06/15/2016 Fluoroscopy was utilized by the requesting physician.  No radiographic interpretation.    Review of Systems  Constitutional: Negative.   HENT: Negative.   Eyes: Negative.   Respiratory: Negative.   Cardiovascular: Negative.   Gastrointestinal:  Negative.   Genitourinary: Negative.   Musculoskeletal: Positive for joint pain.  Neurological: Negative.   Endo/Heme/Allergies: Negative.   Psychiatric/Behavioral: Negative.    Blood pressure (!) 86/57, pulse 78, temperature 97.3 F (36.3 C), resp. rate 19, height _0  (1.626 m), weight 59.9 kg (132 lb), SpO2 100 %. Physical Exam  Constitutional: She is oriented to person, place, and time. She appears well-nourished.  HENT:  Head: Atraumatic.  Eyes: Conjunctivae and EOM are normal.  Neck: Neck supple.  Cardiovascular:  Murmur heard. II/VI SEM in the right sternal border. Bilateral faint carotid bruit present. Femoral pulses on the right.defect, postsurgical, left is normal, heel pulses faint bilaterally.  Respiratory: Effort normal.  GI: Soft. Bowel sounds are normal.  Musculoskeletal:  S/P RIght hip arthroplasty  Neurological: She is alert and oriented to person, place, and time.  Skin: Skin is warm. She is not diaphoretic.  Psychiatric: She has a normal mood and affect.   Recent Results (from the past 2160 hour(s))  Type and screen Order type and screen if day of surgery is less than 15 days from draw of preadmission visit or order morning of surgery if day of surgery is greater than 6 days from preadmission visit.     Status: None   Collection Time: 06/12/16  1:30 PM  Result Value Ref Range   ABO/RH(D) A POS    Antibody Screen NEG    Sample Expiration 06/18/2016    Extend sample reason NO TRANSFUSIONS OR PREGNANCY IN THE PAST 3 MONTHS   Basic metabolic panel     Status: Abnormal   Collection Time: 06/12/16  1:32 PM  Result Value Ref Range   Sodium 141 135 - 145 mmol/L   Potassium 4.2 3.5 - 5.1 mmol/L   Chloride 108 101 - 111 mmol/L   CO2 26 22 - 32 mmol/L   Glucose, Bld 99 65 - 99 mg/dL   BUN 27 (H) 6 - 20 mg/dL   Creatinine, Ser 1.17 (H) 0.44 - 1.00 mg/dL   Calcium 11.0 (H) 8.9 - 10.3 mg/dL   GFR calc non Af Amer 44 (L) >60 mL/min   GFR calc Af Amer 51 (L) >60  mL/min    Comment: (NOTE) The eGFR has been calculated using the CKD EPI equation. This calculation has not been validated in all clinical situations. eGFR's persistently <60 mL/min signify possible Chronic Kidney Disease.    Anion gap 7 5 - 15  CBC     Status: Abnormal   Collection Time: 06/12/16  1:32 PM  Result Value Ref Range   WBC 4.5 4.0 - 10.5 K/uL   RBC 3.70 (L) 3.87 - 5.11 MIL/uL   Hemoglobin 11.7 (L) 12.0 - 15.0 g/dL   HCT 35.4 (L) 36.0 - 46.0 %   MCV 95.7 78.0 - 100.0 fL   MCH 31.6 26.0 - 34.0 pg   MCHC 33.1 30.0 - 36.0 g/dL   RDW 14.6 11.5 - 15.5 %   Platelets 138 (L) 150 - 400 K/uL  Surgical pcr screen     Status: None   Collection Time: 06/12/16  1:32 PM  Result Value Ref Range   MRSA, PCR NEGATIVE NEGATIVE   Staphylococcus aureus NEGATIVE NEGATIVE    Comment:        The Xpert SA Assay (FDA approved  for NASAL specimens in patients over 10 years of age), is one component of a comprehensive surveillance program.  Test performance has been validated by [facility] for patients greater than or equal to 72 year old. It is not intended to diagnose infection nor to guide or monitor treatment.   ABO/Rh     Status: None   Collection Time: 06/12/16  1:32 PM  Result Value Ref Range   ABO/RH(D) A POS    Office EKG 03/22/2016: Marked sinus bradycardia with sinus arrhythmia at the rate of 50 bpm, PA-C.  Left atrial abnormality, poor R-wave progression, cannot exclude anterior infarct old.  LVH with repolarization abnormality, cannot exclude lateral ischemia.  Nonspecific T abnormality.  Scheduled Meds: . chlorhexidine  60 mL Topical Once  . chlorhexidine  60 mL Topical Once   Continuous Infusions: . DOPamine    . lactated ringers 0 mL/hr at 06/15/16 1607  . methocarbamol (ROBAXIN)  IV    . phenylephrine (NEO-SYNEPHRINE) Adult infusion Stopped (06/15/16 1923)   PRN Meds:.HYDROmorphone (DILAUDID) injection, meperidine (DEMEROL) injection, methocarbamol **OR**  methocarbamol (ROBAXIN)  IV, promethazine   Assessment/Plan: 1. Asymptomatic marked sinus bradycardia/sinus arrhythmia, episodes of junctional escape. 2. Sick sinus syndrome, again asymptomatic. 3. History of aortic valve replacement and mitral valve ring repair on 10/12/2011, last echocardiogram in the office 09/08/2015 revealing normal LVEF and mild aortic valve stenosis, trace mitral stenosis, moderate pulmonary hypertension. 4. Chronic renal insufficiency stage III secondary to hypertension.  Recommendation: Patient's EKG although abnormal during surgical procedure, she is asymptomatic. She leads a fairly active lifestyle. I would not recommend any further evaluation from cardiac standpoint. Continue present medications, unless she develops hypotension, symptomatic heart block including dizziness or syncope, no indication for pacemaker either. I would not recommend starting any inotropic agents or chronotropic agents including dopamine she has hypertension and was previously on lisinopril, spinal electrode and amlodipine and these may need to be started if her blood pressure remained stable.  Adrian Prows 06/15/2016, 8:10 PM

## 2016-06-15 NOTE — Anesthesia Preprocedure Evaluation (Addendum)
Anesthesia Evaluation  Patient identified by MRN, date of birth, ID band Patient awake    Reviewed: Allergy & Precautions, NPO status , Patient's Chart, lab work & pertinent test results  Airway Mallampati: II  TM Distance: >3 FB Neck ROM: Full    Dental  (+) Partial Lower, Partial Upper, Dental Advisory Given   Pulmonary shortness of breath and with exertion,    Pulmonary exam normal breath sounds clear to auscultation       Cardiovascular hypertension, Pt. on medications +CHF  Normal cardiovascular exam+ dysrhythmias + Valvular Problems/Murmurs AS and MR  Rhythm:Regular Rate:Normal  S/P AVR and MVR   Neuro/Psych negative neurological ROS  negative psych ROS   GI/Hepatic Anasarca   Endo/Other    Renal/GU Renal InsufficiencyRenal disease     Musculoskeletal  (+) Arthritis , Osteoarthritis,  DJD right hip with protrusion   Abdominal   Peds  Hematology  (+) anemia , Thrombocytopenia   Anesthesia Other Findings   Reproductive/Obstetrics                            Anesthesia Physical Anesthesia Plan  ASA: III  Anesthesia Plan: Spinal   Post-op Pain Management:    Induction:   Airway Management Planned: Natural Airway, Nasal Cannula and Simple Face Mask  Additional Equipment:   Intra-op Plan:   Post-operative Plan:   Informed Consent: I have reviewed the patients History and Physical, chart, labs and discussed the procedure including the risks, benefits and alternatives for the proposed anesthesia with the patient or authorized representative who has indicated his/her understanding and acceptance.   Dental advisory given  Plan Discussed with: CRNA, Anesthesiologist and Surgeon  Anesthesia Plan Comments:         Anesthesia Quick Evaluation

## 2016-06-15 NOTE — H&P (View-Only) (Signed)
TOTAL HIP ADMISSION H&P  Patient is admitted for right total hip arthroplasty.  Subjective:  Chief Complaint: right hip pain  HPI: [name], 76 y.o. female, has a history of pain and functional disability in the right hip(s) due to arthritis and patient has failed non-surgical conservative treatments for greater than 12 weeks to include NSAID's and/or analgesics, flexibility and strengthening excercises, use of assistive devices and activity modification.  Onset of symptoms was abrupt starting 1 years ago with rapidlly worsening course since that time.The patient noted no past surgery on the right hip(s).  Patient currently rates pain in the right hip at 10 out of 10 with activity. Patient has night pain, worsening of pain with activity and weight bearing, pain that interfers with activities of daily living, pain with passive range of motion and crepitus. Patient has evidence of subchondral cysts, subchondral sclerosis, joint subluxation and joint space narrowing by imaging studies. This condition presents safety issues increasing the risk of falls. This patient has had AVN with collpase.  There is no current active infection.  Patient Active Problem List   Diagnosis Date Noted  . 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 Aortic regurgitation 10/02/2011  . Anasarca 08/21/2011  . Hypokalemia 08/21/2011  . Diastolic CHF, acute on chronic (HCC) 08/18/2011  . SOB (shortness of breath) 08/18/2011  . Pleural effusion, bilateral 08/18/2011  . Hypoxia 08/18/2011  . Hypertension    Past Medical History:  Diagnosis Date  . Aortic stenosis   . Atrial fibrillation [facility])    Status post cardioversion November 2013  . CHF (congestive heart failure) (HCC)   . Dysrhythmia   . Hypertension   . Shortness of breath    hx dyspnea and respiratory abnormalities    Past Surgical History:  Procedure Laterality Date  . AORTIC VALVE REPLACEMENT  10/12/2011   Procedure: AORTIC VALVE REPLACEMENT (AVR);  Surgeon: Alleen Borne, MD;  Location: [facility] OR;  Service: Open Heart Surgery;  Laterality: N/A;  . CARDIAC CATHETERIZATION    . CARDIOVERSION  12/26/2011   Procedure: CARDIOVERSION;  Surgeon: Pamella Pert, MD;  Location: [facility] ENDOSCOPY;  Service: Cardiovascular;  Laterality: N/A;  . EXPLORATION POST OPERATIVE OPEN HEART  10/12/2011   Procedure: EXPLORATION POST OPERATIVE OPEN HEART;  Surgeon: Alleen Borne, MD;  Location: [facility] OR;  Service: Open Heart Surgery;  Laterality: N/A;  . LEFT AND RIGHT HEART CATHETERIZATION WITH CORONARY ANGIOGRAM N/A 09/11/2011   Procedure: LEFT AND RIGHT HEART CATHETERIZATION WITH CORONARY ANGIOGRAM;  Surgeon: Pamella Pert, MD;  Location[facility] CATH LAB;  Service: Cardiovascular;  Laterality: N/A;  . MITRAL VALVE REPAIR  10/12/2011   Procedure: MITRAL VALVE REPAIR (MVR);  Surgeon: Alleen Borne, MD;  Location: [facility] OR;  Service: Open Heart Surgery;  Laterality: N/A;  mitral valve annuloplasty     (Not in a hospital admission) No Known Allergies  Social History  Substance Use Topics  . Smoking status: Never Smoker  . Smokeless tobacco: Not on file  . Alcohol use No    Family History  Problem Relation Age of Onset  . Hypertension Mother      Review of Systems  Constitutional: Negative.   HENT: Negative.   Eyes: Negative.   Respiratory: Negative.   Cardiovascular: Negative.   Gastrointestinal: Negative.   Genitourinary: Negative.   Musculoskeletal: Positive for back pain and joint pain.  Skin: Negative.   Neurological: Negative.   Endo/Heme/Allergies: Negative.   Psychiatric/Behavioral: Negative.  Objective:  Physical Exam  Vitals reviewed. Constitutional: She is oriented to person, place, and time. She appears well-developed and well-nourished.  HENT:  Head: Normocephalic and atraumatic.  Eyes: EOM are normal. Pupils are equal, round, and reactive to light.  Neck: Normal range of motion. Neck  supple.  Cardiovascular: Normal rate, regular rhythm and intact distal pulses.   Respiratory: Effort normal. No respiratory distress.  GI: Soft. She exhibits no distension.  Genitourinary:  Genitourinary Comments: deferred  Musculoskeletal:       Right hip: She exhibits decreased range of motion, bony tenderness and crepitus.  Neurological: She is alert and oriented to person, place, and time. She has normal reflexes.  Skin: Skin is warm.  Psychiatric: She has a normal mood and affect. Her behavior is normal. Judgment and thought content normal.    Vital signs in last 24 hours: @VSRANGES@  Labs:   Estimated body mass index is 24.41 kg/m as calculated from the following:   Height as of 11/07/12: 5' 3.5" (1.613 m).   Weight as of 11/07/12: 63.5 kg (140 lb).   Imaging Review Plain radiographs demonstrate severe degenerative joint disease of the bilateral hip(s). The bone quality appears to be adequate for age and reported activity level.  Assessment/Plan:  End stage arthritis, right hip(s)  The patient history, physical examination, clinical judgement of the provider and imaging studies are consistent with end stage degenerative joint disease of the right hip(s) and total hip arthroplasty is deemed medically necessary. The treatment options including medical management, injection therapy, arthroscopy and arthroplasty were discussed at length. The risks and benefits of total hip arthroplasty were presented and reviewed. The risks due to aseptic loosening, infection, stiffness, dislocation/subluxation,  thromboembolic complications and other imponderables were discussed.  The patient acknowledged the explanation, agreed to proceed with the plan and consent was signed. Patient is being admitted for inpatient treatment for surgery, pain control, PT, OT, prophylactic antibiotics, VTE prophylaxis, progressive ambulation and ADL's and discharge planning.The patient is planning to be discharged  home with home health services 

## 2016-06-15 NOTE — Progress Notes (Signed)
Pt's BP and HR back and forth with being low and normal.  Dr. Malen Gauze at bedside at least once every 15 minutes until neosynephrine was cut off to check vitals and assess patient.  Once neosynephrine was cut off Dr. Malen Gauze came back to re-evaluate VS/assess pt and deemed her stable for transfer to stepdown unit when bed available.

## 2016-06-15 NOTE — Discharge Instructions (Signed)
°Dr. Mirela Parsley °Joint Replacement Specialist °[facility] °[address] °[place], [place] [address] °[phone] ° ° °TOTAL HIP REPLACEMENT POSTOPERATIVE DIRECTIONS ° ° ° °Hip Rehabilitation, Guidelines Following Surgery  ° °WEIGHT BEARING °Weight bearing as tolerated with assist device (walker, cane, etc) as directed, use it as long as suggested by your surgeon or therapist, typically at least 4-6 weeks. ° °The results of a hip operation are greatly improved after range of motion and muscle strengthening exercises. Follow all safety measures which are given to protect your hip. If any of these exercises cause increased pain or swelling in your joint, decrease the amount until you are comfortable again. Then slowly increase the exercises. Call your caregiver if you have problems or questions.  ° °HOME CARE INSTRUCTIONS  °Most of the following instructions are designed to prevent the dislocation of your new hip.  °Remove items at home which could result in a fall. This includes throw rugs or furniture in walking pathways.  °Continue medications as instructed at time of discharge. °· You may have some home medications which will be placed on hold until you complete the course of blood thinner medication. °· You may start showering once you are discharged home. Do not remove your dressing. °Do not put on socks or shoes without following the instructions of your caregivers.   °Sit on chairs with arms. Use the chair arms to help push yourself up when arising.  °Arrange for the use of a toilet seat elevator so you are not sitting low.  °· Walk with walker as instructed.  °You may resume a sexual relationship in one month or when given the OK by your caregiver.  °Use walker as long as suggested by your caregivers.  °You may put full weight on your legs and walk as much as is comfortable. °Avoid periods of inactivity such as sitting longer than an hour when not asleep. This helps prevent  blood clots.  °You may return to work once you are cleared by your surgeon.  °Do not drive a car for 6 weeks or until released by your surgeon.  °Do not drive while taking narcotics.  °Wear elastic stockings for two weeks following surgery during the day but you may remove then at night.  °Make sure you keep all of your appointments after your operation with all of your doctors and caregivers. You should call the office at the above phone number and make an appointment for approximately two weeks after the date of your surgery. °Please pick up a stool softener and laxative for home use as long as you are requiring pain medications. °· ICE to the affected hip every three hours for 30 minutes at a time and then as needed for pain and swelling. Continue to use ice on the hip for pain and swelling from surgery. You may notice swelling that will progress down to the foot and ankle.  This is normal after surgery.  Elevate the leg when you are not up walking on it.   °It is important for you to complete the blood thinner medication as prescribed by your doctor. °· Continue to use the breathing machine which will help keep your temperature down.  It is common for your temperature to cycle up and down following surgery, especially at night when you are not up moving around and exerting yourself.  The breathing machine keeps your lungs expanded and your temperature down. ° °RANGE OF MOTION AND STRENGTHENING EXERCISES  °These exercises are   designed to help you keep full movement of your hip joint. Follow your caregiver's or physical therapist's instructions. Perform all exercises about fifteen times, three times per day or as directed. Exercise both hips, even if you have had only one joint replacement. These exercises can be done on a training (exercise) mat, on the floor, on a table or on a bed. Use whatever works the best and is most comfortable for you. Use music or television while you are exercising so that the exercises  are a pleasant break in your day. This will make your life better with the exercises acting as a break in routine you can look forward to.  °Lying on your back, slowly slide your foot toward your buttocks, raising your knee up off the floor. Then slowly slide your foot back down until your leg is straight again.  °Lying on your back spread your legs as far apart as you can without causing discomfort.  °Lying on your side, raise your upper leg and foot straight up from the floor as far as is comfortable. Slowly lower the leg and repeat.  °Lying on your back, tighten up the muscle in the front of your thigh (quadriceps muscles). You can do this by keeping your leg straight and trying to raise your heel off the floor. This helps strengthen the largest muscle supporting your knee.  °Lying on your back, tighten up the muscles of your buttocks both with the legs straight and with the knee bent at a comfortable angle while keeping your heel on the floor.  ° °SKILLED REHAB INSTRUCTIONS: °If the patient is transferred to a skilled rehab facility following release from the hospital, a list of the current medications will be sent to the facility for the patient to continue.  When discharged from the skilled rehab facility, please have the facility set up the patient's Home Health Physical Therapy prior to being released. Also, the skilled facility will be responsible for providing the patient with their medications at time of release from the facility to include their pain medication and their blood thinner medication. If the patient is still at the rehab facility at time of the two week follow up appointment, the skilled rehab facility will also need to assist the patient in arranging follow up appointment in our office and any transportation needs. ° °MAKE SURE YOU:  °Understand these instructions.  °Will watch your condition.  °Will get help right away if you are not doing well or get worse. ° °Pick up stool softner and  laxative for home use following surgery while on pain medications. °Do not remove your dressing. °The dressing is waterproof--it is OK to take showers. °Continue to use ice for pain and swelling after surgery. °Do not use any lotions or creams on the incision until instructed by your surgeon. °Total Hip Protocol. ° ° °

## 2016-06-15 NOTE — Progress Notes (Signed)
Report given to Trinna Post, RN in [place] unit.  Patient's son, Kathlene November, called and given update that patient will be transferred to room 1231.  Mike's home number is [phone], and cell number is [phone]

## 2016-06-15 NOTE — Op Note (Signed)
OPERATIVE REPORT  SURGEON: Samson Frederic, MD   ASSISTANT: Staff.  PREOPERATIVE DIAGNOSIS: Right hip arthritis with protrusio deformity.   POSTOPERATIVE DIAGNOSIS: Right hip arthritis with protrusio deformity.   PROCEDURE: Right total hip arthroplasty, anterior approach.   IMPLANTS: DePuy Tri Lock stem, size 8, standard offset. DePuy Pinnacle Cup, size 54 mm. DePuy Altrx liner, size 32 by 54 mm. DePuy Biolox ceramic head ball, size 32 + 1 mm. 6.5 mm cancellous bone screw x1.  ANESTHESIA:  Spinal  ESTIMATED BLOOD LOSS: 600 mL.  ANTIBIOTICS: 2 g Ancef.  DRAINS: None.  COMPLICATIONS: None.   CONDITION: PACU - hemodynamically stable.Marland Kitchen   BRIEF CLINICAL NOTE: [name] is a 76 y.o. female with a long-standing history of Right hip arthritis. After failing conservative management, the patient was indicated for total hip arthroplasty. The risks, benefits, and alternatives to the procedure were explained, and the patient elected to proceed.  PROCEDURE IN DETAIL: Surgical site was marked by myself in the pre-op holding area. Once inside the operative room, spinal anesthesia was obtained, and a foley catheter was inserted. The patient was then positioned on the Hana table. All bony prominences were well padded. The hip was prepped and draped in the normal sterile surgical fashion. A time-out was called verifying side and site of surgery. The patient received IV antibiotics within 60 minutes of beginning the procedure.  The direct anterior approach to the hip was performed through the Hueter interval. Lateral femoral circumflex vessels were treated with the Auqumantys. The anterior capsule was exposed and an inverted T capsulotomy was made.The femoral neck cut was made to the level of the templated cut. A corkscrew was placed into the head and the head was removed. The femoral head was found to have eburnated bone. The head was passed to the back table and was  measured.  Acetabular exposure was achieved, and the pulvinar and labrum were excised. There was a protrusio deformity with a narrow introitus. I carefully debrided the soft tissue from the medial wall of the acetabulum using a 43 mm reamer. Sequental reaming of the acetabulum was then performed up to a size 53 mm reamer, taking care to keep the hip center low and lateral. No bone grafting was necessary. A 54 mm cup was then opened and impacted into place at approximately 40 degrees of abduction and 20 degrees of anteversion. The cup achieved excellent press fit, and I chose to augment the fixation with a 6.5 mm cancellous screw. The final polyethylene liner was impacted into place and acetabular osteophytes were removed.   I then gained femoral exposure taking care to protect the abductors and greater trochanter. This was performed using standard external rotation, extension, and adduction. The capsule was peeled off the inner aspect of the greater trochanter, taking care to preserve the short external rotators. A cookie cutter was used to enter the femoral canal, and then the femoral canal finder was placed. Sequential broaching was performed up to a size 8. Calcar planer was used on the femoral neck remnant. I paced a standard offset neck and a trial head ball. The hip was reduced. Leg lengths and offset were checked fluoroscopically. The hip was dislocated and trial components were removed. The final implants were placed, and the hip was reduced.  Fluoroscopy was used to confirm component position and leg lengths. At 90 degrees of external rotation and full extension, the hip was stable to an anterior directed force.  The wound was copiously irrigated with a dilute  betadine solution followed by normal saline. Marcaine solution was injected into the periarticular soft tissue. The wound was closed in layers using #1 Stratafix for the fascia, 2-0 Vicryl for the subcutaneous fat, 2-0 Monocryl for  the deep dermal layer, 3-0 running Monocryl subcuticular stitch, and Dermabond for the skin. Once the glue was fully dried, an Aquacell Ag dressing was applied. The patient was transported to the recovery room in stable condition. Sponge, needle, and instrument counts were correct at the end of the case x2. The patient tolerated the procedure well and there were no known complications.

## 2016-06-15 NOTE — Transfer of Care (Signed)
Immediate Anesthesia Transfer of Care Note  Patient: [name]  Procedure(s) Performed: Procedure(s) with comments: RIGHT TOTAL HIP ARTHROPLASTY ANTERIOR APPROACH (Right) - Dr. needs RNFA  Patient Location: PACU  Anesthesia Type:Spinal  Level of Consciousness: awake and alert   Airway & Oxygen Therapy: Patient Spontanous Breathing and Patient connected to face mask oxygen  Post-op Assessment: Report given to RN and Post -op Vital signs reviewed and stable  Post vital signs: Reviewed and stable  Last Vitals:  Vitals:   06/15/16 1326  BP: 119/81  Pulse: 80  Resp: 18  Temp: 37 C    Last Pain:  Vitals:   06/15/16 1326  TempSrc: Oral      Patients Stated Pain Goal: 2 (06/15/16 1351)  Complications: No apparent anesthesia complications

## 2016-06-15 NOTE — Anesthesia Procedure Notes (Signed)
Spinal  Patient location during procedure: OR Start time: 06/15/2016 3:12 PM Staffing Anesthesiologist: Mal Amabile Performed: anesthesiologist  Preanesthetic Checklist Completed: patient identified, site marked, surgical consent, pre-op evaluation, timeout performed, IV checked, risks and benefits discussed and monitors and equipment checked Spinal Block Patient position: sitting Prep: site prepped and draped and DuraPrep Patient monitoring: heart rate, cardiac monitor, continuous pulse ox and blood pressure Approach: midline Location: L3-4 Injection technique: single-shot Needle Needle type: Sprotte  Needle gauge: 24 G Needle length: 9 cm Needle insertion depth: 6 cm Assessment Sensory level: T6 Additional Notes Patient tolerated procedure well. Adequate sensory level.

## 2016-06-15 NOTE — Interval H&P Note (Signed)
History and Physical Interval Note:  06/15/2016 2:29 PM  [name]  has presented today for surgery, with the diagnosis of Degenerative joint disease right hip with protrusio  The various methods of treatment have been discussed with the patient and family. After consideration of risks, benefits and other options for treatment, the patient has consented to  Procedure(s) with comments: RIGHT TOTAL HIP ARTHROPLASTY ANTERIOR APPROACH (Right) - Dr. needs RNFA as a surgical intervention .  The patient's history has been reviewed, patient examined, no change in status, stable for surgery.  I have reviewed the patient's chart and labs.  Questions were answered to the patient's satisfaction.     Shaquanda Graves, [name]

## 2016-06-16 ENCOUNTER — Encounter (HOSPITAL_COMMUNITY): Payer: Self-pay | Admitting: Orthopedic Surgery

## 2016-06-16 LAB — BASIC METABOLIC PANEL
Anion gap: 7 (ref 5–15)
BUN: 16 mg/dL (ref 6–20)
CALCIUM: 9 mg/dL (ref 8.9–10.3)
CHLORIDE: 110 mmol/L (ref 101–111)
CO2: 21 mmol/L — AB (ref 22–32)
CREATININE: 1.02 mg/dL — AB (ref 0.44–1.00)
GFR calc non Af Amer: 52 mL/min — ABNORMAL LOW (ref >60)
Glucose, Bld: 205 mg/dL — ABNORMAL HIGH (ref 65–99)
Potassium: 4.4 mmol/L (ref 3.5–5.1)
SODIUM: 138 mmol/L (ref 135–145)

## 2016-06-16 LAB — TROPONIN I
Troponin I: <0.03 ng/mL (ref <0.03)
Troponin I: <0.03 ng/mL (ref <0.03)
Troponin I: <0.03 ng/mL (ref <0.03)

## 2016-06-16 LAB — CBC
HCT: 22.6 % — ABNORMAL LOW (ref 36.0–46.0)
Hemoglobin: 7.5 g/dL — ABNORMAL LOW (ref 12.0–15.0)
MCH: 31.8 pg (ref 26.0–34.0)
MCHC: 33.2 g/dL (ref 30.0–36.0)
MCV: 95.8 fL (ref 78.0–100.0)
Platelets: 89 10*3/uL — ABNORMAL LOW (ref 150–400)
RBC: 2.36 MIL/uL — ABNORMAL LOW (ref 3.87–5.11)
RDW: 14.4 % (ref 11.5–15.5)
WBC: 8.9 10*3/uL (ref 4.0–10.5)

## 2016-06-16 LAB — GLUCOSE, CAPILLARY: Glucose-Capillary: 195 mg/dL — ABNORMAL HIGH (ref 65–99)

## 2016-06-16 LAB — HEMOGLOBIN AND HEMATOCRIT, BLOOD
HEMATOCRIT: 31.5 % — AB (ref 36.0–46.0)
HEMOGLOBIN: 10.8 g/dL — AB (ref 12.0–15.0)

## 2016-06-16 LAB — PREPARE RBC (CROSSMATCH)

## 2016-06-16 LAB — MRSA PCR SCREENING: MRSA by PCR: NEGATIVE

## 2016-06-16 MED ORDER — FUROSEMIDE 10 MG/ML IJ SOLN: 40.0000 mg | Freq: Once | INTRAMUSCULAR | Status: DC

## 2016-06-16 MED ORDER — DOPAMINE-DEXTROSE 3.2-5 MG/ML-% IV SOLN
0.0000 ug/kg/min | INTRAVENOUS | Status: DC
  Administered 2016-06-16: 3 ug/kg/min via INTRAVENOUS

## 2016-06-16 MED ORDER — ASPIRIN 81 MG PO CHEW: 81.0000 mg | CHEWABLE_TABLET | Freq: Two times a day (BID) | ORAL | 1 refills | Status: DC

## 2016-06-16 MED ORDER — SENNA 8.6 MG PO TABS: 2.0000 | ORAL_TABLET | Freq: Every day | ORAL | 0 refills | Status: DC

## 2016-06-16 MED ORDER — DOCUSATE SODIUM 100 MG PO CAPS: 100.0000 mg | ORAL_CAPSULE | Freq: Two times a day (BID) | ORAL | 1 refills | Status: DC

## 2016-06-16 MED ORDER — SODIUM CHLORIDE 0.9 % IV SOLN: Freq: Once | INTRAVENOUS | Status: DC

## 2016-06-16 MED ORDER — SODIUM CHLORIDE 0.9 % IV BOLUS (SEPSIS)
250.0000 mL | Freq: Once | INTRAVENOUS | Status: AC
  Administered 2016-06-16: 250 mL via INTRAVENOUS

## 2016-06-16 MED ORDER — ATROPINE SULFATE 1 MG/10ML IJ SOSY
PREFILLED_SYRINGE | INTRAMUSCULAR | Status: AC
  Filled 2016-06-16: qty 20

## 2016-06-16 MED ORDER — HYDROCODONE-ACETAMINOPHEN 5-325 MG PO TABS: 1.0000 | ORAL_TABLET | ORAL | 0 refills | Status: DC | PRN

## 2016-06-16 MED ORDER — ONDANSETRON HCL 4 MG PO TABS: 4.0000 mg | ORAL_TABLET | Freq: Four times a day (QID) | ORAL | 0 refills | Status: DC | PRN

## 2016-06-16 MED ORDER — PNEUMOCOCCAL VAC POLYVALENT 25 MCG/0.5ML IJ INJ
0.5000 mL | INJECTION | INTRAMUSCULAR | Status: AC
  Administered 2016-06-17: 0.5 mL via INTRAMUSCULAR
  Filled 2016-06-16: qty 0.5

## 2016-06-16 MED ORDER — ORAL CARE MOUTH RINSE
15.0000 mL | Freq: Two times a day (BID) | OROMUCOSAL | Status: DC
  Administered 2016-06-16 – 2016-06-18 (×5): 15 mL via OROMUCOSAL

## 2016-06-16 NOTE — Progress Notes (Signed)
Subjective:  Patient had continued hypotension since postop, due to bradycardia and hypotension, was started on dopamine. She is presently on 1 g of dopamine. Denies any chest pain or shortness of breath. Complains of right hip pain but states that it is very minimal. Moves all 4 extremities.  Objective:  Vital Signs in the last 24 hours: Temp:  [96.8 F (36 C)-98.6 F (37 C)] 97.1 F (36.2 C) (04/27 0635) Pulse Rate:  [36-86] 77 (04/27 0800) Resp:  [8-23] 20 (04/27 0800) BP: (71-140)/(36-90) 100/67 (04/27 0800) SpO2:  [92 %-100 %] 98 % (04/27 0800) Weight:  [59.9 kg (132 lb)-62.3 kg (137 lb 5.6 oz)] 62.3 kg (137 lb 5.6 oz) (04/26 2320)  Intake/Output from previous day: 04/26 0701 - 04/27 0700 In: 3110 [I.V.:1852.5; Blood:347.5; IV Piggyback:910] Out: 1795 [Urine:1195; Blood:600]  Physical Exam:  Blood pressure 100/67, pulse 77, temperature 97.1 F (36.2 C), temperature source Oral, resp. rate 20, height  (1.626 m), weight 62.3 kg (137 lb 5.6 oz), SpO2 98 %.  General appearance: alert, cooperative, appears stated age, no distress and petite Eyes: negative findings: lids and lashes normal Neck: no adenopathy, no carotid bruit, no JVD, supple, symmetrical, trachea midline and thyroid not enlarged, symmetric, no tenderness/mass/nodules Neck: JVP - normal, carotids 2+= without bruits Resp: clear to auscultation bilaterally Chest wall: no tenderness Cardio: S1, S2 normal, no S3 or S4 and systolic murmur: early systolic 3/6, crescendo at 2nd right intercostal space, at apex GI: soft, non-tender; bowel sounds normal; no masses,  no organomegaly Extremities: extremities normal, atraumatic, no cyanosis or edema and Right hip arthroplasty site without obvious hematoma. Moves all 4 extremities.    Lab Results: BMP  Recent Labs  06/12/16 1332 06/16/16 0203  NA 141 138  K 4.2 4.4  CL 108 110  CO2 26 21*  GLUCOSE 99 205*  BUN 27* 16  CREATININE 1.17* 1.02*  CALCIUM 11.0*  9.0  GFRNONAA 44* 52*  GFRAA 51* >60    CBC  Recent Labs Lab 06/16/16 0203  WBC 8.9  RBC 2.36*  HGB 7.5*  HCT 22.6*  PLT 89*  MCV 95.8  MCH 31.8  MCHC 33.2  RDW 14.4    HEMOGLOBIN A1C Lab Results  Component Value Date   HGBA1C 6.3 (H) 10/10/2011   MPG 134 (H) 10/10/2011    Cardiac Panel (last 3 results)  Recent Labs  06/16/16 0417  TROPONINI <0.03    BNP (last 3 results) No results for input(s): PROBNP in the last 8760 hours.  TSH No results for input(s): TSH in the last 8760 hours.  Lipid Panel     Component Value Date/Time   CHOL 141 08/22/2011 0635   TRIG 72 08/22/2011 0635   HDL 67 08/22/2011 0635   CHOLHDL 2.1 08/22/2011 0635   VLDL 14 08/22/2011 0635   LDLCALC 60 08/22/2011 0635     Hepatic Function Panel No results for input(s): PROT, ALBUMIN, AST, ALT, ALKPHOS, BILITOT, BILIDIR, IBILI in the last 8760 hours.  Imaging: Imaging results have been reviewed  Cardiac Studies:  EKG 06/15/2016: Sinus rhythm with first-degree AV block, right upper Mattie, left axis deviation, left anterior fascicular block. Nonspecific T abnormality. PAC.  ECHO: Pending  Assessment/Plan:  1. 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 Present hypotension is probably due to blood loss from right hip arthroplasty. Agree with blood transfusion. 2. Sick sinus syndrome, again asymptomatic. 3. 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 4. Chronic renal insufficiency stage III secondary to hypertension.  Recommendation: Patient has history of hypertension and was previously on lisinopril and amlodipine. This has been held due to hypotension. She is presently on 1 g of dopamine which I suspect can be weaned  off. I will obtain Hb/HCT after transfusion to establish a baseline and repeat CBC and a BMP in the morning. Please call if questions, suspect she'll do well. Echocardiogram pending. Expect renal function to deteriorate due to hypotension through the night. But this should resolve.   Yates Decamp, M.D. 06/16/2016, 8:49 AM [facility] Pager: [phone] Office: [phone] If no answer: [phone]

## 2016-06-16 NOTE — Progress Notes (Signed)
Physical Therapy Treatment Patient Details Name: [name] MRN: [id number] DOB: 1941-01-26 Today's Date: 06/16/2016    History of Present Illness Pt s/p R THR and transferred to step down post op 2* HR/BP issues.  Pt with hx of CHF, A-fib and Aortic valve replacement.      PT Comments     Pt motivated but fatigues easily and requiring increased time for all tasks.  Follow Up Recommendations  Home health PT     Equipment Recommendations  None recommended by PT    Recommendations for Other Services OT consult     Precautions / Restrictions Precautions Precautions: Fall;Other (comment) Precaution Comments: NO ACTIVE ABDUCTION Restrictions Weight Bearing Restrictions: No Other Position/Activity Restrictions: WBAT    Mobility  Bed Mobility Overal bed mobility: Needs Assistance Bed Mobility: Sit to Supine     Supine to sit: Min assist;Mod assist Sit to supine: Min assist;Mod assist;+2 for physical assistance;+2 for safety/equipment   General bed mobility comments: increased time with cues for sequence and use of L LE to self assist  Transfers Overall transfer level: Needs assistance Equipment used: Rolling walker (2 wheeled) Transfers: Sit to/from Stand Sit to Stand: Min assist;Mod assist         General transfer comment: cues for LE management and use of UEs to self assist  Ambulation/Gait Ambulation/Gait assistance: Min assist;+2 safety/equipment Ambulation Distance (Feet): 50 Feet Assistive device: Rolling walker (2 wheeled) Gait Pattern/deviations: Step-to pattern;Step-through pattern;Decreased step length - right;Decreased step length - left;Shuffle;Trunk flexed Gait velocity: decr Gait velocity interpretation: Below normal speed for age/gender General Gait Details: cues for sequence, posture and position from [employer]            Wheelchair Mobility    Modified Rankin (Stroke Patients Only)       Balance                                            Cognition Arousal/Alertness: Awake/alert Behavior During Therapy: WFL for tasks assessed/performed Overall Cognitive Status: Within Functional Limits for tasks assessed                                        Exercises Total Joint Exercises Ankle Circles/Pumps: AROM;Both;15 reps;Supine Quad Sets: AROM;Both;10 reps;Supine Heel Slides: AAROM;Right;20 reps;Supine    General Comments        Pertinent Vitals/Pain Pain Assessment: Faces Faces Pain Scale: Hurts little more Pain Location: R hip Pain Descriptors / Indicators: Aching;Sore Pain Intervention(s): Limited activity within patient's tolerance;Monitored during session;Premedicated before session    Home Living Family/patient expects to be discharged to:: Private residence Living Arrangements: Alone Available Help at Discharge: Family;Available 24 hours/day Type of Home: House Home Access: Stairs to enter Entrance Stairs-Rails: None Home Layout: One level Home Equipment: Toilet riser;Walker - 2 wheels Additional Comments: son from [place] will stay initially.  He bought her a toilet riser; she is unsure if it has rails.  She has a tub and has been sponge bathing.  Normally she gets to bottom of tub for bathing    Prior Function Level of Independence: Independent;Independent with assistive device(s)      Comments: Used RW prior to surgery 2* hip pain   PT Goals (current goals can now be found in the care  plan section) Acute Rehab PT Goals Patient Stated Goal: Get back to walking PT Goal Formulation: With patient Time For Goal Achievement: 06/22/16 Potential to Achieve Goals: Good Progress towards PT goals: Progressing toward goals    Frequency    7X/week      PT Plan Current plan remains appropriate    Co-evaluation PT/OT/SLP Co-Evaluation/Treatment: Yes Reason for Co-Treatment: For patient/therapist safety PT goals addressed during session: Mobility/safety with  mobility OT goals addressed during session: ADL's and self-care     End of Session Equipment Utilized During Treatment: Gait belt Activity Tolerance: Patient limited by fatigue;Patient limited by pain Patient left: in bed;with call bell/phone within reach;with bed alarm set Nurse Communication: Mobility status PT Visit Diagnosis: Unsteadiness on feet (R26.81);Difficulty in walking, not elsewhere classified (R26.2)     Time: 1430-1501 PT Time Calculation (min) (ACUTE ONLY): 31 min  Charges:  $Gait Training: 8-22 mins                    G Codes:       Pg [phone]    Leilynn Pilat 06/16/2016, 3:25 PM

## 2016-06-16 NOTE — Progress Notes (Signed)
At 0015 called [facility] answering service to talk to PA about patient's low BP 74/47(54); PA ordered 250 mL bolus.  Called back at 0045 after bolus given. BP 81/52 (58). PA ordered another 250 mL bolus.  PA said MAP > 60 is fine and not to worry about systolic BP.  Called back at 0115; BP 74/48 (56); PA said to put in STAT H&H to see if patient needs blood.  Called PA to report hemoglobin of 7.5 (down from 11.7). He ordered two units of packed RBC with 40 mg of lasix in between.  Right after blood transfusion started patient's heart rate dropped to 38;  She sustained in the 40s for a few minutes before returning to mid 70s. 12-lead EKG down. Orthopedic PA called and he consulted CCMD. He said to finish current unit of blood but hold second unit and lasix.  This RN also paged the patient's cardiologist.  Cardiologist ordered an echocardiogram. If after blood transfusion SBP < 90 and MAP < 65 RN is to give a 500 mL NS bolus. He reccommended a dopamine drip if the fluid bolus did not bring the BP up.   At 0545 pt bradycardic again and BP 78/37(48). Called cardiology for dopamine drip.  Dopamine drip started. Will continue to monitor.

## 2016-06-16 NOTE — Progress Notes (Signed)
   Subjective:  Patient reports pain as mild to moderate.  Cardiology consulted for arrhythmia and hypotension. 2 units PRBCs ordered for anemia. Currently on low dose dopamine gtt.  Objective:   VITALS:   Vitals:   06/16/16 0600 06/16/16 0605 06/16/16 0615 06/16/16 0635  BP: (!) 98/46 (!) 98/46 140/76 124/70  Pulse: 71 (!) 43 (!) 57 86  Resp: Temp: (!) 96.8 F (36 C) (!) 96.8 F (36 C)  97.1 F (36.2 C)  TempSrc: Axillary Axillary  Oral  SpO2: 100% 100% 100% 98%  Weight:      Height:        NAD ABD SNTND RLE: dressing c/d/I. (+) TA/GS/EHL. 2+ DP. SILT.  Lab Results  Component Value Date   WBC 8.9 06/16/2016   HGB 7.5 (L) 06/16/2016   HCT 22.6 (L) 06/16/2016   MCV 95.8 06/16/2016   PLT 89 (L) 06/16/2016   BMET    Component Value Date/Time   NA 138 06/16/2016 0203   K 4.4 06/16/2016 0203   CL 110 06/16/2016 0203   CO2 21 (L) 06/16/2016 0203   GLUCOSE 205 (H) 06/16/2016 0203   BUN 16 06/16/2016 0203   CREATININE 1.02 (H) 06/16/2016 0203   CALCIUM 9.0 06/16/2016 0203   GFRNONAA 52 (L) 06/16/2016 0203   GFRAA >60 06/16/2016 0203     Assessment/Plan: 1 Day Post-Op   Principal Problem:   Arthropathy of right hip   A/P: s/p complex R THA WBAT with walker DVT ppx: ASA, SCDs, TEDs ABLA: 2 units PRBCs ordered, monitor Arrhythmia: appreciate cardiology input Hypotension: related to ABLA and arrhthymia, wean dopamine gtt PT/OT Dispo: recheck labs in am, plan to d/c home with HHPT when medically stable   [name], [name] 06/16/2016, 6:41 AM   Samson Frederic, MD Cell [phone]

## 2016-06-16 NOTE — Discharge Summary (Signed)
Physician Discharge Summary  Patient ID: [name] MRN: [id number] DOB/AGE: 03/23/1940 76 y.o.  Admit date: 06/15/2016 Discharge date: 06/20/2016  Admission Diagnoses:  Arthropathy of right hip  Discharge Diagnoses:  Principal Problem:   Arthropathy of right hip   Past Medical History:  Diagnosis Date  . Aortic stenosis   . Arthritis   . Atrial fibrillation [facility])    Status post cardioversion November 2013  . CHF (congestive heart failure) (HCC)   . Dysrhythmia   . Hypertension   . Shortness of breath    hx dyspnea and respiratory abnormalities    Surgeries: Procedure(s): RIGHT TOTAL HIP ARTHROPLASTY ANTERIOR APPROACH on 06/15/2016   Consultants (if any):   Discharged Condition: Improved  Hospital Course: [name] is an 76 y.o. female who was admitted 06/15/2016 with a diagnosis of Arthropathy of right hip and went to the operating room on 06/15/2016 and underwent the above named procedures.    She was given perioperative antibiotics:  Anti-infectives    Start     Dose/Rate Route Frequency Ordered Stop   06/16/16 0600  ceFAZolin (ANCEF) IVPB 2g/100 mL premix     2 g 200 mL/hr over 30 Minutes Intravenous On call to O.R. 06/15/16 1315 06/15/16 1544   06/15/16 2245  ceFAZolin (ANCEF) IVPB 1 g/50 mL premix     1 g 100 mL/hr over 30 Minutes Intravenous Every 6 hours 06/15/16 2239 06/16/16 0939    .  She was given sequential compression devices, early ambulation, and ASA for DVT prophylaxis.  Cardiology was consulted for intraop arrhythmia. On the night of POD#0, she had hypotension and was transfused 2 units PRBcs for hgb of 7.5. She required low dose dopamine drip to maintain blood pressure. Dopamine drip was weaned and patient did well. Cardiology discontinued her amlodipine, lasix, and lisinopril. She progressed with therapy and was declared stable for discharge home.  Recent vital signs:  Vitals:   06/18/16 2055 06/19/16 0431  BP: 99/65 130/70  Pulse:  (!) 52 69  Resp: 18 18  Temp: 98.5 F (36.9 C) 98.3 F (36.8 C)    Recent laboratory studies:  Lab Results  Component Value Date   HGB 9.6 (L) 06/18/2016   HGB 9.9 (L) 06/17/2016   HGB 10.8 (L) 06/16/2016   Lab Results  Component Value Date   WBC 9.2 06/18/2016   PLT 79 (L) 06/18/2016   Lab Results  Component Value Date   INR 1.17 06/24/2012   Lab Results  Component Value Date   NA 135 06/18/2016   K 4.5 06/18/2016   CL 104 06/18/2016   CO2 28 06/18/2016   BUN 25 (H) 06/18/2016   CREATININE 1.00 06/18/2016   GLUCOSE 110 (H) 06/18/2016    Discharge Medications:   Allergies as of 06/19/2016   No Known Allergies     Medication List    STOP taking these medications   amLODipine 10 MG tablet Commonly known as:  NORVASC   aspirin 325 MG tablet Replaced by:  aspirin 81 MG chewable tablet   furosemide 20 MG tablet Commonly known as:  LASIX   lisinopril 2.5 MG tablet Commonly known as:  PRINIVIL,ZESTRIL     TAKE these medications   aspirin 81 MG chewable tablet Chew 1 tablet (81 mg total) by mouth 2 (two) times daily. Replaces:  aspirin 325 MG tablet   docusate sodium 100 MG capsule Commonly known as:  COLACE Take 1 capsule (100 mg total) by mouth 2 (two) times  daily.   HYDROcodone-acetaminophen 5-325 MG tablet Commonly known as:  NORCO/VICODIN Take 1-2 tablets by mouth every 4 (four) hours as needed (breakthrough pain).   multivitamin with minerals Tabs tablet Take 1 tablet by mouth daily.   MUSCLE RUB 10-15 % Crea Apply 1 application topically 4 (four) times daily as needed (for hip pain.).   naproxen sodium 220 MG tablet Commonly known as:  ANAPROX Take 440 mg by mouth 2 (two) times daily as needed (for pain.).   ondansetron 4 MG tablet Commonly known as:  ZOFRAN Take 1 tablet (4 mg total) by mouth every 6 (six) hours as needed for nausea.   senna 8.6 MG Tabs tablet Commonly known as:  SENOKOT Take 2 tablets (17.2 mg total) by mouth at  bedtime.   spironolactone 25 MG tablet Commonly known as:  ALDACTONE Take 25 mg by mouth daily.   vitamin C 500 MG tablet Commonly known as:  ASCORBIC ACID Take 500 mg by mouth daily.       Diagnostic Studies: Dg Pelvis Portable  Result Date: 06/15/2016 CLINICAL DATA:  Right hip arthroplasty EXAM: PORTABLE PELVIS 1-2 VIEWS COMPARISON:  None. FINDINGS: Postop portable view centered over both hips in the AP projection is provided. There is an uncemented right hip arthroplasty. There is protrusio of the acetabulum with irregular, remodeled appearance of the femoral head on the left. No postoperative fracture. There is postoperative subcutaneous emphysema about the right hip and thigh. Alignment appears near anatomic. IMPRESSION: Intact new right hip arthroplasty with uncemented femoral stem. No postoperative fracture nor gross malalignment. 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 Electronically Signed   By: Tollie Eth M.D.   On: 06/15/2016 19:37   Dg C-arm 1-60 Min-no Report  Result Date: 06/15/2016 Fluoroscopy was utilized by the requesting physician.  No radiographic interpretation.    Disposition: 01-Home or Self Care  Discharge Instructions    Call MD / Call 911    Complete by:  As directed    If you experience chest pain or shortness of breath, CALL 911 and be transported to the hospital emergency room.  If you develope a fever above 101 F, pus (white drainage) or increased drainage or redness at the wound, or calf pain, call your surgeon's office.   Constipation Prevention    Complete by:  As directed    Drink plenty of fluids.  Prune juice may be helpful.  You may use a stool softener, such as Colace (over the counter) 100 mg twice a day.  Use MiraLax (over the counter) for constipation as needed.   Diet - low sodium heart healthy    Complete by:  As directed    Driving restrictions    Complete by:  As directed    No  driving for 6 weeks   Increase activity slowly as tolerated    Complete by:  As directed    Lifting restrictions    Complete by:  As directed    No lifting for 6 weeks   TED hose    Complete by:  As directed    Use stockings (TED hose) for 2 weeks on both leg(s).  You may remove them at night for sleeping.      Follow-up Information    Debbera Wolken, [name], MD. Schedule an appointment as soon as possible for a visit in 2 week(s).   Specialty:  Orthopedic Surgery Why:  For wound re-check Contact information: [address] [place] [address] [phone]        Yates Decamp, MD. Call.   Specialty:  Cardiology Why:  TO be seen in 1 week to 10 days post hospital stay Contact information: [address] [place] [address] [phone]        [facility] Follow up.   Specialty:  Home Health Services Why:  [facility] physical therapy/occupational therapy Contact information: [address] [place] [address] [phone]            Signed: Garnet Koyanagi 06/20/2016, 5:09 PM

## 2016-06-16 NOTE — Evaluation (Signed)
Physical Therapy Evaluation Patient Details Name: [name] MRN: [id number] DOB: Sep 14, 1940 Today's Date: 06/16/2016   History of Present Illness  Pt s/p R THR and transferred to step down post op 2* HR/BP issues.  Pt with hx of CHF, A-fib and Aortic valve replacement.    Clinical Impression  Pt s/p R THR and presents with decreased R LE strength/ROM and post op pain limiting functional mobility.  Pt should progress to dc home with family assist and would greatly benefit from follow up HHPT.    Follow Up Recommendations Home health PT    Equipment Recommendations  None recommended by PT    Recommendations for Other Services OT consult     Precautions / Restrictions Precautions Precautions: Fall;Other (comment) Precaution Comments: NO ACTIVE ABDUCTION Restrictions Weight Bearing Restrictions: No Other Position/Activity Restrictions: WBAT      Mobility  Bed Mobility Overal bed mobility: Needs Assistance Bed Mobility: Supine to Sit     Supine to sit: Min assist;Mod assist     General bed mobility comments: increased time with cues for sequence and use of L LE to self assist  Transfers Overall transfer level: Needs assistance Equipment used: Rolling walker (2 wheeled) Transfers: Sit to/from Stand Sit to Stand: Min assist;Mod assist         General transfer comment: cues for LE management and use of UEs to self assist  Ambulation/Gait Ambulation/Gait assistance: Min assist;Mod assist;+2 safety/equipment Ambulation Distance (Feet): 14 Feet Assistive device: Rolling walker (2 wheeled) Gait Pattern/deviations: Step-to pattern;Decreased step length - right;Decreased step length - left;Shuffle;Decreased stance time - right;Trunk flexed Gait velocity: decr Gait velocity interpretation: Below normal speed for age/gender General Gait Details: cues for sequence, posture and position from RW - distance pain ltd  Stairs            Wheelchair Mobility     Modified Rankin (Stroke Patients Only)       Balance                                             Pertinent Vitals/Pain Pain Assessment: Faces Faces Pain Scale: Hurts even more Pain Location: R hip Pain Descriptors / Indicators: Aching;Sore Pain Intervention(s): Limited activity within patient's tolerance;Monitored during session;Patient requesting pain meds-RN notified    Home Living Family/patient expects to be discharged to:: Private residence Living Arrangements: Alone Available Help at Discharge: Family;Available 24 hours/day (son from [place] to assist initially) Type of Home: House Home Access: Stairs to enter Entrance Stairs-Rails: None Entrance Stairs-Number of Steps: 1+2 Home Layout: One level Home Equipment: Walker - 2 wheels      Prior Function Level of Independence: Independent;Independent with assistive device(s)         Comments: Used RW prior to surgery 2* hip pain     Hand Dominance        Extremity/Trunk Assessment   Upper Extremity Assessment Upper Extremity Assessment: Overall WFL for tasks assessed    Lower Extremity Assessment Lower Extremity Assessment: RLE deficits/detail;LLE deficits/detail RLE Deficits / Details: strength at hip 2/5 with AAROM to 90 flexion.  PROM ABD to 15.  NO Active ABD per physician LLE Deficits / Details: Strength WFL but ROM ltd all planes 2* OA changes - pt states she plans to have L hip replaced in * weeks    Cervical / Trunk Assessment Cervical / Trunk Assessment: Kyphotic  Communication   Communication: No difficulties  Cognition Arousal/Alertness: Awake/alert Behavior During Therapy: WFL for tasks assessed/performed Overall Cognitive Status: Within Functional Limits for tasks assessed                                        General Comments      Exercises Total Joint Exercises Ankle Circles/Pumps: AROM;Both;15 reps;Supine Quad Sets: AROM;Both;10  reps;Supine Heel Slides: AAROM;Right;20 reps;Supine   Assessment/Plan    PT Assessment Patient needs continued PT services  PT Problem List Decreased strength;Decreased range of motion;Decreased activity tolerance;Decreased mobility;Decreased knowledge of use of DME;Pain       PT Treatment Interventions DME instruction;Gait training;Stair training;Functional mobility training;Therapeutic activities;Therapeutic exercise;Patient/family education    PT Goals (Current goals can be found in the Care Plan section)  Acute Rehab PT Goals Patient Stated Goal: Get back to walking PT Goal Formulation: With patient Time For Goal Achievement: 06/22/16 Potential to Achieve Goals: Good    Frequency 7X/week   Barriers to discharge        Co-evaluation               End of Session Equipment Utilized During Treatment: Gait belt Activity Tolerance: Patient limited by fatigue;Patient limited by pain Patient left: in chair;with call bell/phone within reach;with nursing/sitter in room Nurse Communication: Mobility status PT Visit Diagnosis: Unsteadiness on feet (R26.81);Difficulty in walking, not elsewhere classified (R26.2)    Time: 1610-9604 PT Time Calculation (min) (ACUTE ONLY): 30 min   Charges:   PT Evaluation $PT Eval Low Complexity: 1 Procedure PT Treatments $Gait Training: 8-22 mins   PT G Codes:        Pg [phone]   Jillann Charette 06/16/2016, 1:14 PM

## 2016-06-16 NOTE — Evaluation (Signed)
Occupational Therapy Evaluation Patient Details Name: [name] MRN: [id number] DOB: 1940/10/26 Today's Date: 06/16/2016    History of Present Illness Pt s/p R THR and transferred to step down post op 2* HR/BP issues.  Pt with hx of CHF, A-fib and Aortic valve replacement.     Clinical Impression   This 75 year old female was admitted for the above sx. She will benefit from continued OT in acute setting with min guard level goals.  Pt currently needs min to mod A and she was independent prior to admission    Follow Up Recommendations  Supervision/Assistance - 24 hour    Equipment Recommendations  None recommended by OT (has elevated toilet seat)    Recommendations for Other Services       Precautions / Restrictions Precautions Precautions: Fall;Other (comment) Precaution Comments: NO ACTIVE ABDUCTION Restrictions Weight Bearing Restrictions: No Other Position/Activity Restrictions: WBAT      Mobility Bed Mobility Overal bed mobility: Needs Assistance Bed Mobility: Sit to Supine     Supine to sit: Min assist;Mod assist Sit to supine: Min assist;Mod assist;+2 for physical assistance;+2 for safety/equipment   General bed mobility comments: increased time with cues for sequence and use of L LE to self assist  Transfers Overall transfer level: Needs assistance Equipment used: Rolling walker (2 wheeled) Transfers: Sit to/from Stand Sit to Stand: Min assist;Mod assist         General transfer comment: cues for LE management and use of UEs to self assist    Balance                                           ADL either performed or assessed with clinical judgement   ADL Overall ADL's : Needs assistance/impaired     Grooming: Set up;Sitting   Upper Body Bathing: Set up;Sitting   Lower Body Bathing: Minimal assistance;Sit to/from stand   Upper Body Dressing : Set up;Sitting   Lower Body Dressing: Moderate assistance;Sit to/from stand    Toilet Transfer: Minimal assistance;Moderate assistance;+2 for safety/equipment;Ambulation (back to bed; +2 lines)             General ADL Comments: pt has catheter in place. Educated on AE.  Pt may not need reacher for pants.  She plans to sponge bathe initially     Vision         Perception     Praxis      Pertinent Vitals/Pain Pain Assessment: Faces Faces Pain Scale: Hurts little more Pain Location: R hip Pain Descriptors / Indicators: Aching;Sore Pain Intervention(s): Limited activity within patient's tolerance;Monitored during session;Premedicated before session     Hand Dominance     Extremity/Trunk Assessment Upper Extremity Assessment Upper Extremity Assessment: Overall WFL for tasks assessed      Cervical / Trunk Assessment Cervical / Trunk Assessment: Kyphotic   Communication Communication Communication: No difficulties   Cognition Arousal/Alertness: Awake/alert Behavior During Therapy: WFL for tasks assessed/performed Overall Cognitive Status: Within Functional Limits for tasks assessed                                     General Comments       Exercises    Shoulder Instructions      Home Living Family/patient expects to be discharged to:: Private residence Living  Arrangements: Alone Available Help at Discharge: Family;Available 24 hours/day Type of Home: House Home Access: [employer] Stairs-Number of Steps: 1+2 Entrance Stairs-Rails: None Home Layout: One level     Bathroom Shower/Tub: Chief Strategy Officer: Standard     Home Equipment: Toilet riser;Walker - 2 wheels   Additional Comments: son from [place] will stay initially.  He bought her a toilet riser; she is unsure if it has rails.  She has a tub and has been sponge bathing.  Normally she gets to bottom of tub for bathing      Prior Functioning/Environment Level of Independence: Independent;Independent with assistive device(s)         Comments: Used RW prior to surgery 2* hip pain        OT Problem List: Decreased activity tolerance;Decreased knowledge of use of DME or AE;Decreased knowledge of precautions;Pain      OT Treatment/Interventions: Self-care/ADL training;DME and/or AE instruction;Patient/family education;Balance training;Therapeutic activities    OT Goals(Current goals can be found in the care plan section) Acute Rehab OT Goals Patient Stated Goal: Get back to walking OT Goal Formulation: With patient Time For Goal Achievement: 06/23/16 Potential to Achieve Goals: Good ADL Goals Pt Will Perform Grooming: with min guard assist;standing Pt Will Transfer to Toilet: with min guard assist;ambulating;bedside commode Pt Will Perform Toileting - Clothing Manipulation and hygiene: with min guard assist;with adaptive equipment Additional ADL Goal #1: pt will perform ADL with AE as needed, with set up and min guard assist for sit to stand  OT Frequency: Min 2X/week   Barriers to D/C:            Co-evaluation   Reason for Co-Treatment: For patient/therapist safety PT goals addressed during session: Mobility/safety with mobility OT goals addressed during session: ADL's and self-care      End of Session    Activity Tolerance: Patient tolerated treatment well Patient left: in bed;with call bell/phone within reach;with bed alarm set  OT Visit Diagnosis: Pain Pain - Right/Left: Right Pain - part of body: Hip                Time: 1424-1501 OT Time Calculation (min): 37 min Charges:  OT General Charges $OT Visit: 1 Procedure OT Evaluation $OT Eval Low Complexity: 1 Procedure G-Codes:     [name], OTR/L [phone] 06/16/2016  Averiana Clouatre 06/16/2016, 3:37 PM

## 2016-06-16 NOTE — Care Management Note (Signed)
Case Management Note  Patient Details  Name: [name] MRN: [id number] Date of Birth: [birth year]  Subjective/Objective:    orif of hip with post op hypotension                Action/Plan: Date:  June 16, 2016 Chart reviewed for concurrent status and case management needs. Will continue to follow patient progress. Discharge Planning: following for needs Expected discharge date: [id number] Marcelle Smiling, BSN, [place], [place]   [phone] Expected Discharge Date:                  Expected Discharge Plan:  Skilled Nursing Facility  In-House Referral:  Clinical Social Work  Discharge planning Services     Post Acute Care Choice:    Choice offered to:     DME Arranged:    DME Agency:     HH Arranged:    HH Agency:     Status of Service:  In process, will continue to follow  If discussed at Long Length of Stay Meetings, dates discussed:    Additional Comments:  Golda Acre, RN 06/16/2016, 8:44 AM

## 2016-06-17 ENCOUNTER — Inpatient Hospital Stay (HOSPITAL_COMMUNITY): Payer: Medicare Other

## 2016-06-17 ENCOUNTER — Other Ambulatory Visit (HOSPITAL_COMMUNITY): Payer: Medicare Other

## 2016-06-17 DIAGNOSIS — R9431 Abnormal electrocardiogram [ECG] [EKG]: Secondary | ICD-10-CM

## 2016-06-17 LAB — ECHOCARDIOGRAM COMPLETE
AV Mean grad: 39 mmHg
AV Peak grad: 75 mmHg
AV VEL mean LVOT/AV: 0.27
AVPKVEL: 434 cm/s
Ao pk vel: 0.23 m/s
DOP CAL AO MEAN VELOCITY: 282 cm/s
EERAT: 22.67
EWDT: 254 ms
FS: 45 % — AB (ref 28–44)
HEIGHTINCHES: 64 in
IV/PV OW: 0.86
LA ID, A-P, ES: 38 mm
LA diam index: 2.28 cm/m2
LA vol index: 20.6 mL/m2
LA vol: 34.4 mL
LAVOLA4C: 28.9 mL
LEFT ATRIUM END SYS DIAM: 38 mm
LV E/e'average: 22.67
LV TDI E'LATERAL: 6
LV TDI E'MEDIAL: 3.57
LVEEMED: 22.67
LVELAT: 6 cm/s
LVOT VTI: 22 cm
LVOT peak VTI: 0.27 cm
LVOT peak vel: 99.4 cm/s
MV Annulus VTI: 40.3 cm
MV Dec: 254
MV M vel: 91.5
MV pk A vel: 117 m/s
MVAP: 2.65 cm2
MVG: 4 mmHg
MVPG: 7 mmHg
MVPKEVEL: 136 m/s
P 1/2 time: 83 ms
PW: 10.3 mm — AB (ref 0.6–1.1)
RV TAPSE: 16.1 mm
RV sys press: 47 mmHg
Reg peak vel: 331 cm/s
TR max vel: 331 cm/s
VTI: 82.5 cm
WEIGHTICAEL: 2197.55 [oz_av]

## 2016-06-17 LAB — BASIC METABOLIC PANEL
ANION GAP: 5 (ref 5–15)
BUN: 23 mg/dL — AB (ref 6–20)
CHLORIDE: 108 mmol/L (ref 101–111)
CO2: 25 mmol/L (ref 22–32)
Calcium: 9.4 mg/dL (ref 8.9–10.3)
Creatinine, Ser: 1.05 mg/dL — ABNORMAL HIGH (ref 0.44–1.00)
GFR calc Af Amer: 58 mL/min — ABNORMAL LOW (ref >60)
GFR calc non Af Amer: 50 mL/min — ABNORMAL LOW (ref >60)
Glucose, Bld: 138 mg/dL — ABNORMAL HIGH (ref 65–99)
POTASSIUM: 4.6 mmol/L (ref 3.5–5.1)
SODIUM: 138 mmol/L (ref 135–145)

## 2016-06-17 LAB — CBC
HCT: 28.3 % — ABNORMAL LOW (ref 36.0–46.0)
HEMOGLOBIN: 9.9 g/dL — AB (ref 12.0–15.0)
MCH: 31.5 pg (ref 26.0–34.0)
MCHC: 35 g/dL (ref 30.0–36.0)
MCV: 90.1 fL (ref 78.0–100.0)
Platelets: 80 10*3/uL — ABNORMAL LOW (ref 150–400)
RBC: 3.14 MIL/uL — AB (ref 3.87–5.11)
RDW: 16.2 % — ABNORMAL HIGH (ref 11.5–15.5)
WBC: 10.8 10*3/uL — AB (ref 4.0–10.5)

## 2016-06-17 NOTE — Progress Notes (Signed)
Physical Therapy Treatment Patient Details Name: [name] MRN: [id number] DOB: 1940/05/18 Today's Date: 06/17/2016    History of Present Illness Pt s/p R THR and transferred to step down post op 2* HR/BP issues.  Pt with hx of CHF, A-fib and Aortic valve replacement.      PT Comments    Pt motivated and progressing with mobility.   Follow Up Recommendations  Home health PT     Equipment Recommendations  None recommended by PT    Recommendations for Other Services OT consult     Precautions / Restrictions Precautions Precautions: Fall;Other (comment) Precaution Comments: NO ACTIVE ABDUCTION Restrictions Weight Bearing Restrictions: No Other Position/Activity Restrictions: WBAT    Mobility  Bed Mobility Overal bed mobility: Needs Assistance Bed Mobility: Supine to Sit     Supine to sit: Min assist     General bed mobility comments: increased time with cues for sequence, limiting active ABD and use of L LE to self assist  Transfers Overall transfer level: Needs assistance Equipment used: Rolling walker (2 wheeled) Transfers: Sit to/from Stand Sit to Stand: Min assist         General transfer comment: cues for LE management and use of UEs to self assist  Ambulation/Gait Ambulation/Gait assistance: Min assist;+2 safety/equipment Ambulation Distance (Feet): 95 Feet Assistive device: Rolling walker (2 wheeled) Gait Pattern/deviations: Step-to pattern;Step-through pattern;Decreased step length - right;Decreased step length - left;Shuffle;Trunk flexed Gait velocity: decr Gait velocity interpretation: Below normal speed for age/gender General Gait Details: cues for sequence, posture and position from [employer]            Wheelchair Mobility    Modified Rankin (Stroke Patients Only)       Balance                                            Cognition Arousal/Alertness: Awake/alert Behavior During Therapy: WFL for tasks  assessed/performed Overall Cognitive Status: Within Functional Limits for tasks assessed                                        Exercises Total Joint Exercises Ankle Circles/Pumps: AROM;Both;15 reps;Supine Quad Sets: AROM;Both;10 reps;Supine Heel Slides: AAROM;Right;20 reps;Supine    General Comments        Pertinent Vitals/Pain Pain Assessment: 0-10 Pain Score: 2  Pain Location: R hip Pain Descriptors / Indicators: Aching;Sore Pain Intervention(s): Limited activity within patient's tolerance;Monitored during session;Premedicated before session;Ice applied    Home Living                      Prior Function            PT Goals (current goals can now be found in the care plan section) Acute Rehab PT Goals Patient Stated Goal: Get back to walking PT Goal Formulation: With patient Time For Goal Achievement: 06/22/16 Potential to Achieve Goals: Good Progress towards PT goals: Progressing toward goals    Frequency    7X/week      PT Plan Current plan remains appropriate    Co-evaluation             End of Session Equipment Utilized During Treatment: Gait belt Activity Tolerance: Patient tolerated treatment well Patient left: in chair;with call bell/phone  within reach;with chair alarm set Nurse Communication: Mobility status PT Visit Diagnosis: Unsteadiness on feet (R26.81);Difficulty in walking, not elsewhere classified (R26.2)     Time: 1660-6301 PT Time Calculation (min) (ACUTE ONLY): 28 min  Charges:  $Gait Training: 8-22 mins $Therapeutic Exercise: 8-22 mins                    G Codes:       Pg [phone]    Amesha Bailey 06/17/2016, 11:23 AM

## 2016-06-17 NOTE — Progress Notes (Signed)
Current BP at 11:00am is 128/74.  Dr. Jacinto Halim requested to hold all blood pressure medications and fluid medications. These includes: amlodipine, lisinopril, furosemide and spironolactone until discharge.

## 2016-06-17 NOTE — Progress Notes (Signed)
*  PRELIMINARY RESULTS* Echocardiogram 2D Echocardiogram has been performed.  [name] 06/17/2016, 4:20 PM

## 2016-06-17 NOTE — Progress Notes (Signed)
Physical Therapy Treatment Patient Details Name: [name] MRN: [id number] DOB: 1940-10-19 Today's Date: 06/17/2016    History of Present Illness Pt s/p R THR and transferred to step down post op 2* HR/BP issues.  Pt with hx of CHF, A-fib and Aortic valve replacement.      PT Comments    Steady progress with mobility but continues to require assist for ALL aspects of mobility.   Follow Up Recommendations  Home health PT     Equipment Recommendations  None recommended by PT    Recommendations for Other Services OT consult     Precautions / Restrictions Precautions Precautions: Fall;Other (comment) Precaution Comments: NO ACTIVE ABDUCTION Restrictions Weight Bearing Restrictions: No Other Position/Activity Restrictions: WBAT    Mobility  Bed Mobility Overal bed mobility: Needs Assistance Bed Mobility: Supine to Sit     Supine to sit: Min assist     General bed mobility comments: increased time with cues for sequence, limiting active ABD and use of L LE to self assist  Transfers Overall transfer level: Needs assistance Equipment used: Rolling walker (2 wheeled) Transfers: Sit to/from Stand Sit to Stand: Min assist;Mod assist         General transfer comment: cues for LE management and use of UEs to self assist  Ambulation/Gait Ambulation/Gait assistance: Min assist Ambulation Distance (Feet): 78 Feet Assistive device: Rolling walker (2 wheeled) Gait Pattern/deviations: Step-to pattern;Step-through pattern;Decreased step length - right;Decreased step length - left;Shuffle;Trunk flexed Gait velocity: decr Gait velocity interpretation: Below normal speed for age/gender General Gait Details: cues for sequence, posture and position from [employer]            Wheelchair Mobility    Modified Rankin (Stroke Patients Only)       Balance                                            Cognition Arousal/Alertness:  Awake/alert Behavior During Therapy: WFL for tasks assessed/performed Overall Cognitive Status: Within Functional Limits for tasks assessed                                        Exercises      General Comments        Pertinent Vitals/Pain Pain Assessment: 0-10 Pain Score: 4  Pain Location: R hip Pain Descriptors / Indicators: Aching;Sore Pain Intervention(s): Limited activity within patient's tolerance;Monitored during session;Premedicated before session    Home Living                      Prior Function            PT Goals (current goals can now be found in the care plan section) Acute Rehab PT Goals Patient Stated Goal: Get back to walking PT Goal Formulation: With patient Time For Goal Achievement: 06/22/16 Potential to Achieve Goals: Good Progress towards PT goals: Progressing toward goals    Frequency    7X/week      PT Plan Current plan remains appropriate    Co-evaluation             End of Session Equipment Utilized During Treatment: Gait belt Activity Tolerance: Patient tolerated treatment well;Patient limited by pain Patient left: in chair;with call bell/phone within reach;with nursing/sitter  in room Nurse Communication: Mobility status PT Visit Diagnosis: Unsteadiness on feet (R26.81);Difficulty in walking, not elsewhere classified (R26.2)     Time: 8657-8469 PT Time Calculation (min) (ACUTE ONLY): 30 min  Charges:  $Gait Training: 8-22 mins $Therapeutic Activity: 8-22 mins                    G Codes:       Pg [phone]    Reuel Lamadrid 06/17/2016, 4:41 PM

## 2016-06-17 NOTE — Progress Notes (Signed)
At 10:00 am BP was 110/67 (79). Lisinopril was not given. Furosemide and spironolactone was administered.

## 2016-06-17 NOTE — Progress Notes (Signed)
     Subjective: 2 Days Post-Op Procedure(s) (LRB): RIGHT TOTAL HIP ARTHROPLASTY ANTERIOR APPROACH (Right)   Patient reports pain as mild, pain controlled.  Patient's BP still fluctuating quite a bit. Dopamine gtt was d/c yesterday(06/16/2016). She feels that she is doing well with PT.   Objective:   VITALS:   Vitals:   06/17/16 0600 06/17/16 0700  BP: (!) 90/58 105/60  Pulse: (!) 59 64  Resp: 20 16  Temp:  98.4 F (36.9 C)    Dorsiflexion/Plantar flexion intact Incision: dressing C/D/I No cellulitis present Compartment soft  LABS  Recent Labs  06/16/16 0203 06/16/16 1124 06/17/16 0406  HGB 7.5* 10.8* 9.9*  HCT 22.6* 31.5* 28.3*  WBC 8.9  --  10.8*  PLT 89*  --  80*     Recent Labs  06/16/16 0203 06/17/16 0406  NA 138 138  K 4.4 4.6  BUN 16 23*  CREATININE 1.02* 1.05*  GLUCOSE 205* 138*     Assessment/Plan: 2 Days Post-Op Procedure(s) (LRB): RIGHT TOTAL HIP ARTHROPLASTY ANTERIOR APPROACH (Right)  Up with therapy Discharge home when medically stable     Anastasio Auerbach. Hilding Quintanar   PAC  06/17/2016, 8:33 AM

## 2016-06-17 NOTE — Progress Notes (Signed)
Patient transfered from ICU, VSS, tele applied. Denies any needs at this time. Will continue to monitor.

## 2016-06-18 ENCOUNTER — Other Ambulatory Visit: Payer: Self-pay

## 2016-06-18 LAB — CBC
HEMATOCRIT: 29.6 % — AB (ref 36.0–46.0)
HEMOGLOBIN: 9.6 g/dL — AB (ref 12.0–15.0)
MCH: 30 pg (ref 26.0–34.0)
MCHC: 32.4 g/dL (ref 30.0–36.0)
MCV: 92.5 fL (ref 78.0–100.0)
Platelets: 79 10*3/uL — ABNORMAL LOW (ref 150–400)
RBC: 3.2 MIL/uL — ABNORMAL LOW (ref 3.87–5.11)
RDW: 16 % — AB (ref 11.5–15.5)
WBC: 9.2 10*3/uL (ref 4.0–10.5)

## 2016-06-18 LAB — BASIC METABOLIC PANEL
ANION GAP: 3 — AB (ref 5–15)
BUN: 25 mg/dL — ABNORMAL HIGH (ref 6–20)
CALCIUM: 9.1 mg/dL (ref 8.9–10.3)
CHLORIDE: 104 mmol/L (ref 101–111)
CO2: 28 mmol/L (ref 22–32)
CREATININE: 1 mg/dL (ref 0.44–1.00)
GFR calc non Af Amer: 53 mL/min — ABNORMAL LOW (ref >60)
Glucose, Bld: 110 mg/dL — ABNORMAL HIGH (ref 65–99)
Potassium: 4.5 mmol/L (ref 3.5–5.1)
SODIUM: 135 mmol/L (ref 135–145)

## 2016-06-18 LAB — TROPONIN I: TROPONIN I: 0.03 ng/mL — AB (ref <0.03)

## 2016-06-18 LAB — MAGNESIUM: MAGNESIUM: 1.5 mg/dL — AB (ref 1.7–2.4)

## 2016-06-18 NOTE — Progress Notes (Signed)
Physical Therapy Treatment Patient Details Name: [name] MRN: [id number] DOB: 01-Dec-1940 Today's Date: 06/18/2016    History of Present Illness Pt s/p R THR and transferred to step down post op 2* HR/BP issues.  Pt with hx of CHF, A-fib and Aortic valve replacement.      PT Comments    POD # 2 pm session Pt just got back from walking to bathroom.  Performed THR TE's in recliner followed by ICE.  Pt plans to D/C to home tomorrow.   Follow Up Recommendations  Home health PT     Equipment Recommendations  None recommended by PT    Recommendations for Other Services       Precautions / Restrictions Precautions Precautions: Fall Precaution Comments: NO ACTIVE ABDUCTION Restrictions Weight Bearing Restrictions: No       Balance                                            Cognition Arousal/Alertness: Awake/alert Behavior During Therapy: WFL for tasks assessed/performed Overall Cognitive Status: Within Functional Limits for tasks assessed                                        Exercises   Total Hip Replacement TE's 10 reps ankle pumps 10 reps knee presses 10 reps heel slides 10 reps SAQ's 10 reps ABD Followed by ICE     General Comments        Pertinent Vitals/Pain Pain Assessment: 0-10 Pain Score: 4  Pain Location: R hip Pain Descriptors / Indicators: Operative site guarding Pain Intervention(s): Monitored during session;Repositioned;Ice applied    Home Living                      Prior Function            PT Goals (current goals can now be found in the care plan section) Acute Rehab PT Goals Patient Stated Goal: Get back to walking Progress towards PT goals: Progressing toward goals    Frequency    7X/week      PT Plan Current plan remains appropriate    Co-evaluation             End of Session Equipment Utilized During Treatment: Gait belt Activity Tolerance: Patient tolerated  treatment well;Patient limited by pain Patient left: in chair;with call bell/phone within reach;with nursing/sitter in room Nurse Communication: Mobility status PT Visit Diagnosis: Unsteadiness on feet (R26.81);Difficulty in walking, not elsewhere classified (R26.2)     Time: 4098-1191 PT Time Calculation (min) (ACUTE ONLY): 13 min  Charges:  $Therapeutic Exercise: 8-22 mins                    G Codes:       Felecia Shelling  PTA [facility]  Acute  Rehab Pager      [phone]

## 2016-06-18 NOTE — Progress Notes (Signed)
Subjective:  She is presently doing well and denies any dizziness or syncope, blood pressure has been stable, Lasix has been held along with antihypertensive medications.  No PND or orthopnea.  No chest pain.  Objective:  Vital Signs in the last 24 hours: Temp:  [97.4 F (36.3 C)-98 F (36.7 C)] 97.7 F (36.5 C) (04/29 1336) Pulse Rate:  [44-86] 44 (04/29 1336) Resp:  [18-20] 18 (04/29 1336) BP: (96-152)/(52-90) 132/71 (04/29 1336) SpO2:  [100 %] 100 % (04/29 1336)  Intake/Output from previous day: 04/28 0701 - 04/29 0700 In: 1920 [P.O.:1920] Out: 877 [Urine:877]  Physical Exam: Blood pressure 132/71, pulse (!) 44, temperature 97.7 F (36.5 C), temperature source Oral, resp. rate 18, height  (1.626 m), weight 62.3 kg (137 lb 5.6 oz), SpO2 100 %.  General appearance: alert, cooperative, appears stated age and no distress Eyes: negative findings: lids and lashes normal Neck: no adenopathy, no JVD, supple, symmetrical, trachea midline, thyroid not enlarged, symmetric, no tenderness/mass/nodules and Bilateral carotid bruit present Neck: JVP - normal, carotids 2+= without bruits Resp: clear to auscultation bilaterally Chest wall: no tenderness Cardio: S1, S2 normal, no S3 or S4 and systolic murmur: late systolic 3/6, crescendo and decrescendo at 2nd right intercostal space, radiates to carotids GI: soft, non-tender; bowel sounds normal; no masses,  no organomegaly Extremities: extremities normal, atraumatic, no cyanosis or edema and Right hip arthroplasty site healthy    Lab Results: BMP  Recent Labs  06/16/16 0203 06/17/16 0406 06/18/16 0525  NA 138 138 135  K 4.4 4.6 4.5  CL 110 108 104  CO2 21* 25 28  GLUCOSE 205* 138* 110*  BUN 16 23* 25*  CREATININE 1.02* 1.05* 1.00  CALCIUM 9.0 9.4 9.1  GFRNONAA 52* 50* 53*  GFRAA >60 58* >60    CBC  Recent Labs Lab 06/18/16 0523  WBC 9.2  RBC 3.20*  HGB 9.6*  HCT 29.6*  PLT 79*  MCV 92.5  MCH 30.0  MCHC 32.4   RDW 16.0*    HEMOGLOBIN A1C Lab Results  Component Value Date   HGBA1C 6.3 (H) 10/10/2011   MPG 134 (H) 10/10/2011    Cardiac Panel (last 3 results)  Recent Labs  06/16/16 1124 06/16/16 1539 06/18/16 0525  TROPONINI <0.03 <0.03 0.03*    BNP (last 3 results) No results for input(s): PROBNP in the last 8760 hours.  TSH No results for input(s): TSH in the last 8760 hours.  Lipid Panel     Component Value Date/Time   CHOL 141 08/22/2011 0635   TRIG 72 08/22/2011 0635   HDL 67 08/22/2011 0635   CHOLHDL 2.1 08/22/2011 0635   VLDL 14 08/22/2011 0635   LDLCALC 60 08/22/2011 0635     Hepatic Function Panel No results for input(s): PROT, ALBUMIN, AST, ALT, ALKPHOS, BILITOT, BILIDIR, IBILI in the last 8760 hours.  Imaging: Imaging results have been reviewed  Cardiac Studies:  EKG 06/15/2016: Sinus rhythm with first-degree AV block,  Right atrial abnormality,  left axis deviation, left anterior fascicular block. Nonspecific T abnormality. PAC.  ECHO: 06/17/2016: Normal LV systolic function, grade 2 diastolic dysfunction, severe bioprosthetic aortic stenosis, mean gradient greater than 40 mmHg.  Mitral and or calcification.  RV systolic function mildly reduced.  Moderate pulmonary hypertension.  Telemetry: Sinus with first-degree AV block, frequent PACs, sinus bradycardia, no significant heart block, no atrial fibrillation  Assessment/Plan:  1. 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 2. Sick  sinus syndrome, again asymptomatic. 3. 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 Chronic renal insufficiency stage III secondary to hypertension. Acute renal failure has resolved. 5.  Hypertension amlodipine and lisinopril on hold due to hypotension.  Now stable.  Recommendation: Patient is stable from  cardiac standpoint.  Heart block is of no clinical consequence as she has no symptoms from this.  In spite of her multiple medical comorbidities, she has done extremely well and has started to ambulate and does not want to go to a rehabilitation but wants to go home.   I will see her back in the office in one week for outpatient management of severe aortic stenosis.   There is no clinical evidence of congestive heart failure. I would recommend to hold off on amlodipine, lisinopril, furosemide for now.  Yates Decamp, M.D. 06/18/2016, 2:50 PM [facility] Pager: [phone] Office: [phone] If no answer: [phone]

## 2016-06-18 NOTE — Progress Notes (Signed)
Physical Therapy Treatment Patient Details Name: [name] MRN: [id number] DOB: 1940/07/04 Today's Date: 06/18/2016    History of Present Illness Pt s/p R THR and transferred to step down post op 2* HR/BP issues.  Pt with hx of CHF, A-fib and Aortic valve replacement.      PT Comments    POD # 3 am session Pt OOB in recliner.  Assisted with amb a greater distance.  Pt progressing well.  States she has one step to enter her home.    Follow Up Recommendations  Home health PT     Equipment Recommendations  None recommended by PT    Recommendations for Other Services       Precautions / Restrictions Precautions Precautions: Fall Precaution Comments: NO ACTIVE ABDUCTION Restrictions Weight Bearing Restrictions: No    Mobility  Bed Mobility               General bed mobility comments: oob  Transfers Overall transfer level: Needs assistance Equipment used: Rolling walker (2 wheeled) Transfers: Sit to/from Stand Sit to Stand: Min guard         General transfer comment: for safety  Ambulation/Gait Ambulation/Gait assistance: Min assist Ambulation Distance (Feet): 85 Feet Assistive device: Rolling walker (2 wheeled) Gait Pattern/deviations: Step-to pattern;Step-through pattern;Decreased step length - left;Decreased step length - right;Decreased stance time - right Gait velocity: decr   General Gait Details: cues for sequence, posture and position from [employer]            Wheelchair Mobility    Modified Rankin (Stroke Patients Only)       Balance                                            Cognition Arousal/Alertness: Awake/alert Behavior During Therapy: WFL for tasks assessed/performed Overall Cognitive Status: Within Functional Limits for tasks assessed                                        Exercises      General Comments        Pertinent Vitals/Pain Pain Assessment: 0-10 Pain Score: 4   Pain Location: R hip Pain Descriptors / Indicators: Operative site guarding Pain Intervention(s): Monitored during session;Repositioned;Ice applied    Home Living                      Prior Function            PT Goals (current goals can now be found in the care plan section) Acute Rehab PT Goals Patient Stated Goal: Get back to walking Progress towards PT goals: Progressing toward goals    Frequency    7X/week      PT Plan Current plan remains appropriate    Co-evaluation             End of Session Equipment Utilized During Treatment: Gait belt Activity Tolerance: Patient tolerated treatment well;Patient limited by pain Patient left: in chair;with call bell/phone within reach;with nursing/sitter in room Nurse Communication: Mobility status PT Visit Diagnosis: Unsteadiness on feet (R26.81);Difficulty in walking, not elsewhere classified (R26.2)     Time: 2952-8413 PT Time Calculation (min) (ACUTE ONLY): 10 min  Charges:  $Gait Training: 8-22 mins  G Codes:       Rica Koyanagi  PTA [facility]  Acute  Rehab Pager      [phone]

## 2016-06-18 NOTE — Progress Notes (Signed)
Occupational Therapy Treatment Patient Details Name: [name] MRN: [id number] DOB: 08-29-1940 Today's Date: 06/18/2016    History of present illness Pt s/p R THR and transferred to step down post op 2* HR/BP issues.  Pt with hx of CHF, A-fib and Aortic valve replacement.     OT comments  Pt is making good progress with OT. Very motivated.    Follow Up Recommendations  Supervision/Assistance - 24 hour    Equipment Recommendations  None recommended by OT    Recommendations for Other Services      Precautions / Restrictions Precautions Precautions: Fall;Other (comment) Precaution Comments: NO ACTIVE ABDUCTION Restrictions Weight Bearing Restrictions: No       Mobility Bed Mobility               General bed mobility comments: oob  Transfers   Equipment used: Rolling walker (2 wheeled)   Sit to Stand: Min guard         General transfer comment: for safety    Balance                                           ADL either performed or assessed with clinical judgement   ADL       Grooming: Oral care;Wash/dry hands;Supervision/safety;Standing                   Toilet Transfer: Min guard;Ambulation;BSC;RW   Toileting- Architect and Hygiene: Min guard;Sit to/from stand         General ADL Comments: for safety.  Pt cued herself for hand placement     Vision       Perception     Praxis      Cognition Arousal/Alertness: Awake/alert Behavior During Therapy: WFL for tasks assessed/performed Overall Cognitive Status: Within Functional Limits for tasks assessed                                          Exercises     Shoulder Instructions       General Comments      Pertinent Vitals/ Pain       Pain Score: 4  Pain Location: R hip Pain Descriptors / Indicators: Sore Pain Intervention(s): Limited activity within patient's tolerance;Monitored during session;Repositioned;Patient  requesting pain meds-RN notified;Ice applied  Home Living                                          Prior Functioning/Environment              Frequency  Min 2X/week        Progress Toward Goals  OT Goals(current goals can now be found in the care plan section)  Progress towards OT goals: Progressing toward goals  Acute Rehab OT Goals Patient Stated Goal: Get back to walking Time For Goal Achievement: 06/23/16  Plan      Co-evaluation                 End of Session    OT Visit Diagnosis: Pain Pain - Right/Left: Right Pain - part of body: Hip   Activity Tolerance Patient tolerated treatment well   Patient Left in chair;with  call bell/phone within reach   Nurse Communication          Time: 1610-9604 OT Time Calculation (min): 31 min  Charges: OT General Charges $OT Visit: 1 Procedure OT Treatments $Self Care/Home Management : 23-37 mins  [name], OTR/L [phone] 06/18/2016   [name] 06/18/2016, 11:33 AM

## 2016-06-18 NOTE — Progress Notes (Signed)
I am aware of the patient's sick sinus syndrome and please refer to my consult notes and also progress notes. I do not want her to be treated for asymptomatic bradycardia or junctional rhythm. Please ambulate and discharge patient per Ortho.   I will see her today

## 2016-06-18 NOTE — Progress Notes (Signed)
     Subjective: 3 Days Post-Op Procedure(s) (LRB): RIGHT TOTAL HIP ARTHROPLASTY ANTERIOR APPROACH (Right)   Patient reports pain as mild, pain controlled. Pt still withouth complaints of CP or SOB.  She is doing well.  Still on tele with some pauses.  New EKG and labs ordered last night   Objective:   VITALS:   Vitals:   06/18/16 0446 06/18/16 0508  BP: 108/60 (!) 110/59  Pulse: 65 (!) 59  Resp: 18 19  Temp:  98 F (36.7 C)    Dorsiflexion/Plantar flexion intact Incision: dressing C/D/I No cellulitis present Compartment soft  LABS  Recent Labs  06/16/16 0203 06/16/16 1124 06/17/16 0406 06/18/16 0523  HGB 7.5* 10.8* 9.9* 9.6*  HCT 22.6* 31.5* 28.3* 29.6*  WBC 8.9  --  10.8* 9.2  PLT 89*  --  80* 79*     Recent Labs  06/16/16 0203 06/17/16 0406 06/18/16 0525  NA 138 138 135  K 4.4 4.6 4.5  BUN 16 23* 25*  CREATININE 1.02* 1.05* 1.00  GLUCOSE 205* 138* 110*     Assessment/Plan: 3 Days Post-Op Procedure(s) (LRB): RIGHT TOTAL HIP ARTHROPLASTY ANTERIOR APPROACH (Right)  Up with therapy Discharge home when medically stable Will defer to cards for continued work up    [employer] [facility]   06/18/2016, 8:49 AM

## 2016-06-18 NOTE — Progress Notes (Signed)
,  At 0415 CTM reports that pt had 2 pauses longer than previous ones.  The first was 2.63  And the second was 3.34 .  VSS =  110/55, 59, 98.0, 16, 100% RA.  Pt remains asymptomatic.  Returns to 1st degree HB after each with rate mostly in uper 50's low 60's.  Dr. Toniann Fail on call and notified, at first gave me orders for labs and EKG.  Then called me back  15 minutes later and told me to call Dr. Jacinto Halim.  The number in system for Dr. Jacinto Halim is no longer in service.  Neither the operator, ED, ICU or A/C had another number.  Therefore, Dr. Burton Apley from [facility] returned my call, told me to go ahead and get Bmet, Troponi, Magnesium and EKG as ordered by Dr. Lanna Poche.  Continue to monitor and contact [facility] on day shift and inform him of information.  CTM reports that from 0435 thru 0453 pt had 4 more pauses each lesser in length from 2.3 down to 1.75 seconds.  Pt remains asymptomatic with VSS.  Will continue to monitor.

## 2016-06-19 LAB — TYPE AND SCREEN
ABO/RH(D): A POS
ANTIBODY SCREEN: NEGATIVE
UNIT DIVISION: 0
Unit division: 0

## 2016-06-19 LAB — BPAM RBC
Blood Product Expiration Date: 201805162359
Blood Product Expiration Date: 201805162359
ISSUE DATE / TIME: 201804270326
ISSUE DATE / TIME: 201804270603
UNIT TYPE AND RH: 6200
Unit Type and Rh: 6200

## 2016-06-19 NOTE — Progress Notes (Signed)
Occupational Therapy Treatment Patient Details Name: [name] MRN: [id number] DOB: Jan 28, 1941 Today's Date: 06/19/2016    History of present illness Pt s/p R THR and transferred to step down post op 2* HR/BP issues.  Pt with hx of CHF, A-fib and Aortic valve replacement.     OT comments  Pt progressing towards goals; planning for DC home today with assist from family. Education provided on using AE for completing LB dressing, compensatory techniques and safety during ADL completion while at home. All questions answered. Will continue to follow while in house. Continue per POC.    Follow Up Recommendations  Supervision/Assistance - 24 hour    Equipment Recommendations  None recommended by OT          Precautions / Restrictions Precautions Precautions: Fall Precaution Comments: NO ACTIVE ABDUCTION Restrictions Weight Bearing Restrictions: No Other Position/Activity Restrictions: WBAT       Mobility Bed Mobility               General bed mobility comments: OOB  Transfers Overall transfer level: Needs assistance Equipment used: Rolling walker (2 wheeled) Transfers: Sit to/from Stand Sit to Stand: Min assist;Min guard         General transfer comment: for safety; increased time                                               ADL either performed or assessed with clinical judgement   ADL Overall ADL's : Needs assistance/impaired Eating/Feeding: Independent;Sitting   Grooming: Wash/dry face;Set up;Sitting           Upper Body Dressing : Set up;Sitting   Lower Body Dressing: Moderate assistance;Sit to/from stand Lower Body Dressing Details (indicate cue type and reason): Educated on AE for completing LB dressing Toilet Transfer: Min guard;Ambulation;BSC;RW   Toileting- Architect and Hygiene: Min guard;Sit to/from stand       Functional mobility during ADLs: Min guard;Rolling walker General ADL Comments: for  safety, verbal cues for backing straight up to chair for stand to sit transfer                       Cognition Arousal/Alertness: Awake/alert Behavior During Therapy: WFL for tasks assessed/performed Overall Cognitive Status: Within Functional Limits for tasks assessed                                                      General Comments      Pertinent Vitals/ Pain       Pain Assessment: 0-10 Pain Score: 5  Pain Location: R hip Pain Descriptors / Indicators: Operative site guarding;Tightness;Sore Pain Intervention(s): Limited activity within patient's tolerance;Monitored during session;RN gave pain meds during session;Ice applied                                                          Frequency  Min 2X/week        Progress Toward Goals  OT Goals(current goals can now be found in the care plan  section)  Progress towards OT goals: Progressing toward goals  Acute Rehab OT Goals Patient Stated Goal: Get back to walking OT Goal Formulation: With patient Time For Goal Achievement: 06/23/16 Potential to Achieve Goals: Good  Plan Discharge plan remains appropriate                     End of Session Equipment Utilized During Treatment: Gait belt;Rolling walker  OT Visit Diagnosis: Pain Pain - Right/Left: Right Pain - part of body: Hip   Activity Tolerance Patient tolerated treatment well   Patient Left in chair;with call bell/phone within reach;with nursing/sitter in room   Nurse Communication Mobility status        Time: 1610-9604 OT Time Calculation (min): 29 min  Charges: OT General Charges $OT Visit: 1 Procedure OT Treatments $Self Care/Home Management : 23-37 mins  Marcy Siren, OT Pager [phone] 06/19/2016    Orlando Penner 06/19/2016, 10:52 AM

## 2016-06-19 NOTE — Progress Notes (Signed)
Physical Therapy Treatment Patient Details Name: [name] MRN: [id number] DOB: 1940/09/09 Today's Date: 06/19/2016    History of Present Illness Pt s/p R THR and transferred to step down post op 2* HR/BP issues.  Pt with hx of CHF, A-fib and Aortic valve replacement.      PT Comments    POD # 4 Assisted with amb in hallway, practiced one step then positioned in recliner and applied ICE. Pt progressing well and plans to D/C to home today   Follow Up Recommendations  Home health PT     Equipment Recommendations  None recommended by PT    Recommendations for Other Services       Precautions / Restrictions Precautions Precaution Comments: NO ACTIVE ABDUCTION Restrictions Weight Bearing Restrictions: No Other Position/Activity Restrictions: WBAT    Mobility  Bed Mobility               General bed mobility comments: OOB  Transfers Overall transfer level: Needs assistance Equipment used: Rolling walker (2 wheeled) Transfers: Sit to/from Stand Sit to Stand: Supervision;Min guard         General transfer comment: for safety; increased time  Ambulation/Gait Ambulation/Gait assistance: Supervision;Min guard Ambulation Distance (Feet): 45 Feet Assistive device: Rolling walker (2 wheeled) Gait Pattern/deviations: Step-to pattern;Step-through pattern;Decreased step length - left;Decreased step length - right;Decreased stance time - right Gait velocity: decr   General Gait Details: cues for sequence, posture and position from [employer] Stairs: Yes   Stair Management: No rails;Forwards;With walker;Step to pattern Number of Stairs: 1 General stair comments: 25% VC's on proper walker placement and proper sequencing.    Wheelchair Mobility    Modified Rankin (Stroke Patients Only)       Balance                                            Cognition Arousal/Alertness: Awake/alert Behavior During Therapy: WFL for tasks  assessed/performed Overall Cognitive Status: Within Functional Limits for tasks assessed                                        Exercises      General Comments        Pertinent Vitals/Pain Pain Assessment: 0-10 Pain Score: 3  Pain Location: R hip Pain Descriptors / Indicators: Operative site guarding;Tightness;Sore Pain Intervention(s): Monitored during session;Repositioned;Ice applied    Home Living                      Prior Function            PT Goals (current goals can now be found in the care plan section) Progress towards PT goals: Progressing toward goals    Frequency    7X/week      PT Plan Current plan remains appropriate    Co-evaluation              AM-PAC PT "6 Clicks" Daily Activity  Outcome Measure  Difficulty turning over in bed (including adjusting bedclothes, sheets and blankets)?: A Lot Difficulty moving from lying on back to sitting on the side of the bed? : A Little Difficulty sitting down on and standing up from a chair with arms (e.g., wheelchair, bedside commode, etc,.)?: A Little Help needed  moving to and from a bed to chair (including a wheelchair)?: A Little Help needed walking in hospital room?: A Little Help needed climbing 3-5 steps with a railing? : A Lot 6 Click Score: 16    End of Session Equipment Utilized During Treatment: Gait belt Activity Tolerance: Patient tolerated treatment well Patient left: in chair;with call bell/phone within reach;with nursing/sitter in room Nurse Communication:  (pt ready for D/C to home) PT Visit Diagnosis: Unsteadiness on feet (R26.81);Difficulty in walking, not elsewhere classified (R26.2)     Time: 1610-9604 PT Time Calculation (min) (ACUTE ONLY): 28 min  Charges:  $Gait Training: 8-22 mins $Therapeutic Activity: 8-22 mins                    G Codes:       {Jawon Dipiero  PTA [facility]  Acute  Rehab Pager      [phone] '

## 2016-06-19 NOTE — Care Management Important Message (Signed)
Important Message  Patient Details  Name: [name] MRN: [id number] Date of Birth: 20-Aug-1940   Medicare Important Message Given:  Yes    Caren Macadam 06/19/2016, 11:45 AM

## 2016-06-19 NOTE — Progress Notes (Signed)
   Subjective:  Patient reports pain as mild to moderate.  No c/o.  Objective:   VITALS:   Vitals:   06/18/16 0508 06/18/16 1336 06/18/16 2055 06/19/16 0431  BP: (!) 110/59 132/71 99/65 130/70  Pulse: (!) 59 (!) 44 (!) 52 69  Resp: Temp: 98 F (36.7 C) 97.7 F (36.5 C) 98.5 F (36.9 C) 98.3 F (36.8 C)  TempSrc: Oral Oral Oral Oral  SpO2: 100% 100% 100% 100%  Weight:      Height:        NAD ABD SNTND RLE: dressing c/d/I. (+) TA/GS/EHL. 2+ DP. SILT.  Lab Results  Component Value Date   WBC 9.2 06/18/2016   HGB 9.6 (L) 06/18/2016   HCT 29.6 (L) 06/18/2016   MCV 92.5 06/18/2016   PLT 79 (L) 06/18/2016   BMET    Component Value Date/Time   NA 135 06/18/2016 0525   K 4.5 06/18/2016 0525   CL 104 06/18/2016 0525   CO2 28 06/18/2016 0525   GLUCOSE 110 (H) 06/18/2016 0525   BUN 25 (H) 06/18/2016 0525   CREATININE 1.00 06/18/2016 0525   CALCIUM 9.1 06/18/2016 0525   GFRNONAA 53 (L) 06/18/2016 0525   GFRAA >60 06/18/2016 0525     Assessment/Plan: 4 Days Post-Op   Principal Problem:   Arthropathy of right hip   A/P: s/p complex R THA WBAT with walker DVT ppx: ASA, SCDs, TEDs ABLA: responded to 2 units PRBCs, monitor Arrhythmia: appreciate cardiology input Hypotension: related to ABLA and arrhthymia, resolved PT/OT Dispo: d/c home with HHPT   Monetta Lick, [name] 06/19/2016, 7:19 AM   Samson Frederic, MD Cell [phone]

## 2016-06-19 NOTE — Care Management Note (Signed)
Case Management Note  Patient Details  Name: SEDA KRONBERG MRN: [id number] Date of Birth: 11/24/1940  Subjective/Objective:  Patient s/p THA. From home. [facility] already following PTA-rep Tim aware of d/c w/HHC. Noted patient already had dme.                  Action/Plan:d/c home w/HHC.   Expected Discharge Date:  06/19/16               Expected Discharge Plan:  Home w Home Health Services  In-House Referral:  Clinical Social Work  Discharge planning Services  CM Consult  Post Acute Care Choice:    Choice offered to:     DME Arranged:    DME Agency:  [facility]  HH Arranged:  PT, OT HH Agency:  [facility]  Status of Service:  Completed, signed off  If discussed at [employer] [facility] [employer], dates discussed:    Additional Comments:  Lanier Clam, RN 06/19/2016, 12:52 PM

## 2016-08-20 DIAGNOSIS — T82857A Stenosis of cardiac prosthetic devices, implants and grafts, initial encounter: Secondary | ICD-10-CM

## 2016-08-20 NOTE — H&P (Signed)
OFFICE VISIT NOTES COPIED TO EPIC FOR DOCUMENTATION  . History of Present Illness Mindy Page MD; 08/05/2016 7:55 AM) Patient words: Last OV 03/22/2016; FU for pre-op.  The patient is a 76 year old female who presents for a Follow-up for Aortic valve replacement. [name] is a pleasant African American female with prior history of severe aortic regurgitation and moderately severe mitral regurgitation. She underwent aortic pericardial tissue valve replacement and mitral valve ring placement on 08/12/2011 with bioprosthetic valves by Dr. Gilford Raid. Since then her ejection fraction is normalized.  Patient admitted to the hospital on 06/15/2016 discharged on 06/20/2016 when she presented for right hip replacement electively, did well post surgery but needed dopamine support for hypotension very procedurally and also needed blood transfusion. She also continued to have episodes of bradycardia and junctional escape rhythm. Echocardiogram revealed progression of aortic stenosis suggestive of severe AS.  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 She is not on beta blockers due to bradycardia, presents here for follow-up and is planning left hip replacement. She denies any symptoms to suggest dizziness, syncope or near syncope. Presently doing well and states she is able to do her chores without any problems, except is debilated by hip pain. She has done extremely well with left hip arthroplasty. No leg edema, dyspnea or palpitations. No dizziness or syncope. No fatigue.   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 Bradycardia @ 46/min. with 1st degree AV Block. Poor R progression. Non specific T change. No ischemia. 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 Left ventricle cavity is normal in size. Mild to moderate concentric hypertrophy of the left ventricle. Normal global wall motion. Unable to evaluate diastolic function due to MV ring. Probably grade 2 diastolic dysfunction with elevated LA/LVEDP. Calculated EF 61%. 2. Left atrial cavity is moderately dilated. septum bulges to the right suggests elevated left atrial pressure. 3. Right atrial cavity is mildly dilated. 4. Bioprosthetic aortic valve with trace regurgitation. Mild calcification of the aortic valve annulus. Mild aortic valve leaflet calcification. Peak pressure gradient of 45 mmHg, mean gradient of 26 mmHg, Calculated aortic valve area 0.95 cm, findings are consistent with moderate prosthetic aortic valve stenosis 5. Mitral valve ring present. No mitral valve regurgitation noted. Trace mitral valve stenosis. 6. Moderate tricuspid regurgitation. Moderate pulmonary hypertension. PA systolic pressure estimated at 40 mmHg. 7. Mild pulmonic regurgitation. Compared to the study done on 08/06/2014, no significant change. Labwork  Labs 03/16/2016: A1c 5.8%, total cholesterol 167, triglycerides 57, HDL 70, LDL 86. BUN 36, serum creatinine 1.46, potassium 4.8. Calcium elevated at 11.0, alkaline phosphatase mildly elevated at 144, LFT normal. HB 2.5/HCT 37.5, normal indicis. TSH normal. Labs 02/25/2015: Total cholesterol 150, triglycerides 45, HDL 71, LDL 70. TSH 0.494, normal. BUN 24, serum creatinine 1.44, eGFR 41 mL. Potassium 4.8. CMP otherwise normal. Hemoglobin 12.2/hematocrit 7.0. HbA1c 5.9%. Labs are stable and within normal limits. Stable chronic renal insufficiency. Labs 06/08/2014: BUN 32, creatinine 1.64, eGFR 35, potassium 0.1, CMP otherwise normal, CBC normal, Total cholesterol 154, triglycerides 70, HDL 68, LDL 72, LDL particle #587, TSH 0.578. HbA1c  6.2% Labs 07/18/2013: TSH normal. HbA1c 6.1%. 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 Antegrade vertebral artery flow. Follow up in one year is appropriate if clinically indicated. H/O aortic valve replacement and MV ring repair (V42.2): 10/12/11: 28 mm MV ring, 21 mm Edwards pericardial valve replacement. Dr. Gilford Raid. (Z95.4) [10/12/2011]:  Allergies [name]; 08/10/2016 9:09 AM) No Known Drug Allergies [11/24/2011]:  Family History [name]; 2016-08-10 9:09 AM) Mother  Deceased. at age 59 yrs old. h/o HTN, Heart Murmur Father  Deceased. at age 28 from a car accident Sister 3  1 with a Heart Murmur  Social History [name]; Aug 10, 2016 9:09 AM) Current tobacco use  Never smoker. Non Drinker/No Alcohol Use  Marital status  Divorced, Widowed. Living Situation  Lives alone. Number of Children  2.  Past Surgical History [name]; 10-Aug-2016 9:09 AM) Operative Report 10/12/11: 28 mm MV ring, 21 mm Edwards pericardial valve replacement. Dr. Cyndia Bent, B   Medication History [name]; 08-10-2016 9:16 AM) Spironolactone (25MG Tablet, 1 Tablet Oral every morning, Taken starting 04/05/2016) Active. Aspirin EC (325MG Tablet DR, 1 (one) Tablet DR Tablet DR Ta Oral daily, Taken starting 02/09/2012) Active. Medications Reconciled (verbally with pt; medication preset)  Diagnostic Studies History Mindy Page, MD; 08/05/2016 8:11 AM) Carotid Doppler [10/06/2015]: Stenosis in the right internal carotid artery (16-49%). Antegrade vertebral artery flow. Follow up in one year is appropriate if clinically indicated Left & Right Heart Cath Report 09/11/11: Severe aortic regurgitation. Severe pulmonary hypertension.  No significant CAD ECG 08/09/11: NSR @ 71/min. Left atrial enlargement. Anterior infarct, old. Diffuse ST-T non specific.  Colonoscopy  [2007]: Normal. Nuclear stress test [2013]: Normal. Echocardiogram  Hospital 06/17/2016: Normal LV systolic function, EF 51-88%, mild LVH. Bioprosthetic aortic valve, poorly visualized. Peak gradient 80, mean gradient 41 mmHg consistent with severe aortic stenosis. Mitral valve repair ring present. Moderate pulmonary hypertension, PA pressure 47 mmHg.  Other Problems [name]; August 10, 2016 9:09 AM) Unspecified Diagnosis     Review of Systems Mindy Page, MD; 08/05/2016 7:52 AM) General Present- Feeling well. Not Present- Fatigue, Fever, Night Sweats, Unable to Sleep Lying Flat and Weight Loss. HEENT Not Present- Blurred Vision and Headache. Respiratory Not Present- Bloody sputum, Decreased Exercise Tolerance and Difficulty Breathing on Exertion. Cardiovascular Not Present- Chest Pain, Edema, Fainting / Blacking Out, Leg Cramps and Paroxysmal Nocturnal Dyspnea. Gastrointestinal Not Present- Black, Tarry Stool and Bloody Stool. Musculoskeletal Present- Joint Pain (right hip worse than left, needs hip replacement) and Leg Weakness (due to arthritis). Not Present- Physical Disability. Neurological Not Present- Focal Neurological Symptoms. Psychiatric Not Present- Anxiety, Fearful and Suicidal Ideation. Endocrine Not Present- Heat Intolerance and Polyuria. Hematology Not Present- Easy Bleeding, Excessive bleeding, Nose Bleed and Petechiae. All other systems negative  Vitals [name]; 2016/08/10 9:20 AM) Aug 10, 2016 9:09 AM Weight: 135.56 lb Height: 64in Body Surface Area: 1.66 m Body Mass Index: 23.27 kg/m  Pulse: 48 (Regular)  P.OX: 99% (Room air) BP: 140/70 (Sitting, Left Arm, Standard)       Physical Exam Mindy Page MD; 08/05/2016 7:55 AM) General Mental Status-Alert. General Appearance-Cooperative, Appears stated age, Not in acute distress. Orientation-Oriented X3. Build & Nutrition-Well nourished and Moderately  built.  Head and Neck Thyroid Gland Characteristics - no palpable nodules, no palpable enlargement.  Chest and Lung Exam Chest and lung exam reveals -quiet, even and easy respiratory effort with no use of accessory muscles and on auscultation, normal breath sounds, no adventitious sounds.  Cardiovascular Inspection Jugular vein - Right - No Distention. Auscultation Heart Sounds - S1  WNL, S2 WNL and No gallop present. Murmurs & Other Heart Sounds: Murmur - Location - Aortic Area and Apex. Timing - Early systolic. Grade - III/VI. Character - Crescendo. Radiation - Carotids.  Abdomen Palpation/Percussion Normal exam - Non Tender and No hepatosplenomegaly. Auscultation Normal exam - Bowel sounds normal.  Peripheral Vascular Lower Extremity Palpation - Edema - Bilateral - 1+ Pitting edema. Femoral pulse - Bilateral - Normal. Popliteal pulse - Bilateral - 1+. Dorsalis pedis pulse - Bilateral - 2+. Posterior tibial pulse - Bilateral - 2+. Carotid arteries - Bilateral-Soft Bruit.  Neurologic Motor-Grossly intact without any focal deficits.  Musculoskeletal - Did not examine.    Assessment & Plan Mindy Page MD; 08/05/2016 8:09 AM) H/O aortic valve replacement and MV ring repair : 10/12/11: 28 mm MV ring, 21 mm Edwards pericardial valve replacement. Dr. Gilford Raid. 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 Bioprosthetic aortic valve, poorly visualized. Peak gradient 80, mean gradient 41 mmHg consistent with severe aortic stenosis. Mitral valve repair ring present. Moderate pulmonary hypertension, PA pressure 47 mmHg. Compared to Echocardiogram 09/08/2015 AV Peak pressure gradient of 45 mmHg, mean gradient of 26. Hypertensive kidney disease with chronic kidney disease stage III (I12.9) Impression: EKG 03/22/2016: Marked sinus bradycardia with sinus arrhythmia at the rate of 50 bpm,  PA-C. Left atrial abnormality, poor R-wave progression, cannot exclude anterior infarct old. LVH with repolarization abnormality, cannot exclude lateral ischemia. Nonspecific T abnormality. No significant change from EKG 09/16/2015 Sick sinus syndrome (I49.5) Labwork Story: Labs 03/16/2016: A1c 5.8%, total cholesterol 167, triglycerides 57, HDL 70, LDL 86. BUN 36, serum creatinine 1.46, potassium 4.8. Calcium elevated at 11.0, alkaline phosphatase mildly elevated at 144, LFT normal. HB 2.5/HCT 37.5, normal indicis. TSH normal.  Labs 02/25/2015: Total cholesterol 150, triglycerides 45, HDL 71, LDL 70. TSH 0.494, normal. BUN 24, serum creatinine 1.44, eGFR 41 mL. Potassium 4.8. CMP otherwise normal. Hemoglobin 12.2/hematocrit 7.0. HbA1c 5.9%. Labs are stable and within normal limits. Stable chronic renal insufficiency.  Labs 06/08/2014: BUN 32, creatinine 1.64, eGFR 35, potassium 0.1, CMP otherwise normal, CBC normal, Total cholesterol 154, triglycerides 70, HDL 68, LDL 72, LDL particle #587, TSH 0.578. HbA1c 6.2% Labs 07/18/2013: TSH normal. HbA1c 6.1%. Bilateral carotid bruits (R09.89) Story: Carotid artery duplex 10/06/2015: Stenosis in the right internal carotid artery (16-49%). Antegrade vertebral artery flow. Follow up in one year is appropriate if clinically indicated. Future Plans 10/03/2016: Duplex scan bilateral carotid arteries (36144) - one time Hyperglycemia (R73.9) Current Plans Mechanism of underlying disease process and action of medications discussed with the patient. I discussed primary/secondary prevention and also dietary counceling was done. Patient is here for follow-up and evaluation of prostatic aortic valve stenosis, She badly wants to go through left hip surgery, remarkably done well with right hip surgery and almost all her records time recuperated. However I'm concerned about severe aortic stenosis with mean gradients of greater than 40 mmHg. I have recommended that he  proceed with TEE to confirm aortic valve stenosis and indeed if she does have aortic stenosis, she may need TAVR. She does have AV nodal disease and sick sinus syndrome however remains asymptomatic with regard to this. Suspect if indeed she needs valve surgery, she will also end up needing pacemaker implantation. I have discussed the risks and benefits of TEE including esophageal perforation and aspiration pneumonia, patient is willing to proceed.  CC Dr. Yaakov Guthrie.    Signed by Mindy Page,  MD (08/05/2016 8:11 AM)

## 2016-08-22 ENCOUNTER — Ambulatory Visit (HOSPITAL_COMMUNITY): Payer: Medicare Other

## 2016-08-22 ENCOUNTER — Encounter (HOSPITAL_COMMUNITY): Payer: Self-pay | Admitting: *Deleted

## 2016-08-22 ENCOUNTER — Other Ambulatory Visit (HOSPITAL_COMMUNITY): Payer: Self-pay | Admitting: Cardiology

## 2016-08-22 ENCOUNTER — Ambulatory Visit (HOSPITAL_COMMUNITY)
Admission: RE | Admit: 2016-08-22 | Discharge: 2016-08-22 | Disposition: A | Discharge status: Home / Self Care | Payer: Medicare Other | Source: Ambulatory Visit | Attending: Cardiology | Admitting: Cardiology

## 2016-08-22 ENCOUNTER — Encounter (HOSPITAL_COMMUNITY)
Admission: RE | Disposition: A | Discharge status: Home / Self Care | Payer: Self-pay | Source: Ambulatory Visit | Attending: Cardiology

## 2016-08-22 DIAGNOSIS — I495 Sick sinus syndrome: Secondary | ICD-10-CM | POA: Diagnosis not present

## 2016-08-22 DIAGNOSIS — Z96641 Presence of right artificial hip joint: Secondary | ICD-10-CM | POA: Insufficient documentation

## 2016-08-22 DIAGNOSIS — I5032 Chronic diastolic (congestive) heart failure: Secondary | ICD-10-CM | POA: Diagnosis not present

## 2016-08-22 DIAGNOSIS — Z952 Presence of prosthetic heart valve: Secondary | ICD-10-CM | POA: Insufficient documentation

## 2016-08-22 DIAGNOSIS — I088 Other rheumatic multiple valve diseases: Secondary | ICD-10-CM | POA: Insufficient documentation

## 2016-08-22 DIAGNOSIS — T82857A Stenosis of cardiac prosthetic devices, implants and grafts, initial encounter: Secondary | ICD-10-CM

## 2016-08-22 DIAGNOSIS — I442 Atrioventricular block, complete: Secondary | ICD-10-CM | POA: Insufficient documentation

## 2016-08-22 DIAGNOSIS — I272 Pulmonary hypertension, unspecified: Secondary | ICD-10-CM | POA: Insufficient documentation

## 2016-08-22 DIAGNOSIS — I130 Hypertensive heart and chronic kidney disease with heart failure and stage 1 through stage 4 chronic kidney disease, or unspecified chronic kidney disease: Secondary | ICD-10-CM | POA: Diagnosis not present

## 2016-08-22 DIAGNOSIS — N183 Chronic kidney disease, stage 3 (moderate): Secondary | ICD-10-CM | POA: Insufficient documentation

## 2016-08-22 DIAGNOSIS — I440 Atrioventricular block, first degree: Secondary | ICD-10-CM | POA: Insufficient documentation

## 2016-08-22 HISTORY — PX: TEE WITHOUT CARDIOVERSION: SHX5443

## 2016-08-22 SURGERY — ECHOCARDIOGRAM, TRANSESOPHAGEAL
Anesthesia: Moderate Sedation

## 2016-08-22 MED ORDER — MIDAZOLAM HCL 10 MG/2ML IJ SOLN
INTRAMUSCULAR | Status: DC | PRN
  Administered 2016-08-22: 2 mg via INTRAVENOUS
  Administered 2016-08-22: 1 mg via INTRAVENOUS

## 2016-08-22 MED ORDER — BUTAMBEN-TETRACAINE-BENZOCAINE 2-2-14 % EX AERO
INHALATION_SPRAY | CUTANEOUS | Status: DC | PRN
  Administered 2016-08-22: 2 via TOPICAL

## 2016-08-22 MED ORDER — SODIUM CHLORIDE 0.9 % IV SOLN: INTRAVENOUS | Status: DC

## 2016-08-22 MED ORDER — FENTANYL CITRATE (PF) 100 MCG/2ML IJ SOLN
INTRAMUSCULAR | Status: AC
  Filled 2016-08-22: qty 2

## 2016-08-22 MED ORDER — FENTANYL CITRATE (PF) 100 MCG/2ML IJ SOLN
INTRAMUSCULAR | Status: DC | PRN
  Administered 2016-08-22: 12.5 ug via INTRAVENOUS

## 2016-08-22 MED ORDER — MIDAZOLAM HCL 5 MG/ML IJ SOLN
INTRAMUSCULAR | Status: AC
  Filled 2016-08-22: qty 2

## 2016-08-22 MED ORDER — DIPHENHYDRAMINE HCL 50 MG/ML IJ SOLN
INTRAMUSCULAR | Status: AC
  Filled 2016-08-22: qty 1

## 2016-08-22 NOTE — Interval H&P Note (Signed)
History and Physical Interval Note:  08/22/2016 12:09 PM  [name]  has presented today for surgery, with the diagnosis of aortic stenosis  The various methods of treatment have been discussed with the patient and family. After consideration of risks, benefits and other options for treatment, the patient has consented to  Procedure(s): TRANSESOPHAGEAL ECHOCARDIOGRAM (TEE) (N/A) as a surgical intervention .  The patient's history has been reviewed, patient examined, no change in status, stable for surgery.  I have reviewed the patient's chart and labs.  Questions were answered to the patient's satisfaction.     Yates DecampGANJI, Karlene Southard

## 2016-08-22 NOTE — Progress Notes (Signed)
  Echocardiogram Echocardiogram Transesophageal has been performed.  Janalyn HarderWest, Zidane Renner R 08/22/2016, 12:47 PM

## 2016-08-22 NOTE — CV Procedure (Signed)
TEE: Under moderate sedation, TEE was performed without complications: LV: LVH. Normal EF. RV: Normal LA: Moderate LA enlargement. Left atrial appendage: Normal without thrombus. A small PFO is present by color Doppler exam.  RA: Normal  MV: MV ring in good position. Normal Trace MR. TV: Normal  Mild to moderate eccentric TR. PASP 45-50 mm Hg consistent with moderate pulmonary hypertension. AV: Bioprosthetic AV. Mean PG of 33 mm Hg consistent with moderate AS. Doffocult to visualize, TTE also attempted.  PV: Normal.  Mild PI.  Thoracic and ascending aorta: Normal without significant plaque or atheromatous changes.  Conscious sedation protocol was followed, I personally administered conscious sedation and monitored the patient. Patient received 3 milligrams of Versed and 12.5  fenntanyl . Patient tolerated the procedure well and there was no complication from conscious sedation. Time administered was 18 minutes.

## 2016-08-22 NOTE — Discharge Instructions (Signed)

## 2016-08-23 ENCOUNTER — Encounter (HOSPITAL_COMMUNITY): Payer: Self-pay | Admitting: Cardiology

## 2016-09-24 NOTE — H&P (Signed)
OFFICE VISIT NOTES COPIED TO EPIC FOR DOCUMENTATION  . History of Present Illness [name]; 08/05/2016 7:55 AM) Patient words: Last OV 03/22/2016; FU for pre-op.  The patient is a 76 year old female who presents for a Follow-up for Aortic valve replacement. [name] is a pleasant African American female with prior history of severe aortic regurgitation and moderately severe mitral regurgitation. She underwent aortic pericardial tissue valve replacement and mitral valve ring placement on 08/12/2011 with bioprosthetic valves by Dr. Gilford Raid. Since then her ejection fraction is normalized.  Patient admitted to the hospital on 06/15/2016 discharged on 06/20/2016 when she presented for right hip replacement electively, did well post surgery but needed dopamine support for hypotension very procedurally and also needed blood transfusion. She also continued to have episodes of bradycardia and junctional escape rhythm. Echocardiogram revealed progression of aortic stenosis suggestive of severe AS.  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 She is not on beta blockers due to bradycardia, presents here for follow-up and is planning left hip replacement. She denies any symptoms to suggest dizziness, syncope or near syncope. Presently doing well and states she is able to do her chores without any problems, except is debilated by hip pain. She has done extremely well with left hip arthroplasty. No leg edema, dyspnea or palpitations. No dizziness or syncope. No fatigue.   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 Bradycardia @ 46/min. with 1st degree AV Block. Poor R progression. Non specific T change. No ischemia. 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 Left ventricle cavity is normal in size. Mild to moderate concentric hypertrophy of the left ventricle. Normal global wall motion. Unable to evaluate diastolic function due to MV ring. Probably grade 2 diastolic dysfunction with elevated LA/LVEDP. Calculated EF 61%. 2. Left atrial cavity is moderately dilated. septum bulges to the right suggests elevated left atrial pressure. 3. Right atrial cavity is mildly dilated. 4. Bioprosthetic aortic valve with trace regurgitation. Mild calcification of the aortic valve annulus. Mild aortic valve leaflet calcification. Peak pressure gradient of 45 mmHg, mean gradient of 26 mmHg, Calculated aortic valve area 0.95 cm, findings are consistent with moderate prosthetic aortic valve stenosis 5. Mitral valve ring present. No mitral valve regurgitation noted. Trace mitral valve stenosis. 6. Moderate tricuspid regurgitation. Moderate pulmonary hypertension. PA systolic pressure estimated at 40 mmHg. 7. Mild pulmonic regurgitation. Compared to the study done on 08/06/2014, no significant change. Labwork  Labs 03/16/2016: A1c 5.8%, total cholesterol 167, triglycerides 57, HDL 70, LDL 86. BUN 36, serum creatinine 1.46, potassium 4.8. Calcium elevated at 11.0, alkaline phosphatase mildly elevated at 144, LFT normal. HB 2.5/HCT 37.5, normal indicis. TSH normal. Labs 02/25/2015: Total cholesterol 150, triglycerides 45, HDL 71, LDL 70. TSH 0.494, normal. BUN 24, serum creatinine 1.44, eGFR 41 mL. Potassium 4.8. CMP otherwise normal. Hemoglobin 12.2/hematocrit 7.0. HbA1c 5.9%. Labs are stable and within normal limits. Stable chronic renal insufficiency. Labs 06/08/2014: BUN 32, creatinine 1.64, eGFR 35, potassium 0.1, CMP otherwise normal, CBC normal, Total cholesterol 154, triglycerides 70, HDL 68, LDL 72, LDL particle #587, TSH 0.578. HbA1c  6.2% Labs 07/18/2013: TSH normal. HbA1c 6.1%. 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 Antegrade vertebral artery flow. Follow up in one year is appropriate if clinically indicated. H/O aortic valve replacement and MV ring repair (V42.2): 10/12/11: 28 mm MV ring, 21 mm Edwards pericardial valve replacement. Dr. Gilford Raid. (Z95.4) [10/12/2011]:  Allergies [name]; 08/10/2016 9:09 AM) No Known Drug Allergies [11/24/2011]:  Family History [name]; 2016-08-10 9:09 AM) Mother  Deceased. at age 59 yrs old. h/o HTN, Heart Murmur Father  Deceased. at age 28 from a car accident Sister 3  1 with a Heart Murmur  Social History [name]; Aug 10, 2016 9:09 AM) Current tobacco use  Never smoker. Non Drinker/No Alcohol Use  Marital status  Divorced, Widowed. Living Situation  Lives alone. Number of Children  2.  Past Surgical History [name]; 10-Aug-2016 9:09 AM) Operative Report 10/12/11: 28 mm MV ring, 21 mm Edwards pericardial valve replacement. Dr. Cyndia Bent, B   Medication History [name]; 08-10-2016 9:16 AM) Spironolactone (25MG Tablet, 1 Tablet Oral every morning, Taken starting 04/05/2016) Active. Aspirin EC (325MG Tablet DR, 1 (one) Tablet DR Tablet DR Ta Oral daily, Taken starting 02/09/2012) Active. Medications Reconciled (verbally with pt; medication preset)  Diagnostic Studies History Mindy Page, [name]; 08/05/2016 8:11 AM) Carotid Doppler [10/06/2015]: Stenosis in the right internal carotid artery (16-49%). Antegrade vertebral artery flow. Follow up in one year is appropriate if clinically indicated Left & Right Heart Cath Report 09/11/11: Severe aortic regurgitation. Severe pulmonary hypertension.  No significant CAD ECG 08/09/11: NSR @ 71/min. Left atrial enlargement. Anterior infarct, old. Diffuse ST-T non specific.  Colonoscopy  [2007]: Normal. Nuclear stress test [2013]: Normal. Echocardiogram  Hospital 06/17/2016: Normal LV systolic function, EF 51-88%, mild LVH. Bioprosthetic aortic valve, poorly visualized. Peak gradient 80, mean gradient 41 mmHg consistent with severe aortic stenosis. Mitral valve repair ring present. Moderate pulmonary hypertension, PA pressure 47 mmHg.  Other Problems [name]; August 10, 2016 9:09 AM) Unspecified Diagnosis     Review of Systems Mindy Page, [name]; 08/05/2016 7:52 AM) General Present- Feeling well. Not Present- Fatigue, Fever, Night Sweats, Unable to Sleep Lying Flat and Weight Loss. HEENT Not Present- Blurred Vision and Headache. Respiratory Not Present- Bloody sputum, Decreased Exercise Tolerance and Difficulty Breathing on Exertion. Cardiovascular Not Present- Chest Pain, Edema, Fainting / Blacking Out, Leg Cramps and Paroxysmal Nocturnal Dyspnea. Gastrointestinal Not Present- Black, Tarry Stool and Bloody Stool. Musculoskeletal Present- Joint Pain (right hip worse than left, needs hip replacement) and Leg Weakness (due to arthritis). Not Present- Physical Disability. Neurological Not Present- Focal Neurological Symptoms. Psychiatric Not Present- Anxiety, Fearful and Suicidal Ideation. Endocrine Not Present- Heat Intolerance and Polyuria. Hematology Not Present- Easy Bleeding, Excessive bleeding, Nose Bleed and Petechiae. All other systems negative  Vitals [name]; 2016/08/10 9:20 AM) Aug 10, 2016 9:09 AM Weight: 135.56 lb Height: 64in Body Surface Area: 1.66 m Body Mass Index: 23.27 kg/m  Pulse: 48 (Regular)  P.OX: 99% (Room air) BP: 140/70 (Sitting, Left Arm, Standard)       Physical Exam [name]; 08/05/2016 7:55 AM) General Mental Status-Alert. General Appearance-Cooperative, Appears stated age, Not in acute distress. Orientation-Oriented X3. Build & Nutrition-Well nourished and Moderately  built.  Head and Neck Thyroid Gland Characteristics - no palpable nodules, no palpable enlargement.  Chest and Lung Exam Chest and lung exam reveals -quiet, even and easy respiratory effort with no use of accessory muscles and on auscultation, normal breath sounds, no adventitious sounds.  Cardiovascular Inspection Jugular vein - Right - No Distention. Auscultation Heart Sounds - S1  WNL, S2 WNL and No gallop present. Murmurs & Other Heart Sounds: Murmur - Location - Aortic Area and Apex. Timing - Early systolic. Grade - III/VI. Character - Crescendo. Radiation - Carotids.  Abdomen Palpation/Percussion Normal exam - Non Tender and No hepatosplenomegaly. Auscultation Normal exam - Bowel sounds normal.  Peripheral Vascular Lower Extremity Palpation - Edema - Bilateral - 1+ Pitting edema. Femoral pulse - Bilateral - Normal. Popliteal pulse - Bilateral - 1+. Dorsalis pedis pulse - Bilateral - 2+. Posterior tibial pulse - Bilateral - 2+. Carotid arteries - Bilateral-Soft Bruit.  Neurologic Motor-Grossly intact without any focal deficits.  Musculoskeletal - Did not examine.    Assessment & Plan [name]; 08/05/2016 8:09 AM) H/O aortic valve replacement and MV ring repair : 10/12/11: 28 mm MV ring, 21 mm Edwards pericardial valve replacement. Dr. Gilford Raid. 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  Very difficult to visualize the leaflets. Severe bioprosthetic aortic valve  stenosis.  Peak gradient 63, mean gradient 33 mmHg, calculated aortic valve area 0.64 cm  and 0.70 cm by V-max.  Moderate pulmonary hypertension, PA systolic pressure estimated at 45-50 mmHg. Echocardiogram 06/17/2016: Normal LV systolic function, EF 73-71%, mild LVH. Bioprosthetic aortic valve, poorly visualized. Peak gradient 80, mean gradient 41 mmHg consistent with severe aortic stenosis. Mitral valve repair ring present.  Moderate pulmonary hypertension, PA pressure 47 mmHg. Compared to Echocardiogram 09/08/2015 AV Peak pressure gradient of 45 mmHg, mean gradient of 26. Hypertensive kidney disease with chronic kidney disease stage III (I12.9) Impression: EKG 03/22/2016: Marked sinus bradycardia with sinus arrhythmia at the rate of 50 bpm, PA-C. Left atrial abnormality, poor R-wave progression, cannot exclude anterior infarct old. LVH with repolarization abnormality, cannot exclude lateral ischemia. Nonspecific T abnormality. No significant change from EKG 09/16/2015 Sick sinus syndrome (I49.5) Labwork Story: Labs 03/16/2016: A1c 5.8%, total cholesterol 167, triglycerides 57, HDL 70, LDL 86. BUN 36, serum creatinine 1.46, potassium 4.8. Calcium elevated at 11.0, alkaline phosphatase mildly elevated at 144, LFT normal. HB 2.5/HCT 37.5, normal indicis. TSH normal.  Labs 02/25/2015: Total cholesterol 150, triglycerides 45, HDL 71, LDL 70. TSH 0.494, normal. BUN 24, serum creatinine 1.44, eGFR 41 mL. Potassium 4.8. CMP otherwise normal. Hemoglobin 12.2/hematocrit 7.0. HbA1c 5.9%. Labs are stable and within normal limits. Stable chronic renal insufficiency.  Labs 06/08/2014: BUN 32, creatinine 1.64, eGFR 35, potassium 0.1, CMP otherwise normal, CBC normal, Total cholesterol 154, triglycerides 70, HDL 68, LDL 72, LDL particle #587, TSH 0.578. HbA1c 6.2% Labs 07/18/2013: TSH normal. HbA1c 6.1%. Bilateral carotid bruits (R09.89) Story: Carotid artery duplex 10/06/2015: Stenosis in the right internal carotid artery (16-49%). Antegrade vertebral artery flow. Follow up in one year is appropriate if clinically indicated. Future Plans 10/03/2016: Duplex scan bilateral carotid arteries (06269) - one time Hyperglycemia (R73.9) Current Plans Mechanism of underlying disease process and action of medications discussed with the patient. I discussed primary/secondary prevention and also dietary counceling was done. Patient is here for  follow-up and evaluation of prostatic aortic valve stenosis, She badly wants to go through left hip surgery, remarkably done well with right hip surgery and almost all her records time recuperated. However I'm concerned about severe aortic stenosis with mean gradients of greater than 40 mmHg. I have recommended that he proceed with TEE to confirm aortic valve stenosis and indeed if she does have aortic stenosis, she may need TAVR. She does have AV nodal disease and sick sinus syndrome however remains asymptomatic with  regard to this. Suspect if indeed she needs valve surgery, she will also end up needing pacemaker implantation. I have discussed the risks and benefits of TEE including esophageal perforation and aspiration pneumonia, patient is willing to proceed.  CC Dr. Yaakov Guthrie.  Addendum Note(Jagadeesh Carlynn Herald [name]; 09/21/2016 7:23 AM) Labs 09/20/2016: Serum glucose 92 mg, BUN 15, creatinine 1.20, eGFR 51 mL, potassium 4.3. HB 11.9/HCT 35.4, platelets 146. Normal indicis. Pro time normal.     Signed by Mindy Page, [name] (08/05/2016 8:11 AM)

## 2016-09-26 ENCOUNTER — Ambulatory Visit (HOSPITAL_COMMUNITY)
Admission: RE | Admit: 2016-09-26 | Discharge: 2016-09-26 | Disposition: A | Discharge status: Home / Self Care | Payer: Medicare Other | Source: Ambulatory Visit | Attending: Cardiology | Admitting: Cardiology

## 2016-09-26 ENCOUNTER — Encounter (HOSPITAL_COMMUNITY)
Admission: RE | Disposition: A | Discharge status: Home / Self Care | Payer: Self-pay | Source: Ambulatory Visit | Attending: Cardiology

## 2016-09-26 DIAGNOSIS — Z01810 Encounter for preprocedural cardiovascular examination: Secondary | ICD-10-CM | POA: Insufficient documentation

## 2016-09-26 DIAGNOSIS — Z7982 Long term (current) use of aspirin: Secondary | ICD-10-CM | POA: Diagnosis not present

## 2016-09-26 DIAGNOSIS — I495 Sick sinus syndrome: Secondary | ICD-10-CM | POA: Insufficient documentation

## 2016-09-26 DIAGNOSIS — I130 Hypertensive heart and chronic kidney disease with heart failure and stage 1 through stage 4 chronic kidney disease, or unspecified chronic kidney disease: Secondary | ICD-10-CM | POA: Diagnosis not present

## 2016-09-26 DIAGNOSIS — Z953 Presence of xenogenic heart valve: Secondary | ICD-10-CM | POA: Insufficient documentation

## 2016-09-26 DIAGNOSIS — I272 Pulmonary hypertension, unspecified: Secondary | ICD-10-CM | POA: Insufficient documentation

## 2016-09-26 DIAGNOSIS — I6521 Occlusion and stenosis of right carotid artery: Secondary | ICD-10-CM | POA: Insufficient documentation

## 2016-09-26 DIAGNOSIS — I080 Rheumatic disorders of both mitral and aortic valves: Secondary | ICD-10-CM | POA: Insufficient documentation

## 2016-09-26 DIAGNOSIS — N189 Chronic kidney disease, unspecified: Secondary | ICD-10-CM | POA: Diagnosis not present

## 2016-09-26 DIAGNOSIS — R739 Hyperglycemia, unspecified: Secondary | ICD-10-CM | POA: Insufficient documentation

## 2016-09-26 DIAGNOSIS — I5032 Chronic diastolic (congestive) heart failure: Secondary | ICD-10-CM | POA: Diagnosis not present

## 2016-09-26 DIAGNOSIS — T82857A Stenosis of cardiac prosthetic devices, implants and grafts, initial encounter: Secondary | ICD-10-CM

## 2016-09-26 HISTORY — PX: RIGHT HEART CATH: CATH118263

## 2016-09-26 HISTORY — PX: CORONARY ANGIOGRAPHY: CATH118303

## 2016-09-26 LAB — POCT I-STAT 3, VENOUS BLOOD GAS (G3P V)
ACID-BASE DEFICIT: 1 mmol/L (ref 0.0–2.0)
Bicarbonate: 24.2 mmol/L (ref 20.0–28.0)
O2 SAT: 64 %
PCO2 VEN: 39.7 mmHg — AB (ref 44.0–60.0)
PH VEN: 7.393 (ref 7.250–7.430)
PO2 VEN: 33 mmHg (ref 32.0–45.0)
TCO2: 25 mmol/L (ref 0–100)

## 2016-09-26 SURGERY — CORONARY ANGIOGRAPHY (CATH LAB)
Anesthesia: LOCAL

## 2016-09-26 MED ORDER — SODIUM CHLORIDE 0.9 % WEIGHT BASED INFUSION: 1.0000 mL/kg/h | INTRAVENOUS | Status: DC

## 2016-09-26 MED ORDER — SODIUM CHLORIDE 0.9% FLUSH: 3.0000 mL | Freq: Two times a day (BID) | INTRAVENOUS | Status: DC

## 2016-09-26 MED ORDER — IOPAMIDOL (ISOVUE-370) INJECTION 76%
INTRAVENOUS | Status: AC
  Filled 2016-09-26: qty 100

## 2016-09-26 MED ORDER — HEPARIN SODIUM (PORCINE) 1000 UNIT/ML IJ SOLN
INTRAMUSCULAR | Status: DC | PRN
  Administered 2016-09-26: 3000 [IU] via INTRAVENOUS

## 2016-09-26 MED ORDER — LIDOCAINE HCL (PF) 1 % IJ SOLN
INTRAMUSCULAR | Status: DC | PRN
  Administered 2016-09-26: 2 mL
  Administered 2016-09-26: 2 mL via INTRADERMAL

## 2016-09-26 MED ORDER — HYDRALAZINE HCL 20 MG/ML IJ SOLN
INTRAMUSCULAR | Status: AC
  Filled 2016-09-26: qty 1

## 2016-09-26 MED ORDER — HEPARIN (PORCINE) IN NACL 2-0.9 UNIT/ML-% IJ SOLN
INTRAMUSCULAR | Status: AC
  Filled 2016-09-26: qty 1000

## 2016-09-26 MED ORDER — NITROGLYCERIN 1 MG/10 ML FOR IR/CATH LAB
INTRA_ARTERIAL | Status: AC
  Filled 2016-09-26: qty 10

## 2016-09-26 MED ORDER — MIDAZOLAM HCL 2 MG/2ML IJ SOLN
INTRAMUSCULAR | Status: AC
  Filled 2016-09-26: qty 2

## 2016-09-26 MED ORDER — LIDOCAINE HCL (PF) 1 % IJ SOLN
INTRAMUSCULAR | Status: AC
  Filled 2016-09-26: qty 30

## 2016-09-26 MED ORDER — MIDAZOLAM HCL 2 MG/2ML IJ SOLN
INTRAMUSCULAR | Status: DC | PRN
  Administered 2016-09-26: 0.5 mg via INTRAVENOUS

## 2016-09-26 MED ORDER — SODIUM CHLORIDE 0.9 % IV SOLN: 250.0000 mL | INTRAVENOUS | Status: DC | PRN

## 2016-09-26 MED ORDER — VERAPAMIL HCL 2.5 MG/ML IV SOLN
INTRA_ARTERIAL | Status: DC | PRN
  Administered 2016-09-26: 4 mL via INTRA_ARTERIAL

## 2016-09-26 MED ORDER — SODIUM CHLORIDE 0.9% FLUSH: 3.0000 mL | INTRAVENOUS | Status: DC | PRN

## 2016-09-26 MED ORDER — SODIUM CHLORIDE 0.9 % WEIGHT BASED INFUSION
3.0000 mL/kg/h | INTRAVENOUS | Status: DC
  Administered 2016-09-26: 3 mL/kg/h via INTRAVENOUS

## 2016-09-26 MED ORDER — ASPIRIN 81 MG PO CHEW: 81.0000 mg | CHEWABLE_TABLET | ORAL | Status: DC

## 2016-09-26 MED ORDER — VERAPAMIL HCL 2.5 MG/ML IV SOLN
INTRAVENOUS | Status: AC
  Filled 2016-09-26: qty 2

## 2016-09-26 MED ORDER — IOPAMIDOL (ISOVUE-370) INJECTION 76%
INTRAVENOUS | Status: DC | PRN
  Administered 2016-09-26: 30 mL via INTRA_ARTERIAL

## 2016-09-26 MED ORDER — HYDRALAZINE HCL 20 MG/ML IJ SOLN
INTRAMUSCULAR | Status: DC | PRN
  Administered 2016-09-26: 10 mg via INTRAVENOUS

## 2016-09-26 MED ORDER — HEPARIN (PORCINE) IN NACL 2-0.9 UNIT/ML-% IJ SOLN
INTRAMUSCULAR | Status: AC | PRN
  Administered 2016-09-26: 1000 mL

## 2016-09-26 SURGICAL SUPPLY — 13 items
CATH BALLN WEDGE 5F 110CM (CATHETERS) ×2 IMPLANT
CATH EXPO 5FR FR4 (CATHETERS) ×2 IMPLANT
CATH OPTITORQUE TIG 4.0 5F (CATHETERS) ×2 IMPLANT
DEVICE RAD COMP TR BAND LRG (VASCULAR PRODUCTS) ×2 IMPLANT
GLIDESHEATH SLEND A-KIT 6F 20G (SHEATH) ×2 IMPLANT
GUIDEWIRE .025 260CM (WIRE) ×2 IMPLANT
GUIDEWIRE INQWIRE 1.5J.035X260 (WIRE) ×1 IMPLANT
INQWIRE 1.5J .035X260CM (WIRE) ×2
KIT HEART LEFT (KITS) ×2 IMPLANT
PACK CARDIAC CATHETERIZATION (CUSTOM PROCEDURE TRAY) ×2 IMPLANT
SHEATH GLIDE SLENDER 4/5FR (SHEATH) ×2 IMPLANT
TRANSDUCER W/STOPCOCK (MISCELLANEOUS) ×2 IMPLANT
TUBING CIL FLEX 10 FLL-RA (TUBING) ×2 IMPLANT

## 2016-09-26 NOTE — Discharge Instructions (Signed)

## 2016-09-27 ENCOUNTER — Encounter (HOSPITAL_COMMUNITY): Payer: Self-pay | Admitting: Cardiology

## 2016-10-11 DIAGNOSIS — Z952 Presence of prosthetic heart valve: Secondary | ICD-10-CM

## 2016-10-11 HISTORY — DX: Presence of prosthetic heart valve: Z95.2

## 2016-10-19 ENCOUNTER — Ambulatory Visit (INDEPENDENT_AMBULATORY_CARE_PROVIDER_SITE_OTHER): Payer: Medicare Other | Admitting: Cardiovascular Disease

## 2016-10-19 ENCOUNTER — Other Ambulatory Visit: Payer: Self-pay

## 2016-10-19 ENCOUNTER — Encounter: Payer: Self-pay | Admitting: Cardiovascular Disease

## 2016-10-19 ENCOUNTER — Encounter (INDEPENDENT_AMBULATORY_CARE_PROVIDER_SITE_OTHER): Payer: Self-pay

## 2016-10-19 VITALS — BP 114/60 | HR 51 | Ht 64.5 in | Wt 129.4 lb

## 2016-10-19 DIAGNOSIS — I350 Nonrheumatic aortic (valve) stenosis: Secondary | ICD-10-CM

## 2016-10-19 NOTE — Patient Instructions (Signed)
Medication Instructions:  Your physician recommends that you continue on your current medications as directed. Please refer to the Current Medication list given to you today.   Labwork: none  Testing/Procedures: none  Follow-Up: We will make referral to Dr. Laneta SimmersBartle. We will contact you to arrange CT scans.   Any Other Special Instructions Will Be Listed Below (If Applicable).     If you need a refill on your cardiac medications before your next appointment, please call your pharmacy.
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
## 2016-11-01 ENCOUNTER — Ambulatory Visit (HOSPITAL_COMMUNITY): Payer: Medicare Other

## 2016-11-01 ENCOUNTER — Ambulatory Visit (HOSPITAL_COMMUNITY)
Admission: RE | Admit: 2016-11-01 | Discharge: 2016-11-01 | Disposition: A | Discharge status: Home / Self Care | Payer: Medicare Other | Source: Ambulatory Visit | Attending: Cardiovascular Disease | Admitting: Cardiovascular Disease

## 2016-11-01 ENCOUNTER — Ambulatory Visit (HOSPITAL_COMMUNITY): Admission: RE | Admit: 2016-11-01 | Payer: Medicare Other | Source: Ambulatory Visit

## 2016-11-01 ENCOUNTER — Encounter (HOSPITAL_COMMUNITY): Payer: Medicare Other

## 2016-11-01 DIAGNOSIS — J449 Chronic obstructive pulmonary disease, unspecified: Secondary | ICD-10-CM | POA: Insufficient documentation

## 2016-11-01 DIAGNOSIS — I350 Nonrheumatic aortic (valve) stenosis: Secondary | ICD-10-CM

## 2016-11-01 LAB — PULMONARY FUNCTION TEST
DL/VA % pred: 83 %
DL/VA: 4.02 ml/min/mmHg/L
DLCO unc % pred: 51 %
DLCO unc: 12.44 ml/min/mmHg
FEF 25-75 Post: 0.9 L/sec
FEF 25-75 Pre: 1.16 L/sec
FEF2575-%CHANGE-POST: -22 %
FEF2575-%Pred-Post: 63 %
FEF2575-%Pred-Pre: 81 %
FEV1-%Change-Post: -6 %
FEV1-%PRED-POST: 101 %
FEV1-%PRED-PRE: 108 %
FEV1-PRE: 1.79 L
FEV1-Post: 1.67 L
FEV1FVC-%Change-Post: 1 %
FEV1FVC-%Pred-Pre: 94 %
FEV6-%Change-Post: -6 %
FEV6-%PRED-PRE: 120 %
FEV6-%Pred-Post: 112 %
FEV6-POST: 2.29 L
FEV6-Pre: 2.45 L
FEV6FVC-%Change-Post: 1 %
FEV6FVC-%PRED-POST: 104 %
FEV6FVC-%PRED-PRE: 102 %
FVC-%CHANGE-POST: -8 %
FVC-%PRED-PRE: 116 %
FVC-%Pred-Post: 107 %
FVC-POST: 2.29 L
FVC-Pre: 2.49 L
POST FEV6/FVC RATIO: 100 %
PRE FEV1/FVC RATIO: 72 %
PRE FEV6/FVC RATIO: 98 %
Post FEV1/FVC ratio: 73 %
RV % PRED: 86 %
RV: 2.01 L
TLC % PRED: 78 %
TLC: 3.99 L

## 2016-11-01 MED ORDER — ALBUTEROL SULFATE (2.5 MG/3ML) 0.083% IN NEBU
2.5000 mg | INHALATION_SOLUTION | Freq: Once | RESPIRATORY_TRACT | Status: AC
  Administered 2016-11-01: 2.5 mg via RESPIRATORY_TRACT

## 2016-11-07 ENCOUNTER — Ambulatory Visit: Payer: Medicare Other | Admitting: Physical Therapy

## 2016-11-07 ENCOUNTER — Encounter: Payer: Medicare Other | Admitting: Surgery

## 2016-11-09 ENCOUNTER — Ambulatory Visit (HOSPITAL_COMMUNITY)
Admission: RE | Admit: 2016-11-09 | Discharge: 2016-11-09 | Disposition: A | Discharge status: Home / Self Care | Payer: Medicare Other | Source: Ambulatory Visit | Attending: Cardiovascular Disease | Admitting: Cardiovascular Disease

## 2016-11-09 ENCOUNTER — Ambulatory Visit (HOSPITAL_COMMUNITY): Payer: Medicare Other

## 2016-11-09 DIAGNOSIS — I350 Nonrheumatic aortic (valve) stenosis: Secondary | ICD-10-CM | POA: Diagnosis not present

## 2016-11-09 LAB — POCT I-STAT CREATININE: Creatinine, Ser: 1.3 mg/dL — ABNORMAL HIGH (ref 0.44–1.00)

## 2016-11-09 MED ORDER — IOPAMIDOL (ISOVUE-370) INJECTION 76%
INTRAVENOUS | Status: AC
  Administered 2016-11-09: 100 mL
  Filled 2016-11-09: qty 100

## 2016-11-14 ENCOUNTER — Institutional Professional Consult (permissible substitution) (INDEPENDENT_AMBULATORY_CARE_PROVIDER_SITE_OTHER): Payer: Medicare Other | Admitting: Surgery

## 2016-11-14 ENCOUNTER — Encounter: Payer: Self-pay | Admitting: Physical Therapy

## 2016-11-14 ENCOUNTER — Encounter: Payer: Self-pay | Admitting: Surgery

## 2016-11-14 ENCOUNTER — Ambulatory Visit: Payer: Medicare Other | Attending: Cardiovascular Disease | Admitting: Physical Therapy

## 2016-11-14 VITALS — BP 110/55 | HR 49 | Resp 16 | Ht 64.5 in | Wt 132.0 lb

## 2016-11-14 DIAGNOSIS — I350 Nonrheumatic aortic (valve) stenosis: Secondary | ICD-10-CM | POA: Diagnosis not present

## 2016-11-14 DIAGNOSIS — R2689 Other abnormalities of gait and mobility: Secondary | ICD-10-CM | POA: Diagnosis present

## 2016-11-14 DIAGNOSIS — T82857A Stenosis of cardiac prosthetic devices, implants and grafts, initial encounter: Secondary | ICD-10-CM

## 2016-11-14 DIAGNOSIS — R293 Abnormal posture: Secondary | ICD-10-CM | POA: Insufficient documentation

## 2016-11-14 NOTE — Progress Notes (Signed)
Patient ID: [name], female   DOB: Aug 28, 1940, 76 y.o.   MRN: [id number]  [facility]  MULTIDISCIPLINARY HEART VALVE CLINIC  CARDIOTHORACIC SURGERY CONSULTATION REPORT  Referring Provider is Yates Decamp, MD PCP is Ileana Ladd, MD  Chief Complaint  Patient presents with  . Aortic Stenosis    SEVERE BIOPROSTHETIC ...s/p AVR/MVR 2013..1st TAVR eval    HPI:  The patient is a 76 year old woman with a history of hypertension and congestive heart failure secondary to valvular heart disease who underwent AVR with a 21 mm Edwards Magna-Ease pericardial valve and mitral annuloplasty in 2013 by me. She presented at that time with progressive dyspnea and edema and an echo showed moderate AI with right ventricular dysfunction. Since that surgery she has done well with resolution of her dyspnea, fatigue and edema. She has remained active and has only been limited by her bilateral hip arthritis. She underwent right hip replacement on 06/15/2016 and her postop course was complicated by hypotension requiring dopamine. She was anemic and was transfused. She had an echo done on 06/17/2016 showing a normal LVEF of 60-65% with severe prosthetic aortic valve stenosis with a mean gradient of 41 mm Hg and a peak of 80 mm Hg. There was no significant MR or MS. She made a good recovery after her hip replacement. 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 She returned to see Dr. Jacinto Halim for cardiac evaluation and had a TEE on 08/22/2016 that showed a mean AV gradient of 33 mm Hg with a peak of 63 mm Hg. The valve leaflets were difficult to see but two of them appeared to be moving and one was fixed. The valve area was estimated at 0.6 to 0.7 cm2. The LVEF was 60-65% with no significant MR. She underwent cath on 09/26/2016 showing no coronary disease. She was seen by Dr. Excell Seltzer on 10/19/2016 for consideration of valve in valve TAVR. She says  that she has no shortness of breath or fatigue, no chest pain, no dizziness or syncope but is not very active at this time due to her hip arthritis.   She is still independent and living alone in her house. She does not drive but has a granddaughter that helps her get around. She is walking with a walker to prevent a fall with her bad left hip.  Past Medical History:  Diagnosis Date  . Aortic stenosis   . Arthritis   . Atrial fibrillation [facility])    Status post cardioversion November 2013  . CHF (congestive heart failure) (HCC)   . Dysrhythmia   . Hypertension   . Shortness of breath    hx dyspnea and respiratory abnormalities    Past Surgical History:  Procedure Laterality Date  . AORTIC VALVE REPLACEMENT  10/12/2011   Procedure: AORTIC VALVE REPLACEMENT (AVR);  Surgeon: Alleen Borne, MD;  Location: [facility] OR;  Service: Open Heart Surgery;  Laterality: N/A;  . CARDIAC CATHETERIZATION    . CARDIOVERSION  12/26/2011   Procedure: CARDIOVERSION;  Surgeon: Pamella Pert, MD;  Location: [facility] ENDOSCOPY;  Service: Cardiovascular;  Laterality: N/A;  . CORONARY ANGIOGRAPHY N/A 09/26/2016   Procedure: CORONARY ANGIOGRAPHY;  Surgeon: Elder Negus, MD;  Location: [facility] INVASIVE CV LAB;  Service: Cardiovascular;  Laterality: N/A;  . EXPLORATION POST OPERATIVE OPEN HEART  10/12/2011   Procedure: EXPLORATION POST OPERATIVE OPEN HEART;  Surgeon: Alleen Borne, MD;  Location: [facility] OR;  Service: Open Heart Surgery;  Laterality: N/A;  . LEFT AND RIGHT HEART CATHETERIZATION WITH CORONARY ANGIOGRAM N/A 09/11/2011   Procedure: LEFT AND RIGHT HEART CATHETERIZATION WITH CORONARY ANGIOGRAM;  Surgeon: Pamella Pert, MD;  Location: [facility] CATH LAB;  Service: Cardiovascular;  Laterality: N/A;  . MITRAL VALVE REPAIR  10/12/2011   Procedure: MITRAL VALVE REPAIR (MVR);  Surgeon: Alleen Borne, MD;  Location: [facility] OR;  Service: Open Heart Surgery;  Laterality: N/A;  mitral valve annuloplasty  . RIGHT HEART CATH N/A  09/26/2016   Procedure: RIGHT HEART CATH;  Surgeon: Elder Negus, MD;  Location: [facility] INVASIVE CV LAB;  Service: Cardiovascular;  Laterality: N/A;  . TEE WITHOUT CARDIOVERSION N/A 08/22/2016   Procedure: TRANSESOPHAGEAL ECHOCARDIOGRAM (TEE);  Surgeon: Yates Decamp, MD;  Location: [facility] ENDOSCOPY;  Service: Cardiovascular;  Laterality: N/A;  . TOTAL HIP ARTHROPLASTY Right 06/15/2016   Procedure: RIGHT TOTAL HIP ARTHROPLASTY ANTERIOR APPROACH;  Surgeon: Samson Frederic, MD;  Location: [facility] ORS;  Service: Orthopedics;  Laterality: Right;  Dr. needs RNFA    Family History  Problem Relation Age of Onset  . Hypertension Mother     Social History   Social History  . Marital status: Single    Spouse name: N/A  . Number of children: N/A  . Years of education: N/A   Occupational History  . Not on file.   Social History Main Topics  . Smoking status: Never Smoker  . Smokeless tobacco: Never Used  . Alcohol use No  . Drug use: No  . Sexual activity: Not on file   Other Topics Concern  . Not on file   Social History Narrative  . No narrative on file    Current Outpatient Prescriptions  Medication Sig Dispense Refill  . amLODipine (NORVASC) 10 MG tablet Take 10 mg by mouth daily.    Marland Kitchen aspirin 81 MG chewable tablet Chew 1 tablet (81 mg total) by mouth 2 (two) times daily. 60 tablet 1  . Multiple Vitamin (MULTIVITAMIN WITH MINERALS) TABS tablet Take 1 tablet by mouth daily.    . naproxen sodium (ANAPROX) 220 MG tablet Take 440 mg by mouth 2 (two) times daily as needed (for pain.).    Marland Kitchen spironolactone (ALDACTONE) 25 MG tablet Take 25 mg by mouth daily.    . traMADol (ULTRAM) 50 MG tablet Take 1 tablet by mouth every 6 (six) hours as needed. pain    . vitamin C (ASCORBIC ACID) 500 MG tablet Take 500 mg by mouth daily.     No current facility-administered medications for this visit.     No Known Allergies    Review of Systems:   General:  normal appetite, normal energy, no weight gain,  no weight loss, no fever  Cardiac:  no chest pain with exertion, no chest pain at rest, no SOB with  exertion, no resting SOB, no PND, no orthopnea, no palpitations, no arrhythmia, no atrial fibrillation, no LE edema, no dizzy spells, no syncope  Respiratory:  no shortness of breath, no home oxygen, no productive cough, no dry cough, no bronchitis, no wheezing, no hemoptysis, no asthma, no pain with inspiration or cough, no sleep apnea, no CPAP at night  GI:   no difficulty swallowing, no reflux, no frequent heartburn, no hiatal hernia, no abdominal pain, no constipation, no diarrhea, no hematochezia, no hematemesis, no melena  GU:   no dysuria,  no frequency, no urinary tract infection, no hematuria,  no kidney stones, no  kidney disease  Vascular:  no pain suggestive of claudication, no pain in feet, no leg cramps, no varicose veins, no DVT, no non-healing foot ulcer  Neuro:   no stroke, no TIA's, no seizures, no headaches, no temporary blindness one eye,  no slurred speech, no peripheral neuropathy, no chronic pain, has instability of gait, no memory/cognitive dysfunction  Musculoskeletal: has arthritis, no joint swelling, no myalgias, has difficulty walking, reduced mobility   Skin:   no rash, no itching, no skin infections, no pressure sores or ulcerations  Psych:   no anxiety, no depression, no nervousness, no unusual recent stress  Eyes:   no blurry vision, no floaters, no recent vision changes, no wears glasses or contacts  ENT:   no hearing loss, no loose or painful teeth, no dentures, last saw dentist this year  Hematologic:  no easy bruising, no abnormal bleeding, no clotting disorder, no frequent epistaxis  Endocrine:  no diabetes, does not check CBG's at home      Physical Exam:   BP (!) 110/55 (BP Location: Left Arm, Patient Position: Sitting, Cuff Size: Large)   Pulse (!) 49   Resp 16   Ht 5' 4.5" (1.638 m)   Wt 132 lb (59.9 kg)   SpO2 96% Comment: ON RA  BMI 22.31 kg/m    General:  Elderly but  well-appearing, no distress  HEENT:  Unremarkable, NCAT, PERLA, EOMI, oropharynx clear  Neck:   no JVD, no bruits, no adenopathy or thyromegaly  Chest:   clear to auscultation, symmetrical breath sounds, no wheezes, no rhonchi, old sternotomy scar.  CV:   RRR, grade III/VI crescendo/decrescendo murmur heard best at RSB,  no diastolic murmur  Abdomen:  soft, non-tender, no masses or organomeagly  Extremities:  warm, well-perfused, pulses palpable in feet, no LE edema  Rectal/GU  Deferred  Neuro:   Grossly non-focal and symmetrical throughout  Skin:   Clean and dry, no rashes, no breakdown   Diagnostic Tests:         *[place]*                   *[facility]*                         [address]                        [place], [place] [address]                            [phone]  ------------------------------------------------------------------- Transthoracic Echocardiography  Patient:    [name], [name] MR #:       [id number] Study Date: 06/17/2016 Gender:     F Age:        1 Height:     162.6 cm Weight:     62.3 kg BSA:        1.68 m^2 Pt. Status: Room:       1231   REFERRING    Yates Decamp, MD  SONOGRAPHER  [facility]  ORDERING     Pamella Pert  PERFORMING   [place], Inpatient  ADMITTING    Samson Frederic [id number]  ATTENDING    Samson Frederic [id number]  cc:  ------------------------------------------------------------------- LV EF: 60% -   65%  ------------------------------------------------------------------- Indications:      Abnormal EKG 794.31.  ------------------------------------------------------------------- History:   PMH:  aortic valve replacement and mitral valve ring repair on 10/12/2011. Bradycardia.  Congestive heart failure.  Risk factors:  Hypertension.  ------------------------------------------------------------------- Study Conclusions  - Left ventricle: The cavity size was  normal. Wall thickness was   increased in a pattern of mild LVH. Systolic function was normal.   The estimated ejection fraction was in the range of 60% to 65%.   Wall motion was normal; there were no regional wall motion   abnormalities. - Aortic valve: There was a bioprosthetic aortic valve. The valve   was poorly visualized. There was turbulence across the valve and   severely elevated mean gradient. Mean gradient (S): 41 mm Hg.   Peak gradient (S): 80 mm Hg. - Mitral valve: Status post mitral valve repair. No significant   stenosis or regurgitation. Mean gradient (D): 4 mm Hg. Valve area   by pressure half-time: 2.65 cm^2. - Right ventricle: The cavity size was normal. Systolic function   was mildly reduced. - Tricuspid valve: Peak RV-RA gradient (S): 44 mm Hg. - Pulmonary arteries: PA peak pressure: 47 mm Hg (S). - Inferior vena cava: The vessel was normal in size. The   respirophasic diameter changes were in the normal range (>= 50%),   consistent with normal central venous pressure.  Impressions:  - Normal LV size and systolic function with EF 60-65%. Normal RV   size with mildly decreased systolic function. S/p mitral valve   repair, no significant stenosis or regurgitation. 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 Would consider TEE, ?pannus versus thrombus. Mild   pulmonary hypertension.  ------------------------------------------------------------------- Study data:  Comparison was made to the study of 09/04/2011.  Study status:  Routine.  Procedure:  Transthoracic echocardiography. Image quality was adequate.          Transthoracic echocardiography.  M-mode, complete 2D, spectral Doppler, and color Doppler.  Birthdate:  Patient birthdate: [birth year].  Age:  Patient is 76 yr old.  Sex:  Gender: female.    BMI: 23.6 kg/m^2.  Blood pressure:     152/90  Patient status:  Inpatient.  Study date: Study date: 06/17/2016.  Study time: 03:36 PM.  Location:  Bedside.   -------------------------------------------------------------------  ------------------------------------------------------------------- Left ventricle:  The cavity size was normal. Wall thickness was increased in a pattern of mild LVH. Systolic function was normal. The estimated ejection fraction was in the range of 60% to 65%. Wall motion was normal; there were no regional wall motion abnormalities.  ------------------------------------------------------------------- Aortic valve:  There was a bioprosthetic aortic valve. The valve was poorly visualized. There was turbulence across the valve and severely elevated mean gradient.  Doppler:     VTI ratio of LVOT to aortic valve: 0.27. Peak velocity ratio of LVOT to aortic valve: 0.23. Mean velocity ratio of LVOT to aortic valve: 0.27.    Mean gradient (S): 41 mm Hg. Peak gradient (S): 80 mm Hg.  ------------------------------------------------------------------- Aorta:  Aortic root: The aortic root was normal in size. Ascending aorta: The ascending aorta was normal in size.  ------------------------------------------------------------------- Mitral valve:  Status post mitral valve repair. No significant stenosis or regurgitation.  Doppler:     Valve area by pressure half-time: 2.65 cm^2. Indexed valve area by pressure half-time: 1.57 cm^2/m^2.    Mean gradient (D): 4 mm Hg. Peak gradient (D): 7 mm Hg.  ------------------------------------------------------------------- Left atrium:  The atrium was normal in size.  ------------------------------------------------------------------- Right ventricle:  The cavity size was normal. Systolic function was  mildly reduced.  ------------------------------------------------------------------- Pulmonic valve:    Structurally normal valve.   Cusp separation was normal.  Doppler:  Transvalvular velocity was within the normal range. There was  trivial regurgitation.  ------------------------------------------------------------------- Tricuspid valve:   Doppler:  There was mild regurgitation.  ------------------------------------------------------------------- Right atrium:  The atrium was normal in size.  ------------------------------------------------------------------- Pericardium:  There was no pericardial effusion.  ------------------------------------------------------------------- Systemic veins: Inferior vena cava: The vessel was normal in size. The respirophasic diameter changes were in the normal range (>= 50%), consistent with normal central venous pressure.  ------------------------------------------------------------------- Measurements   Left ventricle                         Value          Reference  LV ID, ED, PLAX chordal        (L)     37.4  mm       43 - 52  LV ID, ES, PLAX chordal        (L)     20.6  mm       23 - 38  LV fx shortening, PLAX chordal         45    %        >=29  LV PW thickness, ED                    10.3  mm       ----------  IVS/LV PW ratio, ED                    0.86           <=1.3  LV e&', lateral                         6     cm/s     ----------  LV E/e&', lateral                       22.67          ----------  LV e&', medial                          3.57  cm/s     ----------  LV E/e&', medial                        38.1           ----------  LV e&', average                         4.79  cm/s     ----------  LV E/e&', average                       28.42          ----------    Ventricular septum                     Value          Reference  IVS thickness, ED                      8.83  mm       ----------    LVOT  Value          Reference  LVOT peak velocity, S                  99.4  cm/s     ----------  LVOT mean velocity, S                  75.1  cm/s     ----------  LVOT VTI, S                            22    cm       ----------     Aortic valve                           Value          Reference  Aortic valve peak velocity, S          434   cm/s     ----------  Aortic valve mean velocity, S          282   cm/s     ----------  Aortic valve VTI, S                    82.5  cm       ----------  Aortic mean gradient, S                39    mm Hg    ----------  Aortic peak gradient, S                75    mm Hg    ----------  VTI ratio, LVOT/AV                     0.27           ----------  Velocity ratio, peak, LVOT/AV          0.23           ----------  Velocity ratio, mean, LVOT/AV          0.27           ----------    Aorta                                  Value          Reference  Aortic root ID, ED                     31    mm       ----------    Left atrium                            Value          Reference  LA ID, A-P, ES                         38    mm       ----------  LA ID/bsa, A-P                 (H)     2.26  cm/m^2   <=2.2  LA volume, S  34.4  ml       ----------  LA volume/bsa, S                       20.4  ml/m^2   ----------  LA volume, ES, 1-p A4C                 28.9  ml       ----------  LA volume/bsa, ES, 1-p A4C             17.2  ml/m^2   ----------  LA volume, ES, 1-p A2C                 39.7  ml       ----------  LA volume/bsa, ES, 1-p A2C             23.6  ml/m^2   ----------    Mitral valve                           Value          Reference  Mitral E-wave peak velocity            136   cm/s     ----------  Mitral A-wave peak velocity            117   cm/s     ----------  Mitral mean velocity, D                91.5  cm/s     ----------  Mitral deceleration time       (H)     254   ms       150 - 230  Mitral pressure half-time              83    ms       ----------  Mitral mean gradient, D                4     mm Hg    ----------  Mitral peak gradient, D                7     mm Hg    ----------  Mitral E/A ratio, peak                 1.2            ----------  Mitral  valve area, PHT, DP             2.65  cm^2     ----------  Mitral valve area/bsa, PHT, DP         1.57  cm^2/m^2 ----------  Mitral annulus VTI, D                  40.3  cm       ----------    Pulmonary arteries                     Value          Reference  PA pressure, S, DP             (H)     47    mm Hg    <=30    Tricuspid valve  Value          Reference  Tricuspid regurg peak velocity         331   cm/s     ----------  Tricuspid peak RV-RA gradient          44    mm Hg    ----------    Right atrium                           Value          Reference  RA ID, S-I, ES, A4C            (H)     51    mm       34 - 49  RA area, ES, A4C                       13.5  cm^2     8.3 - 19.5  RA volume, ES, A/L                     30.3  ml       ----------  RA volume/bsa, ES, A/L                 18    ml/m^2   ----------    Systemic veins                         Value          Reference  Estimated CVP                          3     mm Hg    ----------    Right ventricle                        Value          Reference  TAPSE                                  16.1  mm       ----------  Legend: (L)  and  (H)  mark values outside specified reference range.  ------------------------------------------------------------------- Prepared and Electronically Authenticated by  Marca Ancona, M.D. 2018-04-28T16:51:26         *[place]*                   *[facility]*                         [address]                        [place], [place] [address]                            [phone]  ------------------------------------------------------------------- Transesophageal Echocardiography  (Report amended )  Patient:    [name], [name] MR #:       [id number] Study Date: 08/22/2016 Gender:     F Age:        70 Height:     162.6 cm Weight:     62.3 kg BSA:  1.68 m^2 Pt. Status: Room:   ATTENDING    Yates Decamp, MD  PERFORMING   Yates Decamp, MD  REFERRING    Yates Decamp, MD  ADMITTING    Mickeal Skinner R  SONOGRAPHER  Sheralyn Boatman  cc:  ------------------------------------------------------------------- LV EF: 60% -   65%  ------------------------------------------------------------------- Indications:      Aortic stenosis 424.1.  ------------------------------------------------------------------- History:   PMH:  Bradycardia. Heart block.  Dyspnea.  Mitral valve disease.  Risk factors:  Hypertension.  ------------------------------------------------------------------- Study Conclusions  - Left ventricle: There was mild concentric hypertrophy. Systolic   function was normal. The estimated ejection fraction was in the   range of 60% to 65%. - Aortic valve: Very difficult to visualize the leaflets. Severe   bioprosthetic aortic valve stenosis. Peak gradient 63, mean   gradient 33 mmHg, calculated aortic valve area 0.64 cm^2 and 0.70   cm^2 by V-max. Mildly calcified annulus. A bioprosthesis was   present and functioning abnormally. 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 Valve mobility was severely restricted. Valve area   (VTI): 0.58 cm^2. Valve area (Vmax): 0.7 cm^2. Valve area   (Vmean): 0.6 cm^2. - Mitral valve: An annular ring prosthesis was present and   functioning normally. The prosthesis had a normal range of   motion. - Left atrium: The atrium was dilated. No evidence of thrombus in   the atrial cavity or appendage. - Right atrium: No evidence of thrombus in the atrial cavity or   appendage. - Impressions: The right ventricular systolic pressure was   increased consistent with moderate pulmonary hypertension. PASP   45-50 mm Hg.  Impressions:  - The right ventricular systolic pressure was increased consistent   with moderate pulmonary hypertension. PASP 45-50 mm Hg.  ------------------------------------------------------------------- Study data:   Study  status:  Routine.  Consent:  The risks, benefits, and alternatives to the procedure were explained to the patient and informed consent was obtained.  Procedure:  Initial setup. The patient was brought to the laboratory. Surface ECG leads were monitored. Sedation. Conscious sedation was administered by cardiology staff. Transesophageal echocardiography. An adult multiplane transesophageal probe was inserted by the attending cardiologistwithout difficulty. Image quality was adequate.  Study completion:  The patient tolerated the procedure well. There were no complications.  Administered medications:   Fentanyl, 12.35mcg, IV.  Midazolam, , IV.          Diagnostic transesophageal echocardiography.  2D and color Doppler.  Birthdate:  Patient birthdate: March 18, 1940.  Age:  Patient is 76 yr old.  Sex:  Gender: female.    BMI: 23.6 kg/m^2.  Blood pressure:     158/116  Patient status:  Inpatient.  Study date:  Study date: 08/22/2016. Study time: 11:44 AM.  Location:  Endoscopy.  -------------------------------------------------------------------  ------------------------------------------------------------------- Left ventricle:  There was mild concentric hypertrophy. Systolic function was normal. The estimated ejection fraction was in the range of 60% to 65%.  ------------------------------------------------------------------- Aortic valve:  Very difficult to visualize the leaflets. Severe bioprosthetic aortic valve stenosis. Peak gradient 63, mean gradient 33 mmHg, calculated aortic valve area 0.64 cm^2 and 0.70 cm^2 by V-max.  Mildly calcified annulus. A bioprosthesis was present and functioning abnormally. There was severe pannus formation. Valve mobility was severely restricted.  Doppler: There was severe stenosis.      VTI ratio of LVOT to aortic valve: 0.2. Valve area (VTI): 0.58 cm^2. Indexed valve area (VTI): 0.34 cm^2/m^2. Peak velocity ratio of LVOT  to aortic valve: 0.25.  Valve area (Vmax): 0.7 cm^2. Indexed valve area (Vmax): 0.42 cm^2/m^2. Mean velocity ratio of LVOT to aortic valve: 0.21. Valve area (Vmean): 0.6 cm^2. Indexed valve area (Vmean): 0.36 cm^2/m^2. Mean gradient (S): 30 mm Hg. Peak gradient (S): 63 mm Hg.  ------------------------------------------------------------------- Aorta:  The aorta was normal, not dilated, and non-diseased.  ------------------------------------------------------------------- Mitral valve:  An annular ring prosthesis was present and functioning normally. The prosthesis had a normal range of motion. Mobility was not restricted.  Doppler:  Transvalvular velocity was within the normal range. There was no significant regurgitation.   ------------------------------------------------------------------- Left atrium:  The atrium was dilated.  No evidence of thrombus in the atrial cavity or appendage. The appendage was morphologically a left appendage. Emptying velocity was normal.  ------------------------------------------------------------------- Atrial septum:  Well visualized.  Doppler showed no shunt.  ------------------------------------------------------------------- Right ventricle:  The cavity size was normal. Wall thickness was normal. Systolic function was normal.  ------------------------------------------------------------------- Pulmonic valve:    Structurally normal valve.   Cusp separation was normal.  No evidence of vegetation.  Doppler:  There was trivial regurgitation.  ------------------------------------------------------------------- Tricuspid valve:   Structurally normal valve.   Leaflet separation was normal.  Doppler:  There was mild regurgitation.  ------------------------------------------------------------------- Right atrium:  The atrium was normal in size.  No evidence of thrombus in the atrial cavity or  appendage.  ------------------------------------------------------------------- Pericardium:  The pericardium was normal in appearance. There was no pericardial effusion.  ------------------------------------------------------------------- Post procedure conclusions Ascending Aorta:  - The aorta was normal, not dilated, and non-diseased.  ------------------------------------------------------------------- Measurements   Left ventricle                           Value  Stroke volume, 2D                        50    ml  Stroke volume/bsa, 2D                    30    ml/m^2    LVOT                                     Value  LVOT ID, S                               19    mm  LVOT area                                2.84  cm^2  LVOT peak velocity, S                    98.2  cm/s  LVOT mean velocity, S                    52.5  cm/s  LVOT VTI, S                              17.6  cm  LVOT peak gradient, S                    4     mm Hg  Aortic valve                             Value  Aortic valve peak velocity, S            398   cm/s  Aortic valve mean velocity, S            249   cm/s  Aortic valve VTI, S                      86.7  cm  Aortic mean gradient, S                  30    mm Hg  Aortic peak gradient, S                  63    mm Hg  VTI ratio, LVOT/AV                       0.2  Aortic valve area, VTI                   0.58  cm^2  Aortic valve area/bsa, VTI               0.34  cm^2/m^2  Velocity ratio, peak, LVOT/AV            0.25  Aortic valve area, peak velocity         0.7   cm^2  Aortic valve area/bsa, peak velocity     0.42  cm^2/m^2  Velocity ratio, mean, LVOT/AV            0.21  Aortic valve area, mean velocity         0.6   cm^2  Aortic valve area/bsa, mean velocity     0.36  cm^2/m^2  Legend: (L)  and  (H)  mark values outside specified reference range.  ------------------------------------------------------------------- Francena Hanly,  MD 2018-07-07T10:13:25   Procedures   CORONARY ANGIOGRAPHY  RIGHT HEART CATH  Conclusion   Angiographically no significant coronary artery disease Normal filling pressures  Procedural Details/Technique   Technical Details Right heart catheterization and selective right and left coronary angiography was performed with continuous hemodynamic monitoring  Indication: Preop evaluation for noncardiac surgery, severe aortic stenosis.  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 Given her severe aortic stenosis, she was referred for coronary angiography and right heart catheterization as pre-op evaluation.  Light sedation given using 0.5 mg Versed.  Technique: Under sterile conditions 22-gauge right before meals venous access was exchanged for a 5 French venous sheath. Right radial access for his obtained. Cocktail of 1.5 mg verapamil and 100 g of nitroglycerin glycerin was occlusive.  Right heart catheterization was performed using a balloon tipped 5 French wedge catheter. We used a Glidewire to assist advancing the catheter. Before stopping the pulmonary artery pressures for by pulmonary capillary wedge pressures and pulmonary artery sat duration. In the obtained right ventricular and right atrial pressures.  Left heart catheterization was performed using 5 French TIG 4.0 catheter for left coronary artery and a 5 [place] JL4 catheter for right coronary artery. Coronary angiogram demonstrated normal coronary artery vessels with  no angiographically significant coronary artery disease.   Estimated blood loss <50 mL.  During this procedure the patient was administered the following to achieve and maintain moderate conscious  sedation: Versed 0.5 mg, while the patient's heart rate, blood pressure, and oxygen saturation were continuously monitored. The period of conscious sedation was 44 minutes, of which I was present face-to-face 100% of this time.    Complications   Complications documented before study signed (09/27/2016 6:13 AM EDT)    RIGHT/LEFT HEART CATH AND CORONARY ANGIOGRAPHY   None Documented by Elder Negus, MD 09/26/2016 5:50 PM EDT  Time Range: Intra-procedure      Coronary Findings   Dominance: Right  Left Main  Vessel was injected. Vessel is normal in caliber. Vessel is angiographically normal.  Left Anterior Descending  Vessel was injected. Vessel is normal in caliber. Vessel is angiographically normal.  Ramus Intermedius  Vessel was injected. Vessel is normal in caliber. Vessel is angiographically normal.  Left Circumflex  Vessel was injected. Vessel is normal in caliber. Vessel is angiographically normal.  Right Coronary Artery  Vessel was injected. Vessel is normal in caliber. Vessel is angiographically normal.  Right Heart   Right Heart Pressures RA mean pressure 1 mmHg, A wave 6 mmHg, V wave 3 mmHg RV pressure 43/1 mmHg with RVEDP 3 mmHg PA pressure 37/9 mmHg with mean pressure 19 mmHg PCWP 9 mmHg, A wave 10 mmHg, V wave 14 mmHg    Coronary Diagrams   Diagnostic Diagram       Implants     No implant documentation for this case.  PACS Images   Show images for Cardiac catheterization   Link to Procedure Log   Procedure Log    Hemo Data    Most Recent Value  Fick Cardiac Output 3.99 L/min  Fick Cardiac Output Index 2.42 (L/min)/BSA  RA A Wave 6 mmHg  RA V Wave 3 mmHg  RA Mean 1 mmHg  RV Systolic Pressure 43 mmHg  RV Diastolic Pressure -1 mmHg  RV EDP 3 mmHg  PA Systolic Pressure 37 mmHg  PA Diastolic Pressure 9 mmHg  PA Mean 19 mmHg  PW A Wave 10 mmHg  PW V Wave 14 mmHg  PW Mean 9 mmHg  AO Systolic Pressure 172 mmHg  AO Diastolic Pressure 78 mmHg    AO Mean 111 mmHg  QP/QS 1  TPVR Index 7.86 HRUI  TSVR Index 45.92 HRUI  PVR SVR Ratio 0.09  TPVR/TSVR Ratio 0.17   CT CORONARY MORPH W/CTA COR W/SCORE W/CA W/CM &/OR WO/CM (Accession [id number]) (Order [id number])  Imaging  Date: 11/09/2016 Department: [facility] CT IMAGING Released By: Venita Lick Authorizing: Tonny Bollman, MD  Exam Information   Status Exam Begun  Exam Ended   Final [99] 11/09/2016 3:24 PM 11/09/2016 3:56 PM  PACS Images   Show images for CT CORONARY MORPH W/CTA COR W/SCORE W/CA W/CM &/OR WO/CM  Addendum   ADDENDUM REPORT: 11/13/2016 10:34  EXAM: OVER-READ INTERPRETATION  CT CHEST  The following report is an over-read performed by radiologist Dr. Royal Piedra [facility] on 11/13/2016. This over-read does not include interpretation of cardiac or coronary anatomy or pathology. The cardiac CTA interpretation by the cardiologist is attached.  COMPARISON:  None.  FINDINGS: Extracardiac findings will be described under separate dictation for contemporaneously obtained CTA of the chest, abdomen and pelvis.  IMPRESSION: Please see separate dictation for contemporaneously obtained CTA of the chest, abdomen and pelvis dated 11/09/2016  for full description of relevant extracardiac findings.   Electronically Signed   By: Trudie Reed M.D.   On: 11/13/2016 10:34   Addended by Florencia Reasons, MD on 11/13/2016 10:36 AM    Study Result   CLINICAL DATA:  Aortic Stenosis  EXAM: Cardiac TAVR CT  TECHNIQUE: The patient was scanned on a Siemens Force 192 slice scanner. A 120 kV retrospective scan was triggered in the ascending thoracic aorta at 140 HU's. Gantry rotation speed was 250 msecs and collimation was .6 mm. No beta blockade or nitro were given. The 3D data set was reconstructed in 5% intervals of the R-R cycle. Systolic and diastolic phases were analyzed on a dedicated work station  using MPR, MIP and VRT modes. The patient received 80 cc of contrast.  FINDINGS: Aortic Valve: The is a 21 mm Edwards stented bovine pericardial Magna Ease bioprosthetic valve There is no stent creep with post to post diameter about 18 mm. The valve appears small compared to the coronary sinuses The leaflets only thicken near the base. There is no appreciable calcification. There does appear to be commissural fusion near the base especially between the right leaflet. The base of the leaflets are thickened and hypodense but this more likely represents artifact from the stents as opposed to subclinical thrombus. There is no appreciable pannus seen.  Aorta:  27.5 mm  Sino-tubular Junction:  26 mm  Ascending Thoracic Aorta:  27.5 mm  Aortic Arch:  25 mm  Descending Thoracic Aorta:  Tortuous 23 mm  Sinus of Valsalva Measurements:  Non-coronary:  30.9 mm  Right - coronary:  29.8 mm  Left -   coronary:  31.5 mm  Coronary Artery Height above Annulus:  Left Main:  11.3 mm above annulus  Right Coronary:  16 mm above annulus  Virtual Basal Annulus Measurements:  Maximum / Minimum Diameter: Inner diameter of bioprosthetic valve 21 mm  Perimeter:  68.9 mm  Area:  377 mm2  IMPRESSION: 1) 21 mm Magna Ease bioprosthetic valve. Planimetered area 1.5 cm2. Some thickening of the basal leaflets and commissural fusion but no obvious subclinical thrombus or pannus  2) Suitable coronary height for valve in valve procedure  3) Normal Internal Diameter ID 21 mm for this valve  4) Normal aortic root 27.5 mm  5) Relatively large coronary sinuses for implanted valve  Charlton Haws  Electronically Signed: By: Charlton Haws M.D. On: 11/09/2016 18:37       CT Angio Abd/Pel w/ and/or w/o (Accession [id number]) (Order [id number])  Imaging  Date: 11/09/2016 Department: [facility] CT IMAGING Released By: Fortino Sic Authorizing:  Tonny Bollman, MD  Exam Information   Status Exam Begun  Exam Ended   Final [99] 11/09/2016 3:24 PM 11/09/2016 3:56 PM  PACS Images   Show images for CT Angio Abd/Pel w/ and/or w/o  Study Result   CLINICAL DATA:  76 year old female with history of severe aortic stenosis. Preprocedural study prior to potential transcatheter aortic valve replacement (TAVR) procedure.  EXAM: CT ANGIOGRAPHY CHEST, ABDOMEN AND PELVIS  TECHNIQUE: Multidetector CT imaging through the chest, abdomen and pelvis was performed using the standard protocol during bolus administration of intravenous contrast. Multiplanar reconstructed images and MIPs were obtained and reviewed to evaluate the vascular anatomy.  CONTRAST:  100 mL of Isovue 370.  COMPARISON:  CT of the abdomen and pelvis 12/31/2002.  FINDINGS: CTA CHEST FINDINGS  Cardiovascular: Heart size is enlarged. There is no significant pericardial fluid, thickening or  pericardial calcification. Aortic atherosclerosis (mild), without evidence of aneurysm or dissection. Status post median sternotomy for aortic valve replacement with a stented bioprosthesis. Status post mitral annuloplasty.  Mediastinum/Lymph Nodes: No pathologically enlarged mediastinal or hilar lymph nodes. Esophagus is unremarkable in appearance. No axillary lymphadenopathy.  Lungs/Pleura: No suspicious appearing pulmonary nodules or masses. No acute consolidative airspace disease. No pleural effusions.  Musculoskeletal/Soft Tissues: Median sternotomy wires. There are no aggressive appearing lytic or blastic lesions noted in the visualized portions of the skeleton.  CTA ABDOMEN AND PELVIS FINDINGS  Hepatobiliary: 12 mm low-attenuation lesion between segments 6 and 7 in the liver is only slightly larger than remote prior study from 2004, most compatible with a small cyst. No suspicious hepatic lesions. No intra or extrahepatic biliary ductal  dilatation. Gallbladder is normal in appearance.  Pancreas: No pancreatic mass. No pancreatic ductal dilatation. No pancreatic or peripancreatic fluid or inflammatory changes.  Spleen: Unremarkable.  Adrenals/Urinary Tract: Multiple well-defined low-attenuation lesions in both kidneys, compatible with simple cysts, largest of which measures up to 2.7 cm in the upper pole the left kidney. In addition, there are several subcentimeter low-attenuation lesions in both kidneys, too small to definitively characterize, but favored to represent tiny cysts. Bilateral adrenal glands are normal in appearance. No hydroureteronephrosis. Urinary bladder is partially obscured by beam hardening artifact from the patient's right hip arthroplasty, but is unremarkable in appearance.  Stomach/Bowel: Normal appearance of the stomach. No pathologic dilatation of small bowel or colon. The appendix is not confidently identified and may be surgically absent. Regardless, there are no inflammatory changes noted adjacent to the cecum to suggest the presence of an acute appendicitis at this time.  Vascular/Lymphatic: Aortic atherosclerosis, without evidence of aneurysm or dissection in the abdominal or pelvic vasculature. Vascular findings and measurements pertinent to potential TAVR procedure, as detailed below. Celiac axis, superior mesenteric artery and inferior mesenteric artery are all widely patent without hemodynamically significant stenosis. Single renal artery is bilaterally appear widely patent. No lymphadenopathy noted in the abdomen or pelvis.  Reproductive: Coarse calcification in the uterine fundus, presumably a calcified fibroid. Ovaries are atrophic.  Other: No significant volume of ascites.  No pneumoperitoneum.  Musculoskeletal: Status post right hip arthroplasty. Advanced joint space narrowing, subchondral sclerosis, subchondral cyst formation and osteophyte formation in the left  hip joint, compatible with severe osteoarthritis. Left-sided protrusio acetabulae. There are no aggressive appearing lytic or blastic lesions noted in the visualized portions of the skeleton.  VASCULAR MEASUREMENTS PERTINENT TO TAVR:  AORTA:  Minimal Aortic Diameter -  20 x 18 mm  Severity of Aortic Calcification -  mild  RIGHT PELVIS:  Right Common Iliac Artery -  Minimal Diameter - 12.2 x 11.9 mm  Tortuosity - moderate  Calcification - mild  Right External Iliac Artery -  Minimal Diameter - 8.2 x 7.8 mm  Tortuosity - moderate  Calcification - none  Right Common Femoral Artery -  Minimal Diameter - 7.8 x 7.8 mm  Tortuosity - mild  Calcification - none  LEFT PELVIS:  Left Common Iliac Artery -  Minimal Diameter - 12.8 x 11.6 mm  Tortuosity - moderate  Calcification - mild  Left External Iliac Artery -  Minimal Diameter - 9.0 x 8.0 mm  Tortuosity - moderate to severe  Calcification - none  Left Common Femoral Artery -  Minimal Diameter - 7.3 x 8.4 mm  Tortuosity - mild  Calcification - none  Review of the MIP images confirms the above findings.  IMPRESSION:  1. Vascular findings and measurements pertinent to potential TAVR procedure, as detailed above. This patient does have suitable pelvic arterial access bilaterally. 2. Status post aortic valve replacement with a stented aortic bioprosthesis. 3. Cardiomegaly. 4. Aortic atherosclerosis. 5. Additional incidental findings, as above. Aortic Atherosclerosis (ICD10-I70.0).   Electronically Signed   By: Trudie Reed M.D.   On: 11/13/2016 12:12     Impression:  This 76 year old woman has stage C1, severe, asymptomatic bioprosthetic aortic valve stenosis. She has severe hip DJD and is not very active at this time which may be preventing symptoms. She requires a left hip replacement. She did have a right hip replacement in April and had periop  hypotension which could have been caused by her AS and anemia. I have personally reviewed her echo, cath and CTA studies. Her prosthetic valve appears to have one leaflet that is not moving and the mean gradient is 41 mm Hg by 2D echo and 33 mm Hg by TEE. This is clearly higher than it should be and in the severe stenosis range. If she did not require further surgery it would be acceptable to continue observation since she is asymptomatic but since she does require more surgery it is probably best to address this problem. It will most likely get worse with time and her lifespan may be fairly long since she has no coronary disease and her mother lived to be 105. I think valve in valve TAVR is probably the best option at this time and would allow her to more safely undergo left hip replacement. She has a 21 mm Magna Ease valve that is suitable for TAVR using a 23 mm Medtronic Evolut valve. Her abdominal and pelvic CT shows arterial anatomy suitable for a transfemoral insertion.   The patient was counseled at length regarding treatment alternatives for management of severe prosthetic aortic valve stenosis. The risks and benefits of surgical intervention hav been discussed in detail. Long-term prognosis with medical therapy was discussed. Alternative approaches such as conventional surgical aortic valve replacement, transcatheter aortic valve replacement, and palliative medical therapy were compared and contrasted at length. This discussion was placed in the context of the patient's own specific clinical presentation and past medical history. All of their questions been addressed.    Following the decision to proceed with transcatheter aortic valve replacement, a discussion was held regarding what types of management strategies would be attempted intraoperatively in the event of life-threatening complications, including whether or not the patient would be considered a candidate for the use of cardiopulmonary bypass  and/or conversion to open sternotomy for attempted surgical intervention.   The patient has been advised of a variety of complications that might develop including but not limited to risks of death, stroke, paravalvular leak, aortic dissection or other major vascular complications, aortic annulus rupture, device embolization, cardiac rupture or perforation, mitral regurgitation, acute myocardial infarction, arrhythmia, heart block or bradycardia requiring permanent pacemaker placement, congestive heart failure, respiratory failure, renal failure, pneumonia, infection, other late complications related to structural valve deterioration or migration, or other complications that might ultimately cause a temporary or permanent loss of functional independence or other long term morbidity. The patient provides full informed consent for the procedure as described and all questions were answered.     Plan:  The patient will return to see Dr. Cornelius Moras for a second surgical evaluation and if her agrees we will schedule for valve in valve TAVR using a transfemoral Medtronic Evolut valve.   I spent  60 minutes performing this consultation and > 50% of this time was spent face to face counseling and coordinating the care of this patient's severe prosthetic aortic valve stenosis.    Alleen Borne, MD 11/14/2016

## 2016-11-14 NOTE — Therapy (Signed)
[facility] [address] [place], [place], [address] Phone: [phone]   Fax:  [phone]  Physical Therapy Evaluation  Patient Details  Name: [name] MRN: [id number] Date of Birth: 1940-11-13 Referring Provider: Tonny Bollman  Encounter Date: 11/14/2016      [name] End of Session - 11/14/16 1343    Visit Number 1   [name] Start Time 1340   [name] Stop Time 1420   [name] Time Calculation (min) 40 min      Past Medical History:  Diagnosis Date  . Aortic stenosis   . Arthritis   . Atrial fibrillation [facility])    Status post cardioversion November 2013  . CHF (congestive heart failure) (HCC)   . Dysrhythmia   . Hypertension   . Shortness of breath    hx dyspnea and respiratory abnormalities    Past Surgical History:  Procedure Laterality Date  . AORTIC VALVE REPLACEMENT  10/12/2011   Procedure: AORTIC VALVE REPLACEMENT (AVR);  Surgeon: Alleen Borne, MD;  Location: [facility] OR;  Service: Open Heart Surgery;  Laterality: N/A;  . CARDIAC CATHETERIZATION    . CARDIOVERSION  12/26/2011   Procedure: CARDIOVERSION;  Surgeon: Pamella Pert, MD;  Location: [facility] ENDOSCOPY;  Service: Cardiovascular;  Laterality: N/A;  . CORONARY ANGIOGRAPHY N/A 09/26/2016   Procedure: CORONARY ANGIOGRAPHY;  Surgeon: Elder Negus, MD;  Location: [facility] INVASIVE CV LAB;  Service: Cardiovascular;  Laterality: N/A;  . EXPLORATION POST OPERATIVE OPEN HEART  10/12/2011   Procedure: EXPLORATION POST OPERATIVE OPEN HEART;  Surgeon: Alleen Borne, MD;  Location: [facility] OR;  Service: Open Heart Surgery;  Laterality: N/A;  . LEFT AND RIGHT HEART CATHETERIZATION WITH CORONARY ANGIOGRAM N/A 09/11/2011   Procedure: LEFT AND RIGHT HEART CATHETERIZATION WITH CORONARY ANGIOGRAM;  Surgeon: Pamella Pert, MD;  Location: [facility] CATH LAB;  Service: Cardiovascular;  Laterality: N/A;  . MITRAL VALVE REPAIR  10/12/2011   Procedure: MITRAL VALVE REPAIR (MVR);  Surgeon: Alleen Borne,  MD;  Location: [facility] OR;  Service: Open Heart Surgery;  Laterality: N/A;  mitral valve annuloplasty  . RIGHT HEART CATH N/A 09/26/2016   Procedure: RIGHT HEART CATH;  Surgeon: Elder Negus, MD;  Location: [facility] INVASIVE CV LAB;  Service: Cardiovascular;  Laterality: N/A;  . TEE WITHOUT CARDIOVERSION N/A 08/22/2016   Procedure: TRANSESOPHAGEAL ECHOCARDIOGRAM (TEE);  Surgeon: Yates Decamp, MD;  Location: [facility] ENDOSCOPY;  Service: Cardiovascular;  Laterality: N/A;  . TOTAL HIP ARTHROPLASTY Right 06/15/2016   Procedure: RIGHT TOTAL HIP ARTHROPLASTY ANTERIOR APPROACH;  Surgeon: Samson Frederic, MD;  Location: [facility] ORS;  Service: Orthopedics;  Laterality: Right;  Dr. needs RNFA    There were no vitals filed for this visit.       Subjective Assessment - 11/14/16 1346    Subjective [name] has valve replacement in 2013 with good results. She underwent a R THA in April 2018 and is now in need of  L THA. With pre-op testing, they have discovered a concern for risk of cardiac complication due to aortic valve. [name] is denying cardiac symptoms at this time and is anxious to address L hip arthritis.    Patient Stated Goals get L THA   Currently in Pain? No/denies            [facility] [name] Assessment - 11/14/16 0001      Assessment   Medical Diagnosis severe aortic stenosis   Referring Provider Tonny Bollman   Onset Date/Surgical Date 10/19/16  Precautions   Precautions Other (comment)   Precaution Comments no lifting greater than 10 lbs     Restrictions   Weight Bearing Restrictions No     Balance Screen   Has the patient fallen in the past 6 months No   Has the patient had a decrease in activity level because of a fear of falling?  No   Is the patient reluctant to leave their home because of a fear of falling?  No     Home Environment   Living Environment Private residence   Living Arrangements Alone   Type of Home --  townhome   Home Access Stairs to enter   Entrance Stairs-Number of Steps 2    Entrance Stairs-Rails None  can use walker   Home Layout One level     Prior Function   Level of Independence Independent with community mobility with device  RW     Posture/Postural Control   Posture/Postural Control Postural limitations   Postural Limitations Rounded Shoulders;Forward head  mild to moderate     ROM / Strength   AROM / PROM / Strength AROM;Strength     AROM   Overall AROM Comments grossly WNL     Strength   Overall Strength Comments grossly WNL throughout but painful with L hip flexion   Strength Assessment Site Hand   Right/Left hand Right;Left   Right Hand Grip (lbs) 55  R hand dominant   Left Hand Grip (lbs) 56     Ambulation/Gait   Gait Comments [name] ambulates with forward flexed posture and crouched gait with increased knee flexion and limited foot clearance bil. [name] has genu valgus bil and decr. step length. [name]'s gait distance is limited by 76% for age/gender using a 2 wheeled RW.           [facility] Pre-Surgical Assessment - 11/14/16 0001    5 Meter Walk Test- trial 1 15 sec   5 Meter Walk Test- trial 2 14 sec.    5 Meter Walk Test- trial 3 16 sec.   5 meter walk test average 15 sec   4 Stage Balance Test tolerated for:  10 sec.   4 Stage Balance Test Position 2   Comment unable without UE suport   ADL/IADL Independent with: Bathing;Dressing;Meal prep;Finances   ADL/IADL Needs Assistance with: Pincus Badder work   ADL/IADL Fraility Index Vulnerable   6 Minute Walk- Baseline yes   BP (mmHg) 122/55   HR (bpm) (!)  45   02 Sat (%RA) 98 %   Modified Borg Scale for Dyspnea 0- Nothing at all   Perceived Rate of Exertion (Borg) 6-   6 Minute Walk Post Test yes   BP (mmHg) 151/66   HR (bpm) 70   02 Sat (%RA) 97 %   Modified Borg Scale for Dyspnea 0- Nothing at all   Perceived Rate of Exertion (Borg) 9- very light   Aerobic Endurance Distance Walked 370          Objective measurements completed on examination: See above findings.                   [name] Education - 11/14/16 1406    Education provided Yes   Education Details fall risk, continued use of RW   Person(s) Educated Patient   Methods Explanation   Comprehension Verbalized understanding                     Plan - 11/14/16 1407  Clinical Impression Statement see below   [name] Frequency One time visit     Clinical Impression Statement: [name] is a 76 yo female presenting to OP [name] for evaluation prior to possible TAVR surgery due to severe aortic stenosis. [name] underwent valve replacement in 2013 with great improvement of cardiac symptoms. [name] had a R THA in April 2018 and is now in need of a L THA due to severe arthritis. Pre-surgical work-up revealed concerns for cardiac complication and patient is now being considered for TAVR. [name] does not report shortness of breath or other symptoms at this time and is primarily limited by L hip. [name] presents with good ROM and strength, poor balance and is at high fall risk 4 stage balance test, slow walking speed which contributed to her limited distance with 6 minute walk test. Based on the Short Physical Performance Battery, patient has a frailty rating of 2/12 with </= 5/12 considered frail.   Patient demonstrated the following deficits and impairments:     Visit Diagnosis: Other abnormalities of gait and mobility  Abnormal posture      G-Codes - 28-Nov-2016 1536    Functional Assessment Tool Used (Outpatient Only) 6 minute walk 370'   Functional Limitation Mobility: Walking and moving around   Mobility: Walking and Moving Around Current Status [phone]) At least 60 percent but less than 80 percent impaired, limited or restricted   Mobility: Walking and Moving Around Goal Status [phone]) At least 60 percent but less than 80 percent impaired, limited or restricted   Mobility: Walking and Moving Around Discharge Status [phone]) At least 60 percent but less than 80 percent impaired, limited or restricted        Problem List Patient Active Problem List   Diagnosis Date Noted  . 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 Aortic regurgitation 10/02/2011  . Anasarca 08/21/2011  . Hypokalemia 08/21/2011  . Diastolic CHF, acute on chronic (HCC) 08/18/2011  . SOB (shortness of breath) 08/18/2011  . Pleural effusion, bilateral 08/18/2011  . Hypoxia 08/18/2011  . Hypertension     [name],[name], [name] 11/28/16, 3:36 PM  [facility] [address] [place], [place], [address] Phone: [phone]   Fax:  [phone]  Name: [name] MRN: [id number] Date of Birth: [birth year]

## 2016-11-15 ENCOUNTER — Other Ambulatory Visit: Payer: Self-pay

## 2016-11-15 DIAGNOSIS — I350 Nonrheumatic aortic (valve) stenosis: Secondary | ICD-10-CM

## 2016-11-21 ENCOUNTER — Institutional Professional Consult (permissible substitution) (INDEPENDENT_AMBULATORY_CARE_PROVIDER_SITE_OTHER): Payer: Medicare Other | Admitting: Thoracic Surgery (Cardiothoracic Vascular Surgery)

## 2016-11-21 ENCOUNTER — Encounter: Payer: Self-pay | Admitting: Thoracic Surgery (Cardiothoracic Vascular Surgery)

## 2016-11-21 VITALS — BP 129/69 | HR 45 | Resp 20 | Ht 64.5 in | Wt 129.6 lb

## 2016-11-21 DIAGNOSIS — I350 Nonrheumatic aortic (valve) stenosis: Secondary | ICD-10-CM | POA: Diagnosis not present

## 2016-11-21 DIAGNOSIS — T82857A Stenosis of cardiac prosthetic devices, implants and grafts, initial encounter: Secondary | ICD-10-CM

## 2016-11-21 NOTE — Patient Instructions (Signed)
Continue all previous medications without any changes at this time  

## 2016-11-21 NOTE — Progress Notes (Signed)
[facility]  MULTIDISCIPLINARY HEART VALVE CLINIC  CARDIOTHORACIC SURGERY CONSULTATION REPORT  Referring Provider is Yates Decamp, MD PCP is Ileana Ladd, MD  Chief Complaint  Patient presents with  . Aortic Stenosis    2nd TAVR eval, review all studies, surgery scheduled for 11/28/16    HPI:  Patient is a 76 year old African-American female with history of aortic insufficiency, mitral regurgitation, hypertension, and chronic diastolic congestive heart failure who underwent aortic valve replacement using a 21 mm Edwards Magna Ease bovine pericardial tissue valve and mitral valve ring annuloplasty by Dr. Laneta Simmers in 2013.  Her immediate postoperative recovery was notable for mediastinal bleeding requiring reexploration. She subsequently developed atrial fibrillation requiring DC cardioversion. Subsequent follow-up echocardiograms performed at Dr.  Verl Dicker office reportedly demonstrated normalization of left ventricular systolic function with moderately elevated transvalvular gradient across the aortic valve. She was also reported to have chronic paroxysmal complete heart block and junctional rhythm without associated symptoms of symptomatic bradycardia.  She otherwise did well from a cardiac standpoint until April of this year when she underwent elective right total hip replacement for severe degenerative arthritis.  Immediately following surgery she was noted to have paroxysmal junctional escape rhythm and marked sinus bradycardia with first-degree AV block associated with hypotension in the setting of severe acute blood loss anemia with hemoglobin 7.5. She was transfused 2 units of packed red blood cells and blood pressure was supported using dopamine. Transthoracic echocardiogram performed at that time revealed normal left ventricular systolic function with ejection fraction estimated 60-65%. Transvalvular gradient across the aortic valve was elevated with peak velocity reported 4.3  m/s corresponding to mean transvalvular gradient estimated 39 mmHg. She recovered fairly quickly and was discharged home without event.  She has done well from a cardiac standpoint with no further episodes of hypotension, chest discomfort, or shortness of breath. Transesophageal echocardiogram was performed 08/22/2016 and felt to demonstrate the presence of "severe pannus formation". The mean transvalvular gradient was measured 33 mmHg and aortic valve area calculated 0.64 cm, although by planimetry the valve area measured greater than 1.8 cm. The mitral valve was functioning normally with no residual mitral regurgitation.  Diagnostic cardiac catheterization was performed 09/26/2016 and notable for the absence of significant coronary artery disease. Transvalvular gradient across the aortic valve was not assessed. Pulmonary artery pressures were minimally elevated.  The patient was referred to the multidisciplinary heart valve clinic and evaluated by Dr. 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 CT angiography was performed to further characterize the feasibility of transcatheter aortic valve replacement, but cardiac gated CT angiogram of the heart did not reveal findings consistent with significant pannus formation or leaflet restriction. Aortic valve area by planimetry measured greater than 1.5 cm. The patient was referred for surgical consultation and evaluated by Dr. Laneta Simmers, and a second surgical consultation has been requested.  The patient is single and lives locally in [place]. She has 2 adult sons who live in [place], 2 sisters who live nearby, and several grandchildren.  The patient states that she has remained fairly active physically and is entirely functionally independent until approximately one year ago when she developed fairly rapid onset of  severe pain in both hips related to degenerative arthritis. Symptoms persisted and progressed until it became very difficult for her to remain ambulatory. After right total hip replacement performed 6 months ago the patient has done  fairly well, although she remains severely limited by pain in her left hip. She ambulates using a walker. She specifically denies any symptoms of exertional shortness of breath or chest discomfort, although she admits that her physical mobility remains limited. She has never had any resting shortness of breath, chest tightness, PND, orthopnea, or lower extremity edema. She denies any dizzy spells, syncope, or palpitations. She states that since her blood pressure medications were changed immediately following her hip replacement last spring she has actually felt quite well. She hopes to get her left hip replaced in the near future.  Past Medical History:  Diagnosis Date  . Aortic stenosis   . Arthritis   . Atrial fibrillation [facility])    Status post cardioversion November 2013  . CHF (congestive heart failure) (HCC)   . Dysrhythmia   . Hypertension   . Shortness of breath    hx dyspnea and respiratory abnormalities    Past Surgical History:  Procedure Laterality Date  . AORTIC VALVE REPLACEMENT  10/12/2011   Procedure: AORTIC VALVE REPLACEMENT (AVR);  Surgeon: Alleen Borne, MD;  Location: [facility] OR;  Service: Open Heart Surgery;  Laterality: N/A;  . CARDIAC CATHETERIZATION    . CARDIOVERSION  12/26/2011   Procedure: CARDIOVERSION;  Surgeon: Pamella Pert, MD;  Location: [facility] ENDOSCOPY;  Service: Cardiovascular;  Laterality: N/A;  . CORONARY ANGIOGRAPHY N/A 09/26/2016   Procedure: CORONARY ANGIOGRAPHY;  Surgeon: Elder Negus, MD;  Location: [facility] INVASIVE CV LAB;  Service: Cardiovascular;  Laterality: N/A;  . EXPLORATION POST OPERATIVE OPEN HEART  10/12/2011   Procedure: EXPLORATION POST OPERATIVE OPEN HEART;  Surgeon: Alleen Borne, MD;  Location: [facility] OR;  Service:  Open Heart Surgery;  Laterality: N/A;  . LEFT AND RIGHT HEART CATHETERIZATION WITH CORONARY ANGIOGRAM N/A 09/11/2011   Procedure: LEFT AND RIGHT HEART CATHETERIZATION WITH CORONARY ANGIOGRAM;  Surgeon: Pamella Pert, MD;  Location: [facility] CATH LAB;  Service: Cardiovascular;  Laterality: N/A;  . MITRAL VALVE REPAIR  10/12/2011   Procedure: MITRAL VALVE REPAIR (MVR);  Surgeon: Alleen Borne, MD;  Location: [facility] OR;  Service: Open Heart Surgery;  Laterality: N/A;  mitral valve annuloplasty  . RIGHT HEART CATH N/A 09/26/2016   Procedure: RIGHT HEART CATH;  Surgeon: Elder Negus, MD;  Location: [facility] INVASIVE CV LAB;  Service: Cardiovascular;  Laterality: N/A;  . TEE WITHOUT CARDIOVERSION N/A 08/22/2016   Procedure: TRANSESOPHAGEAL ECHOCARDIOGRAM (TEE);  Surgeon: Yates Decamp, MD;  Location: [facility] ENDOSCOPY;  Service: Cardiovascular;  Laterality: N/A;  . TOTAL HIP ARTHROPLASTY Right 06/15/2016   Procedure: RIGHT TOTAL HIP ARTHROPLASTY ANTERIOR APPROACH;  Surgeon: Samson Frederic, MD;  Location: [facility] ORS;  Service: Orthopedics;  Laterality: Right;  Dr. needs RNFA    Family History  Problem Relation Age of Onset  . Hypertension Mother     Social History   Social History  . Marital status: Single    Spouse name: N/A  . Number of children: N/A  . Years of education: N/A   Occupational History  . Not on file.   Social History Main Topics  . Smoking status: Never Smoker  . Smokeless tobacco: Never Used  . Alcohol use No  . Drug use: No  . Sexual activity: Not on file   Other Topics Concern  . Not on file   Social History Narrative  . No narrative on file    Current Outpatient Prescriptions  Medication Sig Dispense Refill  . amLODipine (NORVASC) 10 MG tablet Take 10 mg  by mouth daily.    Marland Kitchen aspirin 81 MG chewable tablet Chew 1 tablet (81 mg total) by mouth 2 (two) times daily. 60 tablet 1  . Multiple Vitamin (MULTIVITAMIN WITH MINERALS) TABS tablet Take 1 tablet by mouth daily.    . naproxen  sodium (ANAPROX) 220 MG tablet Take 440 mg by mouth 2 (two) times daily as needed (for pain.).    Marland Kitchen spironolactone (ALDACTONE) 25 MG tablet Take 25 mg by mouth daily.    . traMADol (ULTRAM) 50 MG tablet Take 1 tablet by mouth every 6 (six) hours as needed. pain    . vitamin C (ASCORBIC ACID) 500 MG tablet Take 500 mg by mouth daily.     No current facility-administered medications for this visit.     No Known Allergies    Review of Systems:   General:  normal appetite, normal energy, no weight gain, no weight loss, no fever  Cardiac:  no chest pain with exertion, no chest pain at rest, no SOB with exertion, no resting SOB, no PND, no orthopnea, no palpitations, no arrhythmia, no atrial fibrillation, no LE edema, no dizzy spells, no syncope  Respiratory:  no shortness of breath, no home oxygen, no productive cough, no dry cough, no bronchitis, no wheezing, no hemoptysis, no asthma, no pain with inspiration or cough, no sleep apnea, no CPAP at night  GI:   no difficulty swallowing, no reflux, no frequent heartburn, no hiatal hernia, no abdominal pain, no constipation, no diarrhea, no hematochezia, no hematemesis, no melena  GU:   no dysuria,  no frequency, no urinary tract infection, no hematuria, no kidney stones, no kidney disease  Vascular:  no pain suggestive of claudication, no pain in feet, no leg cramps, no varicose veins, no DVT, no non-healing foot ulcer  Neuro:   no stroke, no TIA's, no seizures, no headaches, no temporary blindness one eye,  no slurred speech, no peripheral neuropathy, + chronic pain, + instability of gait, no memory/cognitive dysfunction  Musculoskeletal: + arthritis, no joint swelling, no myalgias, + difficulty walking, limited mobility   Skin:   no rash, no itching, no skin infections, no pressure sores or ulcerations  Psych:   no anxiety, no depression, no nervousness, no unusual recent stress  Eyes:   no blurry vision, no floaters, no recent vision changes, +  wears glasses or contacts  ENT:   no hearing loss, no loose or painful teeth, no dentures, last saw dentist within the past year  Hematologic:  no easy bruising, no abnormal bleeding, no clotting disorder, no frequent epistaxis  Endocrine:  no diabetes, does not check CBG's at home           Physical Exam:   BP 129/69   Pulse (!) 45   Resp 20   Ht 5' 4.5" (1.638 m)   Wt 129 lb 9.6 oz (58.8 kg)   SpO2 97% Comment: RA  BMI 21.90 kg/m   General:  Elderly but well-appearing  HEENT:  Unremarkable   Neck:   no JVD, no bruits, no adenopathy   Chest:   clear to auscultation, symmetrical breath sounds, no wheezes, no rhonchi   CV:   RRR, grade III/VI crescendo/decrescendo murmur heard best at RUSB,  no diastolic murmur  Abdomen:  soft, non-tender, no masses   Extremities:  warm, well-perfused, pulses palpable, no LE edema  Rectal/GU  Deferred  Neuro:   Grossly non-focal and symmetrical throughout  Skin:   Clean and dry, no rashes, no  breakdown   Diagnostic Tests:  Transthoracic Echocardiography  Patient:    Kahleah, Crass MR #:       [id number] Study Date: 06/17/2016 Gender:     F Age:        19 Height:     162.6 cm Weight:     62.3 kg BSA:        1.68 m^2 Pt. Status: Room:       1231   REFERRING    Yates Decamp, MD  SONOGRAPHER  [facility]  ORDERING     Pamella Pert  PERFORMING   Alexander, Inpatient  ADMITTING    Samson Frederic [id number]  ATTENDING    Samson Frederic [id number]  cc:  ------------------------------------------------------------------- LV EF: 60% -   65%  ------------------------------------------------------------------- Indications:      Abnormal EKG 794.31.  ------------------------------------------------------------------- History:   PMH:  aortic valve replacement and mitral valve ring repair on 10/12/2011. Bradycardia.  Congestive heart failure.  Risk factors:   Hypertension.  ------------------------------------------------------------------- Study Conclusions  - Left ventricle: The cavity size was normal. Wall thickness was   increased in a pattern of mild LVH. Systolic function was normal.   The estimated ejection fraction was in the range of 60% to 65%.   Wall motion was normal; there were no regional wall motion   abnormalities. - Aortic valve: There was a bioprosthetic aortic valve. The valve   was poorly visualized. There was turbulence across the valve and   severely elevated mean gradient. Mean gradient (S): 41 mm Hg.   Peak gradient (S): 80 mm Hg. - Mitral valve: Status post mitral valve repair. No significant   stenosis or regurgitation. Mean gradient (D): 4 mm Hg. Valve area   by pressure half-time: 2.65 cm^2. - Right ventricle: The cavity size was normal. Systolic function   was mildly reduced. - Tricuspid valve: Peak RV-RA gradient (S): 44 mm Hg. - Pulmonary arteries: PA peak pressure: 47 mm Hg (S). - Inferior vena cava: The vessel was normal in size. The   respirophasic diameter changes were in the normal range (>= 50%),   consistent with normal central venous pressure.  Impressions:  - Normal LV size and systolic function with EF 60-65%. Normal RV   size with mildly decreased systolic function. S/p mitral valve   repair, no significant stenosis or regurgitation. 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 Would consider TEE, ?pannus versus thrombus. Mild   pulmonary hypertension.  ------------------------------------------------------------------- Study data:  Comparison was made to the study of 09/04/2011.  Study status:  Routine.  Procedure:  Transthoracic echocardiography. Image quality was adequate.          Transthoracic echocardiography.  M-mode, complete 2D, spectral Doppler, and color Doppler.  Birthdate:  Patient birthdate: 10/06/1940.  Age:  Patient is 76 yr  old.  Sex:  Gender: female.    BMI: 23.6 kg/m^2.  Blood pressure:     152/90  Patient status:  Inpatient.  Study date: Study date: 06/17/2016. Study time: 03:36 PM.  Location:  Bedside.   -------------------------------------------------------------------  ------------------------------------------------------------------- Left ventricle:  The cavity size was normal. Wall thickness was increased in a pattern of mild LVH. Systolic function was normal. The estimated ejection fraction was in the range of 60% to 65%. Wall motion was normal; there were no regional wall motion abnormalities.  ------------------------------------------------------------------- Aortic valve:  There was a bioprosthetic aortic valve. The valve was poorly visualized. There was turbulence across  the valve and severely elevated mean gradient.  Doppler:     VTI ratio of LVOT to aortic valve: 0.27. Peak velocity ratio of LVOT to aortic valve: 0.23. Mean velocity ratio of LVOT to aortic valve: 0.27.    Mean gradient (S): 41 mm Hg. Peak gradient (S): 80 mm Hg.  ------------------------------------------------------------------- Aorta:  Aortic root: The aortic root was normal in size. Ascending aorta: The ascending aorta was normal in size.  ------------------------------------------------------------------- Mitral valve:  Status post mitral valve repair. No significant stenosis or regurgitation.  Doppler:     Valve area by pressure half-time: 2.65 cm^2. Indexed valve area by pressure half-time: 1.57 cm^2/m^2.    Mean gradient (D): 4 mm Hg. Peak gradient (D): 7 mm Hg.  ------------------------------------------------------------------- Left atrium:  The atrium was normal in size.  ------------------------------------------------------------------- Right ventricle:  The cavity size was normal. Systolic function was mildly  reduced.  ------------------------------------------------------------------- Pulmonic valve:    Structurally normal valve.   Cusp separation was normal.  Doppler:  Transvalvular velocity was within the normal range. There was trivial regurgitation.  ------------------------------------------------------------------- Tricuspid valve:   Doppler:  There was mild regurgitation.  ------------------------------------------------------------------- Right atrium:  The atrium was normal in size.  ------------------------------------------------------------------- Pericardium:  There was no pericardial effusion.  ------------------------------------------------------------------- Systemic veins: Inferior vena cava: The vessel was normal in size. The respirophasic diameter changes were in the normal range (>= 50%), consistent with normal central venous pressure.  ------------------------------------------------------------------- Measurements   Left ventricle                         Value          Reference  LV ID, ED, PLAX chordal        (L)     37.4  mm       43 - 52  LV ID, ES, PLAX chordal        (L)     20.6  mm       23 - 38  LV fx shortening, PLAX chordal         45    %        >=29  LV PW thickness, ED                    10.3  mm       ----------  IVS/LV PW ratio, ED                    0.86           <=1.3  LV e&', lateral                         6     cm/s     ----------  LV E/e&', lateral                       22.67          ----------  LV e&', medial                          3.57  cm/s     ----------  LV E/e&', medial                        38.1           ----------  LV e&',  average                         4.79  cm/s     ----------  LV E/e&', average                       28.42          ----------    Ventricular septum                     Value          Reference  IVS thickness, ED                      8.83  mm       ----------    LVOT                                    Value          Reference  LVOT peak velocity, S                  99.4  cm/s     ----------  LVOT mean velocity, S                  75.1  cm/s     ----------  LVOT VTI, S                            22    cm       ----------    Aortic valve                           Value          Reference  Aortic valve peak velocity, S          434   cm/s     ----------  Aortic valve mean velocity, S          282   cm/s     ----------  Aortic valve VTI, S                    82.5  cm       ----------  Aortic mean gradient, S                39    mm Hg    ----------  Aortic peak gradient, S                75    mm Hg    ----------  VTI ratio, LVOT/AV                     0.27           ----------  Velocity ratio, peak, LVOT/AV          0.23           ----------  Velocity ratio, mean, LVOT/AV          0.27           ----------    Aorta  Value          Reference  Aortic root ID, ED                     31    mm       ----------    Left atrium                            Value          Reference  LA ID, A-P, ES                         38    mm       ----------  LA ID/bsa, A-P                 (H)     2.26  cm/m^2   <=2.2  LA volume, S                           34.4  ml       ----------  LA volume/bsa, S                       20.4  ml/m^2   ----------  LA volume, ES, 1-p A4C                 28.9  ml       ----------  LA volume/bsa, ES, 1-p A4C             17.2  ml/m^2   ----------  LA volume, ES, 1-p A2C                 39.7  ml       ----------  LA volume/bsa, ES, 1-p A2C             23.6  ml/m^2   ----------    Mitral valve                           Value          Reference  Mitral E-wave peak velocity            136   cm/s     ----------  Mitral A-wave peak velocity            117   cm/s     ----------  Mitral mean velocity, D                91.5  cm/s     ----------  Mitral deceleration time       (H)     254   ms       150 - 230  Mitral pressure half-time              83     ms       ----------  Mitral mean gradient, D                4     mm Hg    ----------  Mitral peak gradient, D                7     mm Hg    ----------  Mitral E/A ratio, peak  1.2            ----------  Mitral valve area, PHT, DP             2.65  cm^2     ----------  Mitral valve area/bsa, PHT, DP         1.57  cm^2/m^2 ----------  Mitral annulus VTI, D                  40.3  cm       ----------    Pulmonary arteries                     Value          Reference  PA pressure, S, DP             (H)     47    mm Hg    <=30    Tricuspid valve                        Value          Reference  Tricuspid regurg peak velocity         331   cm/s     ----------  Tricuspid peak RV-RA gradient          44    mm Hg    ----------    Right atrium                           Value          Reference  RA ID, S-I, ES, A4C            (H)     51    mm       34 - 49  RA area, ES, A4C                       13.5  cm^2     8.3 - 19.5  RA volume, ES, A/L                     30.3  ml       ----------  RA volume/bsa, ES, A/L                 18    ml/m^2   ----------    Systemic veins                         Value          Reference  Estimated CVP                          3     mm Hg    ----------    Right ventricle                        Value          Reference  TAPSE                                  16.1  mm       ----------  Legend: (L)  and  (H)  mark values outside specified reference range.  -------------------------------------------------------------------  Prepared and Electronically Authenticated by  Marca Ancona, M.D. 2018-04-28T16:51:26   Transesophageal Echocardiography  (Report amended )  Patient:    Marsi, Turvey MR #:       [id number] Study Date: 08/22/2016 Gender:     F Age:        78 Height:     162.6 cm Weight:     62.3 kg BSA:        1.68 m^2 Pt. Status: Room:   ATTENDING    Yates Decamp, MD  PERFORMING   Yates Decamp, MD  REFERRING    Yates Decamp, MD   ADMITTING    Mickeal Skinner R  SONOGRAPHER  Sheralyn Boatman  cc:  ------------------------------------------------------------------- LV EF: 60% -   65%  ------------------------------------------------------------------- Indications:      Aortic stenosis 424.1.  ------------------------------------------------------------------- History:   PMH:  Bradycardia. Heart block.  Dyspnea.  Mitral valve disease.  Risk factors:  Hypertension.  ------------------------------------------------------------------- Study Conclusions  - Left ventricle: There was mild concentric hypertrophy. Systolic   function was normal. The estimated ejection fraction was in the   range of 60% to 65%. - Aortic valve: Very difficult to visualize the leaflets. Severe   bioprosthetic aortic valve stenosis. Peak gradient 63, mean   gradient 33 mmHg, calculated aortic valve area 0.64 cm^2 and 0.70   cm^2 by V-max. Mildly calcified annulus. A bioprosthesis was   present and functioning abnormally. 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 Valve mobility was severely restricted. Valve area   (VTI): 0.58 cm^2. Valve area (Vmax): 0.7 cm^2. Valve area   (Vmean): 0.6 cm^2. - Mitral valve: An annular ring prosthesis was present and   functioning normally. The prosthesis had a normal range of   motion. - Left atrium: The atrium was dilated. No evidence of thrombus in   the atrial cavity or appendage. - Right atrium: No evidence of thrombus in the atrial cavity or   appendage. - Impressions: The right ventricular systolic pressure was   increased consistent with moderate pulmonary hypertension. PASP   45-50 mm Hg.  Impressions:  - The right ventricular systolic pressure was increased consistent   with moderate pulmonary hypertension. PASP 45-50 mm Hg.  ------------------------------------------------------------------- Study data:   Study status:  Routine.  Consent:  The  risks, benefits, and alternatives to the procedure were explained to the patient and informed consent was obtained.  Procedure:  Initial setup. The patient was brought to the laboratory. Surface ECG leads were monitored. Sedation. Conscious sedation was administered by cardiology staff. Transesophageal echocardiography. An adult multiplane transesophageal probe was inserted by the attending cardiologistwithout difficulty. Image quality was adequate.  Study completion:  The patient tolerated the procedure well. There were no complications.  Administered medications:   Fentanyl, 12.69mcg, IV.  Midazolam, 3mg , IV.          Diagnostic transesophageal echocardiography.  2D and color Doppler.  Birthdate:  Patient birthdate: 24-Nov-1940.  Age:  Patient is 76 yr old.  Sex:  Gender: female.    BMI: 23.6 kg/m^2.  Blood pressure:     158/116  Patient status:  Inpatient.  Study date:  Study date: 08/22/2016. Study time: 11:44 AM.  Location:  Endoscopy.  -------------------------------------------------------------------  ------------------------------------------------------------------- Left ventricle:  There was mild concentric hypertrophy. Systolic function was normal. The estimated ejection fraction was in the range of 60% to 65%.  ------------------------------------------------------------------- Aortic valve:  Very difficult to visualize the leaflets. Severe bioprosthetic aortic valve  stenosis. Peak gradient 63, mean gradient 33 mmHg, calculated aortic valve area 0.64 cm^2 and 0.70 cm^2 by V-max.  Mildly calcified annulus. A bioprosthesis was present and functioning abnormally. There was severe pannus formation. Valve mobility was severely restricted.  Doppler: There was severe stenosis.      VTI ratio of LVOT to aortic valve: 0.2. Valve area (VTI): 0.58 cm^2. Indexed valve area (VTI): 0.34 cm^2/m^2. Peak velocity ratio of LVOT to aortic valve: 0.25. Valve area (Vmax): 0.7 cm^2. Indexed  valve area (Vmax): 0.42 cm^2/m^2. Mean velocity ratio of LVOT to aortic valve: 0.21. Valve area (Vmean): 0.6 cm^2. Indexed valve area (Vmean): 0.36 cm^2/m^2. Mean gradient (S): 30 mm Hg. Peak gradient (S): 63 mm Hg.  ------------------------------------------------------------------- Aorta:  The aorta was normal, not dilated, and non-diseased.  ------------------------------------------------------------------- Mitral valve:  An annular ring prosthesis was present and functioning normally. The prosthesis had a normal range of motion. Mobility was not restricted.  Doppler:  Transvalvular velocity was within the normal range. There was no significant regurgitation.   ------------------------------------------------------------------- Left atrium:  The atrium was dilated.  No evidence of thrombus in the atrial cavity or appendage. The appendage was morphologically a left appendage. Emptying velocity was normal.  ------------------------------------------------------------------- Atrial septum:  Well visualized.  Doppler showed no shunt.  ------------------------------------------------------------------- Right ventricle:  The cavity size was normal. Wall thickness was normal. Systolic function was normal.  ------------------------------------------------------------------- Pulmonic valve:    Structurally normal valve.   Cusp separation was normal.  No evidence of vegetation.  Doppler:  There was trivial regurgitation.  ------------------------------------------------------------------- Tricuspid valve:   Structurally normal valve.   Leaflet separation was normal.  Doppler:  There was mild regurgitation.  ------------------------------------------------------------------- Right atrium:  The atrium was normal in size.  No evidence of thrombus in the atrial cavity or appendage.  ------------------------------------------------------------------- Pericardium:  The pericardium  was normal in appearance. There was no pericardial effusion.  ------------------------------------------------------------------- Post procedure conclusions Ascending Aorta:  - The aorta was normal, not dilated, and non-diseased.  ------------------------------------------------------------------- Measurements   Left ventricle                           Value  Stroke volume, 2D                        50    ml  Stroke volume/bsa, 2D                    30    ml/m^2    LVOT                                     Value  LVOT ID, S                               19    mm  LVOT area                                2.84  cm^2  LVOT peak velocity, S                    98.2  cm/s  LVOT mean velocity, S  52.5  cm/s  LVOT VTI, S                              17.6  cm  LVOT peak gradient, S                    4     mm Hg    Aortic valve                             Value  Aortic valve peak velocity, S            398   cm/s  Aortic valve mean velocity, S            249   cm/s  Aortic valve VTI, S                      86.7  cm  Aortic mean gradient, S                  30    mm Hg  Aortic peak gradient, S                  63    mm Hg  VTI ratio, LVOT/AV                       0.2  Aortic valve area, VTI                   0.58  cm^2  Aortic valve area/bsa, VTI               0.34  cm^2/m^2  Velocity ratio, peak, LVOT/AV            0.25  Aortic valve area, peak velocity         0.7   cm^2  Aortic valve area/bsa, peak velocity     0.42  cm^2/m^2  Velocity ratio, mean, LVOT/AV            0.21  Aortic valve area, mean velocity         0.6   cm^2  Aortic valve area/bsa, mean velocity     0.36  cm^2/m^2  Legend: (L)  and  (H)  mark values outside specified reference range.  ------------------------------------------------------------------- Francena Hanly, MD [id number]:13:25  CORONARY ANGIOGRAPHY  RIGHT HEART CATH  Conclusion   Angiographically no significant  coronary artery disease Normal filling pressures  Procedural Details/Technique   Technical Details Right heart catheterization and selective right and left coronary angiography was performed with continuous hemodynamic monitoring  Indication: Preop evaluation for noncardiac surgery, severe aortic stenosis.  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 Given her severe aortic stenosis, she was referred for coronary angiography and right heart catheterization as pre-op evaluation.  Light sedation given using 0.5 mg Versed.  Technique: Under sterile conditions 22-gauge right before meals venous access was exchanged for a 5 French venous sheath. Right radial access for his obtained. Cocktail of 1.5 mg verapamil and 100 g of nitroglycerin glycerin was occlusive.  Right heart catheterization was performed using a balloon tipped 5 French wedge catheter.  We used a Glidewire to assist advancing the catheter. Before stopping the pulmonary artery pressures for by pulmonary capillary wedge pressures and pulmonary artery sat duration. In the obtained right ventricular and right atrial pressures.  Left heart catheterization was performed using 5 French TIG 4.0 catheter for left coronary artery and a 5 [place] JL4 catheter for right coronary artery. Coronary angiogram demonstrated normal coronary artery vessels with no angiographically significant coronary artery disease.   Estimated blood loss <50 mL.  During this procedure the patient was administered the following to achieve and maintain moderate conscious sedation: Versed 0.5 mg, while the patient's heart rate, blood pressure, and oxygen saturation were continuously monitored. The period of  conscious sedation was 44 minutes, of which I was present face-to-face 100% of this time.    Complications   Complications documented before study signed (09/27/2016 6:13 AM EDT)    RIGHT/LEFT HEART CATH AND CORONARY ANGIOGRAPHY   None Documented by Elder NegusPatwardhan, Manish J, MD 09/26/2016 5:50 PM EDT  Time Range: Intra-procedure      Coronary Findings   Dominance: Right  Left Main  Vessel was injected. Vessel is normal in caliber. Vessel is angiographically normal.  Left Anterior Descending  Vessel was injected. Vessel is normal in caliber. Vessel is angiographically normal.  Ramus Intermedius  Vessel was injected. Vessel is normal in caliber. Vessel is angiographically normal.  Left Circumflex  Vessel was injected. Vessel is normal in caliber. Vessel is angiographically normal.  Right Coronary Artery  Vessel was injected. Vessel is normal in caliber. Vessel is angiographically normal.  Right Heart   Right Heart Pressures RA mean pressure 1 mmHg, A wave 6 mmHg, V wave 3 mmHg RV pressure 43/1 mmHg with RVEDP 3 mmHg PA pressure 37/9 mmHg with mean pressure 19 mmHg PCWP 9 mmHg, A wave 10 mmHg, V wave 14 mmHg    Coronary Diagrams   Diagnostic Diagram       Implants     No implant documentation for this case.  MERGE Images   Show images for Cardiac catheterization   Link to Procedure Log   Procedure Log    Hemo Data    Most Recent Value  Fick Cardiac Output 3.99 L/min  Fick Cardiac Output Index 2.42 (L/min)/BSA  RA A Wave 6 mmHg  RA V Wave 3 mmHg  RA Mean 1 mmHg  RV Systolic Pressure 43 mmHg  RV Diastolic Pressure -1 mmHg  RV EDP 3 mmHg  PA Systolic Pressure 37 mmHg  PA Diastolic Pressure 9 mmHg  PA Mean 19 mmHg  PW A Wave 10 mmHg  PW V Wave 14 mmHg  PW Mean 9 mmHg  AO Systolic Pressure 172 mmHg  AO Diastolic Pressure 78 mmHg  AO Mean 111 mmHg  QP/QS 1  TPVR Index 7.86 HRUI  TSVR Index 45.92 HRUI  PVR SVR Ratio 0.09  TPVR/TSVR Ratio 0.17    Cardiac  TAVR CT  TECHNIQUE: The patient was scanned on a Siemens Force 192 slice scanner. A 120 kV retrospective scan was triggered in the ascending thoracic aorta at 140 HU's. Gantry rotation speed was 250 msecs and collimation was .6 mm. No beta blockade or nitro were given. The 3D data set was reconstructed in 5% intervals of the R-R cycle. Systolic and diastolic phases were analyzed on a dedicated work station using MPR, MIP and VRT modes. The patient received 80 cc of contrast.  FINDINGS: Aortic Valve: The is a 21 mm Edwards stented bovine pericardial Magna Ease bioprosthetic  valve There is no stent creep with post to post diameter about 18 mm. The valve appears small compared to the coronary sinuses The leaflets only thicken near the base. There is no appreciable calcification. There does appear to be commissural fusion near the base especially between the right leaflet. The base of the leaflets are thickened and hypodense but this more likely represents artifact from the stents as opposed to subclinical thrombus. There is no appreciable pannus seen.  Aorta:  27.5 mm  Sino-tubular Junction:  26 mm  Ascending Thoracic Aorta:  27.5 mm  Aortic Arch:  25 mm  Descending Thoracic Aorta:  Tortuous 23 mm  Sinus of Valsalva Measurements:  Non-coronary:  30.9 mm  Right - coronary:  29.8 mm  Left -   coronary:  31.5 mm  Coronary Artery Height above Annulus:  Left Main:  11.3 mm above annulus  Right Coronary:  16 mm above annulus  Virtual Basal Annulus Measurements:  Maximum / Minimum Diameter: Inner diameter of bioprosthetic valve 21 mm  Perimeter:  68.9 mm  Area:  377 mm2  IMPRESSION: 1) 21 mm Magna Ease bioprosthetic valve. Planimetered area 1.5 cm2. Some thickening of the basal leaflets and commissural fusion but no obvious subclinical thrombus or pannus  2) Suitable coronary height for valve in valve procedure  3) Normal Internal Diameter ID  21 mm for this valve  4) Normal aortic root 27.5 mm  5) Relatively large coronary sinuses for implanted valve  Charlton Haws  Electronically Signed: By: Charlton Haws M.D. On: 11/09/2016 18:37    CT ANGIOGRAPHY CHEST, ABDOMEN AND PELVIS  TECHNIQUE: Multidetector CT imaging through the chest, abdomen and pelvis was performed using the standard protocol during bolus administration of intravenous contrast. Multiplanar reconstructed images and MIPs were obtained and reviewed to evaluate the vascular anatomy.  CONTRAST:  100 mL of Isovue 370.  COMPARISON:  CT of the abdomen and pelvis 12/31/2002.  FINDINGS: CTA CHEST FINDINGS  Cardiovascular: Heart size is enlarged. There is no significant pericardial fluid, thickening or pericardial calcification. Aortic atherosclerosis (mild), without evidence of aneurysm or dissection. Status post median sternotomy for aortic valve replacement with a stented bioprosthesis. Status post mitral annuloplasty.  Mediastinum/Lymph Nodes: No pathologically enlarged mediastinal or hilar lymph nodes. Esophagus is unremarkable in appearance. No axillary lymphadenopathy.  Lungs/Pleura: No suspicious appearing pulmonary nodules or masses. No acute consolidative airspace disease. No pleural effusions.  Musculoskeletal/Soft Tissues: Median sternotomy wires. There are no aggressive appearing lytic or blastic lesions noted in the visualized portions of the skeleton.  CTA ABDOMEN AND PELVIS FINDINGS  Hepatobiliary: 12 mm low-attenuation lesion between segments 6 and 7 in the liver is only slightly larger than remote prior study from 2004, most compatible with a small cyst. No suspicious hepatic lesions. No intra or extrahepatic biliary ductal dilatation. Gallbladder is normal in appearance.  Pancreas: No pancreatic mass. No pancreatic ductal dilatation. No pancreatic or peripancreatic fluid or inflammatory changes.  Spleen:  Unremarkable.  Adrenals/Urinary Tract: Multiple well-defined low-attenuation lesions in both kidneys, compatible with simple cysts, largest of which measures up to 2.7 cm in the upper pole the left kidney. In addition, there are several subcentimeter low-attenuation lesions in both kidneys, too small to definitively characterize, but favored to represent tiny cysts. Bilateral adrenal glands are normal in appearance. No hydroureteronephrosis. Urinary bladder is partially obscured by beam hardening artifact from the patient's right hip arthroplasty, but is unremarkable in appearance.  Stomach/Bowel: Normal appearance of the stomach. No pathologic dilatation of  small bowel or colon. The appendix is not confidently identified and may be surgically absent. Regardless, there are no inflammatory changes noted adjacent to the cecum to suggest the presence of an acute appendicitis at this time.  Vascular/Lymphatic: Aortic atherosclerosis, without evidence of aneurysm or dissection in the abdominal or pelvic vasculature. Vascular findings and measurements pertinent to potential TAVR procedure, as detailed below. Celiac axis, superior mesenteric artery and inferior mesenteric artery are all widely patent without hemodynamically significant stenosis. Single renal artery is bilaterally appear widely patent. No lymphadenopathy noted in the abdomen or pelvis.  Reproductive: Coarse calcification in the uterine fundus, presumably a calcified fibroid. Ovaries are atrophic.  Other: No significant volume of ascites.  No pneumoperitoneum.  Musculoskeletal: Status post right hip arthroplasty. Advanced joint space narrowing, subchondral sclerosis, subchondral cyst formation and osteophyte formation in the left hip joint, compatible with severe osteoarthritis. Left-sided protrusio acetabulae. There are no aggressive appearing lytic or blastic lesions noted in the visualized portions of the  skeleton.  VASCULAR MEASUREMENTS PERTINENT TO TAVR:  AORTA:  Minimal Aortic Diameter -  20 x 18 mm  Severity of Aortic Calcification -  mild  RIGHT PELVIS:  Right Common Iliac Artery -  Minimal Diameter - 12.2 x 11.9 mm  Tortuosity - moderate  Calcification - mild  Right External Iliac Artery -  Minimal Diameter - 8.2 x 7.8 mm  Tortuosity - moderate  Calcification - none  Right Common Femoral Artery -  Minimal Diameter - 7.8 x 7.8 mm  Tortuosity - mild  Calcification - none  LEFT PELVIS:  Left Common Iliac Artery -  Minimal Diameter - 12.8 x 11.6 mm  Tortuosity - moderate  Calcification - mild  Left External Iliac Artery -  Minimal Diameter - 9.0 x 8.0 mm  Tortuosity - moderate to severe  Calcification - none  Left Common Femoral Artery -  Minimal Diameter - 7.3 x 8.4 mm  Tortuosity - mild  Calcification - none  Review of the MIP images confirms the above findings.  IMPRESSION: 1. Vascular findings and measurements pertinent to potential TAVR procedure, as detailed above. This patient does have suitable pelvic arterial access bilaterally. 2. Status post aortic valve replacement with a stented aortic bioprosthesis. 3. Cardiomegaly. 4. Aortic atherosclerosis. 5. Additional incidental findings, as above. Aortic Atherosclerosis (ICD10-I70.0).   Electronically Signed   By: Trudie Reed M.D.   On: 11/13/2016 12:12   Impression:  I have personally reviewed the transthoracic echocardiogram performed in April of this year, the transesophageal echocardiogram performed in July, diagnostic cardiac catheterization performed in August, and CT angiograms performed more recently.   According to Dr. Verl Dicker records, the patient had normal LV systolic function but moderately elevated transvalvular gradient across the aortic valve prior to the patient's hip replacement last spring.  Immediately following hip  replacement the mean transvalvular gradient was estimated approximately 40 mmHg in the setting of severe iron deficient anemia. It is unclear whether not the patient was on dopamine at the time. Subsequent transesophageal echocardiogram performed in early July revealed mean transvalvular gradient estimated 33 mmHg, but all 3 leaflets appeared to be moving reasonably well with minimal leaflet restriction and aortic valve area estimated greater than 1.8 cm by planimetry. Cardiac gated angiogram of the heart does not reveal findings suggestive of significant pannus formation and all 3 leaflets appear to move reasonably well.  Valve area by planimetry was estimated greater than 1.5 cm. The possibility of micro-thrombus involving portions of the leaflets cannot be  definitively ruled out.  I have personally reviewed the patient's TEE and CT angiogram with Dr. Excell Seltzer and Dr. Eden Emms. We are not sufficiently convinced that the patient has significant leaflet pathology involving her bioprosthetic tissue valve to warrant valve replacement at this time. Transvalvular gradient across the valve clearly appears to be at least moderately elevated, but this may be at least partially related to some degree of patient prosthesis mismatch. It is possible that there may be some leaflet restriction and/or fibrosis near the commissures of the valves, but the limited images from the transesophageal echocardiogram and the cardiac gated CT angiogram do not reveal clear evidence of significant leaflet deterioration, thickening or calcification.  Moreover, the patient denies any symptoms of exertional shortness of breath or chest discomfort, although admittedly she is not very active physically because of severe pain in her hips.  Her episode of transient hypotension and chest discomfort that occurred immediately following hip replacement last spring developed in the setting of profound bradycardia and acute blood loss  anemia.   Plan:  I discussed matters with the patient in the office today.  As a next step we plan to proceed with repeat transthoracic and transesophageal echocardiograms and left and right heart catheterization with direct assessment of the transvalvular gradient across the aortic valve. Pending results we will discuss treatment options further. All of her questions been addressed.   I spent in excess of 60 minutes during the conduct of this office consultation and >50% of this time involved direct face-to-face encounter with the patient for counseling and/or coordination of their care.     Salvatore Decent. Cornelius Moras, MD 11/21/2016 2:14 PM

## 2016-11-24 ENCOUNTER — Inpatient Hospital Stay (HOSPITAL_COMMUNITY): Admission: RE | Admit: 2016-11-24 | Payer: Medicare Other | Source: Ambulatory Visit

## 2016-11-28 ENCOUNTER — Inpatient Hospital Stay: Admit: 2016-11-28 | Payer: Medicare Other | Admitting: Cardiovascular Disease

## 2016-11-28 SURGERY — IMPLANTATION, AORTIC VALVE, TRANSCATHETER, FEMORAL APPROACH
Anesthesia: General | Site: Chest

## 2016-12-12 ENCOUNTER — Ambulatory Visit (HOSPITAL_BASED_OUTPATIENT_CLINIC_OR_DEPARTMENT_OTHER): Payer: Medicare Other

## 2016-12-12 ENCOUNTER — Inpatient Hospital Stay (HOSPITAL_COMMUNITY)
Admission: AD | Admit: 2016-12-12 | Discharge: 2016-12-13 | DRG: 309 | Disposition: A | Discharge status: Home / Self Care | Payer: Medicare Other | Source: Ambulatory Visit | Attending: Cardiovascular Disease | Admitting: Cardiovascular Disease

## 2016-12-12 ENCOUNTER — Encounter (HOSPITAL_COMMUNITY)
Admission: AD | Disposition: A | Discharge status: Home / Self Care | Payer: Self-pay | Source: Ambulatory Visit | Attending: Cardiology

## 2016-12-12 ENCOUNTER — Other Ambulatory Visit: Payer: Self-pay | Admitting: Cardiovascular Disease

## 2016-12-12 ENCOUNTER — Encounter (HOSPITAL_COMMUNITY): Payer: Self-pay | Admitting: *Deleted

## 2016-12-12 DIAGNOSIS — Z9889 Other specified postprocedural states: Secondary | ICD-10-CM

## 2016-12-12 DIAGNOSIS — Z953 Presence of xenogenic heart valve: Secondary | ICD-10-CM

## 2016-12-12 DIAGNOSIS — I442 Atrioventricular block, complete: Secondary | ICD-10-CM | POA: Diagnosis present

## 2016-12-12 DIAGNOSIS — I110 Hypertensive heart disease with heart failure: Secondary | ICD-10-CM | POA: Diagnosis not present

## 2016-12-12 DIAGNOSIS — Z96641 Presence of right artificial hip joint: Secondary | ICD-10-CM | POA: Diagnosis not present

## 2016-12-12 DIAGNOSIS — Z7982 Long term (current) use of aspirin: Secondary | ICD-10-CM

## 2016-12-12 DIAGNOSIS — Z952 Presence of prosthetic heart valve: Secondary | ICD-10-CM | POA: Diagnosis not present

## 2016-12-12 DIAGNOSIS — Z79899 Other long term (current) drug therapy: Secondary | ICD-10-CM | POA: Diagnosis not present

## 2016-12-12 DIAGNOSIS — Z8249 Family history of ischemic heart disease and other diseases of the circulatory system: Secondary | ICD-10-CM | POA: Diagnosis not present

## 2016-12-12 DIAGNOSIS — I359 Nonrheumatic aortic valve disorder, unspecified: Secondary | ICD-10-CM

## 2016-12-12 DIAGNOSIS — M1612 Unilateral primary osteoarthritis, left hip: Secondary | ICD-10-CM | POA: Diagnosis present

## 2016-12-12 DIAGNOSIS — T82857A Stenosis of cardiac prosthetic devices, implants and grafts, initial encounter: Secondary | ICD-10-CM | POA: Diagnosis not present

## 2016-12-12 DIAGNOSIS — I5032 Chronic diastolic (congestive) heart failure: Secondary | ICD-10-CM | POA: Diagnosis present

## 2016-12-12 DIAGNOSIS — I10 Essential (primary) hypertension: Secondary | ICD-10-CM | POA: Diagnosis present

## 2016-12-12 DIAGNOSIS — R001 Bradycardia, unspecified: Secondary | ICD-10-CM | POA: Diagnosis not present

## 2016-12-12 DIAGNOSIS — I495 Sick sinus syndrome: Secondary | ICD-10-CM

## 2016-12-12 DIAGNOSIS — Y831 Surgical operation with implant of artificial internal device as the cause of abnormal reaction of the patient, or of later complication, without mention of misadventure at the time of the procedure: Secondary | ICD-10-CM | POA: Diagnosis present

## 2016-12-12 DIAGNOSIS — I350 Nonrheumatic aortic (valve) stenosis: Secondary | ICD-10-CM | POA: Diagnosis not present

## 2016-12-12 DIAGNOSIS — M1611 Unilateral primary osteoarthritis, right hip: Secondary | ICD-10-CM | POA: Diagnosis present

## 2016-12-12 HISTORY — PX: TEE WITHOUT CARDIOVERSION: SHX5443

## 2016-12-12 HISTORY — DX: Chronic diastolic (congestive) heart failure: I50.32

## 2016-12-12 LAB — BASIC METABOLIC PANEL
ANION GAP: 8 (ref 5–15)
BUN: 20 mg/dL (ref 6–20)
CHLORIDE: 107 mmol/L (ref 101–111)
CO2: 24 mmol/L (ref 22–32)
Calcium: 10.8 mg/dL — ABNORMAL HIGH (ref 8.9–10.3)
Creatinine, Ser: 1.17 mg/dL — ABNORMAL HIGH (ref 0.44–1.00)
GFR calc Af Amer: 51 mL/min — ABNORMAL LOW (ref >60)
GFR, EST NON AFRICAN AMERICAN: 44 mL/min — AB (ref >60)
GLUCOSE: 84 mg/dL (ref 65–99)
POTASSIUM: 4.1 mmol/L (ref 3.5–5.1)
Sodium: 139 mmol/L (ref 135–145)

## 2016-12-12 LAB — PROTIME-INR
INR: 1.09
Prothrombin Time: 14 seconds (ref 11.4–15.2)

## 2016-12-12 LAB — CBC
HCT: 37.3 % (ref 36.0–46.0)
HEMOGLOBIN: 12.3 g/dL (ref 12.0–15.0)
MCH: 30.9 pg (ref 26.0–34.0)
MCHC: 33 g/dL (ref 30.0–36.0)
MCV: 93.7 fL (ref 78.0–100.0)
PLATELETS: 140 10*3/uL — AB (ref 150–400)
RBC: 3.98 MIL/uL (ref 3.87–5.11)
RDW: 15.2 % (ref 11.5–15.5)
WBC: 5 10*3/uL (ref 4.0–10.5)

## 2016-12-12 LAB — MRSA PCR SCREENING: MRSA by PCR: NEGATIVE

## 2016-12-12 SURGERY — RIGHT/LEFT HEART CATH AND CORONARY ANGIOGRAPHY
Anesthesia: LOCAL

## 2016-12-12 SURGERY — ECHOCARDIOGRAM, TRANSESOPHAGEAL
Anesthesia: Moderate Sedation

## 2016-12-12 MED ORDER — TRAMADOL HCL 50 MG PO TABS
50.0000 mg | ORAL_TABLET | Freq: Four times a day (QID) | ORAL | Status: DC | PRN
  Administered 2016-12-12 – 2016-12-13 (×2): 50 mg via ORAL
  Filled 2016-12-12 (×2): qty 1

## 2016-12-12 MED ORDER — HEPARIN SODIUM (PORCINE) 5000 UNIT/ML IJ SOLN
5000.0000 [IU] | Freq: Three times a day (TID) | INTRAMUSCULAR | Status: DC
  Administered 2016-12-12 – 2016-12-13 (×2): 5000 [IU] via SUBCUTANEOUS
  Filled 2016-12-12 (×2): qty 1

## 2016-12-12 MED ORDER — MIDAZOLAM HCL 5 MG/ML IJ SOLN
INTRAMUSCULAR | Status: AC
  Filled 2016-12-12: qty 2

## 2016-12-12 MED ORDER — ADULT MULTIVITAMIN W/MINERALS CH
1.0000 | ORAL_TABLET | Freq: Every day | ORAL | Status: DC
  Administered 2016-12-13: 1 via ORAL
  Filled 2016-12-12: qty 1

## 2016-12-12 MED ORDER — INFLUENZA VAC SPLIT HIGH-DOSE 0.5 ML IM SUSY
0.5000 mL | PREFILLED_SYRINGE | INTRAMUSCULAR | Status: DC
  Filled 2016-12-12: qty 0.5

## 2016-12-12 MED ORDER — ATROPINE SULFATE 1 MG/10ML IJ SOSY: 1.0000 mg | PREFILLED_SYRINGE | Freq: Once | INTRAMUSCULAR | Status: DC

## 2016-12-12 MED ORDER — SPIRONOLACTONE 25 MG PO TABS
25.0000 mg | ORAL_TABLET | Freq: Every day | ORAL | Status: DC
  Administered 2016-12-13: 25 mg via ORAL
  Filled 2016-12-12: qty 1

## 2016-12-12 MED ORDER — SODIUM CHLORIDE 0.9% FLUSH: 3.0000 mL | INTRAVENOUS | Status: DC | PRN

## 2016-12-12 MED ORDER — SODIUM CHLORIDE 0.9 % IV SOLN
INTRAVENOUS | Status: DC
  Administered 2016-12-12: 09:00:00 via INTRAVENOUS

## 2016-12-12 MED ORDER — SODIUM CHLORIDE 0.9 % WEIGHT BASED INFUSION: 3.0000 mL/kg/h | INTRAVENOUS | Status: AC

## 2016-12-12 MED ORDER — ATROPINE SULFATE 1 MG/10ML IJ SOSY
1.0000 mg | PREFILLED_SYRINGE | INTRAMUSCULAR | Status: DC | PRN
  Administered 2016-12-12 – 2016-12-13 (×4): 1 mg via INTRAVENOUS
  Filled 2016-12-12 (×4): qty 10

## 2016-12-12 MED ORDER — ATROPINE SULFATE 1 MG/10ML IJ SOSY
PREFILLED_SYRINGE | INTRAMUSCULAR | Status: AC
  Filled 2016-12-12: qty 10

## 2016-12-12 MED ORDER — ATROPINE SULFATE 1 MG/10ML IJ SOSY
1.0000 mg | PREFILLED_SYRINGE | Freq: Once | INTRAMUSCULAR | Status: AC
  Administered 2016-12-12: 1 mg via INTRAVENOUS

## 2016-12-12 MED ORDER — BUTAMBEN-TETRACAINE-BENZOCAINE 2-2-14 % EX AERO
INHALATION_SPRAY | CUTANEOUS | Status: DC | PRN
  Administered 2016-12-12: 2 via TOPICAL

## 2016-12-12 MED ORDER — ASPIRIN EC 81 MG PO TBEC: 81.0000 mg | DELAYED_RELEASE_TABLET | Freq: Every day | ORAL | Status: DC

## 2016-12-12 MED ORDER — MIDAZOLAM HCL 10 MG/2ML IJ SOLN
INTRAMUSCULAR | Status: DC | PRN
  Administered 2016-12-12: 1 mg via INTRAVENOUS
  Administered 2016-12-12 (×2): 2 mg via INTRAVENOUS

## 2016-12-12 MED ORDER — ASPIRIN 81 MG PO CHEW
81.0000 mg | CHEWABLE_TABLET | Freq: Two times a day (BID) | ORAL | Status: DC
  Administered 2016-12-12 – 2016-12-13 (×2): 81 mg via ORAL
  Filled 2016-12-12 (×2): qty 1

## 2016-12-12 MED ORDER — SODIUM CHLORIDE 0.9 % IV SOLN: 250.0000 mL | INTRAVENOUS | Status: DC | PRN

## 2016-12-12 MED ORDER — FENTANYL CITRATE (PF) 100 MCG/2ML IJ SOLN
INTRAMUSCULAR | Status: AC
  Filled 2016-12-12: qty 2

## 2016-12-12 MED ORDER — VITAMIN C 500 MG PO TABS
500.0000 mg | ORAL_TABLET | Freq: Every day | ORAL | Status: DC
  Administered 2016-12-13: 500 mg via ORAL
  Filled 2016-12-12: qty 1

## 2016-12-12 MED ORDER — SODIUM CHLORIDE 0.9 % WEIGHT BASED INFUSION: 1.0000 mL/kg/h | INTRAVENOUS | Status: DC

## 2016-12-12 MED ORDER — FENTANYL CITRATE (PF) 100 MCG/2ML IJ SOLN
INTRAMUSCULAR | Status: DC | PRN
  Administered 2016-12-12: 25 ug via INTRAVENOUS

## 2016-12-12 MED ORDER — SODIUM CHLORIDE 0.9% FLUSH
3.0000 mL | Freq: Two times a day (BID) | INTRAVENOUS | Status: DC
  Administered 2016-12-12: 3 mL via INTRAVENOUS

## 2016-12-12 NOTE — Consult Note (Signed)
 Cardiology Consultation:   Patient ID: [name]; [id number]; 12/26/1940   Admit date: 12/12/2016 Date of Consult: 12/12/2016  Primary Care Provider: Wong, Francis P, MD Primary Cardiologist: Dr. Ganji TAVR eval: Dr. Cooper Primary Electrophysiologist:  Dr. Gavriella Hearst (2014)   Patient Profile:   [name] is a 76 y.o. 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 Nelson.  History of Present Illness:   Ms. Punch came today for TEE and cath procedure as part of here w/u for he progressive AS and TAVR evaluation.  During the procedure she became profoundly bradycardic with rates towards 20's with prolonged pauses nearly 6 seconds, requiring atropine and   This April she underwent hip surgery inter-op she was observed to have marked SB/junctional bradycardia, she was seen by Dr. Ganji who knows her, he did not feel anything needed to be done with known bradycardia for her and out patient completely asymptomatic.  She apparently did require dopamine post-op 2/2 hypotension/bradycardia though he felt the hypotension was related more to acute blood loss and the patient was transfused and wean off dopamine, eventually discharged.   She was evaluated by Dr. Sharlena Kristensen in 2014 for CHG on drugs that resolved off meds, remained w/SB 40's though asymptomatic.  I have discussed the patient with Dr. Ganji who knows her well.  She has had long standing bradycardia and has done quite well without symptoms of any exertional intolerances (outside of orthopedic issues) or symptoms of bradycardia.  He reports she has had variable degrees of heart block over the years including Mobitz II and CHB, though given no symptoms no pacing was pursued.  The patient has done well though now limited with worsening L hip pain and given her  brady/hypotension issues in April with her left hip surgery, cardiac evaluation of her worsening AS was undertaken.  Given recurrent bradycardic event he is concerned about her pending  Hip surgery as well as potential AV intervention/surgery needs and thinks it may be time for pacing.  The patient tells me outside of her hip pain she feels very well.  She denies any dizziness, near syncope or syncope.  No CP or SOB, no DOE.  She does her PT exercises for her hip without exertional intolerances.  She tells me she doesn't feel like she needs a pacemaker, but if everyone thinks she does then she would be willing to consider it.    Past Medical History:  Diagnosis Date  . Aortic stenosis   . Arthritis   . Atrial fibrillation (HCC)    Status post cardioversion November 2013  . CHF (congestive heart failure) (HCC)   . Dysrhythmia   . Hypertension   . Shortness of breath    hx dyspnea and respiratory abnormalities    Past Surgical History:  Procedure Laterality Date  . AORTIC VALVE REPLACEMENT  10/12/2011   Procedure: AORTIC VALVE REPLACEMENT (AVR);  Surgeon: Bryan K Bartle, MD;  Location: [facility] OR;  Service: Open Heart Surgery;  Laterality: N/A;  . CARDIAC CATHETERIZATION    . CARDIOVERSION  12/26/2011   Procedure: CARDIOVERSION;  Surgeon: Jagadeesh R Ganji, MD;  Location: [facility] ENDOS[facility];  Service: Cardiovascular;  Laterality: N/A;  . CORONARY ANGIOGRAPHY N/A 09/26/2016   Procedure: CORONARY ANGIOGRAPHY;  Surgeon: Patwardhan, Manish J, MD;  Location: [facility] INVASIVE CV LAB;  Service:   Cardiovascular;  Laterality: N/A;  . EXPLORATION POST OPERATIVE OPEN HEART  10/12/2011   Procedure: EXPLORATION POST OPERATIVE OPEN HEART;  Surgeon: Bryan K Bartle, MD;  Location: [facility] OR;  Service: Open Heart Surgery;  Laterality: N/A;  . LEFT AND RIGHT HEART CATHETERIZATION WITH CORONARY ANGIOGRAM N/A 09/11/2011   Procedure: LEFT AND RIGHT HEART CATHETERIZATION WITH CORONARY ANGIOGRAM;  Surgeon: Jagadeesh R Ganji, MD;   Location: [facility] CATH LAB;  Service: Cardiovascular;  Laterality: N/A;  . MITRAL VALVE REPAIR  10/12/2011   Procedure: MITRAL VALVE REPAIR (MVR);  Surgeon: Bryan K Bartle, MD;  Location: [facility] OR;  Service: Open Heart Surgery;  Laterality: N/A;  mitral valve annuloplasty  . RIGHT HEART CATH N/A 09/26/2016   Procedure: RIGHT HEART CATH;  Surgeon: Patwardhan, Manish J, MD;  Location: [facility];  Service: Cardiovascular;  Laterality: N/A;  . TEE WITHOUT CARDIOVERSION N/A 08/22/2016   Procedure: TRANSESOPHAGEAL ECHOCARDIOGRAM (TEE);  Surgeon: Ganji, Jay, MD;  Location: [facility];  Service: Cardiovascular;  Laterality: N/A;  . TOTAL HIP ARTHROPLASTY Right 06/15/2016   Procedure: RIGHT TOTAL HIP ARTHROPLASTY ANTERIOR APPROACH;  Surgeon: Brian Swinteck, MD;  Location: [facility] ORS;  Service: Orthopedics;  Laterality: Right;  Dr. needs RNFA       Inpatient Medications: Scheduled Meds: . aspirin  81 mg Oral BID  . heparin  5,000 Units Subcutaneous Q8H  . [START ON 12/13/2016] multivitamin with minerals  1 tablet Oral Daily  . sodium chloride flush  3 mL Intravenous Q12H  . [START ON 12/13/2016] spironolactone  25 mg Oral Daily  . [START ON 12/13/2016] vitamin C  500 mg Oral Daily   Continuous Infusions: . sodium chloride 20 mL/hr at 12/12/16 0906  . sodium chloride    . sodium chloride     PRN Meds: sodium chloride, atropine, sodium chloride flush  Allergies:   No Known Allergies  Social History:   Social History   Social History  . Marital status: Single    Spouse name: N/A  . Number of children: N/A  . Years of education: N/A   Occupational History  . Not on file.   Social History Main Topics  . Smoking status: Never Smoker  . Smokeless tobacco: Never Used  . Alcohol use No  . Drug use: No  . Sexual activity: Not on file   Other Topics Concern  . Not on file   Social History Narrative  . No narrative on file    Family History:   Family History  Problem Relation Age of  Onset  . Hypertension Mother      ROS:  Please see the history of present illness.  ROS  All other ROS reviewed and negative.     Physical Exam/Data:   Vitals:   12/12/16 1248 12/12/16 1258 12/12/16 1308 12/12/16 1318  BP: (!) 123/57 121/60 127/60 139/65  Pulse: (!) 41 (!) 45 (!) 41 (!) 43  Resp: (!) 22 (!) 22 (!) 21 16  Temp:      TempSrc:      SpO2: 98% 100% 100% 100%  Weight:      Height:       No intake or output data in the 24 hours ending 12/12/16 1415 Filed Weights   12/12/16 0855  Weight: 129 lb (58.5 kg)   Body mass index is 21.8 kg/m.  General:  Well nourished, well developed, in no acute distress HEENT: normal Lymph: no adenopathy Neck: no JVD Endocrine:  No thryomegaly Vascular: No carotid bruits    Cardiac:  RRR; loud AS murmur Lungs:  CTA b/l, no wheezing, rhonchi or rales  Abd: soft, nontender, no hepatomegaly  Ext: no edema Musculoskeletal:  No deformities, BUE and BLE strength normal and equal Skin: warm and dry  Neuro:  No focal abnormalities noted Psych:  Normal affect   EKG:  The EKG was personally reviewed and demonstrates:   SB 42bpm, PR 192ms, QRS 108ms, QTc 392ms 09/26/16 SB 49bpm 06/18/16 SB 55bpm, 1st degree AVbock, PR 234ms 06/16/16 SB, junctional escape beats rates 30's Telemetry:  Telemetry was personally reviewed and demonstrates:   Earlier today bradycardic with junctional escape (narrow QRS) rates 20s', pause nearly 6 seconds Currently is SR 40's-60's  Relevant CV Studies:  12/12/16 TEE Study Conclusions - Left ventricle: There was mild concentric hypertrophy. Systolic   function was normal. The estimated ejection fraction was in the   range of 60% to 65%. Wall motion was normal; there were no   regional wall motion abnormalities. - Aortic valve: A bioprosthetic Edward Magna Ease 21 mm sits well   in the aortic position. Leaflets open well. There is no central   aortic regurgitation or paravalvular leak. Transaortic gradients    are severely elevated with peak/mean 126/44 mmHg. There was   severe stenosis. - Aorta: There was mild non-mobile atheroma. - Ascending aorta: The ascending aorta was normal in size. - Descending aorta: The descending aorta was normal in size. - Mitral valve: S/p annuloplasty ring 28 mm. - Left atrium: The atrium was dilated. No evidence of thrombus in   the atrial cavity or appendage. - Right atrium: The atrium was dilated. No evidence of thrombus in   the atrial cavity or appendage. - Tricuspid valve: There was moderate regurgitation. - Pulmonary arteries: Systolic pressure was moderately increased.   PA peak pressure: 53 mm Hg (S). Impressions: - A bioprosthetic Edward Magna Ease 21 mm sits well in the aortic   position. Leaflets open well. There is no central aortic   regurgitation or paravalvular leak. Transaortic gradients are   severely elevated with peak/mean 126/44 mmHg. Good leaflet   opening. Small ring. No obvious calcifications or pannus.Possible   prosthesis to patient mismatch.   S/p annuloplasty ring 28 mm. Normal transmitral gradients. No   mitral regurgitation or paravalvular leak.  Laboratory Data:  Chemistry Recent Labs Lab 12/12/16 0945  NA 139  K 4.1  CL 107  CO2 24  GLUCOSE 84  BUN 20  CREATININE 1.17*  CALCIUM 10.8*  GFRNONAA 44*  GFRAA 51*  ANIONGAP 8    No results for input(s): PROT, ALBUMIN, AST, ALT, ALKPHOS, BILITOT in the last 168 hours. Hematology Recent Labs Lab 12/12/16 0945  WBC 5.0  RBC 3.98  HGB 12.3  HCT 37.3  MCV 93.7  MCH 30.9  MCHC 33.0  RDW 15.2  PLT 140*   Cardiac EnzymesNo results for input(s): TROPONINI in the last 168 hours. No results for input(s): TROPIPOC in the last 168 hours.  BNPNo results for input(s): BNP, PROBNP in the last 168 hours.  DDimer No results for input(s): DDIMER in the last 168 hours.  Radiology/Studies:  No results found.  Assessment and Plan:   1. Bradycardia, sinus pauses      Longstanding asymptomatic bradycardia     In d/w Dr. Ganji as noted above, variable degrees of AV block over the years has been observed including CHB.     Post-op in April he felt that part of the issue was lack of chronotropic   ability to aid in response to her anemia that she had persistent hypotension, feels now with pending hip surgery and potential needs for AV intervention of some kind, evaluation for PPM is reasonable     On no nodal blocking/rate limiting medicines   2. Severe AS (s/p bioprosthetic AVR 2013)     D/w Dr. Nelson, felt to be valve mismatch, leaflets opening well     Undecided how best to treat, Dr. nelson reports ongoing conversation with Dr. Cooper and Dr. Ganji      not planned for cath at this time.         The patient will be seen later today by Dr.Jonnathan Birman.  She denies any exertional intolerances outside of hip pain, no hx of syncope or near syncope.  She has known variable heart block.  Likely will need pacing. Uncertain what kind of intervention for her aortic stenosis is planned for.            For questions or updates, please contact [facility] Please consult [email or web address] for contact info under Cardiology/STEMI.   Signed, Renee Lynn Ursuy, PA-C  12/12/2016 2:15 PM  EP attending  Patient seen and examined. Agree with the findings as noted above. We are asked to see the patient by Dr. Nelson for evaluation of sinus bradycardia which was symptomatic as an associated with hypotension during the patient's TEE earlier today. She has not had syncope. I saw the patient for years ago for symptomatic sinus node dysfunction but she was on sinus nodal blocking drugs and these were held and her heart rate improved. She has had long-standing sinus bradycardia but has been asymptomatic. She is symptomatic with regard to hip pain. She has fairly severe arthritis and is being evaluated for hip replacement surgery. Initial 2-D echo followed by TEE has demonstrated  severe aortic stenosis with a mean gradient of 44 mmHg in the setting of preserved left ventricular systolic function and moderate pulmonary hypertension. The patient is sedentary. She denies chest pain, syncope, or shortness of breath. She has not had peripheral edema. On exam she is a pleasant elderly appearing woman in no distress. Vitals are documented above. Her exam reveals a regular rate rhythm with a single S2. There is a very low pitched systolic murmur consistent with aortic stenosis. Lungs are clear, and the extremities demonstrated no edema. ECG demonstrates sinus bradycardia with right atrial enlargement and left axis.  The patient has a complicated situation. If hip replacement surgery could be avoided, I think her lack of symptoms would suggest that no surgery or pacemaker implantation be done. She states that her quality of life is very severely impaired as it hurts whenever she walks.Her pain has not been well-controlled. With her documented bradycardia, pacemaker insertion would be strongly considered if hip surgery were to be pursued. However her aortic stenosis would make her surgical risk for hip surgery very high. Reviewing her records suggest that TAVR might be more difficult because of a previously replaced aortic valve with a 21 mm valve. I will plan to discuss with our surgical structural heart colleagues. If hip surgery is not going to be recommended, I would avoid pacemaker at the present time.  Donney Caraveo, M.D. 

## 2016-12-12 NOTE — Interval H&P Note (Signed)
History and Physical Interval Note:  12/12/2016 10:22 AM  [name]  has presented today for surgery, with the diagnosis of AORTIC STENOSIS  The various methods of treatment have been discussed with the patient and family. After consideration of risks, benefits and other options for treatment, the patient has consented to  Procedure(s): TRANSESOPHAGEAL ECHOCARDIOGRAM (TEE) (N/A) as a surgical intervention .  The patient's history has been reviewed, patient examined, no change in status, stable for surgery.  I have reviewed the patient's chart and labs.  Questions were answered to the patient's satisfaction.     Tobias AlexanderKatarina Aubry Rankin

## 2016-12-12 NOTE — CV Procedure (Signed)
   Transesophageal Echocardiogram Note  [name] [id number] 04/28/1940  Procedure: Transesophageal Echocardiogram Indications: AVR, s/p annuloplasty ring   Procedure Details Consent: Obtained Time Out: Verified patient identification, verified procedure, site/side was marked, verified correct patient position, special equipment/implants available, Radiology Safety Procedures followed,  medications/allergies/relevent history reviewed, required imaging and test results available.  Performed  Medications: During this procedure the patient is administered a total of Versed 5 mg and Fentanyl 50 mcg to achieve and maintain moderate conscious sedation.  The patient's heart rate, blood pressure, and oxygen saturation are monitored continuously during the procedure. The period of conscious sedation is 30 minutes, of which I was present face-to-face 100% of this time.  See TEE report for full dictation.  Complications: No apparent complications Patient did tolerate procedure well.  Tobias AlexanderKatarina Elvan Ebron, MD, [facility] CenterFACC 12/12/2016, 12:14 PM

## 2016-12-12 NOTE — Interval H&P Note (Signed)
History and Physical Interval Note:  12/12/2016 10:19 AM  [name]  has presented today for surgery, with the diagnosis of AORTIC STENOSIS  The various methods of treatment have been discussed with the patient and family. After consideration of risks, benefits and other options for treatment, the patient has consented to  Procedure(s): TRANSESOPHAGEAL ECHOCARDIOGRAM (TEE) (N/A) as a surgical intervention .  The patient's history has been reviewed, patient examined, no change in status, stable for surgery.  I have reviewed the patient's chart and labs.  Questions were answered to the patient's satisfaction.     Tobias AlexanderKatarina Eric Morganti

## 2016-12-12 NOTE — H&P (Signed)
Patient ID: [name] MRN: [id number], DOB/AGE: 08/28/1940   Admit date: 12/12/2016  Primary Physician: Ileana Ladd, MD Primary Cardiologist: Dr Jacinto Halim  Pt. Profile:  S/P AVR, severe aortic stenosis, complete heart block  Problem List  Past Medical History:  Diagnosis Date  . Aortic stenosis   . Arthritis   . Atrial fibrillation [facility])    Status post cardioversion November 2013  . CHF (congestive heart failure) (HCC)   . Dysrhythmia   . Hypertension   . Shortness of breath    hx dyspnea and respiratory abnormalities    Past Surgical History:  Procedure Laterality Date  . AORTIC VALVE REPLACEMENT  10/12/2011   Procedure: AORTIC VALVE REPLACEMENT (AVR);  Surgeon: Alleen Borne, MD;  Location: [facility] OR;  Service: Open Heart Surgery;  Laterality: N/A;  . CARDIAC CATHETERIZATION    . CARDIOVERSION  12/26/2011   Procedure: CARDIOVERSION;  Surgeon: Pamella Pert, MD;  Location: [facility] ENDOSCOPY;  Service: Cardiovascular;  Laterality: N/A;  . CORONARY ANGIOGRAPHY N/A 09/26/2016   Procedure: CORONARY ANGIOGRAPHY;  Surgeon: Elder Negus, MD;  Location: [facility] INVASIVE CV LAB;  Service: Cardiovascular;  Laterality: N/A;  . EXPLORATION POST OPERATIVE OPEN HEART  10/12/2011   Procedure: EXPLORATION POST OPERATIVE OPEN HEART;  Surgeon: Alleen Borne, MD;  Location: [facility] OR;  Service: Open Heart Surgery;  Laterality: N/A;  . LEFT AND RIGHT HEART CATHETERIZATION WITH CORONARY ANGIOGRAM N/A 09/11/2011   Procedure: LEFT AND RIGHT HEART CATHETERIZATION WITH CORONARY ANGIOGRAM;  Surgeon: Pamella Pert, MD;  Location: [facility] CATH LAB;  Service: Cardiovascular;  Laterality: N/A;  . MITRAL VALVE REPAIR  10/12/2011   Procedure: MITRAL VALVE REPAIR (MVR);  Surgeon: Alleen Borne, MD;  Location: [facility] OR;  Service: Open Heart Surgery;  Laterality: N/A;  mitral valve annuloplasty  . RIGHT HEART CATH N/A 09/26/2016   Procedure: RIGHT HEART CATH;  Surgeon: Elder Negus, MD;  Location: [facility]  INVASIVE CV LAB;  Service: Cardiovascular;  Laterality: N/A;  . TEE WITHOUT CARDIOVERSION N/A 08/22/2016   Procedure: TRANSESOPHAGEAL ECHOCARDIOGRAM (TEE);  Surgeon: Yates Decamp, MD;  Location: [facility] ENDOSCOPY;  Service: Cardiovascular;  Laterality: N/A;  . TOTAL HIP ARTHROPLASTY Right 06/15/2016   Procedure: RIGHT TOTAL HIP ARTHROPLASTY ANTERIOR APPROACH;  Surgeon: Samson Frederic, MD;  Location: [facility] ORS;  Service: Orthopedics;  Laterality: Right;  Dr. needs RNFA     Allergies  No Known Allergies  HPI  Patient is a 76 y.o. female with aortic insufficiency, mitral regurgitation, hypertension, and chronic diastolic congestive heart failure who underwent aortic valve replacement using a 21 mm Edwards Magna Ease bovine pericardial tissue valve and mitral valve ring annuloplasty by Dr. Laneta Simmers in 2013.  Her immediate postoperative recovery was notable for mediastinal bleeding requiring reexploration. She subsequently developed atrial fibrillation requiring DC cardioversion. Subsequent follow-up echocardiograms performed at Dr.  Verl Dicker office reportedly demonstrated normalization of left ventricular systolic function with moderately elevated transvalvular gradient across the aortic valve. She was also reported to have chronic paroxysmal complete heart block and junctional rhythm without associated symptoms of symptomatic bradycardia.   She otherwise did well from a cardiac standpoint until April of this year when she underwent elective right total hip replacement for severe degenerative arthritis.  Immediately following surgery she was noted to have paroxysmal junctional escape rhythm and marked sinus bradycardia with first-degree AV block associated with hypotension in the setting of severe acute blood loss anemia with hemoglobin 7.5. She was transfused 2 units of packed  red blood cells and blood pressure was supported using dopamine. Transthoracic echocardiogram performed at that time revealed normal left  ventricular systolic function with ejection fraction estimated 60-65%. Transvalvular gradient across the aortic valve was elevated with peak velocity reported 4.3 m/s corresponding to mean transvalvular gradient estimated 39 mmHg. She recovered fairly quickly and was discharged home without event.   She came today for a right sided cath and TEE to further evaluate her valve. Immediately post procedure she developed sinus bradycardia that led to 3. AVB and ventricular rates down to 20'. Her BP was 105/63 mmHg. She responded to atropin with recovery of SR and ventricular rates in 50'. Tere was a 5 second pause that was recorded.       Past Medical History:  Diagnosis Date  . Aortic stenosis   . Arthritis   . Atrial fibrillation [facility])    Status post cardioversion November 2013  . CHF (congestive heart failure) (HCC)   . Dysrhythmia   . Hypertension   . Shortness of breath    hx dyspnea and respiratory abnormalities   Home Medications  Prior to Admission medications   Medication Sig Start Date End Date Taking? Authorizing Provider  amLODipine (NORVASC) 10 MG tablet Take 10 mg by mouth daily. 10/10/16  Yes [provider]  aspirin 81 MG chewable tablet Chew 1 tablet (81 mg total) by mouth 2 (two) times daily. 06/16/16  Yes Swinteck, Arlys John, MD  Multiple Vitamin (MULTIVITAMIN WITH MINERALS) TABS tablet Take 1 tablet by mouth daily.   Yes [provider]  naproxen sodium (ANAPROX) 220 MG tablet Take 440 mg by mouth 2 (two) times daily as needed (for pain.).   Yes [provider]  traMADol (ULTRAM) 50 MG tablet Take 50-100 mg by mouth every 6 (six) hours as needed for moderate pain.  10/18/16  Yes [provider]  vitamin C (ASCORBIC ACID) 500 MG tablet Take 500 mg by mouth daily.   Yes [provider]  spironolactone (ALDACTONE) 25 MG tablet Take 25 mg by mouth daily. 08/23/11 06/02/17  Cathren Harsh, MD   Family History  Family History    Problem Relation Age of Onset  . Hypertension Mother     Social History  Social History   Social History  . Marital status: Single    Spouse name: N/A  . Number of children: N/A  . Years of education: N/A   Occupational History  . Not on file.   Social History Main Topics  . Smoking status: Never Smoker  . Smokeless tobacco: Never Used  . Alcohol use No  . Drug use: No  . Sexual activity: Not on file   Other Topics Concern  . Not on file   Social History Narrative  . No narrative on file    Review of Systems General:  No chills, fever, night sweats or weight changes.  Cardiovascular:  No chest pain, dyspnea on exertion, edema, orthopnea, palpitations, paroxysmal nocturnal dyspnea. Dermatological: No rash, lesions/masses Respiratory: No cough, dyspnea Urologic: No hematuria, dysuria Abdominal:   No nausea, vomiting, diarrhea, bright red blood per rectum, melena, or hematemesis Neurologic:  No visual changes, wkns, changes in mental status. All other systems reviewed and are otherwise negative except as noted above.  Physical Exam  Blood pressure (!) 123/57, pulse (!) 41, temperature 98.4 F (36.9 C), temperature source Oral, resp. rate (!) 22, height 5' 4.5" (1.638 m), weight 129 lb (58.5 kg), SpO2 98 %.  General: Pleasant, NAD Psych:  Normal affect. Neuro: Alert and oriented X 3. Moves all extremities spontaneously. HEENT: Normal  Neck: Supple without bruits or JVD. Heart: S1,2 5/6 holosystolic murmur Lungs:  Resp regular and unlabored.  Abdomen: Soft, non-tender, non-distended, BS + x 4.  Extremities: No clubbing, cyanosis or edema. DP/PT/Radials 2+ and equal bilaterally.  Labs  No results for input(s): CKTOTAL, CKMB, TROPONINI in the last 72 hours. Lab Results  Component Value Date   WBC 5.0 12/12/2016   HGB 12.3 12/12/2016   HCT 37.3 12/12/2016   MCV 93.7 12/12/2016   PLT 140 (L) 12/12/2016    Recent Labs Lab 12/12/16 0945  NA 139  K 4.1  CL  107  CO2 24  BUN 20  CREATININE 1.17*  CALCIUM 10.8*  GLUCOSE 84   Lab Results  Component Value Date   CHOL 141 08/22/2011   HDL 67 08/22/2011   LDLCALC 60 08/22/2011   TRIG 72 08/22/2011   No results found for: DDIMER Invalid input(s): POCBNP   Radiology/Studies  No results found.  Echocardiogram - 12/12/2016 - Left ventricle: There was mild concentric hypertrophy. Systolic   function was normal. The estimated ejection fraction was in the   range of 60% to 65%. Wall motion was normal; there were no   regional wall motion abnormalities. - Aortic valve: A bioprosthetic [facility] Magna Ease 21 mm sits well   in the aortic position. Leaflets open well. There is no central   aortic regurgitation or paravalvular leak. Transaortic gradients   are severely elevated with peak/mean 126/44 mmHg. There was   severe stenosis. - Aorta: There was mild non-mobile atheroma. - Ascending aorta: The ascending aorta was normal in size. - Descending aorta: The descending aorta was normal in size. - Mitral valve: S/p annuloplasty ring 28 mm. - Left atrium: The atrium was dilated. No evidence of thrombus in   the atrial cavity or appendage. - Right atrium: The atrium was dilated. No evidence of thrombus in   the atrial cavity or appendage. - Tricuspid valve: There was moderate regurgitation. - Pulmonary arteries: Systolic pressure was moderately increased.   PA peak pressure: 53 mm Hg (S).  Impressions:  - A bioprosthetic [facility] Magna Ease 21 mm sits well in the aortic   position. Leaflets open well. There is no central aortic   regurgitation or paravalvular leak. Transaortic gradients are   severely elevated with peak/mean 126/44 mmHg. Good leaflet   opening. Small ring. No obvious calcifications or pannus.Possible   prosthesis to patient mismatch.   S/p annuloplasty ring 28 mm. Normal transmitral gradients. No   mitral regurgitation or paravalvular leak.   ASSESSMENT AND PLAN  1.  3. AVB, transient - 2nd occasion after anesthesia with 5 second pause, EP called for PM placement evaluation  2. S/P AVR - with severe aortic stenosis not present on the original post surgical echocardiograms, on TEE leaflets open well, there is no obvious calcification or pannus, but annular ring appears small, there is a possible prosthesis to patient mismatch.   3. S/P mitral annuloplasty - normal transmitral gradients   DVT PPX - Lovenox sq  Signed, Tobias AlexanderKatarina Valeriano Bain, MD, [facility] 12/12/2016, 1:05 PM

## 2016-12-12 NOTE — Progress Notes (Signed)
  Echocardiogram Echocardiogram Transesophageal has been performed.  Janalyn HarderWest, Maridee Slape R 12/12/2016, 12:07 PM

## 2016-12-12 NOTE — H&P (View-Only) (Signed)
Patient ID: [name], female   DOB: 01/18/1941, 76 y.o.   MRN: [id number]  [facility]  MULTIDISCIPLINARY HEART VALVE CLINIC  CARDIOTHORACIC SURGERY CONSULTATION REPORT  Referring Provider is Ganji, Jay, MD PCP is Wong, Francis P, MD  Chief Complaint  Patient presents with  . Aortic Stenosis    SEVERE BIOPROSTHETIC ...s/p AVR/MVR 2013..1st TAVR eval    HPI:  The patient is a 76 year old woman with a history of hypertension and congestive heart failure secondary to valvular heart disease who underwent AVR with a 21 mm Edwards Magna-Ease pericardial valve and mitral annuloplasty in 2013 by me. She presented at that time with progressive dyspnea and edema and an echo showed moderate AI with right ventricular dysfunction. Since that surgery she has done well with resolution of her dyspnea, fatigue and edema. She has remained active and has only been limited by her bilateral hip arthritis. She underwent right hip replacement on 06/15/2016 and her postop course was complicated by hypotension requiring dopamine. She was anemic and was transfused. She had an echo done on 06/17/2016 showing a normal LVEF of 60-65% with severe prosthetic aortic valve stenosis with a mean gradient of 41 mm Hg and a peak of 80 mm Hg. There was no significant MR or MS. She made a good recovery after her hip replacement. 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 She returned to see Dr. Ganji for cardiac evaluation and had a TEE on 08/22/2016 that showed a mean AV gradient of 33 mm Hg with a peak of 63 mm Hg. The valve leaflets were difficult to see but two of them appeared to be moving and one was fixed. The valve area was estimated at 0.6 to 0.7 cm2. The LVEF was 60-65% with no significant MR. She underwent cath on 09/26/2016 showing no coronary disease. She was seen by Dr. Cooper on 10/19/2016 for consideration of valve in valve TAVR. She says  that she has no shortness of breath or fatigue, no chest pain, no dizziness or syncope but is not very active at this time due to her hip arthritis.   She is still independent and living alone in her house. She does not drive but has a granddaughter that helps her get around. She is walking with a walker to prevent a fall with her bad left hip.  Past Medical History:  Diagnosis Date  . Aortic stenosis   . Arthritis   . Atrial fibrillation (HCC)    Status post cardioversion November 2013  . CHF (congestive heart failure) (HCC)   . Dysrhythmia   . Hypertension   . Shortness of breath    hx dyspnea and respiratory abnormalities    Past Surgical History:  Procedure Laterality Date  . AORTIC VALVE REPLACEMENT  10/12/2011   Procedure: AORTIC VALVE REPLACEMENT (AVR);  Surgeon: Loudon Krakow K Xerxes Agrusa, MD;  Location: [facility] OR;  Service: Open Heart Surgery;  Laterality: N/A;  . CARDIAC CATHETERIZATION    . CARDIOVERSION  12/26/2011   Procedure: CARDIOVERSION;  Surgeon: Jagadeesh R Ganji, MD;  Location: [facility] ENDOS[facility];  Service: Cardiovascular;  Laterality: N/A;  . CORONARY ANGIOGRAPHY N/A 09/26/2016   Procedure: CORONARY ANGIOGRAPHY;  Surgeon: Patwardhan, Manish J, MD;  Location: [facility] INVASIVE CV LAB;  Service: Cardiovascular;  Laterality: N/A;  . EXPLORATION POST OPERATIVE OPEN HEART  10/12/2011   Procedure: EXPLORATION POST OPERATIVE OPEN HEART;  Surgeon: Bassheva Flury K Marlo Arriola, MD;    Location: [facility] OR;  Service: Open Heart Surgery;  Laterality: N/A;  . LEFT AND RIGHT HEART CATHETERIZATION WITH CORONARY ANGIOGRAM N/A 09/11/2011   Procedure: LEFT AND RIGHT HEART CATHETERIZATION WITH CORONARY ANGIOGRAM;  Surgeon: Jagadeesh R Ganji, MD;  Location: [facility] CATH [facility];  Service: Cardiovascular;  Laterality: N/A;  . MITRAL VALVE REPAIR  10/12/2011   Procedure: MITRAL VALVE REPAIR (MVR);  Surgeon: Esvin Hnat K Haden Cavenaugh, MD;  Location: [facility];  Service: Open Heart Surgery;  Laterality: N/A;  mitral valve annuloplasty  . RIGHT HEART CATH N/A  09/26/2016   Procedure: RIGHT HEART CATH;  Surgeon: Patwardhan, Manish J, MD;  Location: [facility];  Service: Cardiovascular;  Laterality: N/A;  . TEE WITHOUT CARDIOVERSION N/A 08/22/2016   Procedure: TRANSESOPHAGEAL ECHOCARDIOGRAM (TEE);  Surgeon: Ganji, Jay, MD;  Location: [facility];  Service: Cardiovascular;  Laterality: N/A;  . TOTAL HIP ARTHROPLASTY Right 06/15/2016   Procedure: RIGHT TOTAL HIP ARTHROPLASTY ANTERIOR APPROACH;  Surgeon: Brian Swinteck, MD;  Location: [facility]S;  Service: Orthopedics;  Laterality: Right;  Dr. needs RNFA    Family History  Problem Relation Age of Onset  . Hypertension Mother     Social History   Social History  . Marital status: Single    Spouse name: N/A  . Number of children: N/A  . Years of education: N/A   Occupational History  . Not on file.   Social History Main Topics  . Smoking status: Never Smoker  . Smokeless tobacco: Never Used  . Alcohol use No  . Drug use: No  . Sexual activity: Not on file   Other Topics Concern  . Not on file   Social History Narrative  . No narrative on file    Current Outpatient Prescriptions  Medication Sig Dispense Refill  . amLODipine (NORVASC) 10 MG tablet Take 10 mg by mouth daily.    . aspirin 81 MG chewable tablet Chew 1 tablet (81 mg total) by mouth 2 (two) times daily. 60 tablet 1  . Multiple Vitamin (MULTIVITAMIN WITH MINERALS) TABS tablet Take 1 tablet by mouth daily.    . naproxen sodium (ANAPROX) 220 MG tablet Take 440 mg by mouth 2 (two) times daily as needed (for pain.).    . spironolactone (ALDACTONE) 25 MG tablet Take 25 mg by mouth daily.    . traMADol (ULTRAM) 50 MG tablet Take 1 tablet by mouth every 6 (six) hours as needed. pain    . vitamin C (ASCORBIC ACID) 500 MG tablet Take 500 mg by mouth daily.     No current facility-administered medications for this visit.     No Known Allergies    Review of Systems:   General:  normal appetite, normal energy, no weight gain,  no weight loss, no fever  Cardiac:  no chest pain with exertion, no chest pain at rest, no SOB with  exertion, no resting SOB, no PND, no orthopnea, no palpitations, no arrhythmia, no atrial fibrillation, no LE edema, no dizzy spells, no syncope  Respiratory:  no shortness of breath, no home oxygen, no productive cough, no dry cough, no bronchitis, no wheezing, no hemoptysis, no asthma, no pain with inspiration or cough, no sleep apnea, no CPAP at night  GI:   no difficulty swallowing, no reflux, no frequent heartburn, no hiatal hernia, no abdominal pain, no constipation, no diarrhea, no hematochezia, no hematemesis, no melena  GU:   no dysuria,  no frequency, no urinary tract infection, no hematuria,  no kidney stones, no   kidney disease  Vascular:  no pain suggestive of claudication, no pain in feet, no leg cramps, no varicose veins, no DVT, no non-healing foot ulcer  Neuro:   no stroke, no TIA's, no seizures, no headaches, no temporary blindness one eye,  no slurred speech, no peripheral neuropathy, no chronic pain, has instability of gait, no memory/cognitive dysfunction  Musculoskeletal: has arthritis, no joint swelling, no myalgias, has difficulty walking, reduced mobility   Skin:   no rash, no itching, no skin infections, no pressure sores or ulcerations  Psych:   no anxiety, no depression, no nervousness, no unusual recent stress  Eyes:   no blurry vision, no floaters, no recent vision changes, no wears glasses or contacts  ENT:   no hearing loss, no loose or painful teeth, no dentures, last saw dentist this year  Hematologic:  no easy bruising, no abnormal bleeding, no clotting disorder, no frequent epistaxis  Endocrine:  no diabetes, does not check CBG's at home      Physical Exam:   BP (!) 110/55 (BP Location: Left Arm, Patient Position: Sitting, Cuff Size: Large)   Pulse (!) 49   Resp 16   Ht 5' 4.5" (1.638 m)   Wt 132 lb (59.9 kg)   SpO2 96% Comment: ON RA  BMI 22.31 kg/m    General:  Elderly but  well-appearing, no distress  HEENT:  Unremarkable, NCAT, PERLA, EOMI, oropharynx clear  Neck:   no JVD, no bruits, no adenopathy or thyromegaly  Chest:   clear to auscultation, symmetrical breath sounds, no wheezes, no rhonchi, old sternotomy scar.  CV:   RRR, grade III/VI crescendo/decrescendo murmur heard best at RSB,  no diastolic murmur  Abdomen:  soft, non-tender, no masses or organomeagly  Extremities:  warm, well-perfused, pulses palpable in feet, no LE edema  Rectal/GU  Deferred  Neuro:   Grossly non-focal and symmetrical throughout  Skin:   Clean and dry, no rashes, no breakdown   Diagnostic Tests:         *[place]*                   *[facility]*                         [address]                        [place], [place] [address]                            [phone]  ------------------------------------------------------------------- Transthoracic Echocardiography  Patient:    Novell, Zamya R MR #:       [id number] Study Date: 06/17/2016 Gender:     F Age:        75 Height:     162.6 cm Weight:     62.3 kg BSA:        1.68 m^2 Pt. Status: Room:       1231   REFERRING    Jay Ganji, MD  SONOGRAPHER  Johanna Elliott  ORDERING     Ganji, Jagadeesh R  PERFORMING   [facility], Inpatient  ADMITTING    Swinteck, Brian [id number]  ATTENDING    Swinteck, Brian [id number]  cc:  ------------------------------------------------------------------- LV EF: 60% -   65%  ------------------------------------------------------------------- Indications:      Abnormal EKG 794.31.  ------------------------------------------------------------------- History:   PMH:    aortic valve replacement and mitral valve ring repair on 10/12/2011. Bradycardia.  Congestive heart failure.  Risk factors:  Hypertension.  ------------------------------------------------------------------- Study Conclusions  - Left ventricle: The cavity size was  normal. Wall thickness was   increased in a pattern of mild LVH. Systolic function was normal.   The estimated ejection fraction was in the range of 60% to 65%.   Wall motion was normal; there were no regional wall motion   abnormalities. - Aortic valve: There was a bioprosthetic aortic valve. The valve   was poorly visualized. There was turbulence across the valve and   severely elevated mean gradient. Mean gradient (S): 41 mm Hg.   Peak gradient (S): 80 mm Hg. - Mitral valve: Status post mitral valve repair. No significant   stenosis or regurgitation. Mean gradient (D): 4 mm Hg. Valve area   by pressure half-time: 2.65 cm^2. - Right ventricle: The cavity size was normal. Systolic function   was mildly reduced. - Tricuspid valve: Peak RV-RA gradient (S): 44 mm Hg. - Pulmonary arteries: PA peak pressure: 47 mm Hg (S). - Inferior vena cava: The vessel was normal in size. The   respirophasic diameter changes were in the normal range (>= 50%),   consistent with normal central venous pressure.  Impressions:  - Normal LV size and systolic function with EF 60-65%. Normal RV   size with mildly decreased systolic function. S/p mitral valve   repair, no significant stenosis or regurgitation. 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 Would consider TEE, ?pannus versus thrombus. Mild   pulmonary hypertension.  ------------------------------------------------------------------- Study data:  Comparison was made to the study of 09/04/2011.  Study status:  Routine.  Procedure:  Transthoracic echocardiography. Image quality was adequate.          Transthoracic echocardiography.  M-mode, complete 2D, spectral Doppler, and color Doppler.  Birthdate:  Patient birthdate: 08/07/1940.  Age:  Patient is 75 yr old.  Sex:  Gender: female.    BMI: 23.6 kg/m^2.  Blood pressure:     152/90  Patient status:  Inpatient.  Study date: Study date: 06/17/2016.  Study time: 03:36 PM.  Location:  Bedside.   -------------------------------------------------------------------  ------------------------------------------------------------------- Left ventricle:  The cavity size was normal. Wall thickness was increased in a pattern of mild LVH. Systolic function was normal. The estimated ejection fraction was in the range of 60% to 65%. Wall motion was normal; there were no regional wall motion abnormalities.  ------------------------------------------------------------------- Aortic valve:  There was a bioprosthetic aortic valve. The valve was poorly visualized. There was turbulence across the valve and severely elevated mean gradient.  Doppler:     VTI ratio of LVOT to aortic valve: 0.27. Peak velocity ratio of LVOT to aortic valve: 0.23. Mean velocity ratio of LVOT to aortic valve: 0.27.    Mean gradient (S): 41 mm Hg. Peak gradient (S): 80 mm Hg.  ------------------------------------------------------------------- Aorta:  Aortic root: The aortic root was normal in size. Ascending aorta: The ascending aorta was normal in size.  ------------------------------------------------------------------- Mitral valve:  Status post mitral valve repair. No significant stenosis or regurgitation.  Doppler:     Valve area by pressure half-time: 2.65 cm^2. Indexed valve area by pressure half-time: 1.57 cm^2/m^2.    Mean gradient (D): 4 mm Hg. Peak gradient (D): 7 mm Hg.  ------------------------------------------------------------------- Left atrium:  The atrium was normal in size.  ------------------------------------------------------------------- Right ventricle:  The cavity size was normal. Systolic function was   mildly reduced.  ------------------------------------------------------------------- Pulmonic valve:    Structurally normal valve.   Cusp separation was normal.  Doppler:  Transvalvular velocity was within the normal range. There was  trivial regurgitation.  ------------------------------------------------------------------- Tricuspid valve:   Doppler:  There was mild regurgitation.  ------------------------------------------------------------------- Right atrium:  The atrium was normal in size.  ------------------------------------------------------------------- Pericardium:  There was no pericardial effusion.  ------------------------------------------------------------------- Systemic veins: Inferior vena cava: The vessel was normal in size. The respirophasic diameter changes were in the normal range (>= 50%), consistent with normal central venous pressure.  ------------------------------------------------------------------- Measurements   Left ventricle                         Value          Reference  LV ID, ED, PLAX chordal        (L)     37.4  mm       43 - 52  LV ID, ES, PLAX chordal        (L)     20.6  mm       23 - 38  LV fx shortening, PLAX chordal         45    %        >=29  LV PW thickness, ED                    10.3  mm       ----------  IVS/LV PW ratio, ED                    0.86           <=1.3  LV e&', lateral                         6     cm/s     ----------  LV E/e&', lateral                       22.67          ----------  LV e&', medial                          3.57  cm/s     ----------  LV E/e&', medial                        38.1           ----------  LV e&', average                         4.79  cm/s     ----------  LV E/e&', average                       28.42          ----------    Ventricular septum                     Value          Reference  IVS thickness, ED                      8.83  mm       ----------    LVOT                                     Value          Reference  LVOT peak velocity, S                  99.4  cm/s     ----------  LVOT mean velocity, S                  75.1  cm/s     ----------  LVOT VTI, S                            22    cm       ----------     Aortic valve                           Value          Reference  Aortic valve peak velocity, S          434   cm/s     ----------  Aortic valve mean velocity, S          282   cm/s     ----------  Aortic valve VTI, S                    82.5  cm       ----------  Aortic mean gradient, S                39    mm Hg    ----------  Aortic peak gradient, S                75    mm Hg    ----------  VTI ratio, LVOT/AV                     0.27           ----------  Velocity ratio, peak, LVOT/AV          0.23           ----------  Velocity ratio, mean, LVOT/AV          0.27           ----------    Aorta                                  Value          Reference  Aortic root ID, ED                     31    mm       ----------    Left atrium                            Value          Reference  LA ID, A-P, ES                         38    mm       ----------  LA ID/bsa, A-P                 (H)     2.26  cm/m^2   <=2.2  LA volume, S                             34.4  ml       ----------  LA volume/bsa, S                       20.4  ml/m^2   ----------  LA volume, ES, 1-p A4C                 28.9  ml       ----------  LA volume/bsa, ES, 1-p A4C             17.2  ml/m^2   ----------  LA volume, ES, 1-p A2C                 39.7  ml       ----------  LA volume/bsa, ES, 1-p A2C             23.6  ml/m^2   ----------    Mitral valve                           Value          Reference  Mitral E-wave peak velocity            136   cm/s     ----------  Mitral A-wave peak velocity            117   cm/s     ----------  Mitral mean velocity, D                91.5  cm/s     ----------  Mitral deceleration time       (H)     254   ms       150 - 230  Mitral pressure half-time              83    ms       ----------  Mitral mean gradient, D                4     mm Hg    ----------  Mitral peak gradient, D                7     mm Hg    ----------  Mitral E/A ratio, peak                 1.2            ----------  Mitral  valve area, PHT, DP             2.65  cm^2     ----------  Mitral valve area/bsa, PHT, DP         1.57  cm^2/m^2 ----------  Mitral annulus VTI, D                  40.3  cm       ----------    Pulmonary arteries                     Value          Reference  PA pressure, S, DP             (H)     47    mm Hg    <=30    Tricuspid valve                          Value          Reference  Tricuspid regurg peak velocity         331   cm/s     ----------  Tricuspid peak RV-RA gradient          44    mm Hg    ----------    Right atrium                           Value          Reference  RA ID, S-I, ES, A4C            (H)     51    mm       34 - 49  RA area, ES, A4C                       13.5  cm^2     8.3 - 19.5  RA volume, ES, A/L                     30.3  ml       ----------  RA volume/bsa, ES, A/L                 18    ml/m^2   ----------    Systemic veins                         Value          Reference  Estimated CVP                          3     mm Hg    ----------    Right ventricle                        Value          Reference  TAPSE                                  16.1  mm       ----------  Legend: (L)  and  (H)  mark values outside specified reference range.  ------------------------------------------------------------------- Prepared and Electronically Authenticated by  Dalton McLean, M.D. 2018-04-28T16:51:26         *[place]*                   *[facility]*                         [address]                        [place], [place] [address]                            [phone]  ------------------------------------------------------------------- Transesophageal Echocardiography  (Report amended )  Patient:    Christiana, Mohogany R MR #:       [id number] Study Date: 08/22/2016 Gender:     F Age:        75 Height:     162.6 cm Weight:     62.3 kg BSA:          1.68 m^2 Pt. Status: Room:   ATTENDING    Jay Ganji, MD  PERFORMING   Jay  Ganji, MD  REFERRING    Jay Ganji, MD  ADMITTING    Ganji, Jagadeesh R  ORDERING     Ganji, Jagadeesh R  SONOGRAPHER  Tina West  cc:  ------------------------------------------------------------------- LV EF: 60% -   65%  ------------------------------------------------------------------- Indications:      Aortic stenosis 424.1.  ------------------------------------------------------------------- History:   PMH:  Bradycardia. Heart block.  Dyspnea.  Mitral valve disease.  Risk factors:  Hypertension.  ------------------------------------------------------------------- Study Conclusions  - Left ventricle: There was mild concentric hypertrophy. Systolic   function was normal. The estimated ejection fraction was in the   range of 60% to 65%. - Aortic valve: Very difficult to visualize the leaflets. Severe   bioprosthetic aortic valve stenosis. Peak gradient 63, mean   gradient 33 mmHg, calculated aortic valve area 0.64 cm^2 and 0.70   cm^2 by V-max. Mildly calcified annulus. A bioprosthesis was   present and functioning abnormally. 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 Valve mobility was severely restricted. Valve area   (VTI): 0.58 cm^2. Valve area (Vmax): 0.7 cm^2. Valve area   (Vmean): 0.6 cm^2. - Mitral valve: An annular ring prosthesis was present and   functioning normally. The prosthesis had a normal range of   motion. - Left atrium: The atrium was dilated. No evidence of thrombus in   the atrial cavity or appendage. - Right atrium: No evidence of thrombus in the atrial cavity or   appendage. - Impressions: The right ventricular systolic pressure was   increased consistent with moderate pulmonary hypertension. PASP   45-50 mm Hg.  Impressions:  - The right ventricular systolic pressure was increased consistent   with moderate pulmonary hypertension. PASP 45-50 mm Hg.  ------------------------------------------------------------------- Study data:   Study  status:  Routine.  Consent:  The risks, benefits, and alternatives to the procedure were explained to the patient and informed consent was obtained.  Procedure:  Initial setup. The patient was brought to the laboratory. Surface ECG leads were monitored. Sedation. Conscious sedation was administered by cardiology staff. Transesophageal echocardiography. An adult multiplane transesophageal probe was inserted by the attending cardiologistwithout difficulty. Image quality was adequate.  Study completion:  The patient tolerated the procedure well. There were no complications.  Administered medications:   Fentanyl, 12.5mcg, IV.  Midazolam, 3mg, IV.          Diagnostic transesophageal echocardiography.  2D and color Doppler.  Birthdate:  Patient birthdate: 09/22/1940.  Age:  Patient is 75 yr old.  Sex:  Gender: female.    BMI: 23.6 kg/m^2.  Blood pressure:     158/116  Patient status:  Inpatient.  Study date:  Study date: 08/22/2016. Study time: 11:44 AM.  Location:  Endoscopy.  -------------------------------------------------------------------  ------------------------------------------------------------------- Left ventricle:  There was mild concentric hypertrophy. Systolic function was normal. The estimated ejection fraction was in the range of 60% to 65%.  ------------------------------------------------------------------- Aortic valve:  Very difficult to visualize the leaflets. Severe bioprosthetic aortic valve stenosis. Peak gradient 63, mean gradient 33 mmHg, calculated aortic valve area 0.64 cm^2 and 0.70 cm^2 by V-max.  Mildly calcified annulus. A bioprosthesis was present and functioning abnormally. There was severe pannus formation. Valve mobility was severely restricted.  Doppler: There was severe stenosis.      VTI ratio of LVOT to aortic valve: 0.2. Valve area (VTI): 0.58 cm^2. Indexed valve area (VTI): 0.34 cm^2/m^2. Peak velocity ratio of LVOT   to aortic valve: 0.25.  Valve area (Vmax): 0.7 cm^2. Indexed valve area (Vmax): 0.42 cm^2/m^2. Mean velocity ratio of LVOT to aortic valve: 0.21. Valve area (Vmean): 0.6 cm^2. Indexed valve area (Vmean): 0.36 cm^2/m^2. Mean gradient (S): 30 mm Hg. Peak gradient (S): 63 mm Hg.  ------------------------------------------------------------------- Aorta:  The aorta was normal, not dilated, and non-diseased.  ------------------------------------------------------------------- Mitral valve:  An annular ring prosthesis was present and functioning normally. The prosthesis had a normal range of motion. Mobility was not restricted.  Doppler:  Transvalvular velocity was within the normal range. There was no significant regurgitation.   ------------------------------------------------------------------- Left atrium:  The atrium was dilated.  No evidence of thrombus in the atrial cavity or appendage. The appendage was morphologically a left appendage. Emptying velocity was normal.  ------------------------------------------------------------------- Atrial septum:  Well visualized.  Doppler showed no shunt.  ------------------------------------------------------------------- Right ventricle:  The cavity size was normal. Wall thickness was normal. Systolic function was normal.  ------------------------------------------------------------------- Pulmonic valve:    Structurally normal valve.   Cusp separation was normal.  No evidence of vegetation.  Doppler:  There was trivial regurgitation.  ------------------------------------------------------------------- Tricuspid valve:   Structurally normal valve.   Leaflet separation was normal.  Doppler:  There was mild regurgitation.  ------------------------------------------------------------------- Right atrium:  The atrium was normal in size.  No evidence of thrombus in the atrial cavity or  appendage.  ------------------------------------------------------------------- Pericardium:  The pericardium was normal in appearance. There was no pericardial effusion.  ------------------------------------------------------------------- Post procedure conclusions Ascending Aorta:  - The aorta was normal, not dilated, and non-diseased.  ------------------------------------------------------------------- Measurements   Left ventricle                           Value  Stroke volume, 2D                        50    ml  Stroke volume/bsa, 2D                    30    ml/m^2    LVOT                                     Value  LVOT ID, S                               19    mm  LVOT area                                2.84  cm^2  LVOT peak velocity, S                    98.2  cm/s  LVOT mean velocity, S                    52.5  cm/s  LVOT VTI, S                              17.6  cm  LVOT peak gradient, S                    4     mm Hg      Aortic valve                             Value  Aortic valve peak velocity, S            398   cm/s  Aortic valve mean velocity, S            249   cm/s  Aortic valve VTI, S                      86.7  cm  Aortic mean gradient, S                  30    mm Hg  Aortic peak gradient, S                  63    mm Hg  VTI ratio, LVOT/AV                       0.2  Aortic valve area, VTI                   0.58  cm^2  Aortic valve area/bsa, VTI               0.34  cm^2/m^2  Velocity ratio, peak, LVOT/AV            0.25  Aortic valve area, peak velocity         0.7   cm^2  Aortic valve area/bsa, peak velocity     0.42  cm^2/m^2  Velocity ratio, mean, LVOT/AV            0.21  Aortic valve area, mean velocity         0.6   cm^2  Aortic valve area/bsa, mean velocity     0.36  cm^2/m^2  Legend: (L)  and  (H)  mark values outside specified reference range.  ------------------------------------------------------------------- Amended  Jay Ganji,  MD 2018-07-07T10:13:25   Procedures   CORONARY ANGIOGRAPHY  RIGHT HEART CATH  Conclusion   Angiographically no significant coronary artery disease Normal filling pressures  Procedural Details/Technique   Technical Details Right heart catheterization and selective right and left coronary angiography was performed with continuous hemodynamic monitoring  Indication: Preop evaluation for noncardiac surgery, severe aortic stenosis.  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 Given her severe aortic stenosis, she was referred for coronary angiography and right heart catheterization as pre-op evaluation.  Light sedation given using 0.5 mg Versed.  Technique: Under sterile conditions 22-gauge right before meals venous access was exchanged for a 5 French venous sheath. Right radial access for his obtained. Cocktail of 1.5 mg verapamil and 100 g of nitroglycerin glycerin was occlusive.  Right heart catheterization was performed using a balloon tipped 5 French wedge catheter. We used a Glidewire to assist advancing the catheter. Before stopping the pulmonary artery pressures for by pulmonary capillary wedge pressures and pulmonary artery sat duration. In the obtained right ventricular and right atrial pressures.  Left heart catheterization was performed using 5 French TIG 4.0 catheter for left coronary artery and a 5 French JL4 catheter for right coronary artery. Coronary angiogram demonstrated normal coronary artery vessels with   no angiographically significant coronary artery disease.   Estimated blood loss <50 mL.  During this procedure the patient was administered the following to achieve and maintain moderate conscious  sedation: Versed 0.5 mg, while the patient's heart rate, blood pressure, and oxygen saturation were continuously monitored. The period of conscious sedation was 44 minutes, of which I was present face-to-face 100% of this time.    Complications   Complications documented before study signed (09/27/2016 6:13 AM EDT)    RIGHT/LEFT HEART CATH AND CORONARY ANGIOGRAPHY   None Documented by Patwardhan, Manish J, MD 09/26/2016 5:50 PM EDT  Time Range: Intra-procedure      Coronary Findings   Dominance: Right  Left Main  Vessel was injected. Vessel is normal in caliber. Vessel is angiographically normal.  Left Anterior Descending  Vessel was injected. Vessel is normal in caliber. Vessel is angiographically normal.  Ramus Intermedius  Vessel was injected. Vessel is normal in caliber. Vessel is angiographically normal.  Left Circumflex  Vessel was injected. Vessel is normal in caliber. Vessel is angiographically normal.  Right Coronary Artery  Vessel was injected. Vessel is normal in caliber. Vessel is angiographically normal.  Right Heart   Right Heart Pressures RA mean pressure 1 mmHg, A wave 6 mmHg, V wave 3 mmHg RV pressure 43/1 mmHg with RVEDP 3 mmHg PA pressure 37/9 mmHg with mean pressure 19 mmHg PCWP 9 mmHg, A wave 10 mmHg, V wave 14 mmHg    Coronary Diagrams   Diagnostic Diagram       Implants     No implant documentation for this case.  PACS Images   Show images for Cardiac catheterization   Link to Procedure Log   Procedure Log    Hemo Data    Most Recent Value  Fick Cardiac Output 3.99 L/min  Fick Cardiac Output Index 2.42 (L/min)/BSA  RA A Wave 6 mmHg  RA V Wave 3 mmHg  RA Mean 1 mmHg  RV Systolic Pressure 43 mmHg  RV Diastolic Pressure -1 mmHg  RV EDP 3 mmHg  PA Systolic Pressure 37 mmHg  PA Diastolic Pressure 9 mmHg  PA Mean 19 mmHg  PW A Wave 10 mmHg  PW V Wave 14 mmHg  PW Mean 9 mmHg  AO Systolic Pressure 172 mmHg  AO Diastolic Pressure 78 mmHg    AO Mean 111 mmHg  QP/QS 1  TPVR Index 7.86 HRUI  TSVR Index 45.92 HRUI  PVR SVR Ratio 0.09  TPVR/TSVR Ratio 0.17   CT CORONARY MORPH W/CTA COR W/SCORE W/CA W/CM &/OR WO/CM (Accession [id number]) (Order [id number])  Imaging  Date: 11/09/2016 Department:  [facility] CT IMAGING Released By: Baskerville, Wanda K Authorizing: Cooper, Michael, MD  Exam Information   Status Exam Begun  Exam Ended   Final [99] 11/09/2016 3:24 PM 11/09/2016 3:56 PM  PACS Images   Show images for CT CORONARY MORPH W/CTA COR W/SCORE W/CA W/CM &/OR WO/CM  Addendum   ADDENDUM REPORT: 11/13/2016 10:34  EXAM: OVER-READ INTERPRETATION  CT CHEST  The following report is an over-read performed by radiologist Dr. Daniel Entrikinof [facility] on 11/13/2016. This over-read does not include interpretation of cardiac or coronary anatomy or pathology. The cardiac CTA interpretation by the cardiologist is attached.  COMPARISON:  None.  FINDINGS: Extracardiac findings will be described under separate dictation for contemporaneously obtained CTA of the chest, abdomen and pelvis.  IMPRESSION: Please see separate dictation for contemporaneously obtained CTA of the chest, abdomen and pelvis dated 11/09/2016   for full description of relevant extracardiac findings.   Electronically Signed   By: Daniel  Entrikin M.D.   On: 11/13/2016 10:34   Addended by Entrikin, Daniel W, MD on 11/13/2016 10:36 AM    Study Result   CLINICAL DATA:  Aortic Stenosis  EXAM: Cardiac TAVR CT  TECHNIQUE: The patient was scanned on a Siemens Force 192 slice scanner. A 120 kV retrospective scan was triggered in the ascending thoracic aorta at 140 HU's. Gantry rotation speed was 250 msecs and collimation was .6 mm. No beta blockade or nitro were given. The 3D data set was reconstructed in 5% intervals of the R-R cycle. Systolic and diastolic phases were analyzed on a dedicated work station  using MPR, MIP and VRT modes. The patient received 80 cc of contrast.  FINDINGS: Aortic Valve: The is a 21 mm Edwards stented bovine pericardial Magna Ease bioprosthetic valve There is no stent creep with post to post diameter about 18 mm. The valve appears small compared to the coronary sinuses The leaflets only thicken near the base. There is no appreciable calcification. There does appear to be commissural fusion near the base especially between the right leaflet. The base of the leaflets are thickened and hypodense but this more likely represents artifact from the stents as opposed to subclinical thrombus. There is no appreciable pannus seen.  Aorta:  27.5 mm  Sino-tubular Junction:  26 mm  Ascending Thoracic Aorta:  27.5 mm  Aortic Arch:  25 mm  Descending Thoracic Aorta:  Tortuous 23 mm  Sinus of Valsalva Measurements:  Non-coronary:  30.9 mm  Right - coronary:  29.8 mm  Left -   coronary:  31.5 mm  Coronary Artery Height above Annulus:  Left Main:  11.3 mm above annulus  Right Coronary:  16 mm above annulus  Virtual Basal Annulus Measurements:  Maximum / Minimum Diameter: Inner diameter of bioprosthetic valve 21 mm  Perimeter:  68.9 mm  Area:  377 mm2  IMPRESSION: 1) 21 mm Magna Ease bioprosthetic valve. Planimetered area 1.5 cm2. Some thickening of the basal leaflets and commissural fusion but no obvious subclinical thrombus or pannus  2) Suitable coronary height for valve in valve procedure  3) Normal Internal Diameter ID 21 mm for this valve  4) Normal aortic root 27.5 mm  5) Relatively large coronary sinuses for implanted valve  Peter Nishan  Electronically Signed: By: Peter  Nishan M.D. On: 11/09/2016 18:37       CT Angio Abd/Pel w/ and/or w/o (Accession [id number]) (Order [id number])  Imaging  Date: 11/09/2016 Department: [facility] CT IMAGING Released By: Oakes, Brittany L Authorizing:  Cooper, Michael, MD  Exam Information   Status Exam Begun  Exam Ended   Final [99] 11/09/2016 3:24 PM 11/09/2016 3:56 PM  PACS Images   Show images for CT Angio Abd/Pel w/ and/or w/o  Study Result   CLINICAL DATA:  76-year-old female with history of severe aortic stenosis. Preprocedural study prior to potential transcatheter aortic valve replacement (TAVR) procedure.  EXAM: CT ANGIOGRAPHY CHEST, ABDOMEN AND PELVIS  TECHNIQUE: Multidetector CT imaging through the chest, abdomen and pelvis was performed using the standard protocol during bolus administration of intravenous contrast. Multiplanar reconstructed images and MIPs were obtained and reviewed to evaluate the vascular anatomy.  CONTRAST:  100 mL of Isovue 370.  COMPARISON:  CT of the abdomen and pelvis 12/31/2002.  FINDINGS: CTA CHEST FINDINGS  Cardiovascular: Heart size is enlarged. There is no significant pericardial fluid, thickening or   pericardial calcification. Aortic atherosclerosis (mild), without evidence of aneurysm or dissection. Status post median sternotomy for aortic valve replacement with a stented bioprosthesis. Status post mitral annuloplasty.  Mediastinum/Lymph Nodes: No pathologically enlarged mediastinal or hilar lymph nodes. Esophagus is unremarkable in appearance. No axillary lymphadenopathy.  Lungs/Pleura: No suspicious appearing pulmonary nodules or masses. No acute consolidative airspace disease. No pleural effusions.  Musculoskeletal/Soft Tissues: Median sternotomy wires. There are no aggressive appearing lytic or blastic lesions noted in the visualized portions of the skeleton.  CTA ABDOMEN AND PELVIS FINDINGS  Hepatobiliary: 12 mm low-attenuation lesion between segments 6 and 7 in the liver is only slightly larger than remote prior study from 2004, most compatible with a small cyst. No suspicious hepatic lesions. No intra or extrahepatic biliary ductal  dilatation. Gallbladder is normal in appearance.  Pancreas: No pancreatic mass. No pancreatic ductal dilatation. No pancreatic or peripancreatic fluid or inflammatory changes.  Spleen: Unremarkable.  Adrenals/Urinary Tract: Multiple well-defined low-attenuation lesions in both kidneys, compatible with simple cysts, largest of which measures up to 2.7 cm in the upper pole the left kidney. In addition, there are several subcentimeter low-attenuation lesions in both kidneys, too small to definitively characterize, but favored to represent tiny cysts. Bilateral adrenal glands are normal in appearance. No hydroureteronephrosis. Urinary bladder is partially obscured by beam hardening artifact from the patient's right hip arthroplasty, but is unremarkable in appearance.  Stomach/Bowel: Normal appearance of the stomach. No pathologic dilatation of small bowel or colon. The appendix is not confidently identified and may be surgically absent. Regardless, there are no inflammatory changes noted adjacent to the cecum to suggest the presence of an acute appendicitis at this time.  Vascular/Lymphatic: Aortic atherosclerosis, without evidence of aneurysm or dissection in the abdominal or pelvic vasculature. Vascular findings and measurements pertinent to potential TAVR procedure, as detailed below. Celiac axis, superior mesenteric artery and inferior mesenteric artery are all widely patent without hemodynamically significant stenosis. Single renal artery is bilaterally appear widely patent. No lymphadenopathy noted in the abdomen or pelvis.  Reproductive: Coarse calcification in the uterine fundus, presumably a calcified fibroid. Ovaries are atrophic.  Other: No significant volume of ascites.  No pneumoperitoneum.  Musculoskeletal: Status post right hip arthroplasty. Advanced joint space narrowing, subchondral sclerosis, subchondral cyst formation and osteophyte formation in the left  hip joint, compatible with severe osteoarthritis. Left-sided protrusio acetabulae. There are no aggressive appearing lytic or blastic lesions noted in the visualized portions of the skeleton.  VASCULAR MEASUREMENTS PERTINENT TO TAVR:  AORTA:  Minimal Aortic Diameter -  20 x 18 mm  Severity of Aortic Calcification -  mild  RIGHT PELVIS:  Right Common Iliac Artery -  Minimal Diameter - 12.2 x 11.9 mm  Tortuosity - moderate  Calcification - mild  Right External Iliac Artery -  Minimal Diameter - 8.2 x 7.8 mm  Tortuosity - moderate  Calcification - none  Right Common Femoral Artery -  Minimal Diameter - 7.8 x 7.8 mm  Tortuosity - mild  Calcification - none  LEFT PELVIS:  Left Common Iliac Artery -  Minimal Diameter - 12.8 x 11.6 mm  Tortuosity - moderate  Calcification - mild  Left External Iliac Artery -  Minimal Diameter - 9.0 x 8.0 mm  Tortuosity - moderate to severe  Calcification - none  Left Common Femoral Artery -  Minimal Diameter - 7.3 x 8.4 mm  Tortuosity - mild  Calcification - none  Review of the MIP images confirms the above findings.  IMPRESSION:   1. Vascular findings and measurements pertinent to potential TAVR procedure, as detailed above. This patient does have suitable pelvic arterial access bilaterally. 2. Status post aortic valve replacement with a stented aortic bioprosthesis. 3. Cardiomegaly. 4. Aortic atherosclerosis. 5. Additional incidental findings, as above. Aortic Atherosclerosis (ICD10-I70.0).   Electronically Signed   By: Daniel  Entrikin M.D.   On: 11/13/2016 12:12     Impression:  This 76 year old woman has stage C1, severe, asymptomatic bioprosthetic aortic valve stenosis. She has severe hip DJD and is not very active at this time which may be preventing symptoms. She requires a left hip replacement. She did have a right hip replacement in April and had periop  hypotension which could have been caused by her AS and anemia. I have personally reviewed her echo, cath and CTA studies. Her prosthetic valve appears to have one leaflet that is not moving and the mean gradient is 41 mm Hg by 2D echo and 33 mm Hg by TEE. This is clearly higher than it should be and in the severe stenosis range. If she did not require further surgery it would be acceptable to continue observation since she is asymptomatic but since she does require more surgery it is probably best to address this problem. It will most likely get worse with time and her lifespan may be fairly long since she has no coronary disease and her mother lived to be 105. I think valve in valve TAVR is probably the best option at this time and would allow her to more safely undergo left hip replacement. She has a 21 mm Magna Ease valve that is suitable for TAVR using a 23 mm Medtronic Evolut valve. Her abdominal and pelvic CT shows arterial anatomy suitable for a transfemoral insertion.   The patient was counseled at length regarding treatment alternatives for management of severe prosthetic aortic valve stenosis. The risks and benefits of surgical intervention hav been discussed in detail. Long-term prognosis with medical therapy was discussed. Alternative approaches such as conventional surgical aortic valve replacement, transcatheter aortic valve replacement, and palliative medical therapy were compared and contrasted at length. This discussion was placed in the context of the patient's own specific clinical presentation and past medical history. All of their questions been addressed.    Following the decision to proceed with transcatheter aortic valve replacement, a discussion was held regarding what types of management strategies would be attempted intraoperatively in the event of life-threatening complications, including whether or not the patient would be considered a candidate for the use of cardiopulmonary bypass  and/or conversion to open sternotomy for attempted surgical intervention.   The patient has been advised of a variety of complications that might develop including but not limited to risks of death, stroke, paravalvular leak, aortic dissection or other major vascular complications, aortic annulus rupture, device embolization, cardiac rupture or perforation, mitral regurgitation, acute myocardial infarction, arrhythmia, heart block or bradycardia requiring permanent pacemaker placement, congestive heart failure, respiratory failure, renal failure, pneumonia, infection, other late complications related to structural valve deterioration or migration, or other complications that might ultimately cause a temporary or permanent loss of functional independence or other long term morbidity. The patient provides full informed consent for the procedure as described and all questions were answered.     Plan:  The patient will return to see Dr. Owen for a second surgical evaluation and if her agrees we will schedule for valve in valve TAVR using a transfemoral Medtronic Evolut valve.   I spent   60 minutes performing this consultation and > 50% of this time was spent face to face counseling and coordinating the care of this patient's severe prosthetic aortic valve stenosis.    Gabriella Woodhead K. Terren Haberle, MD 11/14/2016    

## 2016-12-12 NOTE — Progress Notes (Signed)
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  BP 105/59, pt asymptomatic.  Dr. Delton SeeNelson to bedside.  1mg  atropine given.  HR sustaining 40s-50s now.  Pt to be admitted.  Roselie AwkwardShannon Genova Kiner, RN

## 2016-12-12 NOTE — H&P (View-Only) (Signed)
[facility]  MULTIDISCIPLINARY HEART VALVE CLINIC  CARDIOTHORACIC SURGERY CONSULTATION REPORT  Referring Provider is Ganji, Jay, MD PCP is Wong, Francis P, MD  Chief Complaint  Patient presents with  . Aortic Stenosis    2nd TAVR eval, review all studies, surgery scheduled for 11/28/16    HPI:  Patient is a 76-year-old African-American female with history of aortic insufficiency, mitral regurgitation, hypertension, and chronic diastolic congestive heart failure who underwent aortic valve replacement using a 21 mm Edwards Magna Ease bovine pericardial tissue valve and mitral valve ring annuloplasty by Dr. Bartle in 2013.  Her immediate postoperative recovery was notable for mediastinal bleeding requiring reexploration. She subsequently developed atrial fibrillation requiring DC cardioversion. Subsequent follow-up echocardiograms performed at Dr.  Ganji's office reportedly demonstrated normalization of left ventricular systolic function with moderately elevated transvalvular gradient across the aortic valve. She was also reported to have chronic paroxysmal complete heart block and junctional rhythm without associated symptoms of symptomatic bradycardia.  She otherwise did well from a cardiac standpoint until April of this year when she underwent elective right total hip replacement for severe degenerative arthritis.  Immediately following surgery she was noted to have paroxysmal junctional escape rhythm and marked sinus bradycardia with first-degree AV block associated with hypotension in the setting of severe acute blood loss anemia with hemoglobin 7.5. She was transfused 2 units of packed red blood cells and blood pressure was supported using dopamine. Transthoracic echocardiogram performed at that time revealed normal left ventricular systolic function with ejection fraction estimated 60-65%. Transvalvular gradient across the aortic valve was elevated with peak velocity reported 4.3  m/s corresponding to mean transvalvular gradient estimated 39 mmHg. She recovered fairly quickly and was discharged home without event.  She has done well from a cardiac standpoint with no further episodes of hypotension, chest discomfort, or shortness of breath. Transesophageal echocardiogram was performed 08/22/2016 and felt to demonstrate the presence of "severe pannus formation". The mean transvalvular gradient was measured 33 mmHg and aortic valve area calculated 0.64 cm, although by planimetry the valve area measured greater than 1.8 cm. The mitral valve was functioning normally with no residual mitral regurgitation.  Diagnostic cardiac catheterization was performed 09/26/2016 and notable for the absence of significant coronary artery disease. Transvalvular gradient across the aortic valve was not assessed. Pulmonary artery pressures were minimally elevated.  The patient was referred to the multidisciplinary heart valve clinic and evaluated by Dr. 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 CT angiography was performed to further characterize the feasibility of transcatheter aortic valve replacement, but cardiac gated CT angiogram of the heart did not reveal findings consistent with significant pannus formation or leaflet restriction. Aortic valve area by planimetry measured greater than 1.5 cm. The patient was referred for surgical consultation and evaluated by Dr. Bartle, and a second surgical consultation has been requested.  The patient is single and lives locally in [place]. She has 2 adult sons who live in [place], 2 sisters who live nearby, and several grandchildren.  The patient states that she has remained fairly active physically and is entirely functionally independent until approximately one year ago when she developed fairly rapid onset of  severe pain in both hips related to degenerative arthritis. Symptoms persisted and progressed until it became very difficult for her to remain ambulatory. After right total hip replacement performed 6 months ago the patient has done   fairly well, although she remains severely limited by pain in her left hip. She ambulates using a walker. She specifically denies any symptoms of exertional shortness of breath or chest discomfort, although she admits that her physical mobility remains limited. She has never had any resting shortness of breath, chest tightness, PND, orthopnea, or lower extremity edema. She denies any dizzy spells, syncope, or palpitations. She states that since her blood pressure medications were changed immediately following her hip replacement last spring she has actually felt quite well. She hopes to get her left hip replaced in the near future.  Past Medical History:  Diagnosis Date  . Aortic stenosis   . Arthritis   . Atrial fibrillation (HCC)    Status post cardioversion November 2013  . CHF (congestive heart failure) (HCC)   . Dysrhythmia   . Hypertension   . Shortness of breath    hx dyspnea and respiratory abnormalities    Past Surgical History:  Procedure Laterality Date  . AORTIC VALVE REPLACEMENT  10/12/2011   Procedure: AORTIC VALVE REPLACEMENT (AVR);  Surgeon: Bryan K Bartle, MD;  Location: [facility];  Service: Open Heart Surgery;  Laterality: N/A;  . CARDIAC CATHETERIZATION    . CARDIOVERSION  12/26/2011   Procedure: CARDIOVERSION;  Surgeon: Jagadeesh R Ganji, MD;  Location: [facility];  Service: Cardiovascular;  Laterality: N/A;  . CORONARY ANGIOGRAPHY N/A 09/26/2016   Procedure: CORONARY ANGIOGRAPHY;  Surgeon: Patwardhan, Manish J, MD;  Location: [facility] INVASIVE CV LAB;  Service: Cardiovascular;  Laterality: N/A;  . EXPLORATION POST OPERATIVE OPEN HEART  10/12/2011   Procedure: EXPLORATION POST OPERATIVE OPEN HEART;  Surgeon: Bryan K Bartle, MD;  Location: [facility] OR;  Service:  Open Heart Surgery;  Laterality: N/A;  . LEFT AND RIGHT HEART CATHETERIZATION WITH CORONARY ANGIOGRAM N/A 09/11/2011   Procedure: LEFT AND RIGHT HEART CATHETERIZATION WITH CORONARY ANGIOGRAM;  Surgeon: Jagadeesh R Ganji, MD;  Location: [facility] CATH [facility];  Service: Cardiovascular;  Laterality: N/A;  . MITRAL VALVE REPAIR  10/12/2011   Procedure: MITRAL VALVE REPAIR (MVR);  Surgeon: Bryan K Bartle, MD;  Location: [facility];  Service: Open Heart Surgery;  Laterality: N/A;  mitral valve annuloplasty  . RIGHT HEART CATH N/A 09/26/2016   Procedure: RIGHT HEART CATH;  Surgeon: Patwardhan, Manish J, MD;  Location: [facility];  Service: Cardiovascular;  Laterality: N/A;  . TEE WITHOUT CARDIOVERSION N/A 08/22/2016   Procedure: TRANSESOPHAGEAL ECHOCARDIOGRAM (TEE);  Surgeon: Ganji, Jay, MD;  Location: [facility];  Service: Cardiovascular;  Laterality: N/A;  . TOTAL HIP ARTHROPLASTY Right 06/15/2016   Procedure: RIGHT TOTAL HIP ARTHROPLASTY ANTERIOR APPROACH;  Surgeon: Brian Swinteck, MD;  Location: [facility];  Service: Orthopedics;  Laterality: Right;  Dr. needs RNFA    Family History  Problem Relation Age of Onset  . Hypertension Mother     Social History   Social History  . Marital status: Single    Spouse name: N/A  . Number of children: N/A  . Years of education: N/A   Occupational History  . Not on file.   Social History Main Topics  . Smoking status: Never Smoker  . Smokeless tobacco: Never Used  . Alcohol use No  . Drug use: No  . Sexual activity: Not on file   Other Topics Concern  . Not on file   Social History Narrative  . No narrative on file    Current Outpatient Prescriptions  Medication Sig Dispense Refill  . amLODipine (NORVASC) 10 MG tablet Take 10 mg   by mouth daily.    . aspirin 81 MG chewable tablet Chew 1 tablet (81 mg total) by mouth 2 (two) times daily. 60 tablet 1  . Multiple Vitamin (MULTIVITAMIN WITH MINERALS) TABS tablet Take 1 tablet by mouth daily.    . naproxen  sodium (ANAPROX) 220 MG tablet Take 440 mg by mouth 2 (two) times daily as needed (for pain.).    . spironolactone (ALDACTONE) 25 MG tablet Take 25 mg by mouth daily.    . traMADol (ULTRAM) 50 MG tablet Take 1 tablet by mouth every 6 (six) hours as needed. pain    . vitamin C (ASCORBIC ACID) 500 MG tablet Take 500 mg by mouth daily.     No current facility-administered medications for this visit.     No Known Allergies    Review of Systems:   General:  normal appetite, normal energy, no weight gain, no weight loss, no fever  Cardiac:  no chest pain with exertion, no chest pain at rest, no SOB with exertion, no resting SOB, no PND, no orthopnea, no palpitations, no arrhythmia, no atrial fibrillation, no LE edema, no dizzy spells, no syncope  Respiratory:  no shortness of breath, no home oxygen, no productive cough, no dry cough, no bronchitis, no wheezing, no hemoptysis, no asthma, no pain with inspiration or cough, no sleep apnea, no CPAP at night  GI:   no difficulty swallowing, no reflux, no frequent heartburn, no hiatal hernia, no abdominal pain, no constipation, no diarrhea, no hematochezia, no hematemesis, no melena  GU:   no dysuria,  no frequency, no urinary tract infection, no hematuria, no kidney stones, no kidney disease  Vascular:  no pain suggestive of claudication, no pain in feet, no leg cramps, no varicose veins, no DVT, no non-healing foot ulcer  Neuro:   no stroke, no TIA's, no seizures, no headaches, no temporary blindness one eye,  no slurred speech, no peripheral neuropathy, + chronic pain, + instability of gait, no memory/cognitive dysfunction  Musculoskeletal: + arthritis, no joint swelling, no myalgias, + difficulty walking, limited mobility   Skin:   no rash, no itching, no skin infections, no pressure sores or ulcerations  Psych:   no anxiety, no depression, no nervousness, no unusual recent stress  Eyes:   no blurry vision, no floaters, no recent vision changes, +  wears glasses or contacts  ENT:   no hearing loss, no loose or painful teeth, no dentures, last saw dentist within the past year  Hematologic:  no easy bruising, no abnormal bleeding, no clotting disorder, no frequent epistaxis  Endocrine:  no diabetes, does not check CBG's at home           Physical Exam:   BP 129/69   Pulse (!) 45   Resp 20   Ht 5' 4.5" (1.638 m)   Wt 129 lb 9.6 oz (58.8 kg)   SpO2 97% Comment: RA  BMI 21.90 kg/m   General:  Elderly but well-appearing  HEENT:  Unremarkable   Neck:   no JVD, no bruits, no adenopathy   Chest:   clear to auscultation, symmetrical breath sounds, no wheezes, no rhonchi   CV:   RRR, grade III/VI crescendo/decrescendo murmur heard best at RUSB,  no diastolic murmur  Abdomen:  soft, non-tender, no masses   Extremities:  warm, well-perfused, pulses palpable, no LE edema  Rectal/GU  Deferred  Neuro:   Grossly non-focal and symmetrical throughout  Skin:   Clean and dry, no rashes, no   breakdown   Diagnostic Tests:  Transthoracic Echocardiography  Patient:    Khamis, Ryonna R MR #:       [id number] Study Date: 06/17/2016 Gender:     F Age:        75 Height:     162.6 cm Weight:     62.3 kg BSA:        1.68 m^2 Pt. Status: Room:       1231   REFERRING    Jay Ganji, MD  SONOGRAPHER  Johanna Elliott  ORDERING     Ganji, Jagadeesh R  PERFORMING   [facility], Inpatient  ADMITTING    Swinteck, Brian [id number]  ATTENDING    Swinteck, Brian [id number]  cc:  ------------------------------------------------------------------- LV EF: 60% -   65%  ------------------------------------------------------------------- Indications:      Abnormal EKG 794.31.  ------------------------------------------------------------------- History:   PMH:  aortic valve replacement and mitral valve ring repair on 10/12/2011. Bradycardia.  Congestive heart failure.  Risk factors:   Hypertension.  ------------------------------------------------------------------- Study Conclusions  - Left ventricle: The cavity size was normal. Wall thickness was   increased in a pattern of mild LVH. Systolic function was normal.   The estimated ejection fraction was in the range of 60% to 65%.   Wall motion was normal; there were no regional wall motion   abnormalities. - Aortic valve: There was a bioprosthetic aortic valve. The valve   was poorly visualized. There was turbulence across the valve and   severely elevated mean gradient. Mean gradient (S): 41 mm Hg.   Peak gradient (S): 80 mm Hg. - Mitral valve: Status post mitral valve repair. No significant   stenosis or regurgitation. Mean gradient (D): 4 mm Hg. Valve area   by pressure half-time: 2.65 cm^2. - Right ventricle: The cavity size was normal. Systolic function   was mildly reduced. - Tricuspid valve: Peak RV-RA gradient (S): 44 mm Hg. - Pulmonary arteries: PA peak pressure: 47 mm Hg (S). - Inferior vena cava: The vessel was normal in size. The   respirophasic diameter changes were in the normal range (>= 50%),   consistent with normal central venous pressure.  Impressions:  - Normal LV size and systolic function with EF 60-65%. Normal RV   size with mildly decreased systolic function. S/p mitral valve   repair, no significant stenosis or regurgitation. 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 Would consider TEE, ?pannus versus thrombus. Mild   pulmonary hypertension.  ------------------------------------------------------------------- Study data:  Comparison was made to the study of 09/04/2011.  Study status:  Routine.  Procedure:  Transthoracic echocardiography. Image quality was adequate.          Transthoracic echocardiography.  M-mode, complete 2D, spectral Doppler, and color Doppler.  Birthdate:  Patient birthdate: 12/04/1940.  Age:  Patient is 75 yr  old.  Sex:  Gender: female.    BMI: 23.6 kg/m^2.  Blood pressure:     152/90  Patient status:  Inpatient.  Study date: Study date: 06/17/2016. Study time: 03:36 PM.  Location:  Bedside.   -------------------------------------------------------------------  ------------------------------------------------------------------- Left ventricle:  The cavity size was normal. Wall thickness was increased in a pattern of mild LVH. Systolic function was normal. The estimated ejection fraction was in the range of 60% to 65%. Wall motion was normal; there were no regional wall motion abnormalities.  ------------------------------------------------------------------- Aortic valve:  There was a bioprosthetic aortic valve. The valve was poorly visualized. There was turbulence across   the valve and severely elevated mean gradient.  Doppler:     VTI ratio of LVOT to aortic valve: 0.27. Peak velocity ratio of LVOT to aortic valve: 0.23. Mean velocity ratio of LVOT to aortic valve: 0.27.    Mean gradient (S): 41 mm Hg. Peak gradient (S): 80 mm Hg.  ------------------------------------------------------------------- Aorta:  Aortic root: The aortic root was normal in size. Ascending aorta: The ascending aorta was normal in size.  ------------------------------------------------------------------- Mitral valve:  Status post mitral valve repair. No significant stenosis or regurgitation.  Doppler:     Valve area by pressure half-time: 2.65 cm^2. Indexed valve area by pressure half-time: 1.57 cm^2/m^2.    Mean gradient (D): 4 mm Hg. Peak gradient (D): 7 mm Hg.  ------------------------------------------------------------------- Left atrium:  The atrium was normal in size.  ------------------------------------------------------------------- Right ventricle:  The cavity size was normal. Systolic function was mildly  reduced.  ------------------------------------------------------------------- Pulmonic valve:    Structurally normal valve.   Cusp separation was normal.  Doppler:  Transvalvular velocity was within the normal range. There was trivial regurgitation.  ------------------------------------------------------------------- Tricuspid valve:   Doppler:  There was mild regurgitation.  ------------------------------------------------------------------- Right atrium:  The atrium was normal in size.  ------------------------------------------------------------------- Pericardium:  There was no pericardial effusion.  ------------------------------------------------------------------- Systemic veins: Inferior vena cava: The vessel was normal in size. The respirophasic diameter changes were in the normal range (>= 50%), consistent with normal central venous pressure.  ------------------------------------------------------------------- Measurements   Left ventricle                         Value          Reference  LV ID, ED, PLAX chordal        (L)     37.4  mm       43 - 52  LV ID, ES, PLAX chordal        (L)     20.6  mm       23 - 38  LV fx shortening, PLAX chordal         45    %        >=29  LV PW thickness, ED                    10.3  mm       ----------  IVS/LV PW ratio, ED                    0.86           <=1.3  LV e&', lateral                         6     cm/s     ----------  LV E/e&', lateral                       22.67          ----------  LV e&', medial                          3.57  cm/s     ----------  LV E/e&', medial                        38.1           ----------  LV e&',   average                         4.79  cm/s     ----------  LV E/e&', average                       28.42          ----------    Ventricular septum                     Value          Reference  IVS thickness, ED                      8.83  mm       ----------    LVOT                                    Value          Reference  LVOT peak velocity, S                  99.4  cm/s     ----------  LVOT mean velocity, S                  75.1  cm/s     ----------  LVOT VTI, S                            22    cm       ----------    Aortic valve                           Value          Reference  Aortic valve peak velocity, S          434   cm/s     ----------  Aortic valve mean velocity, S          282   cm/s     ----------  Aortic valve VTI, S                    82.5  cm       ----------  Aortic mean gradient, S                39    mm Hg    ----------  Aortic peak gradient, S                75    mm Hg    ----------  VTI ratio, LVOT/AV                     0.27           ----------  Velocity ratio, peak, LVOT/AV          0.23           ----------  Velocity ratio, mean, LVOT/AV          0.27           ----------    Aorta                                    Value          Reference  Aortic root ID, ED                     31    mm       ----------    Left atrium                            Value          Reference  LA ID, A-P, ES                         38    mm       ----------  LA ID/bsa, A-P                 (H)     2.26  cm/m^2   <=2.2  LA volume, S                           34.4  ml       ----------  LA volume/bsa, S                       20.4  ml/m^2   ----------  LA volume, ES, 1-p A4C                 28.9  ml       ----------  LA volume/bsa, ES, 1-p A4C             17.2  ml/m^2   ----------  LA volume, ES, 1-p A2C                 39.7  ml       ----------  LA volume/bsa, ES, 1-p A2C             23.6  ml/m^2   ----------    Mitral valve                           Value          Reference  Mitral E-wave peak velocity            136   cm/s     ----------  Mitral A-wave peak velocity            117   cm/s     ----------  Mitral mean velocity, D                91.5  cm/s     ----------  Mitral deceleration time       (H)     254   ms       150 - 230  Mitral pressure half-time              83     ms       ----------  Mitral mean gradient, D                4     mm Hg    ----------  Mitral peak gradient, D                7     mm Hg    ----------  Mitral E/A ratio, peak                   1.2            ----------  Mitral valve area, PHT, DP             2.65  cm^2     ----------  Mitral valve area/bsa, PHT, DP         1.57  cm^2/m^2 ----------  Mitral annulus VTI, D                  40.3  cm       ----------    Pulmonary arteries                     Value          Reference  PA pressure, S, DP             (H)     47    mm Hg    <=30    Tricuspid valve                        Value          Reference  Tricuspid regurg peak velocity         331   cm/s     ----------  Tricuspid peak RV-RA gradient          44    mm Hg    ----------    Right atrium                           Value          Reference  RA ID, S-I, ES, A4C            (H)     51    mm       34 - 49  RA area, ES, A4C                       13.5  cm^2     8.3 - 19.5  RA volume, ES, A/L                     30.3  ml       ----------  RA volume/bsa, ES, A/L                 18    ml/m^2   ----------    Systemic veins                         Value          Reference  Estimated CVP                          3     mm Hg    ----------    Right ventricle                        Value          Reference  TAPSE                                  16.1  mm       ----------  Legend: (L)  and  (H)  mark values outside specified reference range.  -------------------------------------------------------------------   Prepared and Electronically Authenticated by  Dalton McLean, M.D. 2018-04-28T16:51:26   Transesophageal Echocardiography  (Report amended )  Patient:    Bosch, Loyda R MR #:       [id number] Study Date: 08/22/2016 Gender:     F Age:        75 Height:     162.6 cm Weight:     62.3 kg BSA:        1.68 m^2 Pt. Status: Room:   ATTENDING    Jay Ganji, MD  PERFORMING   Jay Ganji, MD  REFERRING    Jay Ganji, MD   ADMITTING    Ganji, Jagadeesh R  ORDERING     Ganji, Jagadeesh R  SONOGRAPHER  Tina West  cc:  ------------------------------------------------------------------- LV EF: 60% -   65%  ------------------------------------------------------------------- Indications:      Aortic stenosis 424.1.  ------------------------------------------------------------------- History:   PMH:  Bradycardia. Heart block.  Dyspnea.  Mitral valve disease.  Risk factors:  Hypertension.  ------------------------------------------------------------------- Study Conclusions  - Left ventricle: There was mild concentric hypertrophy. Systolic   function was normal. The estimated ejection fraction was in the   range of 60% to 65%. - Aortic valve: Very difficult to visualize the leaflets. Severe   bioprosthetic aortic valve stenosis. Peak gradient 63, mean   gradient 33 mmHg, calculated aortic valve area 0.64 cm^2 and 0.70   cm^2 by V-max. Mildly calcified annulus. A bioprosthesis was   present and functioning abnormally. 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 Valve mobility was severely restricted. Valve area   (VTI): 0.58 cm^2. Valve area (Vmax): 0.7 cm^2. Valve area   (Vmean): 0.6 cm^2. - Mitral valve: An annular ring prosthesis was present and   functioning normally. The prosthesis had a normal range of   motion. - Left atrium: The atrium was dilated. No evidence of thrombus in   the atrial cavity or appendage. - Right atrium: No evidence of thrombus in the atrial cavity or   appendage. - Impressions: The right ventricular systolic pressure was   increased consistent with moderate pulmonary hypertension. PASP   45-50 mm Hg.  Impressions:  - The right ventricular systolic pressure was increased consistent   with moderate pulmonary hypertension. PASP 45-50 mm Hg.  ------------------------------------------------------------------- Study data:   Study status:  Routine.  Consent:  The  risks, benefits, and alternatives to the procedure were explained to the patient and informed consent was obtained.  Procedure:  Initial setup. The patient was brought to the laboratory. Surface ECG leads were monitored. Sedation. Conscious sedation was administered by cardiology staff. Transesophageal echocardiography. An adult multiplane transesophageal probe was inserted by the attending cardiologistwithout difficulty. Image quality was adequate.  Study completion:  The patient tolerated the procedure well. There were no complications.  Administered medications:   Fentanyl, 12.5mcg, IV.  Midazolam, 3mg, IV.          Diagnostic transesophageal echocardiography.  2D and color Doppler.  Birthdate:  Patient birthdate: 11/26/1940.  Age:  Patient is 75 yr old.  Sex:  Gender: female.    BMI: 23.6 kg/m^2.  Blood pressure:     158/116  Patient status:  Inpatient.  Study date:  Study date: 08/22/2016. Study time: 11:44 AM.  Location:  Endoscopy.  -------------------------------------------------------------------  ------------------------------------------------------------------- Left ventricle:  There was mild concentric hypertrophy. Systolic function was normal. The estimated ejection fraction was in the range of 60% to 65%.  ------------------------------------------------------------------- Aortic valve:  Very difficult to visualize the leaflets. Severe bioprosthetic aortic valve   stenosis. Peak gradient 63, mean gradient 33 mmHg, calculated aortic valve area 0.64 cm^2 and 0.70 cm^2 by V-max.  Mildly calcified annulus. A bioprosthesis was present and functioning abnormally. There was severe pannus formation. Valve mobility was severely restricted.  Doppler: There was severe stenosis.      VTI ratio of LVOT to aortic valve: 0.2. Valve area (VTI): 0.58 cm^2. Indexed valve area (VTI): 0.34 cm^2/m^2. Peak velocity ratio of LVOT to aortic valve: 0.25. Valve area (Vmax): 0.7 cm^2. Indexed  valve area (Vmax): 0.42 cm^2/m^2. Mean velocity ratio of LVOT to aortic valve: 0.21. Valve area (Vmean): 0.6 cm^2. Indexed valve area (Vmean): 0.36 cm^2/m^2. Mean gradient (S): 30 mm Hg. Peak gradient (S): 63 mm Hg.  ------------------------------------------------------------------- Aorta:  The aorta was normal, not dilated, and non-diseased.  ------------------------------------------------------------------- Mitral valve:  An annular ring prosthesis was present and functioning normally. The prosthesis had a normal range of motion. Mobility was not restricted.  Doppler:  Transvalvular velocity was within the normal range. There was no significant regurgitation.   ------------------------------------------------------------------- Left atrium:  The atrium was dilated.  No evidence of thrombus in the atrial cavity or appendage. The appendage was morphologically a left appendage. Emptying velocity was normal.  ------------------------------------------------------------------- Atrial septum:  Well visualized.  Doppler showed no shunt.  ------------------------------------------------------------------- Right ventricle:  The cavity size was normal. Wall thickness was normal. Systolic function was normal.  ------------------------------------------------------------------- Pulmonic valve:    Structurally normal valve.   Cusp separation was normal.  No evidence of vegetation.  Doppler:  There was trivial regurgitation.  ------------------------------------------------------------------- Tricuspid valve:   Structurally normal valve.   Leaflet separation was normal.  Doppler:  There was mild regurgitation.  ------------------------------------------------------------------- Right atrium:  The atrium was normal in size.  No evidence of thrombus in the atrial cavity or appendage.  ------------------------------------------------------------------- Pericardium:  The pericardium  was normal in appearance. There was no pericardial effusion.  ------------------------------------------------------------------- Post procedure conclusions Ascending Aorta:  - The aorta was normal, not dilated, and non-diseased.  ------------------------------------------------------------------- Measurements   Left ventricle                           Value  Stroke volume, 2D                        50    ml  Stroke volume/bsa, 2D                    30    ml/m^2    LVOT                                     Value  LVOT ID, S                               19    mm  LVOT area                                2.84  cm^2  LVOT peak velocity, S                    98.2  cm/s  LVOT mean velocity, S                      52.5  cm/s  LVOT VTI, S                              17.6  cm  LVOT peak gradient, S                    4     mm Hg    Aortic valve                             Value  Aortic valve peak velocity, S            398   cm/s  Aortic valve mean velocity, S            249   cm/s  Aortic valve VTI, S                      86.7  cm  Aortic mean gradient, S                  30    mm Hg  Aortic peak gradient, S                  63    mm Hg  VTI ratio, LVOT/AV                       0.2  Aortic valve area, VTI                   0.58  cm^2  Aortic valve area/bsa, VTI               0.34  cm^2/m^2  Velocity ratio, peak, LVOT/AV            0.25  Aortic valve area, peak velocity         0.7   cm^2  Aortic valve area/bsa, peak velocity     0.42  cm^2/m^2  Velocity ratio, mean, LVOT/AV            0.21  Aortic valve area, mean velocity         0.6   cm^2  Aortic valve area/bsa, mean velocity     0.36  cm^2/m^2  Legend: (L)  and  (H)  mark values outside specified reference range.  ------------------------------------------------------------------- Amended  Jay Ganji, MD 2018-07-07T10:13:25  CORONARY ANGIOGRAPHY  RIGHT HEART CATH  Conclusion   Angiographically no significant  coronary artery disease Normal filling pressures  Procedural Details/Technique   Technical Details Right heart catheterization and selective right and left coronary angiography was performed with continuous hemodynamic monitoring  Indication: Preop evaluation for noncardiac surgery, severe aortic stenosis.  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 Given her severe aortic stenosis, she was referred for coronary angiography and right heart catheterization as pre-op evaluation.  Light sedation given using 0.5 mg Versed.  Technique: Under sterile conditions 22-gauge right before meals venous access was exchanged for a 5 French venous sheath. Right radial access for his obtained. Cocktail of 1.5 mg verapamil and 100 g of nitroglycerin glycerin was occlusive.  Right heart catheterization was performed using a balloon tipped 5 French wedge catheter.   We used a Glidewire to assist advancing the catheter. Before stopping the pulmonary artery pressures for by pulmonary capillary wedge pressures and pulmonary artery sat duration. In the obtained right ventricular and right atrial pressures.  Left heart catheterization was performed using 5 French TIG 4.0 catheter for left coronary artery and a 5 French JL4 catheter for right coronary artery. Coronary angiogram demonstrated normal coronary artery vessels with no angiographically significant coronary artery disease.   Estimated blood loss <50 mL.  During this procedure the patient was administered the following to achieve and maintain moderate conscious sedation: Versed 0.5 mg, while the patient's heart rate, blood pressure, and oxygen saturation were continuously monitored. The period of  conscious sedation was 44 minutes, of which I was present face-to-face 100% of this time.    Complications   Complications documented before study signed (09/27/2016 6:13 AM EDT)    RIGHT/LEFT HEART CATH AND CORONARY ANGIOGRAPHY   None Documented by Patwardhan, Manish J, MD 09/26/2016 5:50 PM EDT  Time Range: Intra-procedure      Coronary Findings   Dominance: Right  Left Main  Vessel was injected. Vessel is normal in caliber. Vessel is angiographically normal.  Left Anterior Descending  Vessel was injected. Vessel is normal in caliber. Vessel is angiographically normal.  Ramus Intermedius  Vessel was injected. Vessel is normal in caliber. Vessel is angiographically normal.  Left Circumflex  Vessel was injected. Vessel is normal in caliber. Vessel is angiographically normal.  Right Coronary Artery  Vessel was injected. Vessel is normal in caliber. Vessel is angiographically normal.  Right Heart   Right Heart Pressures RA mean pressure 1 mmHg, A wave 6 mmHg, V wave 3 mmHg RV pressure 43/1 mmHg with RVEDP 3 mmHg PA pressure 37/9 mmHg with mean pressure 19 mmHg PCWP 9 mmHg, A wave 10 mmHg, V wave 14 mmHg    Coronary Diagrams   Diagnostic Diagram       Implants     No implant documentation for this case.  MERGE Images   Show images for Cardiac catheterization   Link to Procedure Log   Procedure Log    Hemo Data    Most Recent Value  Fick Cardiac Output 3.99 L/min  Fick Cardiac Output Index 2.42 (L/min)/BSA  RA A Wave 6 mmHg  RA V Wave 3 mmHg  RA Mean 1 mmHg  RV Systolic Pressure 43 mmHg  RV Diastolic Pressure -1 mmHg  RV EDP 3 mmHg  PA Systolic Pressure 37 mmHg  PA Diastolic Pressure 9 mmHg  PA Mean 19 mmHg  PW A Wave 10 mmHg  PW V Wave 14 mmHg  PW Mean 9 mmHg  AO Systolic Pressure 172 mmHg  AO Diastolic Pressure 78 mmHg  AO Mean 111 mmHg  QP/QS 1  TPVR Index 7.86 HRUI  TSVR Index 45.92 HRUI  PVR SVR Ratio 0.09  TPVR/TSVR Ratio 0.17    Cardiac  TAVR CT  TECHNIQUE: The patient was scanned on a Siemens Force 192 slice scanner. A 120 kV retrospective scan was triggered in the ascending thoracic aorta at 140 HU's. Gantry rotation speed was 250 msecs and collimation was .6 mm. No beta blockade or nitro were given. The 3D data set was reconstructed in 5% intervals of the R-R cycle. Systolic and diastolic phases were analyzed on a dedicated work station using MPR, MIP and VRT modes. The patient received 80 cc of contrast.  FINDINGS: Aortic Valve: The is a 21 mm Edwards stented bovine pericardial Magna Ease bioprosthetic   valve There is no stent creep with post to post diameter about 18 mm. The valve appears small compared to the coronary sinuses The leaflets only thicken near the base. There is no appreciable calcification. There does appear to be commissural fusion near the base especially between the right leaflet. The base of the leaflets are thickened and hypodense but this more likely represents artifact from the stents as opposed to subclinical thrombus. There is no appreciable pannus seen.  Aorta:  27.5 mm  Sino-tubular Junction:  26 mm  Ascending Thoracic Aorta:  27.5 mm  Aortic Arch:  25 mm  Descending Thoracic Aorta:  Tortuous 23 mm  Sinus of Valsalva Measurements:  Non-coronary:  30.9 mm  Right - coronary:  29.8 mm  Left -   coronary:  31.5 mm  Coronary Artery Height above Annulus:  Left Main:  11.3 mm above annulus  Right Coronary:  16 mm above annulus  Virtual Basal Annulus Measurements:  Maximum / Minimum Diameter: Inner diameter of bioprosthetic valve 21 mm  Perimeter:  68.9 mm  Area:  377 mm2  IMPRESSION: 1) 21 mm Magna Ease bioprosthetic valve. Planimetered area 1.5 cm2. Some thickening of the basal leaflets and commissural fusion but no obvious subclinical thrombus or pannus  2) Suitable coronary height for valve in valve procedure  3) Normal Internal Diameter ID  21 mm for this valve  4) Normal aortic root 27.5 mm  5) Relatively large coronary sinuses for implanted valve  Peter Nishan  Electronically Signed: By: Peter  Nishan M.D. On: 11/09/2016 18:37    CT ANGIOGRAPHY CHEST, ABDOMEN AND PELVIS  TECHNIQUE: Multidetector CT imaging through the chest, abdomen and pelvis was performed using the standard protocol during bolus administration of intravenous contrast. Multiplanar reconstructed images and MIPs were obtained and reviewed to evaluate the vascular anatomy.  CONTRAST:  100 mL of Isovue 370.  COMPARISON:  CT of the abdomen and pelvis 12/31/2002.  FINDINGS: CTA CHEST FINDINGS  Cardiovascular: Heart size is enlarged. There is no significant pericardial fluid, thickening or pericardial calcification. Aortic atherosclerosis (mild), without evidence of aneurysm or dissection. Status post median sternotomy for aortic valve replacement with a stented bioprosthesis. Status post mitral annuloplasty.  Mediastinum/Lymph Nodes: No pathologically enlarged mediastinal or hilar lymph nodes. Esophagus is unremarkable in appearance. No axillary lymphadenopathy.  Lungs/Pleura: No suspicious appearing pulmonary nodules or masses. No acute consolidative airspace disease. No pleural effusions.  Musculoskeletal/Soft Tissues: Median sternotomy wires. There are no aggressive appearing lytic or blastic lesions noted in the visualized portions of the skeleton.  CTA ABDOMEN AND PELVIS FINDINGS  Hepatobiliary: 12 mm low-attenuation lesion between segments 6 and 7 in the liver is only slightly larger than remote prior study from 2004, most compatible with a small cyst. No suspicious hepatic lesions. No intra or extrahepatic biliary ductal dilatation. Gallbladder is normal in appearance.  Pancreas: No pancreatic mass. No pancreatic ductal dilatation. No pancreatic or peripancreatic fluid or inflammatory changes.  Spleen:  Unremarkable.  Adrenals/Urinary Tract: Multiple well-defined low-attenuation lesions in both kidneys, compatible with simple cysts, largest of which measures up to 2.7 cm in the upper pole the left kidney. In addition, there are several subcentimeter low-attenuation lesions in both kidneys, too small to definitively characterize, but favored to represent tiny cysts. Bilateral adrenal glands are normal in appearance. No hydroureteronephrosis. Urinary bladder is partially obscured by beam hardening artifact from the patient's right hip arthroplasty, but is unremarkable in appearance.  Stomach/Bowel: Normal appearance of the stomach. No pathologic dilatation of   small bowel or colon. The appendix is not confidently identified and may be surgically absent. Regardless, there are no inflammatory changes noted adjacent to the cecum to suggest the presence of an acute appendicitis at this time.  Vascular/Lymphatic: Aortic atherosclerosis, without evidence of aneurysm or dissection in the abdominal or pelvic vasculature. Vascular findings and measurements pertinent to potential TAVR procedure, as detailed below. Celiac axis, superior mesenteric artery and inferior mesenteric artery are all widely patent without hemodynamically significant stenosis. Single renal artery is bilaterally appear widely patent. No lymphadenopathy noted in the abdomen or pelvis.  Reproductive: Coarse calcification in the uterine fundus, presumably a calcified fibroid. Ovaries are atrophic.  Other: No significant volume of ascites.  No pneumoperitoneum.  Musculoskeletal: Status post right hip arthroplasty. Advanced joint space narrowing, subchondral sclerosis, subchondral cyst formation and osteophyte formation in the left hip joint, compatible with severe osteoarthritis. Left-sided protrusio acetabulae. There are no aggressive appearing lytic or blastic lesions noted in the visualized portions of the  skeleton.  VASCULAR MEASUREMENTS PERTINENT TO TAVR:  AORTA:  Minimal Aortic Diameter -  20 x 18 mm  Severity of Aortic Calcification -  mild  RIGHT PELVIS:  Right Common Iliac Artery -  Minimal Diameter - 12.2 x 11.9 mm  Tortuosity - moderate  Calcification - mild  Right External Iliac Artery -  Minimal Diameter - 8.2 x 7.8 mm  Tortuosity - moderate  Calcification - none  Right Common Femoral Artery -  Minimal Diameter - 7.8 x 7.8 mm  Tortuosity - mild  Calcification - none  LEFT PELVIS:  Left Common Iliac Artery -  Minimal Diameter - 12.8 x 11.6 mm  Tortuosity - moderate  Calcification - mild  Left External Iliac Artery -  Minimal Diameter - 9.0 x 8.0 mm  Tortuosity - moderate to severe  Calcification - none  Left Common Femoral Artery -  Minimal Diameter - 7.3 x 8.4 mm  Tortuosity - mild  Calcification - none  Review of the MIP images confirms the above findings.  IMPRESSION: 1. Vascular findings and measurements pertinent to potential TAVR procedure, as detailed above. This patient does have suitable pelvic arterial access bilaterally. 2. Status post aortic valve replacement with a stented aortic bioprosthesis. 3. Cardiomegaly. 4. Aortic atherosclerosis. 5. Additional incidental findings, as above. Aortic Atherosclerosis (ICD10-I70.0).   Electronically Signed   By: Daniel  Entrikin M.D.   On: 11/13/2016 12:12   Impression:  I have personally reviewed the transthoracic echocardiogram performed in April of this year, the transesophageal echocardiogram performed in July, diagnostic cardiac catheterization performed in August, and CT angiograms performed more recently.   According to Dr. Ganji's records, the patient had normal LV systolic function but moderately elevated transvalvular gradient across the aortic valve prior to the patient's hip replacement last spring.  Immediately following hip  replacement the mean transvalvular gradient was estimated approximately 40 mmHg in the setting of severe iron deficient anemia. It is unclear whether not the patient was on dopamine at the time. Subsequent transesophageal echocardiogram performed in early July revealed mean transvalvular gradient estimated 33 mmHg, but all 3 leaflets appeared to be moving reasonably well with minimal leaflet restriction and aortic valve area estimated greater than 1.8 cm by planimetry. Cardiac gated angiogram of the heart does not reveal findings suggestive of significant pannus formation and all 3 leaflets appear to move reasonably well.  Valve area by planimetry was estimated greater than 1.5 cm. The possibility of micro-thrombus involving portions of the leaflets cannot be   definitively ruled out.  I have personally reviewed the patient's TEE and CT angiogram with Dr. Cooper and Dr. Nishan. We are not sufficiently convinced that the patient has significant leaflet pathology involving her bioprosthetic tissue valve to warrant valve replacement at this time. Transvalvular gradient across the valve clearly appears to be at least moderately elevated, but this may be at least partially related to some degree of patient prosthesis mismatch. It is possible that there may be some leaflet restriction and/or fibrosis near the commissures of the valves, but the limited images from the transesophageal echocardiogram and the cardiac gated CT angiogram do not reveal clear evidence of significant leaflet deterioration, thickening or calcification.  Moreover, the patient denies any symptoms of exertional shortness of breath or chest discomfort, although admittedly she is not very active physically because of severe pain in her hips.  Her episode of transient hypotension and chest discomfort that occurred immediately following hip replacement last spring developed in the setting of profound bradycardia and acute blood loss  anemia.   Plan:  I discussed matters with the patient in the office today.  As a next step we plan to proceed with repeat transthoracic and transesophageal echocardiograms and left and right heart catheterization with direct assessment of the transvalvular gradient across the aortic valve. Pending results we will discuss treatment options further. All of her questions been addressed.   I spent in excess of 60 minutes during the conduct of this office consultation and >50% of this time involved direct face-to-face encounter with the patient for counseling and/or coordination of their care.     Clarence H. Owen, MD 11/21/2016 2:14 PM   

## 2016-12-13 ENCOUNTER — Other Ambulatory Visit: Payer: Self-pay | Admitting: Physician Assistant

## 2016-12-13 ENCOUNTER — Encounter (HOSPITAL_COMMUNITY): Payer: Self-pay | Admitting: Physician Assistant

## 2016-12-13 DIAGNOSIS — I442 Atrioventricular block, complete: Secondary | ICD-10-CM | POA: Diagnosis not present

## 2016-12-13 DIAGNOSIS — I110 Hypertensive heart disease with heart failure: Secondary | ICD-10-CM | POA: Diagnosis not present

## 2016-12-13 DIAGNOSIS — T82857A Stenosis of cardiac prosthetic devices, implants and grafts, initial encounter: Secondary | ICD-10-CM | POA: Diagnosis not present

## 2016-12-13 DIAGNOSIS — I5032 Chronic diastolic (congestive) heart failure: Secondary | ICD-10-CM | POA: Diagnosis present

## 2016-12-13 DIAGNOSIS — I350 Nonrheumatic aortic (valve) stenosis: Secondary | ICD-10-CM

## 2016-12-13 LAB — GLUCOSE, CAPILLARY: Glucose-Capillary: 110 mg/dL — ABNORMAL HIGH (ref 65–99)

## 2016-12-13 NOTE — Care Management Note (Signed)
Case Management Note  Patient Details  Name: [name] MRN: [id number] Date of Birth: 05/26/1940  Subjective/Objective:      Pt placed in observation for right heart cath for evaluation of valve - post procedure developed sinus brady              Action/Plan:  PTA independent from home alone.  Pt has PCP and denied barriers to obtaining medications as prescribed.  Pts daughter will drive pt home via private vehicle.  No CM needs determined prior to discharge   Expected Discharge Date:  12/13/16               Expected Discharge Plan:     In-House Referral:     Discharge planning Services  CM Consult  Post Acute Care Choice:    Choice offered to:     DME Arranged:    DME Agency:     HH Arranged:    HH Agency:     Status of Service:  Completed, signed off  If discussed at [employer] Length of Stay Meetings, dates discussed:    Additional Comments:  Cherylann ParrClaxton, Tangee Marszalek S, RN 12/13/2016, 2:29 PM

## 2016-12-13 NOTE — Discharge Instructions (Signed)
You are scheduled for a pacemaker implant with Dr. Ladona Ridgelaylor on Thursday 12/21/16 at 9:30AM.   You need to arrive to the hospital at 7:30AM, at the Main hospital entrance [facility] and check in at admitting desk DO NOT eat or drink anything after midnight the evening prior DO NOT take any of your morning medicines You will need to stay over night with planned discharge the following morning

## 2016-12-13 NOTE — Care Management CC44 (Signed)
Condition Code 44 Documentation Completed  Patient Details  Name: [name] MRN: [id number] Date of Birth: 11/24/1940   Condition Code 44 given:  Yes Patient signature on Condition Code 44 notice:  Yes Documentation of 2 MD's agreement:  Yes Code 44 added to claim:  Yes    Cherylann ParrClaxton, Jonty Morrical S, RN 12/13/2016, 2:36 PM

## 2016-12-13 NOTE — Care Management Obs Status (Signed)
MEDICARE OBSERVATION STATUS NOTIFICATION   Patient Details  Name: [name] MRN: [id number] Date of Birth: 11/21/1940   Medicare Observation Status Notification Given:  Yes    Havoc Sanluis S, RN 12/13/2016, 2:36 PM 

## 2016-12-13 NOTE — Care Management Obs Status (Signed)
MEDICARE OBSERVATION STATUS NOTIFICATION   Patient Details  Name: [name] MRN: [id number] Date of Birth: 04/13/1940   Medicare Observation Status Notification Given:  Yes    Cherylann ParrClaxton, Verneal Wiers S, RN 12/13/2016, 2:36 PM

## 2016-12-13 NOTE — Discharge Summary (Signed)
[facility]   MULTIDISCIPLINARY HEART VALVE TEAM   Discharge Summary    Patient ID: [name],  MRN: [id number], DOB/AGE: 77-Mar-1942 77 y.o.  Admit date: 12/12/2016 Discharge date: 12/13/2016  Primary Care Provider: Ileana Ladd Primary Cardiologist: Dr. Jacinto Halim / Dr. Excell Seltzer (TAVR) / Dr. Ladona Ridgel (EP)   Discharge Diagnoses    Principal Problem:   Complete heart block [facility]) Active Problems:   Hypertension   Arthropathy of right hip   Stenosis of prosthetic aortic valve   Chronic diastolic CHF (congestive heart failure) (HCC)   Allergies No Known Allergies   History of Present Illness     [name] is a 76 y.o. 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 She as admitted 2/2 post TEE complete heart block.   She underwent aortic valve replacement using a 21 mm Edwards Magna Ease bovine pericardialtissue valve and mitral valve ring annuloplasty by Dr. Laneta Simmers in 2013. Her immediate postoperative recovery was notable for mediastinal bleeding requiring reexploration. She subsequently developed atrial fibrillation requiring DC cardioversion. Subsequent follow-up echocardiograms performed at Dr. Verl Dicker office reportedly demonstrated normalization of left ventricular systolic function with moderately elevated transvalvular gradient across the aortic valve. She was also reported to have chronic paroxysmal complete heart block and junctional rhythm without associated symptoms of symptomatic bradycardia.   She has remained active and has only been limited by her bilateral hip arthritis. She underwent right hip replacement on 06/15/2016 and her postop course was complicated by hypotension requiring dopamine in the setting of severe  bradycardia and anemia requiring transfusion.   She had an echo done on 06/17/2016 showing a normal LVEF of 60-65% with severe prosthetic aortic valve stenosis with a mean gradient of 41 mm Hg and a peak of 80 mm Hg. She made a good recovery after her hip replacement. 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 She returned to see Dr. Jacinto Halim for cardiac evaluation and had a TEE on 08/22/2016 that showed a mean AV gradient of 33 mm Hg with a peak of 63 mm Hg. The valve leaflets were difficult to see but two of them appeared to be moving and one was fixed. The valve area was estimated at 0.6 to 0.7 cm2. The LVEF was 60-65% with no significant MR. She underwent cath on 09/26/2016 showing no coronary disease. She was seen by Dr. Excell Seltzer on 10/19/2016 for consideration of valve in valve TAVR. She denied shortness of breath or fatigue, no chest pain, no dizziness or syncope but is not very active at this time due to her hip arthritis.  She was seen by both Dr. Laneta Simmers and Dr. Cornelius Moras for cardiothoracic surgery consultation. After a long discussion with the multidisciplinary valve team it was decided that we were not sufficiently convinced that the patient has significant leaflet pathology involving her bioprosthetic tissue valve to warrant valve replacement at this time. Plans were made for repeat TTE, TEE and R/LHC for further evaluation of the valve.   She was set up for outpatient L/RHC and TEE on 12/12/16.   Hospital Course     Consultants: none  She underwent outpatient TEE by Dr. Delton See. Immediately post procedure she developed sinus bradycardia that led  to third degree AVB and ventricular rates down to 20's. Her BP was 105/63 mmHg. She responded to atropine with recovery of SR and ventricular rates in 50's. There was a 5 second pause that was recorded. She was admitted for further work up and observation. Her HR has remained stable since that time  with sinus brady, occasional PACs and no pauses >2 seconds.  Dr. Ladona Ridgel with EP was consulted. Dr. Excell Seltzer had a long discussion with Dr. Ladona Ridgel and her primary cardiologist, Dr. Jacinto Halim. Given recurrent bradycardiac events related to recovery from sedation and/or anesthesia, they ultimately felt that she would require elective pacemaker placement to safely get her through hip surgery to treat her severe end stage hip arthritis. She is felt to have severe bioprosthetic aortic valve based on mean transvalvular gradient > 40 mmHg and may eventually require TAVR. Fortunately, she is asymptomatic at this point in time, but not very physically active due to hip pain. The multidisciplinary heart valve team will review her TEE and discuss further next week.  She may proceed with hip surgery after her pacemaker placement, although we do recommend that it be done at [facility] given her cardiac history.   Her pacemaker placement will be done by Dr. Ladona Ridgel next Thursday on 11/1 @ 9:30am.  _____________  Discharge Vitals Blood pressure 117/67, pulse 66, temperature 97.7 F (36.5 C), temperature source Oral, resp. rate 19, height 5' 4.5" (1.638 m), weight 129 lb (58.5 kg), SpO2 100 %.  Filed Weights   12/12/16 0855  Weight: 129 lb (58.5 kg)    Labs & Radiologic Studies     CBC  Recent Labs  12/12/16 0945  WBC 5.0  HGB 12.3  HCT 37.3  MCV 93.7  PLT 140*   Basic Metabolic Panel  Recent Labs  12/12/16 0945  NA 139  K 4.1  CL 107  CO2 24  GLUCOSE 84  BUN 20  CREATININE 1.17*  CALCIUM 10.8*   Liver Function Tests No results for input(s): AST, ALT, ALKPHOS, BILITOT, PROT, ALBUMIN in the last 72 hours. No results for input(s): LIPASE, AMYLASE in the last 72 hours. Cardiac Enzymes No results for input(s): CKTOTAL, CKMB, CKMBINDEX, TROPONINI in the last 72 hours. BNP Invalid input(s): POCBNP D-Dimer No results for input(s): DDIMER in the last 72 hours. Hemoglobin A1C No  results for input(s): HGBA1C in the last 72 hours. Fasting Lipid Panel No results for input(s): CHOL, HDL, LDLCALC, TRIG, CHOLHDL, LDLDIRECT in the last 72 hours. Thyroid Function Tests No results for input(s): TSH, T4TOTAL, T3FREE, THYROIDAB in the last 72 hours.  Invalid input(s): FREET3  No results found.   Diagnostic Studies/Procedures    TEE 12/12/16 Study Conclusions - Left ventricle: There was mild concentric hypertrophy. Systolic   function was normal. The estimated ejection fraction was in the   range of 60% to 65%. Wall motion was normal; there were no   regional wall motion abnormalities. - Aortic valve: A bioprosthetic [facility] Ease 21 mm sits well   in the aortic position. Leaflets open well. There is no central   aortic regurgitation or paravalvular leak. Transaortic gradients   are severely elevated with peak/mean 126/44 mmHg. There was   severe stenosis. - Aorta: There was mild non-mobile atheroma. - Ascending aorta: The ascending aorta was normal in size. - Descending aorta: The descending aorta was normal in size. - Mitral valve: S/p annuloplasty ring 28 mm. - Left atrium: The atrium was dilated. No evidence of thrombus in  the atrial cavity or appendage. - Right atrium: The atrium was dilated. No evidence of thrombus in   the atrial cavity or appendage. - Tricuspid valve: There was moderate regurgitation. - Pulmonary arteries: Systolic pressure was moderately increased.   PA peak pressure: 53 mm Hg (S). Impressions: - A bioprosthetic [facility] Ease 21 mm sits well in the aortic   position. Leaflets open well. There is no central aortic   regurgitation or paravalvular leak. Transaortic gradients are   severely elevated with peak/mean 126/44 mmHg. Good leaflet   opening. Small ring. No obvious calcifications or pannus.Possible   prosthesis to patient mismatch.   S/p annuloplasty ring 28 mm. Normal transmitral gradients. No   mitral regurgitation or  paravalvular leak. _____________    Disposition   Pt is being discharged home today in good condition.  Follow-up Plans & Appointments    Follow-up Information    [facility]. Go on 12/21/2016.   Why:  @ 7:30am at the Main hospital entrance [name]) and check in at admitting desk. DO NOT eat or drink anything after midnight the evening prior. DO NOT take any of your morning medicines  Contact information: [address] [place] [address] [phone]           Discharge Medications     Medication List    TAKE these medications   amLODipine 10 MG tablet Commonly known as:  NORVASC Take 10 mg by mouth daily.   aspirin 81 MG chewable tablet Chew 1 tablet (81 mg total) by mouth 2 (two) times daily.   multivitamin with minerals Tabs tablet Take 1 tablet by mouth daily.   naproxen sodium 220 MG tablet Commonly known as:  ANAPROX Take 440 mg by mouth 2 (two) times daily as needed (for pain.).   spironolactone 25 MG tablet Commonly known as:  ALDACTONE Take 25 mg by mouth daily.   traMADol 50 MG tablet Commonly known as:  ULTRAM Take 50-100 mg by mouth every 6 (six) hours as needed for moderate pain.   vitamin C 500 MG tablet Commonly known as:  ASCORBIC ACID Take 500 mg by mouth daily.         Outstanding Labs/Studies   PPM next week.   Duration of Discharge Encounter   Greater than 30 minutes including physician time.  Signed, Undrea Archbold PA-C 12/13/2016, 3:31 PM  Patient seen, examined. Available data reviewed. Agree with findings, assessment, and plan as outlined by Carlean Jews, PA-C. On my exam today, she is alert, oriented, in NAD. Lungs CTA, heart RRR with 3/6 harsh late-peaking systolic murmur at the LSB, abdomen soft, NT, extremities are without edema.   Tele shows sinus brady, occasional PAC's and compensatory pauses, no pauses > 3 seconds.   Discussed her case at length with Dr Ladona Ridgel  and Dr Jacinto Halim. Complex situation in patient with severe stenosis of a bioprosthetic aortic valve based on mean transvalvular gradient > 400 mmHg, recurrent bradycardiac events related to recovery from sedation and/or anesthesia, and severe end-stage arthritis needing hip replacement. After discussion, we feel it is best to proceed with elective pacemaker placement next week by Dr Ladona Ridgel so that the patient can more safely undergo hip replacement. We are reviewing treatment option regarding her aortic stenosis but I think she can undergo hip replacement before her aortic valve disease is further addressed since she is completely asymptomatic. It would be best for her hip replacement surgery to be done at [facility] considering  her cardiac issues.    OK for DC today, PPM placement next week, multidisciplinary heart valve team will review her TEE and discuss further next week.   Tonny BollmanMichael Cooper, M.D. 12/13/2016 3:31 PM

## 2016-12-21 ENCOUNTER — Encounter (HOSPITAL_COMMUNITY)
Admission: RE | Disposition: A | Discharge status: Home / Self Care | Payer: Self-pay | Source: Ambulatory Visit | Attending: Internal Medicine

## 2016-12-21 ENCOUNTER — Encounter (HOSPITAL_COMMUNITY): Payer: Self-pay | Admitting: General Practice

## 2016-12-21 ENCOUNTER — Ambulatory Visit (HOSPITAL_COMMUNITY)
Admission: RE | Admit: 2016-12-21 | Discharge: 2016-12-22 | Disposition: A | Discharge status: Home / Self Care | Payer: Medicare Other | Source: Ambulatory Visit | Attending: Internal Medicine | Admitting: Internal Medicine

## 2016-12-21 DIAGNOSIS — Z23 Encounter for immunization: Secondary | ICD-10-CM | POA: Diagnosis not present

## 2016-12-21 DIAGNOSIS — M1990 Unspecified osteoarthritis, unspecified site: Secondary | ICD-10-CM | POA: Diagnosis not present

## 2016-12-21 DIAGNOSIS — I509 Heart failure, unspecified: Secondary | ICD-10-CM | POA: Insufficient documentation

## 2016-12-21 DIAGNOSIS — I110 Hypertensive heart disease with heart failure: Secondary | ICD-10-CM | POA: Diagnosis not present

## 2016-12-21 DIAGNOSIS — I442 Atrioventricular block, complete: Secondary | ICD-10-CM | POA: Diagnosis not present

## 2016-12-21 DIAGNOSIS — Z953 Presence of xenogenic heart valve: Secondary | ICD-10-CM | POA: Insufficient documentation

## 2016-12-21 DIAGNOSIS — I495 Sick sinus syndrome: Secondary | ICD-10-CM | POA: Diagnosis not present

## 2016-12-21 DIAGNOSIS — Z950 Presence of cardiac pacemaker: Secondary | ICD-10-CM | POA: Insufficient documentation

## 2016-12-21 HISTORY — DX: Sick sinus syndrome: I49.5

## 2016-12-21 HISTORY — PX: PACEMAKER IMPLANT: EP1218

## 2016-12-21 HISTORY — DX: Personal history of other medical treatment: Z92.89

## 2016-12-21 HISTORY — PX: INSERT / REPLACE / REMOVE PACEMAKER: SUR710

## 2016-12-21 LAB — SURGICAL PCR SCREEN
MRSA, PCR: NEGATIVE
Staphylococcus aureus: NEGATIVE

## 2016-12-21 SURGERY — PACEMAKER IMPLANT

## 2016-12-21 MED ORDER — OFF THE BEAT BOOK
Freq: Once | Status: AC
  Administered 2016-12-21: 21:00:00
  Filled 2016-12-21: qty 1

## 2016-12-21 MED ORDER — AMLODIPINE BESYLATE 10 MG PO TABS
10.0000 mg | ORAL_TABLET | Freq: Every day | ORAL | Status: DC
  Administered 2016-12-21 – 2016-12-22 (×2): 10 mg via ORAL
  Filled 2016-12-21 (×2): qty 1

## 2016-12-21 MED ORDER — HEPARIN (PORCINE) IN NACL 2-0.9 UNIT/ML-% IJ SOLN
INTRAMUSCULAR | Status: AC
  Filled 2016-12-21: qty 500

## 2016-12-21 MED ORDER — INFLUENZA VAC SPLIT HIGH-DOSE 0.5 ML IM SUSY
0.5000 mL | PREFILLED_SYRINGE | INTRAMUSCULAR | Status: AC
Start: 2016-12-22 — End: 2016-12-22
  Administered 2016-12-22: 12:00:00 0.5 mL via INTRAMUSCULAR
  Filled 2016-12-21: qty 0.5

## 2016-12-21 MED ORDER — FENTANYL CITRATE (PF) 100 MCG/2ML IJ SOLN
INTRAMUSCULAR | Status: AC
  Filled 2016-12-21: qty 2

## 2016-12-21 MED ORDER — SODIUM CHLORIDE 0.9 % IV SOLN
INTRAVENOUS | Status: DC
  Administered 2016-12-21: 09:00:00 via INTRAVENOUS

## 2016-12-21 MED ORDER — MUPIROCIN 2 % EX OINT
1.0000 "application " | TOPICAL_OINTMENT | Freq: Once | CUTANEOUS | Status: AC
  Administered 2016-12-21: 1 via TOPICAL
  Filled 2016-12-21: qty 22

## 2016-12-21 MED ORDER — MUPIROCIN 2 % EX OINT
TOPICAL_OINTMENT | CUTANEOUS | Status: AC
  Administered 2016-12-21: 1 via TOPICAL
  Filled 2016-12-21: qty 22

## 2016-12-21 MED ORDER — CHLORHEXIDINE GLUCONATE 4 % EX LIQD
60.0000 mL | Freq: Once | CUTANEOUS | Status: DC
  Filled 2016-12-21: qty 60

## 2016-12-21 MED ORDER — BUPIVACAINE HCL (PF) 0.25 % IJ SOLN
INTRAMUSCULAR | Status: AC
  Filled 2016-12-21: qty 60

## 2016-12-21 MED ORDER — SODIUM CHLORIDE 0.9 % IR SOLN
80.0000 mg | Status: AC
  Administered 2016-12-21: 80 mg
  Filled 2016-12-21: qty 2

## 2016-12-21 MED ORDER — MIDAZOLAM HCL 5 MG/5ML IJ SOLN
INTRAMUSCULAR | Status: DC | PRN
  Administered 2016-12-21: 1 mg via INTRAVENOUS

## 2016-12-21 MED ORDER — FENTANYL CITRATE (PF) 100 MCG/2ML IJ SOLN
INTRAMUSCULAR | Status: DC | PRN
  Administered 2016-12-21: 12.5 ug via INTRAVENOUS

## 2016-12-21 MED ORDER — ONDANSETRON HCL 4 MG/2ML IJ SOLN: 4.0000 mg | Freq: Four times a day (QID) | INTRAMUSCULAR | Status: DC | PRN

## 2016-12-21 MED ORDER — SPIRONOLACTONE 25 MG PO TABS
25.0000 mg | ORAL_TABLET | Freq: Every day | ORAL | Status: DC
  Administered 2016-12-21 – 2016-12-22 (×2): 25 mg via ORAL
  Filled 2016-12-21 (×2): qty 1

## 2016-12-21 MED ORDER — HEPARIN (PORCINE) IN NACL 2-0.9 UNIT/ML-% IJ SOLN
INTRAMUSCULAR | Status: AC | PRN
  Administered 2016-12-21: 500 mL

## 2016-12-21 MED ORDER — MIDAZOLAM HCL 5 MG/5ML IJ SOLN
INTRAMUSCULAR | Status: AC
  Filled 2016-12-21: qty 5

## 2016-12-21 MED ORDER — ASPIRIN 81 MG PO CHEW
81.0000 mg | CHEWABLE_TABLET | Freq: Two times a day (BID) | ORAL | Status: DC
  Administered 2016-12-22: 81 mg via ORAL
  Filled 2016-12-21: qty 1

## 2016-12-21 MED ORDER — BUPIVACAINE HCL (PF) 0.25 % IJ SOLN
INTRAMUSCULAR | Status: DC | PRN
  Administered 2016-12-21: 40 mL

## 2016-12-21 MED ORDER — CEFAZOLIN SODIUM-DEXTROSE 2-4 GM/100ML-% IV SOLN
2.0000 g | INTRAVENOUS | Status: AC
  Administered 2016-12-21: 2 g via INTRAVENOUS
  Filled 2016-12-21: qty 100

## 2016-12-21 MED ORDER — TRAMADOL HCL 50 MG PO TABS
50.0000 mg | ORAL_TABLET | Freq: Four times a day (QID) | ORAL | Status: DC | PRN
  Administered 2016-12-21 – 2016-12-22 (×3): 50 mg via ORAL
  Filled 2016-12-21 (×3): qty 1

## 2016-12-21 MED ORDER — CEFAZOLIN SODIUM-DEXTROSE 1-4 GM/50ML-% IV SOLN
1.0000 g | Freq: Four times a day (QID) | INTRAVENOUS | Status: AC
  Administered 2016-12-21 – 2016-12-22 (×3): 1 g via INTRAVENOUS
  Filled 2016-12-21 (×3): qty 50

## 2016-12-21 MED ORDER — ADULT MULTIVITAMIN W/MINERALS CH
1.0000 | ORAL_TABLET | Freq: Every day | ORAL | Status: DC
  Administered 2016-12-21 – 2016-12-22 (×2): 1 via ORAL
  Filled 2016-12-21 (×2): qty 1

## 2016-12-21 MED ORDER — ACETAMINOPHEN 325 MG PO TABS: 325.0000 mg | ORAL_TABLET | ORAL | Status: DC | PRN

## 2016-12-21 MED ORDER — YOU HAVE A PACEMAKER BOOK
Freq: Once | Status: AC
  Administered 2016-12-21: 21:00:00
  Filled 2016-12-21: qty 1

## 2016-12-21 MED ORDER — VITAMIN C 500 MG PO TABS
500.0000 mg | ORAL_TABLET | Freq: Every day | ORAL | Status: DC
Start: 2016-12-21 — End: 2016-12-22
  Administered 2016-12-21 – 2016-12-22 (×2): 500 mg via ORAL
  Filled 2016-12-21 (×2): qty 1

## 2016-12-21 MED ORDER — CEFAZOLIN SODIUM-DEXTROSE 2-4 GM/100ML-% IV SOLN
INTRAVENOUS | Status: AC
  Filled 2016-12-21: qty 100

## 2016-12-21 MED ORDER — SODIUM CHLORIDE 0.9 % IR SOLN
Status: AC
  Filled 2016-12-21: qty 2

## 2016-12-21 SURGICAL SUPPLY — 11 items
CABLE SURGICAL S-101-97-12 (CABLE) ×3 IMPLANT
CATH RIGHTSITE C315HIS02 (CATHETERS) ×3 IMPLANT
IPG PACE AZUR XT DR MRI W1DR01 (Pacemaker) ×1 IMPLANT
LEAD CAPSURE NOVUS 5076-52CM (Lead) ×3 IMPLANT
LEAD SELECT SECURE 3830 383069 (Lead) ×1 IMPLANT
PACE AZURE XT DR MRI W1DR01 (Pacemaker) ×3 IMPLANT
PAD DEFIB LIFELINK (PAD) ×3 IMPLANT
SELECT SECURE 3830 383069 (Lead) ×3 IMPLANT
SHEATH CLASSIC 7F (SHEATH) ×6 IMPLANT
TRAY PACEMAKER INSERTION (PACKS) ×3 IMPLANT
WIRE HI TORQ VERSACORE-J 145CM (WIRE) ×3 IMPLANT

## 2016-12-21 NOTE — Discharge Summary (Signed)
ELECTROPHYSIOLOGY PROCEDURE DISCHARGE SUMMARY    Patient ID: [name],  MRN: [id number], DOB/AGE: 07-04-1940 76 y.o.  Admit date: 12/21/2016 Discharge date: 12/22/2016  Primary Care Physician: Ileana Ladd, MD Primary Cardiologist: Jacinto Halim TAVR: Excell Seltzer Electrophysiologist: Ladona Ridgel  Primary Discharge Diagnosis:  Symptomatic bradycardia, sinus pauses status post pacemaker implantation this admission  Secondary Discharge Diagnosis:  1.  Severe AS 2.  Hypertension  No Known Allergies   Procedures This Admission:  1.  Implantation of a MDT dual chamber PPM on 12/21/16 by Dr Ladona Ridgel.  The patient received a MDT model number Azure PPM with model number 5076 right atrial lead and 3830 right ventricular lead. There were no immediate post procedure complications. 2.  CXR on 12/22/16 demonstrated no pneumothorax status post device implantation.   Brief HPI: [name] is a 76 y.o. female was referred to electrophysiology in the outpatient setting for consideration of PPM implantation.  Past medical history includes bradycardia and severe AS.  The patient has had symptomatic bradycardia without reversible causes identified.  Risks, benefits, and alternatives to PPM implantation were reviewed with the patient who wished to proceed.   Hospital Course:  The patient was admitted and underwent implantation of a MDT dual chamber PPM with details as outlined above.   Left chest was without hematoma or ecchymosis.  The device was interrogated and found to be functioning normally.  CXR was obtained and demonstrated no pneumothorax status post device implantation.  Wound care, arm mobility, and restrictions were reviewed with the patient.  The patient was examined and considered stable for discharge to home.    Physical Exam: Vitals:   12/21/16 1952 12/21/16 2000 12/21/16 2100 12/22/16 0203  BP: 111/64  110/61 (!) 127/91  Pulse: 60     Resp:  (!) 22 18 19   Temp: 98.8 F (37.1 C)    97.9 F (36.6 C)  TempSrc: Oral   Oral  SpO2: 97%   97%  Weight:    129 lb (58.5 kg)  Height:        GEN- The patient is well appearing, alert and oriented x 3 today.   HEENT: normocephalic, atraumatic; sclera clear, conjunctiva pink; hearing intact; oropharynx clear; neck supple  Lungs- Clear to ausculation bilaterally, normal work of breathing.  No wheezes, rales, rhonchi Heart- Regular rate and rhythm (paced) GI- soft, non-tender, non-distended, bowel sounds present  Extremities- no clubbing, cyanosis, or edema  MS- no significant deformity or atrophy Skin- warm and dry, no rash or lesion, left chest without hematoma/ecchymosis Psych- euthymic mood, full affect Neuro- strength and sensation are intact   Labs:   Lab Results  Component Value Date   WBC 5.0 12/12/2016   HGB 12.3 12/12/2016   HCT 37.3 12/12/2016   MCV 93.7 12/12/2016   PLT 140 (L) 12/12/2016   No results for input(s): NA, K, CL, CO2, BUN, CREATININE, CALCIUM, PROT, BILITOT, ALKPHOS, ALT, AST, GLUCOSE in the last 168 hours.  Invalid input(s): LABALBU  Discharge Medications:  Allergies as of 12/22/2016   No Known Allergies     Medication List    TAKE these medications   amLODipine 10 MG tablet Commonly known as:  NORVASC Take 10 mg by mouth daily.   aspirin 81 MG chewable tablet Chew 1 tablet (81 mg total) by mouth 2 (two) times daily.   ICY HOT EX Apply 1 application topically 3 (three) times daily as needed (for pain.).   multivitamin with minerals Tabs tablet Take 1  tablet by mouth daily.   naproxen sodium 220 MG tablet Commonly known as:  ALEVE Take 440 mg by mouth 2 (two) times daily as needed (for pain.).   spironolactone 25 MG tablet Commonly known as:  ALDACTONE Take 25 mg by mouth daily.   traMADol 50 MG tablet Commonly known as:  ULTRAM Take 50-100 mg by mouth every 6 (six) hours as needed for moderate pain.   vitamin C 500 MG tablet Commonly known as:  ASCORBIC ACID Take 500  mg by mouth daily.       Disposition:  Discharge Instructions    Diet - low sodium heart healthy    Complete by:  As directed    Increase activity slowly    Complete by:  As directed      Follow-up Information    [facility] [address] [facility] Follow up on 01/02/2017.   Specialty:  Cardiology Why:  at [facility] for wound check  Contact information: [address], [address] [place] [id number] [phone]       Marinus Mawaylor, Arien Benincasa W, MD Follow up on 03/27/2017.   Specialty:  Cardiology Why:  at 3PM  Contact information: [address] [place] [address] [phone]           Duration of Discharge Encounter: Greater than 30 minutes including physician time.  Signed, Gypsy BalsamAmber Seiler, NP 12/22/2016 8:33 AM  EP Attending  Patient seen and examined. Agree with above. The patient is stable after PPM insertion. She is stable for DC. Her PPM has been interogated under my direction and is working normally.  Leonia ReevesGregg Amyrie Illingworth,M.D.

## 2016-12-21 NOTE — Interval H&P Note (Signed)
History and Physical Interval Note:  12/21/2016 9:10 AM  [name]  has presented today for surgery, with the diagnosis of bradycardia, sinus node dysfunction  The various methods of treatment have been discussed with the patient and family. After consideration of risks, benefits and other options for treatment, the patient has consented to  Procedure(s): PACEMAKER IMPLANT (N/A) as a surgical intervention .  The patient's history has been reviewed, patient examined, no change in status, stable for surgery.  I have reviewed the patient's chart and labs.  Questions were answered to the patient's satisfaction.     Lewayne BuntingGregg Mazella Deen

## 2016-12-21 NOTE — H&P (View-Only) (Signed)
Cardiology Consultation:   Mindy Mindy; [id number]; 03/05/1940   Admit date: 12/12/2016 Date of Consult: 12/12/2016  Primary Care Provider: Ileana LaddWong, Francis P, MD Primary Cardiologist: Dr. Jacinto HalimGanji TAVR eval: Dr. Excell Seltzerooper Primary Electrophysiologist:  Dr. Ladona Ridgelaylor (2014)   Mindy Profile:   Mindy Mindy is a 76 y.o. 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 Delton SeeNelson.  History of Present Illness:   Mindy Mindy came today for TEE and cath procedure as part of here w/u for he progressive AS and TAVR Mindy.  During Mindy procedure she became profoundly bradycardic with rates towards 20's with prolonged pauses nearly 6 seconds, requiring atropine and   This April she underwent hip surgery inter-op she was observed to have marked SB/junctional bradycardia, she was seen by Dr. Jacinto HalimGanji who knows her, he did not feel anything needed to be done with known bradycardia for her and out Mindy completely asymptomatic.  She apparently did require dopamine post-op 2/2 hypotension/bradycardia though he felt Mindy hypotension was related more to acute blood loss and Mindy Mindy was transfused and wean off dopamine, eventually discharged.   She was evaluated by Dr. Ladona Ridgelaylor in 2014 for CHG on drugs that resolved off meds, remained w/SB 40's though asymptomatic.  I have discussed Mindy Mindy with Dr. Jacinto HalimGanji who knows her well.  She has had long standing bradycardia and has done quite well without symptoms of any exertional intolerances (outside of orthopedic issues) or symptoms of bradycardia.  He reports she has had variable degrees of heart block over Mindy years including Mobitz II and CHB, though given no symptoms no pacing was pursued.  Mindy Mindy has done well though now limited with worsening L hip pain and given her  brady/hypotension issues in April with her left hip surgery, cardiac Mindy of her worsening AS was undertaken.  Given recurrent bradycardic event he is concerned about her pending  Hip surgery as well as potential AV intervention/surgery needs and thinks it may be time for pacing.  Mindy Mindy tells me outside of her hip pain she feels very well.  She denies any dizziness, near syncope or syncope.  No CP or SOB, no DOE.  She does her PT exercises for her hip without exertional intolerances.  She tells me she doesn't feel like she needs a pacemaker, but if everyone thinks she does then she would be willing to consider it.    Past Medical History:  Diagnosis Date  . Aortic stenosis   . Arthritis   . Atrial fibrillation [facility](HCC)    Status post cardioversion November 2013  . CHF (congestive heart failure) (HCC)   . Dysrhythmia   . Hypertension   . Shortness of breath    hx dyspnea and respiratory abnormalities    Past Surgical History:  Procedure Laterality Date  . AORTIC VALVE REPLACEMENT  10/12/2011   Procedure: AORTIC VALVE REPLACEMENT (AVR);  Surgeon: Alleen BorneBryan K Bartle, MD;  [facility];  Service: Open Heart Surgery;  Laterality: N/A;  . CARDIAC CATHETERIZATION    . CARDIOVERSION  12/26/2011   Procedure: CARDIOVERSION;  Surgeon: Pamella PertJagadeesh R Ganji, MD;  [facility];  Service: Cardiovascular;  Laterality: N/A;  . CORONARY ANGIOGRAPHY N/A 09/26/2016   Procedure: CORONARY ANGIOGRAPHY;  Surgeon: Elder NegusPatwardhan, Manish J, MD;  Location: [facility] INVASIVE CV LAB;  Service:  Cardiovascular;  Laterality: N/A;  . EXPLORATION POST OPERATIVE OPEN HEART  10/12/2011   Procedure: EXPLORATION POST OPERATIVE OPEN HEART;  Surgeon: Alleen Borne, MD;  Location: [facility] OR;  Service: Open Heart Surgery;  Laterality: N/A;  . LEFT AND RIGHT HEART CATHETERIZATION WITH CORONARY ANGIOGRAM N/A 09/11/2011   Procedure: LEFT AND RIGHT HEART CATHETERIZATION WITH CORONARY ANGIOGRAM;  Surgeon: Pamella Pert, MD;   Location: [facility] CATH LAB;  Service: Cardiovascular;  Laterality: N/A;  . MITRAL VALVE REPAIR  10/12/2011   Procedure: MITRAL VALVE REPAIR (MVR);  Surgeon: Alleen Borne, MD;  Location: [facility] OR;  Service: Open Heart Surgery;  Laterality: N/A;  mitral valve annuloplasty  . RIGHT HEART CATH N/A 09/26/2016   Procedure: RIGHT HEART CATH;  Surgeon: Elder Negus, MD;  Location: [facility] INVASIVE CV LAB;  Service: Cardiovascular;  Laterality: N/A;  . TEE WITHOUT CARDIOVERSION N/A 08/22/2016   Procedure: TRANSESOPHAGEAL ECHOCARDIOGRAM (TEE);  Surgeon: Yates Decamp, MD;  Location: [facility] ENDOSCOPY;  Service: Cardiovascular;  Laterality: N/A;  . TOTAL HIP ARTHROPLASTY Right 06/15/2016   Procedure: RIGHT TOTAL HIP ARTHROPLASTY ANTERIOR APPROACH;  Surgeon: Samson Frederic, MD;  Location: [facility] ORS;  Service: Orthopedics;  Laterality: Right;  Dr. needs RNFA       Inpatient Medications: Scheduled Meds: . aspirin  81 mg Oral BID  . heparin  5,000 Units Subcutaneous Q8H  . [START ON 12/13/2016] multivitamin with minerals  1 tablet Oral Daily  . sodium chloride flush  3 mL Intravenous Q12H  . [START ON 12/13/2016] spironolactone  25 mg Oral Daily  . [START ON 12/13/2016] vitamin C  500 mg Oral Daily   Continuous Infusions: . sodium chloride 20 mL/hr at 12/12/16 0906  . sodium chloride    . sodium chloride     PRN Meds: sodium chloride, atropine, sodium chloride flush  Allergies:   No Known Allergies  Social History:   Social History   Social History  . Marital status: Single    Spouse name: N/A  . Number of children: N/A  . Years of education: N/A   Occupational History  . Not on file.   Social History Main Topics  . Smoking status: Never Smoker  . Smokeless tobacco: Never Used  . Alcohol use No  . Drug use: No  . Sexual activity: Not on file   Other Topics Concern  . Not on file   Social History Narrative  . No narrative on file    Family History:   Family History  Problem Relation Age of  Onset  . Hypertension Mother      ROS:  Please see Mindy history of present illness.  ROS  All other ROS reviewed and negative.     Physical Exam/Data:   Vitals:   12/12/16 1248 12/12/16 1258 12/12/16 1308 12/12/16 1318  BP: (!) 123/57 121/60 127/60 139/65  Pulse: (!) 41 (!) 45 (!) 41 (!) 43  Resp: (!) 22 (!) 22 (!) 21 16  Temp:      TempSrc:      SpO2: 98% 100% 100% 100%  Weight:      Height:       No intake or output data in Mindy 24 hours ending 12/12/16 1415 Filed Weights   12/12/16 0855  Weight: 129 lb (58.5 kg)   Body mass index is 21.8 kg/m.  General:  Well nourished, well developed, in no acute distress HEENT: normal Lymph: no adenopathy Neck: no JVD Endocrine:  No thryomegaly Vascular: No carotid bruits  Cardiac:  RRR; loud AS murmur Lungs:  CTA b/l, no wheezing, rhonchi or rales  Abd: soft, nontender, no hepatomegaly  Ext: no edema Musculoskeletal:  No deformities, BUE and BLE strength normal and equal Skin: warm and dry  Neuro:  No focal abnormalities noted Psych:  Normal affect   EKG:  Mindy EKG was personally reviewed and demonstrates:   SB 42bpm, PR , QRS , QTc 09/26/16 SB 49bpm 06/18/16 SB 55bpm, 1st degree AVbock, PR 06/16/16 SB, junctional escape beats rates 30's Telemetry:  Telemetry was personally reviewed and demonstrates:   Earlier today bradycardic with junctional escape (narrow QRS) rates 20s', pause nearly 6 seconds Currently is SR 40's-60's  Relevant CV Studies:  12/12/16 TEE Study Conclusions - Left ventricle: There was mild concentric hypertrophy. Systolic   function was normal. Mindy estimated ejection fraction was in Mindy   range of 60% to 65%. Wall motion was normal; there were no   regional wall motion abnormalities. - Aortic valve: A bioprosthetic [facility] Ease 21 mm sits well   in Mindy aortic position. Leaflets open well. There is no central   aortic regurgitation or paravalvular leak. Transaortic gradients    are severely elevated with peak/mean 126/44 mmHg. There was   severe stenosis. - Aorta: There was mild non-mobile atheroma. - Ascending aorta: Mindy ascending aorta was normal in size. - Descending aorta: Mindy descending aorta was normal in size. - Mitral valve: S/p annuloplasty ring 28 mm. - Left atrium: Mindy atrium was dilated. No evidence of thrombus in   Mindy atrial cavity or appendage. - Right atrium: Mindy atrium was dilated. No evidence of thrombus in   Mindy atrial cavity or appendage. - Tricuspid valve: There was moderate regurgitation. - Pulmonary arteries: Systolic pressure was moderately increased.   PA peak pressure: 53 mm Hg (S). Impressions: - A bioprosthetic [facility] Ease 21 mm sits well in Mindy aortic   position. Leaflets open well. There is no central aortic   regurgitation or paravalvular leak. Transaortic gradients are   severely elevated with peak/mean 126/44 mmHg. Good leaflet   opening. Small ring. No obvious calcifications or pannus.Possible   prosthesis to Mindy mismatch.   S/p annuloplasty ring 28 mm. Normal transmitral gradients. No   mitral regurgitation or paravalvular leak.  Laboratory Data:  Chemistry Recent Labs Lab 12/12/16 0945  NA 139  K 4.1  CL 107  CO2 24  GLUCOSE 84  BUN 20  CREATININE 1.17*  CALCIUM 10.8*  GFRNONAA 44*  GFRAA 51*  ANIONGAP 8    No results for input(s): PROT, ALBUMIN, AST, ALT, ALKPHOS, BILITOT in Mindy last 168 hours. Hematology Recent Labs Lab 12/12/16 0945  WBC 5.0  RBC 3.98  HGB 12.3  HCT 37.3  MCV 93.7  MCH 30.9  MCHC 33.0  RDW 15.2  PLT 140*   Cardiac EnzymesNo results for input(s): TROPONINI in Mindy last 168 hours. No results for input(s): TROPIPOC in Mindy last 168 hours.  BNPNo results for input(s): BNP, PROBNP in Mindy last 168 hours.  DDimer No results for input(s): DDIMER in Mindy last 168 hours.  Radiology/Studies:  No results found.  Assessment and Plan:   1. Bradycardia, sinus pauses      Longstanding asymptomatic bradycardia     In d/w Dr. Jacinto Halim as noted above, variable degrees of AV block over Mindy years has been observed including CHB.     Post-op in April he felt that part of Mindy issue was lack of chronotropic  ability to aid in response to her anemia that she had persistent hypotension, feels now with pending hip surgery and potential needs for AV intervention of some kind, Mindy for PPM is reasonable     On no nodal blocking/rate limiting medicines   2. Severe AS (s/p bioprosthetic AVR 2013)     D/w Dr. Delton See, felt to be valve mismatch, leaflets opening well     Undecided how best to treat, Dr. Delton See reports ongoing conversation with Dr. Excell Seltzer and Dr. Jacinto Halim      not planned for cath at this time.         Mindy Mindy will be seen later today by Dr.Caitland Porchia.  She denies any exertional intolerances outside of hip pain, no hx of syncope or near syncope.  She has known variable heart block.  Likely will need pacing. Uncertain what kind of intervention for her aortic stenosis is planned for.            For questions or updates, please contact [facility] Please consult [email or web address] for contact info under Cardiology/STEMI.   Signed, Sheilah Pigeon, PA-C  12/12/2016 2:15 PM  EP attending  Mindy seen and examined. Agree with Mindy findings as noted above. We are asked to see Mindy Mindy by Dr. Delton See for Mindy of sinus bradycardia which was symptomatic as an associated with hypotension during Mindy Mindy's TEE earlier today. She has not had syncope. I saw Mindy Mindy for years ago for symptomatic sinus node dysfunction but she was on sinus nodal blocking drugs and these were held and her heart rate improved. She has had long-standing sinus bradycardia but has been asymptomatic. She is symptomatic with regard to hip pain. She has fairly severe arthritis and is being evaluated for hip replacement surgery. Initial 2-D echo followed by TEE has demonstrated  severe aortic stenosis with a mean gradient of 44 mmHg in Mindy setting of preserved left ventricular systolic function and moderate pulmonary hypertension. Mindy Mindy is sedentary. She denies chest pain, syncope, or shortness of breath. She has not had peripheral edema. On exam she is a pleasant elderly appearing woman in no distress. Vitals are documented above. Her exam reveals a regular rate rhythm with a single S2. There is a very low pitched systolic murmur consistent with aortic stenosis. Lungs are clear, and Mindy extremities demonstrated no edema. ECG demonstrates sinus bradycardia with right atrial enlargement and left axis.  Mindy Mindy has a complicated situation. If hip replacement surgery could be avoided, I think her lack of symptoms would suggest that no surgery or pacemaker implantation be done. She states that her quality of life is very severely impaired as it hurts whenever she walks.Her pain has not been well-controlled. With her documented bradycardia, pacemaker insertion would be strongly considered if hip surgery were to be pursued. However her aortic stenosis would make her surgical risk for hip surgery very high. Reviewing her records suggest that TAVR might be more difficult because of a previously replaced aortic valve with a 21 mm valve. I will plan to discuss with our surgical structural heart colleagues. If hip surgery is not going to be recommended, I would avoid pacemaker at Mindy present time.  Lewayne Bunting, M.D.

## 2016-12-21 NOTE — Discharge Instructions (Signed)
° ° °  Supplemental Discharge Instructions for  Pacemaker/Defibrillator Patients  Activity No heavy lifting or vigorous activity with your left/right arm for 6 to 8 weeks.  Do not raise your left/right arm above your head for one week.  Gradually raise your affected arm as drawn below.           __         12/25/16                  12/26/16                      12/27/16                 12/28/16  NO DRIVING for  1 week   ; you may begin driving on 16/02/909/8/18    .  WOUND CARE - Keep the wound area clean and dry.  Do not get this area wet for one week. No showers for one week; you may shower on    12/28/16 . - The tape/steri-strips on your wound will fall off; do not pull them off.  No bandage is needed on the site.  DO  NOT apply any creams, oils, or ointments to the wound area. - If you notice any drainage or discharge from the wound, any swelling or bruising at the site, or you develop a fever > 101? F after you are discharged home, call the office at once.  Special Instructions - You are still able to use cellular telephones; use the ear opposite the side where you have your pacemaker/defibrillator.  Avoid carrying your cellular phone near your device. - When traveling through airports, show security personnel your identification card to avoid being screened in the metal detectors.  Ask the security personnel to use the hand wand. - Avoid arc welding equipment, TENS units (transcutaneous nerve stimulators).  Call the office for questions about other devices. - Avoid electrical appliances that are in poor condition or are not properly grounded. - Microwave ovens are safe to be near or to operate.

## 2016-12-22 ENCOUNTER — Encounter (HOSPITAL_COMMUNITY): Payer: Self-pay | Admitting: Internal Medicine

## 2016-12-22 ENCOUNTER — Ambulatory Visit (HOSPITAL_COMMUNITY): Payer: Medicare Other

## 2016-12-22 DIAGNOSIS — I442 Atrioventricular block, complete: Secondary | ICD-10-CM | POA: Diagnosis not present

## 2016-12-22 DIAGNOSIS — I509 Heart failure, unspecified: Secondary | ICD-10-CM | POA: Diagnosis not present

## 2016-12-22 DIAGNOSIS — I495 Sick sinus syndrome: Secondary | ICD-10-CM | POA: Diagnosis not present

## 2016-12-22 DIAGNOSIS — Z23 Encounter for immunization: Secondary | ICD-10-CM | POA: Diagnosis not present

## 2016-12-22 DIAGNOSIS — I110 Hypertensive heart disease with heart failure: Secondary | ICD-10-CM | POA: Diagnosis not present

## 2016-12-22 NOTE — Care Management Note (Addendum)
Case Management Note  Patient Details  Name: [name] MRN: [id number] Date of Birth: 02/13/1941  Subjective/Objective:   From home alone, s/p pace maker implant, for dc to home today, she may need HHRN.  She will need medication ast at home at dc.  [facility]  Soc will begin 24-48 hrs post dc.  Patient's son , Clide CliffRicky will be able to be with her a couple of days, he is not sure which day but will check with his supervisor.                Action/Plan: DC home today with HHRN with [facility].   Expected Discharge Date:  12/22/16               Expected Discharge Plan:  Home/Self Care  In-House Referral:     Discharge planning Services  CM Consult  Post Acute Care Choice:    Choice offered to:     DME Arranged:    DME Agency:     HH Arranged:   HHRN HH Agency:   [facility]  Status of Service:  Completed, signed off  If discussed at Long Length of Stay Meetings, dates discussed:    Additional Comments:  Leone Havenaylor, Disa Riedlinger [place], RN 12/22/2016, 9:18 AM

## 2016-12-26 ENCOUNTER — Telehealth: Payer: Self-pay | Admitting: Internal Medicine

## 2016-12-26 NOTE — Telephone Encounter (Signed)
New Message     Pt is calling stating that her home health service with [facility] was not started and they said they did not receive order from Dr Ladona Ridgelaylor to have service

## 2016-12-26 NOTE — Telephone Encounter (Signed)
Per [facility] they have the referral and they should be in contact with the patient in the next day or two. Patient made aware.

## 2016-12-29 DIAGNOSIS — Z48812 Encounter for surgical aftercare following surgery on the circulatory system: Secondary | ICD-10-CM | POA: Diagnosis not present

## 2017-01-02 ENCOUNTER — Ambulatory Visit (INDEPENDENT_AMBULATORY_CARE_PROVIDER_SITE_OTHER): Payer: Medicare Other | Admitting: *Deleted

## 2017-01-02 DIAGNOSIS — I495 Sick sinus syndrome: Secondary | ICD-10-CM | POA: Diagnosis not present

## 2017-01-02 LAB — CUP PACEART INCLINIC DEVICE CHECK
Battery Voltage: 3.2 V
Brady Statistic AP VP Percent: 2.83 %
Date Time Interrogation Session: 20181113165436
Implantable Lead Implant Date: 20181101
Implantable Lead Location: 753859
Implantable Pulse Generator Implant Date: 20181101
Lead Channel Impedance Value: 342 Ohm
Lead Channel Impedance Value: 456 Ohm
Lead Channel Impedance Value: 646 Ohm
Lead Channel Pacing Threshold Amplitude: 0.5 V
Lead Channel Pacing Threshold Pulse Width: 0.4 ms
Lead Channel Setting Pacing Amplitude: 3.5 V
Lead Channel Setting Sensing Sensitivity: 1.2 mV
MDC IDC LEAD IMPLANT DT: 20181101
MDC IDC LEAD LOCATION: 753860
MDC IDC MSMT BATTERY REMAINING LONGEVITY: 145 mo
MDC IDC MSMT LEADCHNL RA IMPEDANCE VALUE: 475 Ohm
MDC IDC MSMT LEADCHNL RA PACING THRESHOLD PULSEWIDTH: 0.4 ms
MDC IDC MSMT LEADCHNL RA SENSING INTR AMPL: 3.625 mV
MDC IDC MSMT LEADCHNL RA SENSING INTR AMPL: 5.25 mV
MDC IDC MSMT LEADCHNL RV PACING THRESHOLD AMPLITUDE: 0.5 V
MDC IDC MSMT LEADCHNL RV SENSING INTR AMPL: 16.875 mV
MDC IDC MSMT LEADCHNL RV SENSING INTR AMPL: 17.25 mV
MDC IDC SET LEADCHNL RV PACING AMPLITUDE: 3.5 V
MDC IDC SET LEADCHNL RV PACING PULSEWIDTH: 0.4 ms
MDC IDC STAT BRADY AP VS PERCENT: 93.41 %
MDC IDC STAT BRADY AS VP PERCENT: 0.04 %
MDC IDC STAT BRADY AS VS PERCENT: 3.72 %
MDC IDC STAT BRADY RA PERCENT PACED: 97.11 %
MDC IDC STAT BRADY RV PERCENT PACED: 2.87 %

## 2017-01-02 NOTE — Progress Notes (Signed)
Wound check appointment. Steri-strips removed. Wound without redness or edema. Incision edges approximated, wound well healed. Normal device function. Thresholds, sensing, and impedances consistent with implant measurements. Device programmed at 3.5V for extra safety margin until 3 month visit. HIS lead appears to be septal capture from 5V down to LOC on EKG rhythm strip. Histogram distribution appropriate for patient and level of activity. No mode switches. 1 high ventricular rate noted- 11 beats NSVT. Patient educated about wound care, arm mobility, lifting restrictions. ROV with GT 03/27/17. [name] will be followed by Dr. Jacinto HalimGanji remotely and after 28mo appt with GT.

## 2017-01-26 ENCOUNTER — Ambulatory Visit: Payer: Self-pay | Admitting: Orthopedic Surgery

## 2017-02-04 ENCOUNTER — Ambulatory Visit: Payer: Self-pay | Admitting: Orthopedic Surgery

## 2017-02-04 NOTE — H&P (Signed)
TOTAL HIP ADMISSION H&P  Patient is admitted for left total hip arthroplasty.  Subjective:  Chief Complaint: left hip pain  HPI: [name], 76 y.o. female, has a history of pain and functional disability in the left hip(s) due to arthritis and patient has failed non-surgical conservative treatments for greater than 12 weeks to include NSAID's and/or analgesics, flexibility and strengthening excercises, supervised PT with diminished ADL's post treatment, use of assistive devices, weight reduction as appropriate and activity modification.  Onset of symptoms was gradual starting >10 years ago with gradually worsening course since that time.The patient noted no past surgery on the left hip(s).  Patient currently rates pain in the left hip at 10 out of 10 with activity. Patient has night pain, worsening of pain with activity and weight bearing, pain that interfers with activities of daily living, pain with passive range of motion and crepitus. Patient has evidence of subchondral cysts, subchondral sclerosis, periarticular osteophytes, joint subluxation and joint space narrowing by imaging studies. This condition presents safety issues increasing the risk of falls.   There is no current active infection.  Patient Active Problem List   Diagnosis Date Noted  . Sinus node dysfunction (HCC) 12/21/2016  . Chronic diastolic CHF (congestive heart failure) (HCC)   . 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 Anasarca 08/21/2011  . Hypokalemia 08/21/2011  . Hypoxia 08/18/2011  . Hypertension    Past Medical History:  Diagnosis Date  . Aortic stenosis   . Arthritis    "hips" (12/21/2016)  . Atrial fibrillation [facility])    Status post cardioversion November 2013  . Chronic diastolic CHF (congestive heart failure) (HCC)   . History of blood transfusion 05/2016   "when I had my hip replaced" (12/21/2016)  .  Hypertension   . Shortness of breath    hx dyspnea and respiratory abnormalities    Past Surgical History:  Procedure Laterality Date  . AORTIC VALVE REPLACEMENT  10/12/2011   Procedure: AORTIC VALVE REPLACEMENT (AVR);  Surgeon: Alleen Borne, MD;  Location: [facility] OR;  Service: Open Heart Surgery;  Laterality: N/A;  . CARDIAC CATHETERIZATION     "I've had several; no OR" (12/21/2016)  . CARDIAC VALVE REPLACEMENT    . CARDIOVERSION  12/26/2011   Procedure: CARDIOVERSION;  Surgeon: Pamella Pert, MD;  Location: [facility] ENDOSCOPY;  Service: Cardiovascular;  Laterality: N/A;  . CORONARY ANGIOGRAPHY N/A 09/26/2016   Procedure: CORONARY ANGIOGRAPHY;  Surgeon: Elder Negus, MD;  Location: [facility] INVASIVE CV LAB;  Service: Cardiovascular;  Laterality: N/A;  . EXPLORATION POST OPERATIVE OPEN HEART  10/12/2011   Procedure: EXPLORATION POST OPERATIVE OPEN HEART;  Surgeon: Alleen Borne, MD;  Location: [facility] OR;  Service: Open Heart Surgery;  Laterality: N/A;  . INSERT / REPLACE / REMOVE PACEMAKER  12/21/2016  . JOINT REPLACEMENT    . LEFT AND RIGHT HEART CATHETERIZATION WITH CORONARY ANGIOGRAM N/A 09/11/2011   Procedure: LEFT AND RIGHT HEART CATHETERIZATION WITH CORONARY ANGIOGRAM;  Surgeon: Pamella Pert, MD;  Location: [facility] CATH LAB;  Service: Cardiovascular;  Laterality: N/A;  . MITRAL VALVE REPAIR  10/12/2011   Procedure: MITRAL VALVE REPAIR (MVR);  Surgeon: Alleen Borne, MD;  Location: [facility] OR;  Service: Open Heart Surgery;  Laterality: N/A;  mitral valve annuloplasty  . PACEMAKER IMPLANT N/A 12/21/2016   Procedure: PACEMAKER IMPLANT;  Surgeon: Marinus Maw, MD;  Location: [facility] INVASIVE CV LAB;  Service: Cardiovascular;  Laterality: N/A;  . RIGHT HEART CATH N/A 09/26/2016   Procedure: RIGHT HEART CATH;  Surgeon: Elder NegusPatwardhan, Manish J, MD;  Location: [facility] INVASIVE CV LAB;  Service: Cardiovascular;  Laterality: N/A;  . TEE WITHOUT CARDIOVERSION N/A 08/22/2016   Procedure: TRANSESOPHAGEAL ECHOCARDIOGRAM (TEE);   Surgeon: Yates DecampGanji, Jay, MD;  [facility];  Service: Cardiovascular;  Laterality: N/A;  . TEE WITHOUT CARDIOVERSION N/A 12/12/2016   Procedure: TRANSESOPHAGEAL ECHOCARDIOGRAM (TEE);  Surgeon: Lars MassonNelson, Katarina H, MD;  [facility];  Service: Cardiovascular;  Laterality: N/A;  . TOTAL HIP ARTHROPLASTY Right 06/15/2016   Procedure: RIGHT TOTAL HIP ARTHROPLASTY ANTERIOR APPROACH;  Surgeon: Samson FredericBrian Axl Rodino, MD;  Location: [facility] ORS;  Service: Orthopedics;  Laterality: Right;  Dr. needs RNFA    Current Outpatient Medications  Medication Sig Dispense Refill Last Dose  . amLODipine (NORVASC) 10 MG tablet Take 10 mg by mouth daily.   12/20/2016 at Unknown time  . aspirin 81 MG chewable tablet Chew 1 tablet (81 mg total) by mouth 2 (two) times daily. 60 tablet 1 12/20/2016 at Unknown time  . Menthol, Topical Analgesic, (ICY HOT EX) Apply 1 application topically 3 (three) times daily as needed (for pain.).   Past Month at Unknown time  . Multiple Vitamin (MULTIVITAMIN WITH MINERALS) TABS tablet Take 1 tablet by mouth daily.   12/20/2016 at Unknown time  . naproxen sodium (ALEVE) 220 MG tablet Take 440 mg by mouth 2 (two) times daily.     Marland Kitchen. spironolactone (ALDACTONE) 25 MG tablet Take 25 mg by mouth daily.   12/20/2016 at Unknown time  . traMADol (ULTRAM) 50 MG tablet Take 50 mg by mouth every 6 (six) hours as needed for moderate pain.    12/20/2016 at Unknown time  . vitamin C (ASCORBIC ACID) 500 MG tablet Take 500 mg by mouth daily.   12/20/2016 at Unknown time   No current facility-administered medications for this visit.    No Known Allergies  Social History   Tobacco Use  . Smoking status: Never Smoker  . Smokeless tobacco: Never Used  Substance Use Topics  . Alcohol use: No    Family History  Problem Relation Age of Onset  . Hypertension Mother      Review of Systems  Constitutional: Negative.   HENT: Negative.   Eyes: Negative.   Respiratory: Negative.   Cardiovascular:  Negative.   Gastrointestinal: Negative.   Genitourinary: Negative.   Musculoskeletal: Positive for joint pain.  Skin: Negative.   Neurological: Negative.   Endo/Heme/Allergies: Negative.   Psychiatric/Behavioral: Negative.     Objective:  Physical Exam  Vitals reviewed. Constitutional: She is oriented to person, place, and time. She appears well-developed and well-nourished.  HENT:  Head: Normocephalic and atraumatic.  Eyes: Conjunctivae and EOM are normal. Pupils are equal, round, and reactive to light.  Neck: Normal range of motion. Neck supple.  Cardiovascular: Normal rate and intact distal pulses.  Respiratory: Effort normal. No respiratory distress.  GI: Soft. Bowel sounds are normal.  Genitourinary:  Genitourinary Comments: deferred  Musculoskeletal:       Left hip: She exhibits decreased range of motion, decreased strength and bony tenderness.  Neurological: She is alert and oriented to person, place, and time. She has normal reflexes.  Skin: Skin is warm and dry.  Psychiatric: She has a normal mood and affect. Her behavior is normal. Judgment and thought content normal.    Vital signs in last 24 hours: @VSRANGES @  Labs:   Estimated body  mass index is 21.8 kg/m as calculated from the following:   Height as of 12/21/16: 5' 4.5" (1.638 m).   Weight as of 12/22/16: 58.5 kg (129 lb).   Imaging Review Plain radiographs demonstrate severe degenerative joint disease of the left hip(s). The bone quality appears to be inadequate for age and reported activity level.  Assessment/Plan:  End stage arthritis, left hip(s)  The patient history, physical examination, clinical judgement of the provider and imaging studies are consistent with end stage degenerative joint disease of the left hip(s) and total hip arthroplasty is deemed medically necessary. The treatment options including medical management, injection therapy, arthroscopy and arthroplasty were discussed at length.  The risks and benefits of total hip arthroplasty were presented and reviewed. The risks due to aseptic loosening, infection, stiffness, dislocation/subluxation,  thromboembolic complications and other imponderables were discussed.  The patient acknowledged the explanation, agreed to proceed with the plan and consent was signed. Patient is being admitted for inpatient treatment for surgery, pain control, PT, OT, prophylactic antibiotics, VTE prophylaxis, progressive ambulation and ADL's and discharge planning.The patient is planning to be discharged home with HEP

## 2017-02-04 NOTE — H&P (View-Only) (Signed)
TOTAL HIP ADMISSION H&P  Patient is admitted for left total hip arthroplasty.  Subjective:  Chief Complaint: left hip pain  HPI: [name], 76 y.o. female, has a history of pain and functional disability in the left hip(s) due to arthritis and patient has failed non-surgical conservative treatments for greater than 12 weeks to include NSAID's and/or analgesics, flexibility and strengthening excercises, supervised PT with diminished ADL's post treatment, use of assistive devices, weight reduction as appropriate and activity modification.  Onset of symptoms was gradual starting >10 years ago with gradually worsening course since that time.The patient noted no past surgery on the left hip(s).  Patient currently rates pain in the left hip at 10 out of 10 with activity. Patient has night pain, worsening of pain with activity and weight bearing, pain that interfers with activities of daily living, pain with passive range of motion and crepitus. Patient has evidence of subchondral cysts, subchondral sclerosis, periarticular osteophytes, joint subluxation and joint space narrowing by imaging studies. This condition presents safety issues increasing the risk of falls.   There is no current active infection.  Patient Active Problem List   Diagnosis Date Noted  . Sinus node dysfunction (HCC) 12/21/2016  . Chronic diastolic CHF (congestive heart failure) (HCC)   . 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 Anasarca 08/21/2011  . Hypokalemia 08/21/2011  . Hypoxia 08/18/2011  . Hypertension    Past Medical History:  Diagnosis Date  . Aortic stenosis   . Arthritis    "hips" (12/21/2016)  . Atrial fibrillation [facility])    Status post cardioversion November 2013  . Chronic diastolic CHF (congestive heart failure) (HCC)   . History of blood transfusion 05/2016   "when I had my hip replaced" (12/21/2016)  .  Hypertension   . Shortness of breath    hx dyspnea and respiratory abnormalities    Past Surgical History:  Procedure Laterality Date  . AORTIC VALVE REPLACEMENT  10/12/2011   Procedure: AORTIC VALVE REPLACEMENT (AVR);  Surgeon: Alleen Borne, MD;  Location: [facility] OR;  Service: Open Heart Surgery;  Laterality: N/A;  . CARDIAC CATHETERIZATION     "I've had several; no OR" (12/21/2016)  . CARDIAC VALVE REPLACEMENT    . CARDIOVERSION  12/26/2011   Procedure: CARDIOVERSION;  Surgeon: Pamella Pert, MD;  Location: [facility] ENDOSCOPY;  Service: Cardiovascular;  Laterality: N/A;  . CORONARY ANGIOGRAPHY N/A 09/26/2016   Procedure: CORONARY ANGIOGRAPHY;  Surgeon: Elder Negus, MD;  Location: [facility] INVASIVE CV LAB;  Service: Cardiovascular;  Laterality: N/A;  . EXPLORATION POST OPERATIVE OPEN HEART  10/12/2011   Procedure: EXPLORATION POST OPERATIVE OPEN HEART;  Surgeon: Alleen Borne, MD;  Location: [facility] OR;  Service: Open Heart Surgery;  Laterality: N/A;  . INSERT / REPLACE / REMOVE PACEMAKER  12/21/2016  . JOINT REPLACEMENT    . LEFT AND RIGHT HEART CATHETERIZATION WITH CORONARY ANGIOGRAM N/A 09/11/2011   Procedure: LEFT AND RIGHT HEART CATHETERIZATION WITH CORONARY ANGIOGRAM;  Surgeon: Pamella Pert, MD;  Location: [facility] CATH LAB;  Service: Cardiovascular;  Laterality: N/A;  . MITRAL VALVE REPAIR  10/12/2011   Procedure: MITRAL VALVE REPAIR (MVR);  Surgeon: Alleen Borne, MD;  Location: [facility] OR;  Service: Open Heart Surgery;  Laterality: N/A;  mitral valve annuloplasty  . PACEMAKER IMPLANT N/A 12/21/2016   Procedure: PACEMAKER IMPLANT;  Surgeon: Marinus Maw, MD;  Location: [facility] INVASIVE CV LAB;  Service: Cardiovascular;  Laterality: N/A;  . RIGHT HEART CATH N/A 09/26/2016   Procedure: RIGHT HEART CATH;  Surgeon: Elder NegusPatwardhan, Manish J, MD;  Location: [facility] INVASIVE CV LAB;  Service: Cardiovascular;  Laterality: N/A;  . TEE WITHOUT CARDIOVERSION N/A 08/22/2016   Procedure: TRANSESOPHAGEAL ECHOCARDIOGRAM (TEE);   Surgeon: Yates DecampGanji, Jay, MD;  [facility];  Service: Cardiovascular;  Laterality: N/A;  . TEE WITHOUT CARDIOVERSION N/A 12/12/2016   Procedure: TRANSESOPHAGEAL ECHOCARDIOGRAM (TEE);  Surgeon: Lars MassonNelson, Katarina H, MD;  [facility];  Service: Cardiovascular;  Laterality: N/A;  . TOTAL HIP ARTHROPLASTY Right 06/15/2016   Procedure: RIGHT TOTAL HIP ARTHROPLASTY ANTERIOR APPROACH;  Surgeon: Samson FredericBrian Micajah Dennin, MD;  Location: [facility] ORS;  Service: Orthopedics;  Laterality: Right;  Dr. needs RNFA    Current Outpatient Medications  Medication Sig Dispense Refill Last Dose  . amLODipine (NORVASC) 10 MG tablet Take 10 mg by mouth daily.   12/20/2016 at Unknown time  . aspirin 81 MG chewable tablet Chew 1 tablet (81 mg total) by mouth 2 (two) times daily. 60 tablet 1 12/20/2016 at Unknown time  . Menthol, Topical Analgesic, (ICY HOT EX) Apply 1 application topically 3 (three) times daily as needed (for pain.).   Past Month at Unknown time  . Multiple Vitamin (MULTIVITAMIN WITH MINERALS) TABS tablet Take 1 tablet by mouth daily.   12/20/2016 at Unknown time  . naproxen sodium (ALEVE) 220 MG tablet Take 440 mg by mouth 2 (two) times daily.     Marland Kitchen. spironolactone (ALDACTONE) 25 MG tablet Take 25 mg by mouth daily.   12/20/2016 at Unknown time  . traMADol (ULTRAM) 50 MG tablet Take 50 mg by mouth every 6 (six) hours as needed for moderate pain.    12/20/2016 at Unknown time  . vitamin C (ASCORBIC ACID) 500 MG tablet Take 500 mg by mouth daily.   12/20/2016 at Unknown time   No current facility-administered medications for this visit.    No Known Allergies  Social History   Tobacco Use  . Smoking status: Never Smoker  . Smokeless tobacco: Never Used  Substance Use Topics  . Alcohol use: No    Family History  Problem Relation Age of Onset  . Hypertension Mother      Review of Systems  Constitutional: Negative.   HENT: Negative.   Eyes: Negative.   Respiratory: Negative.   Cardiovascular:  Negative.   Gastrointestinal: Negative.   Genitourinary: Negative.   Musculoskeletal: Positive for joint pain.  Skin: Negative.   Neurological: Negative.   Endo/Heme/Allergies: Negative.   Psychiatric/Behavioral: Negative.     Objective:  Physical Exam  Vitals reviewed. Constitutional: She is oriented to person, place, and time. She appears well-developed and well-nourished.  HENT:  Head: Normocephalic and atraumatic.  Eyes: Conjunctivae and EOM are normal. Pupils are equal, round, and reactive to light.  Neck: Normal range of motion. Neck supple.  Cardiovascular: Normal rate and intact distal pulses.  Respiratory: Effort normal. No respiratory distress.  GI: Soft. Bowel sounds are normal.  Genitourinary:  Genitourinary Comments: deferred  Musculoskeletal:       Left hip: She exhibits decreased range of motion, decreased strength and bony tenderness.  Neurological: She is alert and oriented to person, place, and time. She has normal reflexes.  Skin: Skin is warm and dry.  Psychiatric: She has a normal mood and affect. Her behavior is normal. Judgment and thought content normal.    Vital signs in last 24 hours: @VSRANGES @  Labs:   Estimated body  mass index is 21.8 kg/m as calculated from the following:   Height as of 12/21/16: 5' 4.5" (1.638 m).   Weight as of 12/22/16: 58.5 kg (129 lb).   Imaging Review Plain radiographs demonstrate severe degenerative joint disease of the left hip(s). The bone quality appears to be inadequate for age and reported activity level.  Assessment/Plan:  End stage arthritis, left hip(s)  The patient history, physical examination, clinical judgement of the provider and imaging studies are consistent with end stage degenerative joint disease of the left hip(s) and total hip arthroplasty is deemed medically necessary. The treatment options including medical management, injection therapy, arthroscopy and arthroplasty were discussed at length.  The risks and benefits of total hip arthroplasty were presented and reviewed. The risks due to aseptic loosening, infection, stiffness, dislocation/subluxation,  thromboembolic complications and other imponderables were discussed.  The patient acknowledged the explanation, agreed to proceed with the plan and consent was signed. Patient is being admitted for inpatient treatment for surgery, pain control, PT, OT, prophylactic antibiotics, VTE prophylaxis, progressive ambulation and ADL's and discharge planning.The patient is planning to be discharged home with HEP

## 2017-02-05 NOTE — Progress Notes (Addendum)
Clearance Dr. Jacinto HalimGanji 01-08-17 chart   ekg 01-02-17 epic  Last device check 01-02-17  Cath 09-26-16 chart

## 2017-02-05 NOTE — Patient Instructions (Signed)
Herbert Setarene R Mewborn  02/05/2017   Your procedure is scheduled on: 02-08-17  [facility]  Entrance [facility] [place]  elevators to 3rd floor to  Short Stay Center at     [phone].    Call this number if you have problems the morning of surgery [phone]    Remember: ONLY 1 PERSON MAY GO WITH YOU TO SHORT STAY TO GET  READY MORNING OF YOUR SURGERY.  Do not eat food or drink liquids :After Midnight.     Take these medicines the morning of surgery with A SIP OF WATER: amlodipine                                You may not have any metal on your body including hair pins and              piercings  Do not wear jewelry, make-up, lotions, powders or perfumes, deodorant             Do not wear nail polish.  Do not shave  48 hours prior to surgery.            Do not bring valuables to the hospital. [facility] IS NOT             RESPONSIBLE   FOR VALUABLES.  Contacts, dentures or bridgework may not be worn into surgery.  Leave suitcase in the car. After surgery it may be brought to your room.      Special Instructions: N/A              Please read over the following fact sheets you were given: _____________________________________________________________________          [facility] - Preparing [facility] Before surgery, you can play an important role.  Because skin is not sterile, your skin needs to be as free of germs as possible.  You can reduce the number of germs on your skin by washing with CHG (chlorahexidine gluconate) soap before surgery.  CHG is an antiseptic cleaner which kills germs and bonds with the skin to continue killing germs even after washing. Please DO NOT use if you have an allergy to CHG or antibacterial soaps.  If your skin becomes reddened/irritated stop using the CHG and inform your nurse when you arrive at Short Stay. Do not shave (including legs and underarms) for at least 48 hours prior to the first CHG shower.  You may shave your  face/neck. Please follow these instructions carefully:  1.  Shower with CHG Soap the night before surgery and the  morning of Surgery.  2.  If you choose to wash your hair, wash your hair first as usual with your  normal  shampoo.  3.  After you shampoo, rinse your hair and body thoroughly to remove the  shampoo.                           4.  Use CHG as you would any other liquid soap.  You can apply chg directly  to the skin and wash                       Gently with a scrungie or clean washcloth.  5.  Apply the CHG Soap to your body  ONLY FROM THE NECK DOWN.   Do not use on face/ open                           Wound or open sores. Avoid contact with eyes, ears mouth and genitals (private parts).                       Wash face,  Genitals (private parts) with your normal soap.             6.  Wash thoroughly, paying special attention to the area where your surgery  will be performed.  7.  Thoroughly rinse your body with warm water from the neck down.  8.  DO NOT shower/wash with your normal soap after using and rinsing off  the CHG Soap.                9.  Pat yourself dry with a clean towel.            10.  Wear clean pajamas.            11.  Place clean sheets on your bed the night of your first shower and do not  sleep with pets. Day of Surgery : Do not apply any lotions/deodorants the morning of surgery.  Please wear clean clothes to the hospital/surgery center.  FAILURE TO FOLLOW THESE INSTRUCTIONS MAY RESULT IN THE CANCELLATION OF YOUR SURGERY PATIENT SIGNATURE_________________________________  NURSE SIGNATURE__________________________________  ________________________________________________________________________  WHAT IS A BLOOD TRANSFUSION? Blood Transfusion Information  A transfusion is the replacement of blood or some of its parts. Blood is made up of multiple cells which provide different functions.  Red blood cells carry oxygen and are used for blood loss  replacement.  White blood cells fight against infection.  Platelets control bleeding.  Plasma helps clot blood.  Other blood products are available for specialized needs, such as hemophilia or other clotting disorders. BEFORE THE TRANSFUSION  Who gives blood for transfusions?   Healthy volunteers who are fully evaluated to make sure their blood is safe. This is blood bank blood. Transfusion therapy is the safest it has ever been in the practice of medicine. Before blood is taken from a donor, a complete history is taken to make sure that person has no history of diseases nor engages in risky social behavior (examples are intravenous drug use or sexual activity with multiple partners). The donor's travel history is screened to minimize risk of transmitting infections, such as malaria. The donated blood is tested for signs of infectious diseases, such as HIV and hepatitis. The blood is then tested to be sure it is compatible with you in order to minimize the chance of a transfusion reaction. If you or a relative donates blood, this is often done in anticipation of surgery and is not appropriate for emergency situations. It takes many days to process the donated blood. RISKS AND COMPLICATIONS Although transfusion therapy is very safe and saves many lives, the main dangers of transfusion include:   Getting an infectious disease.  Developing a transfusion reaction. This is an allergic reaction to something in the blood you were given. Every precaution is taken to prevent this. The decision to have a blood transfusion has been considered carefully by your caregiver before blood is given. Blood is not given unless the benefits outweigh the risks. AFTER THE TRANSFUSION  Right after receiving a blood transfusion, you will usually feel much  better and more energetic. This is especially true if your red blood cells have gotten low (anemic). The transfusion raises the level of the red blood cells which  carry oxygen, and this usually causes an energy increase.  The nurse administering the transfusion will monitor you carefully for complications. HOME CARE INSTRUCTIONS  No special instructions are needed after a transfusion. You may find your energy is better. Speak with your caregiver about any limitations on activity for underlying diseases you may have. SEEK MEDICAL CARE IF:   Your condition is not improving after your transfusion.  You develop redness or irritation at the intravenous (IV) site. SEEK IMMEDIATE MEDICAL CARE IF:  Any of the following symptoms occur over the next 12 hours:  Shaking chills.  You have a temperature by mouth above 102 F (38.9 C), not controlled by medicine.  Chest, back, or muscle pain.  People around you feel you are not acting correctly or are confused.  Shortness of breath or difficulty breathing.  Dizziness and fainting.  You get a rash or develop hives.  You have a decrease in urine output.  Your urine turns a dark color or changes to pink, red, or brown. Any of the following symptoms occur over the next 10 days:  You have a temperature by mouth above 102 F (38.9 C), not controlled by medicine.  Shortness of breath.  Weakness after normal activity.  The white part of the eye turns yellow (jaundice).  You have a decrease in the amount of urine or are urinating less often.  Your urine turns a dark color or changes to pink, red, or brown. Document Released: 02/04/2000 Document Revised: 05/01/2011 Document Reviewed: 09/23/2007 ExitCare Patient Information 2014 [place].  _______________________________________________________________________  Incentive Spirometer  An incentive spirometer is a tool that can help keep your lungs clear and active. This tool measures how well you are filling your lungs with each breath. Taking long deep breaths may help reverse or decrease the chance of developing breathing (pulmonary) problems  (especially infection) following:  A long period of time when you are unable to move or be active. BEFORE THE PROCEDURE   If the spirometer includes an indicator to show your best effort, your nurse or respiratory therapist will set it to a desired goal.  If possible, sit up straight or lean slightly forward. Try not to slouch.  Hold the incentive spirometer in an upright position. INSTRUCTIONS FOR USE  1. Sit on the edge of your bed if possible, or sit up as far as you can in bed or on a chair. 2. Hold the incentive spirometer in an upright position. 3. Breathe out normally. 4. Place the mouthpiece in your mouth and seal your lips tightly around it. 5. Breathe in slowly and as deeply as possible, raising the piston or the ball toward the top of the column. 6. Hold your breath for 3-5 seconds or for as long as possible. Allow the piston or ball to fall to the bottom of the column. 7. Remove the mouthpiece from your mouth and breathe out normally. 8. Rest for a few seconds and repeat Steps 1 through 7 at least 10 times every 1-2 hours when you are awake. Take your time and take a few normal breaths between deep breaths. 9. The spirometer may include an indicator to show your best effort. Use the indicator as a goal to work toward during each repetition. 10. After each set of 10 deep breaths, practice coughing to be sure  your lungs are clear. If you have an incision (the cut made at the time of surgery), support your incision when coughing by placing a pillow or rolled up towels firmly against it. Once you are able to get out of bed, walk around indoors and cough well. You may stop using the incentive spirometer when instructed by your caregiver.  RISKS AND COMPLICATIONS  Take your time so you do not get dizzy or light-headed.  If you are in pain, you may need to take or ask for pain medication before doing incentive spirometry. It is harder to take a deep breath if you are having  pain. AFTER USE  Rest and breathe slowly and easily.  It can be helpful to keep track of a log of your progress. Your caregiver can provide you with a simple table to help with this. If you are using the spirometer at home, follow these instructions: [place] IF:   You are having difficultly using the spirometer.  You have trouble using the spirometer as often as instructed.  Your pain medication is not giving enough relief while using the spirometer.  You develop fever of 100.5 F (38.1 C) or higher. SEEK IMMEDIATE MEDICAL CARE IF:   You cough up bloody sputum that had not been present before.  You develop fever of 102 F (38.9 C) or greater.  You develop worsening pain at or near the incision site. MAKE SURE YOU:   Understand these instructions.  Will watch your condition.  Will get help right away if you are not doing well or get worse. Document Released: 06/19/2006 Document Revised: 05/01/2011 Document Reviewed: 08/20/2006 [facility] Patient Information 2014 [place], [place].   ________________________________________________________________________

## 2017-02-06 ENCOUNTER — Encounter (HOSPITAL_COMMUNITY): Payer: Self-pay

## 2017-02-06 ENCOUNTER — Other Ambulatory Visit: Payer: Self-pay

## 2017-02-06 ENCOUNTER — Encounter (INDEPENDENT_AMBULATORY_CARE_PROVIDER_SITE_OTHER): Payer: Self-pay

## 2017-02-06 ENCOUNTER — Encounter (HOSPITAL_COMMUNITY)
Admission: RE | Admit: 2017-02-06 | Discharge: 2017-02-06 | Disposition: A | Discharge status: Home / Self Care | Payer: Medicare Other | Source: Ambulatory Visit | Attending: Orthopedic Surgery | Admitting: Orthopedic Surgery

## 2017-02-06 DIAGNOSIS — Z01812 Encounter for preprocedural laboratory examination: Secondary | ICD-10-CM | POA: Insufficient documentation

## 2017-02-06 DIAGNOSIS — M1612 Unilateral primary osteoarthritis, left hip: Secondary | ICD-10-CM

## 2017-02-06 HISTORY — DX: Chronic kidney disease, unspecified: N18.9

## 2017-02-06 HISTORY — DX: Adverse effect of unspecified anesthetic, initial encounter: T41.45XA

## 2017-02-06 HISTORY — DX: Other complications of anesthesia, initial encounter: T88.59XA

## 2017-02-06 HISTORY — DX: Presence of cardiac pacemaker: Z95.0

## 2017-02-06 LAB — BASIC METABOLIC PANEL
Anion gap: 8 (ref 5–15)
BUN: 25 mg/dL — AB (ref 6–20)
CO2: 27 mmol/L (ref 22–32)
CREATININE: 1.08 mg/dL — AB (ref 0.44–1.00)
Calcium: 10.6 mg/dL — ABNORMAL HIGH (ref 8.9–10.3)
Chloride: 102 mmol/L (ref 101–111)
GFR, EST AFRICAN AMERICAN: 56 mL/min — AB (ref >60)
GFR, EST NON AFRICAN AMERICAN: 48 mL/min — AB (ref >60)
Glucose, Bld: 91 mg/dL (ref 65–99)
POTASSIUM: 4.6 mmol/L (ref 3.5–5.1)
SODIUM: 137 mmol/L (ref 135–145)

## 2017-02-06 LAB — CBC
HEMATOCRIT: 33.7 % — AB (ref 36.0–46.0)
Hemoglobin: 11.2 g/dL — ABNORMAL LOW (ref 12.0–15.0)
MCH: 31.8 pg (ref 26.0–34.0)
MCHC: 33.2 g/dL (ref 30.0–36.0)
MCV: 95.7 fL (ref 78.0–100.0)
PLATELETS: 139 10*3/uL — AB (ref 150–400)
RBC: 3.52 MIL/uL — AB (ref 3.87–5.11)
RDW: 14.5 % (ref 11.5–15.5)
WBC: 4.7 10*3/uL (ref 4.0–10.5)

## 2017-02-06 LAB — SURGICAL PCR SCREEN
MRSA, PCR: NEGATIVE
STAPHYLOCOCCUS AUREUS: NEGATIVE

## 2017-02-07 ENCOUNTER — Encounter (HOSPITAL_COMMUNITY): Payer: Self-pay | Admitting: Certified Registered Nurse Anesthetist

## 2017-02-08 ENCOUNTER — Inpatient Hospital Stay (HOSPITAL_COMMUNITY): Payer: Medicare Other | Admitting: Certified Registered Nurse Anesthetist

## 2017-02-08 ENCOUNTER — Inpatient Hospital Stay (HOSPITAL_COMMUNITY): Payer: Medicare Other

## 2017-02-08 ENCOUNTER — Encounter (HOSPITAL_COMMUNITY)
Admission: RE | Disposition: A | Discharge status: Home / Self Care | Payer: Self-pay | Source: Ambulatory Visit | Attending: Orthopedic Surgery

## 2017-02-08 ENCOUNTER — Other Ambulatory Visit: Payer: Self-pay

## 2017-02-08 ENCOUNTER — Encounter (HOSPITAL_COMMUNITY): Payer: Self-pay

## 2017-02-08 ENCOUNTER — Inpatient Hospital Stay (HOSPITAL_COMMUNITY)
Admission: RE | Admit: 2017-02-08 | Discharge: 2017-02-10 | DRG: 470 | Disposition: A | Discharge status: Home / Self Care | Payer: Medicare Other | Source: Ambulatory Visit | Attending: Orthopedic Surgery | Admitting: Orthopedic Surgery

## 2017-02-08 DIAGNOSIS — M1612 Unilateral primary osteoarthritis, left hip: Secondary | ICD-10-CM | POA: Diagnosis not present

## 2017-02-08 DIAGNOSIS — I5032 Chronic diastolic (congestive) heart failure: Secondary | ICD-10-CM | POA: Diagnosis not present

## 2017-02-08 DIAGNOSIS — Z96641 Presence of right artificial hip joint: Secondary | ICD-10-CM | POA: Diagnosis not present

## 2017-02-08 DIAGNOSIS — Z7982 Long term (current) use of aspirin: Secondary | ICD-10-CM

## 2017-02-08 DIAGNOSIS — Z950 Presence of cardiac pacemaker: Secondary | ICD-10-CM

## 2017-02-08 DIAGNOSIS — Z79899 Other long term (current) drug therapy: Secondary | ICD-10-CM | POA: Diagnosis not present

## 2017-02-08 DIAGNOSIS — Z952 Presence of prosthetic heart valve: Secondary | ICD-10-CM | POA: Diagnosis not present

## 2017-02-08 DIAGNOSIS — I130 Hypertensive heart and chronic kidney disease with heart failure and stage 1 through stage 4 chronic kidney disease, or unspecified chronic kidney disease: Secondary | ICD-10-CM | POA: Diagnosis not present

## 2017-02-08 DIAGNOSIS — Z8249 Family history of ischemic heart disease and other diseases of the circulatory system: Secondary | ICD-10-CM

## 2017-02-08 DIAGNOSIS — M25752 Osteophyte, left hip: Secondary | ICD-10-CM | POA: Diagnosis present

## 2017-02-08 DIAGNOSIS — N183 Chronic kidney disease, stage 3 (moderate): Secondary | ICD-10-CM | POA: Diagnosis present

## 2017-02-08 DIAGNOSIS — Z419 Encounter for procedure for purposes other than remedying health state, unspecified: Secondary | ICD-10-CM

## 2017-02-08 DIAGNOSIS — M25552 Pain in left hip: Secondary | ICD-10-CM | POA: Diagnosis present

## 2017-02-08 DIAGNOSIS — Z79891 Long term (current) use of opiate analgesic: Secondary | ICD-10-CM

## 2017-02-08 DIAGNOSIS — Z09 Encounter for follow-up examination after completed treatment for conditions other than malignant neoplasm: Secondary | ICD-10-CM

## 2017-02-08 HISTORY — PX: TOTAL HIP ARTHROPLASTY: SHX124

## 2017-02-08 SURGERY — ARTHROPLASTY, HIP, TOTAL, ANTERIOR APPROACH
Anesthesia: General | Site: Hip | Laterality: Left

## 2017-02-08 MED ORDER — SODIUM CHLORIDE 0.9 % IR SOLN
Status: DC | PRN
  Administered 2017-02-08: 3000 mL

## 2017-02-08 MED ORDER — SUGAMMADEX SODIUM 200 MG/2ML IV SOLN
INTRAVENOUS | Status: DC | PRN
  Administered 2017-02-08: 150 mg via INTRAVENOUS

## 2017-02-08 MED ORDER — WATER FOR IRRIGATION, STERILE IR SOLN
Status: DC | PRN
  Administered 2017-02-08: 2000 mL

## 2017-02-08 MED ORDER — SODIUM CHLORIDE 0.9 % IJ SOLN
INTRAMUSCULAR | Status: DC | PRN
  Administered 2017-02-08: 30 mL

## 2017-02-08 MED ORDER — ACETAMINOPHEN 10 MG/ML IV SOLN
1000.0000 mg | INTRAVENOUS | Status: AC
  Administered 2017-02-08: 1000 mg via INTRAVENOUS
  Filled 2017-02-08: qty 100

## 2017-02-08 MED ORDER — PROPOFOL 10 MG/ML IV BOLUS
INTRAVENOUS | Status: AC
  Filled 2017-02-08: qty 20

## 2017-02-08 MED ORDER — SODIUM CHLORIDE 0.9 % IR SOLN
Status: DC | PRN
  Administered 2017-02-08: 1000 mL

## 2017-02-08 MED ORDER — TRANEXAMIC ACID 1000 MG/10ML IV SOLN
1000.0000 mg | Freq: Once | INTRAVENOUS | Status: AC
  Administered 2017-02-08: 1000 mg via INTRAVENOUS
  Filled 2017-02-08: qty 1100
  Filled 2017-02-08: qty 10

## 2017-02-08 MED ORDER — ISOPROPYL ALCOHOL 70 % SOLN
Status: DC | PRN
  Administered 2017-02-08: 1 via TOPICAL

## 2017-02-08 MED ORDER — ONDANSETRON HCL 4 MG/2ML IJ SOLN: 4.0000 mg | Freq: Four times a day (QID) | INTRAMUSCULAR | Status: DC | PRN

## 2017-02-08 MED ORDER — CEFAZOLIN SODIUM-DEXTROSE 1-4 GM/50ML-% IV SOLN
1.0000 g | Freq: Four times a day (QID) | INTRAVENOUS | Status: AC
  Administered 2017-02-08 (×2): 1 g via INTRAVENOUS
  Filled 2017-02-08 (×2): qty 50

## 2017-02-08 MED ORDER — OXYCODONE HCL 5 MG PO TABS: 5.0000 mg | ORAL_TABLET | Freq: Once | ORAL | Status: DC | PRN

## 2017-02-08 MED ORDER — ONDANSETRON HCL 4 MG/2ML IJ SOLN
INTRAMUSCULAR | Status: DC | PRN
  Administered 2017-02-08: 4 mg via INTRAVENOUS

## 2017-02-08 MED ORDER — ETOMIDATE 2 MG/ML IV SOLN
INTRAVENOUS | Status: DC | PRN
  Administered 2017-02-08: 4 mg via INTRAVENOUS
  Administered 2017-02-08: 16 mg via INTRAVENOUS

## 2017-02-08 MED ORDER — METHOCARBAMOL 1000 MG/10ML IJ SOLN
500.0000 mg | Freq: Four times a day (QID) | INTRAMUSCULAR | Status: DC | PRN
  Administered 2017-02-08: 500 mg via INTRAVENOUS
  Filled 2017-02-08: qty 550

## 2017-02-08 MED ORDER — LACTATED RINGERS IV SOLN
INTRAVENOUS | Status: DC | PRN
  Administered 2017-02-08: 07:00:00 via INTRAVENOUS

## 2017-02-08 MED ORDER — KETOROLAC TROMETHAMINE 30 MG/ML IJ SOLN
INTRAMUSCULAR | Status: AC
  Filled 2017-02-08: qty 1

## 2017-02-08 MED ORDER — DEXAMETHASONE SODIUM PHOSPHATE 10 MG/ML IJ SOLN
10.0000 mg | Freq: Once | INTRAMUSCULAR | Status: AC
  Administered 2017-02-09: 09:00:00 10 mg via INTRAVENOUS
  Filled 2017-02-08: qty 1

## 2017-02-08 MED ORDER — ALBUMIN HUMAN 5 % IV SOLN
INTRAVENOUS | Status: AC
  Filled 2017-02-08: qty 250

## 2017-02-08 MED ORDER — AMLODIPINE BESYLATE 10 MG PO TABS
10.0000 mg | ORAL_TABLET | Freq: Every day | ORAL | Status: DC
  Administered 2017-02-10: 10 mg via ORAL
  Filled 2017-02-08 (×2): qty 1

## 2017-02-08 MED ORDER — POLYETHYLENE GLYCOL 3350 17 G PO PACK
17.0000 g | PACK | Freq: Every day | ORAL | Status: DC | PRN
  Administered 2017-02-10: 17 g via ORAL
  Filled 2017-02-08: qty 1

## 2017-02-08 MED ORDER — HYDROCODONE-ACETAMINOPHEN 5-325 MG PO TABS
2.0000 | ORAL_TABLET | ORAL | Status: DC | PRN
  Administered 2017-02-09 – 2017-02-10 (×6): 2 via ORAL
  Filled 2017-02-08 (×6): qty 2

## 2017-02-08 MED ORDER — POVIDONE-IODINE 10 % EX SWAB: 2.0000 "application " | Freq: Once | CUTANEOUS | Status: DC

## 2017-02-08 MED ORDER — SUGAMMADEX SODIUM 200 MG/2ML IV SOLN
INTRAVENOUS | Status: AC
  Filled 2017-02-08: qty 2

## 2017-02-08 MED ORDER — PHENYLEPHRINE HCL 10 MG/ML IJ SOLN
INTRAVENOUS | Status: DC | PRN
  Administered 2017-02-08: 25 ug/min via INTRAVENOUS

## 2017-02-08 MED ORDER — SODIUM CHLORIDE 0.9 % IJ SOLN
INTRAMUSCULAR | Status: AC
  Filled 2017-02-08: qty 50

## 2017-02-08 MED ORDER — SODIUM CHLORIDE 0.9 % IV SOLN: INTRAVENOUS | Status: DC

## 2017-02-08 MED ORDER — FENTANYL CITRATE (PF) 100 MCG/2ML IJ SOLN
INTRAMUSCULAR | Status: AC
  Filled 2017-02-08: qty 2

## 2017-02-08 MED ORDER — ISOPROPYL ALCOHOL 70 % SOLN
Status: AC
  Filled 2017-02-08: qty 480

## 2017-02-08 MED ORDER — HYDROCODONE-ACETAMINOPHEN 5-325 MG PO TABS
1.0000 | ORAL_TABLET | ORAL | Status: DC | PRN
  Administered 2017-02-08 – 2017-02-09 (×4): 1 via ORAL
  Filled 2017-02-08 (×4): qty 1

## 2017-02-08 MED ORDER — CEFAZOLIN SODIUM-DEXTROSE 2-4 GM/100ML-% IV SOLN
2.0000 g | INTRAVENOUS | Status: AC
  Administered 2017-02-08: 2 g via INTRAVENOUS
  Filled 2017-02-08: qty 100

## 2017-02-08 MED ORDER — ALUM & MAG HYDROXIDE-SIMETH 200-200-20 MG/5ML PO SUSP
30.0000 mL | ORAL | Status: DC | PRN
  Administered 2017-02-09: 30 mL via ORAL
  Filled 2017-02-08: qty 30

## 2017-02-08 MED ORDER — CHLORHEXIDINE GLUCONATE 4 % EX LIQD: 60.0000 mL | Freq: Once | CUTANEOUS | Status: DC

## 2017-02-08 MED ORDER — ACETAMINOPHEN 325 MG PO TABS
650.0000 mg | ORAL_TABLET | ORAL | Status: DC | PRN
  Administered 2017-02-09: 09:00:00 650 mg via ORAL
  Filled 2017-02-08: qty 2

## 2017-02-08 MED ORDER — ONDANSETRON HCL 4 MG PO TABS: 4.0000 mg | ORAL_TABLET | Freq: Four times a day (QID) | ORAL | Status: DC | PRN

## 2017-02-08 MED ORDER — SPIRONOLACTONE 25 MG PO TABS
25.0000 mg | ORAL_TABLET | Freq: Every day | ORAL | Status: DC
Start: 2017-02-09 — End: 2017-02-10
  Administered 2017-02-09 – 2017-02-10 (×2): 25 mg via ORAL
  Filled 2017-02-08 (×2): qty 1

## 2017-02-08 MED ORDER — KETOROLAC TROMETHAMINE 30 MG/ML IJ SOLN
INTRAMUSCULAR | Status: DC | PRN
  Administered 2017-02-08: 30 mg

## 2017-02-08 MED ORDER — ROCURONIUM BROMIDE 50 MG/5ML IV SOSY
PREFILLED_SYRINGE | INTRAVENOUS | Status: AC
  Filled 2017-02-08: qty 5

## 2017-02-08 MED ORDER — PHENYLEPHRINE HCL 10 MG/ML IJ SOLN
INTRAMUSCULAR | Status: AC
  Filled 2017-02-08: qty 2

## 2017-02-08 MED ORDER — DEXAMETHASONE SODIUM PHOSPHATE 4 MG/ML IJ SOLN
INTRAMUSCULAR | Status: DC | PRN
  Administered 2017-02-08: 5 mg via INTRAVENOUS

## 2017-02-08 MED ORDER — SENNA 8.6 MG PO TABS
2.0000 | ORAL_TABLET | Freq: Every day | ORAL | Status: DC
  Administered 2017-02-08 – 2017-02-09 (×2): 17.2 mg via ORAL
  Filled 2017-02-08 (×2): qty 2

## 2017-02-08 MED ORDER — MENTHOL 3 MG MT LOZG: 1.0000 | LOZENGE | OROMUCOSAL | Status: DC | PRN

## 2017-02-08 MED ORDER — ACETAMINOPHEN 650 MG RE SUPP: 650.0000 mg | RECTAL | Status: DC | PRN

## 2017-02-08 MED ORDER — ONDANSETRON HCL 4 MG/2ML IJ SOLN
INTRAMUSCULAR | Status: AC
  Filled 2017-02-08: qty 2

## 2017-02-08 MED ORDER — TRANEXAMIC ACID 1000 MG/10ML IV SOLN
1000.0000 mg | INTRAVENOUS | Status: AC
  Administered 2017-02-08: 1000 mg via INTRAVENOUS
  Filled 2017-02-08: qty 1100

## 2017-02-08 MED ORDER — METHOCARBAMOL 500 MG PO TABS
500.0000 mg | ORAL_TABLET | Freq: Four times a day (QID) | ORAL | Status: DC | PRN
  Administered 2017-02-08 – 2017-02-09 (×2): 500 mg via ORAL
  Filled 2017-02-08 (×2): qty 1

## 2017-02-08 MED ORDER — VITAMIN C 500 MG PO TABS
500.0000 mg | ORAL_TABLET | Freq: Every day | ORAL | Status: DC
  Administered 2017-02-09 – 2017-02-10 (×2): 500 mg via ORAL
  Filled 2017-02-08 (×2): qty 1

## 2017-02-08 MED ORDER — LIDOCAINE 2% (20 MG/ML) 5 ML SYRINGE
INTRAMUSCULAR | Status: DC | PRN
  Administered 2017-02-08: 60 mg via INTRAVENOUS

## 2017-02-08 MED ORDER — DEXAMETHASONE SODIUM PHOSPHATE 10 MG/ML IJ SOLN
INTRAMUSCULAR | Status: AC
  Filled 2017-02-08: qty 1

## 2017-02-08 MED ORDER — DIPHENHYDRAMINE HCL 12.5 MG/5ML PO ELIX: 12.5000 mg | ORAL_SOLUTION | ORAL | Status: DC | PRN

## 2017-02-08 MED ORDER — BUPIVACAINE-EPINEPHRINE 0.25% -1:200000 IJ SOLN
INTRAMUSCULAR | Status: DC | PRN
  Administered 2017-02-08: 30 mL

## 2017-02-08 MED ORDER — OXYCODONE HCL 5 MG/5ML PO SOLN
5.0000 mg | Freq: Once | ORAL | Status: DC | PRN
  Filled 2017-02-08: qty 5

## 2017-02-08 MED ORDER — PHENOL 1.4 % MT LIQD
1.0000 | OROMUCOSAL | Status: DC | PRN
  Filled 2017-02-08: qty 177

## 2017-02-08 MED ORDER — METOCLOPRAMIDE HCL 5 MG/ML IJ SOLN: 5.0000 mg | Freq: Three times a day (TID) | INTRAMUSCULAR | Status: DC | PRN

## 2017-02-08 MED ORDER — ROCURONIUM BROMIDE 10 MG/ML (PF) SYRINGE
PREFILLED_SYRINGE | INTRAVENOUS | Status: DC | PRN
  Administered 2017-02-08: 50 mg via INTRAVENOUS

## 2017-02-08 MED ORDER — BUPIVACAINE-EPINEPHRINE (PF) 0.25% -1:200000 IJ SOLN
INTRAMUSCULAR | Status: AC
  Filled 2017-02-08: qty 30

## 2017-02-08 MED ORDER — ALBUMIN HUMAN 5 % IV SOLN
INTRAVENOUS | Status: DC | PRN
  Administered 2017-02-08: 09:00:00 via INTRAVENOUS

## 2017-02-08 MED ORDER — ASPIRIN 81 MG PO CHEW
81.0000 mg | CHEWABLE_TABLET | Freq: Two times a day (BID) | ORAL | Status: DC
  Administered 2017-02-08 – 2017-02-10 (×4): 81 mg via ORAL
  Filled 2017-02-08 (×4): qty 1

## 2017-02-08 MED ORDER — DOCUSATE SODIUM 100 MG PO CAPS
100.0000 mg | ORAL_CAPSULE | Freq: Two times a day (BID) | ORAL | Status: DC
  Administered 2017-02-08 – 2017-02-10 (×4): 100 mg via ORAL
  Filled 2017-02-08 (×4): qty 1

## 2017-02-08 MED ORDER — FENTANYL CITRATE (PF) 100 MCG/2ML IJ SOLN
25.0000 ug | INTRAMUSCULAR | Status: DC | PRN
  Administered 2017-02-08 (×2): 25 ug via INTRAVENOUS

## 2017-02-08 MED ORDER — METOCLOPRAMIDE HCL 5 MG PO TABS: 5.0000 mg | ORAL_TABLET | Freq: Three times a day (TID) | ORAL | Status: DC | PRN

## 2017-02-08 MED ORDER — PROPOFOL 10 MG/ML IV BOLUS
INTRAVENOUS | Status: DC | PRN
  Administered 2017-02-08: 50 mg via INTRAVENOUS
  Administered 2017-02-08: 40 mg via INTRAVENOUS
  Administered 2017-02-08: 30 mg via INTRAVENOUS

## 2017-02-08 MED ORDER — PHENYLEPHRINE 40 MCG/ML (10ML) SYRINGE FOR IV PUSH (FOR BLOOD PRESSURE SUPPORT)
PREFILLED_SYRINGE | INTRAVENOUS | Status: DC | PRN
  Administered 2017-02-08: 80 ug via INTRAVENOUS

## 2017-02-08 MED ORDER — FENTANYL CITRATE (PF) 100 MCG/2ML IJ SOLN
INTRAMUSCULAR | Status: DC | PRN
  Administered 2017-02-08: 50 ug via INTRAVENOUS
  Administered 2017-02-08: 75 ug via INTRAVENOUS
  Administered 2017-02-08: 25 ug via INTRAVENOUS
  Administered 2017-02-08: 50 ug via INTRAVENOUS

## 2017-02-08 MED ORDER — ETOMIDATE 2 MG/ML IV SOLN
INTRAVENOUS | Status: AC
  Filled 2017-02-08: qty 10

## 2017-02-08 MED ORDER — LIDOCAINE 2% (20 MG/ML) 5 ML SYRINGE
INTRAMUSCULAR | Status: AC
  Filled 2017-02-08: qty 5

## 2017-02-08 MED ORDER — SODIUM CHLORIDE 0.9 % IV SOLN
INTRAVENOUS | Status: DC
  Administered 2017-02-08: 75 mL/h via INTRAVENOUS

## 2017-02-08 MED ORDER — HYDROMORPHONE HCL 1 MG/ML IJ SOLN
0.5000 mg | INTRAMUSCULAR | Status: DC | PRN
  Administered 2017-02-08: 15:00:00 0.5 mg via INTRAVENOUS
  Filled 2017-02-08: qty 1

## 2017-02-08 SURGICAL SUPPLY — 44 items
BAG ZIPLOCK 12X15 (MISCELLANEOUS) IMPLANT
CAPT HIP TOTAL 2 ×2 IMPLANT
CHLORAPREP W/TINT 26ML (MISCELLANEOUS) ×2 IMPLANT
COVER PERINEAL POST (MISCELLANEOUS) ×2 IMPLANT
COVER SURGICAL LIGHT HANDLE (MISCELLANEOUS) ×2 IMPLANT
DECANTER SPIKE VIAL GLASS SM (MISCELLANEOUS) ×2 IMPLANT
DERMABOND ADVANCED (GAUZE/BANDAGES/DRESSINGS) ×1
DERMABOND ADVANCED .7 DNX12 (GAUZE/BANDAGES/DRESSINGS) ×1 IMPLANT
DRAPE SHEET LG 3/4 BI-LAMINATE (DRAPES) ×6 IMPLANT
DRAPE STERI IOBAN 125X83 (DRAPES) ×2 IMPLANT
DRAPE U-SHAPE 47X51 STRL (DRAPES) ×4 IMPLANT
DRSG AQUACEL AG ADV 3.5X10 (GAUZE/BANDAGES/DRESSINGS) ×2 IMPLANT
ELECT PENCIL ROCKER SW 15FT (MISCELLANEOUS) ×2 IMPLANT
ELECT REM PT RETURN 15FT ADLT (MISCELLANEOUS) ×2 IMPLANT
GAUZE SPONGE 4X4 12PLY STRL (GAUZE/BANDAGES/DRESSINGS) ×2 IMPLANT
GLOVE BIO SURGEON STRL SZ8.5 (GLOVE) ×4 IMPLANT
GLOVE BIOGEL PI IND STRL 7.5 (GLOVE) ×6 IMPLANT
GLOVE BIOGEL PI IND STRL 8.5 (GLOVE) ×1 IMPLANT
GLOVE BIOGEL PI INDICATOR 7.5 (GLOVE) ×6
GLOVE BIOGEL PI INDICATOR 8.5 (GLOVE) ×1
GOWN SPEC L3 XXLG W/TWL (GOWN DISPOSABLE) ×2 IMPLANT
GOWN SPEC L4 XLG W/TWL (GOWN DISPOSABLE) ×4 IMPLANT
HANDPIECE INTERPULSE COAX TIP (DISPOSABLE) ×1
HOLDER FOLEY CATH W/STRAP (MISCELLANEOUS) ×2 IMPLANT
HOOD PEEL AWAY FLYTE STAYCOOL (MISCELLANEOUS) ×8 IMPLANT
LINER NEUTRAL 36ID 54OD (Liner) IMPLANT
MARKER SKIN DUAL TIP RULER LAB (MISCELLANEOUS) ×2 IMPLANT
NEEDLE SPNL 18GX3.5 QUINCKE PK (NEEDLE) ×2 IMPLANT
PACK ANTERIOR HIP CUSTOM (KITS) ×2 IMPLANT
SAW OSC TIP CART 19.5X105X1.3 (SAW) ×2 IMPLANT
SEALER BIPOLAR AQUA 6.0 (INSTRUMENTS) ×2 IMPLANT
SET HNDPC FAN SPRY TIP SCT (DISPOSABLE) ×1 IMPLANT
SUT ETHIBOND NAB CT1 #1 30IN (SUTURE) ×4 IMPLANT
SUT MNCRL AB 3-0 PS2 18 (SUTURE) ×2 IMPLANT
SUT MON AB 2-0 CT1 36 (SUTURE) ×4 IMPLANT
SUT STRATAFIX PDO 1 14 VIOLET (SUTURE) ×1
SUT STRATFX PDO 1 14 VIOLET (SUTURE) ×1
SUT VIC AB 2-0 CT1 27 (SUTURE) ×1
SUT VIC AB 2-0 CT1 TAPERPNT 27 (SUTURE) ×1 IMPLANT
SUTURE STRATFX PDO 1 14 VIOLET (SUTURE) ×1 IMPLANT
SYR 50ML LL SCALE MARK (SYRINGE) ×2 IMPLANT
TRAY FOLEY CATH 14FR (SET/KITS/TRAYS/PACK) ×2 IMPLANT
WATER STERILE IRR 1000ML POUR (IV SOLUTION) ×2 IMPLANT
YANKAUER SUCT BULB TIP 10FT TU (MISCELLANEOUS) ×2 IMPLANT

## 2017-02-08 NOTE — Discharge Instructions (Signed)
°Dr. Lorcan Shelp °Joint Replacement Specialist °[facility] °[address] °[place], [place] [address] °[phone] ° ° °TOTAL HIP REPLACEMENT POSTOPERATIVE DIRECTIONS ° ° ° °Hip Rehabilitation, Guidelines Following Surgery  ° °WEIGHT BEARING °Weight bearing as tolerated with assist device (walker, cane, etc) as directed, use it as long as suggested by your surgeon or therapist, typically at least 4-6 weeks. ° °The results of a hip operation are greatly improved after range of motion and muscle strengthening exercises. Follow all safety measures which are given to protect your hip. If any of these exercises cause increased pain or swelling in your joint, decrease the amount until you are comfortable again. Then slowly increase the exercises. Call your caregiver if you have problems or questions.  ° °HOME CARE INSTRUCTIONS  °Most of the following instructions are designed to prevent the dislocation of your new hip.  °Remove items at home which could result in a fall. This includes throw rugs or furniture in walking pathways.  °Continue medications as instructed at time of discharge. °· You may have some home medications which will be placed on hold until you complete the course of blood thinner medication. °· You may start showering once you are discharged home. Do not remove your dressing. °Do not put on socks or shoes without following the instructions of your caregivers.   °Sit on chairs with arms. Use the chair arms to help push yourself up when arising.  °Arrange for the use of a toilet seat elevator so you are not sitting low.  °· Walk with walker as instructed.  °You may resume a sexual relationship in one month or when given the OK by your caregiver.  °Use walker as long as suggested by your caregivers.  °You may put full weight on your legs and walk as much as is comfortable. °Avoid periods of inactivity such as sitting longer than an hour when not asleep. This helps prevent  blood clots.  °You may return to work once you are cleared by your surgeon.  °Do not drive a car for 6 weeks or until released by your surgeon.  °Do not drive while taking narcotics.  °Wear elastic stockings for two weeks following surgery during the day but you may remove then at night.  °Make sure you keep all of your appointments after your operation with all of your doctors and caregivers. You should call the office at the above phone number and make an appointment for approximately two weeks after the date of your surgery. °Please pick up a stool softener and laxative for home use as long as you are requiring pain medications. °· ICE to the affected hip every three hours for 30 minutes at a time and then as needed for pain and swelling. Continue to use ice on the hip for pain and swelling from surgery. You may notice swelling that will progress down to the foot and ankle.  This is normal after surgery.  Elevate the leg when you are not up walking on it.   °It is important for you to complete the blood thinner medication as prescribed by your doctor. °· Continue to use the breathing machine which will help keep your temperature down.  It is common for your temperature to cycle up and down following surgery, especially at night when you are not up moving around and exerting yourself.  The breathing machine keeps your lungs expanded and your temperature down. ° °RANGE OF MOTION AND STRENGTHENING EXERCISES  °These exercises are   designed to help you keep full movement of your hip joint. Follow your caregiver's or physical therapist's instructions. Perform all exercises about fifteen times, three times per day or as directed. Exercise both hips, even if you have had only one joint replacement. These exercises can be done on a training (exercise) mat, on the floor, on a table or on a bed. Use whatever works the best and is most comfortable for you. Use music or television while you are exercising so that the exercises  are a pleasant break in your day. This will make your life better with the exercises acting as a break in routine you can look forward to.  °Lying on your back, slowly slide your foot toward your buttocks, raising your knee up off the floor. Then slowly slide your foot back down until your leg is straight again.  °Lying on your back spread your legs as far apart as you can without causing discomfort.  °Lying on your side, raise your upper leg and foot straight up from the floor as far as is comfortable. Slowly lower the leg and repeat.  °Lying on your back, tighten up the muscle in the front of your thigh (quadriceps muscles). You can do this by keeping your leg straight and trying to raise your heel off the floor. This helps strengthen the largest muscle supporting your knee.  °Lying on your back, tighten up the muscles of your buttocks both with the legs straight and with the knee bent at a comfortable angle while keeping your heel on the floor.  ° °SKILLED REHAB INSTRUCTIONS: °If the patient is transferred to a skilled rehab facility following release from the hospital, a list of the current medications will be sent to the facility for the patient to continue.  When discharged from the skilled rehab facility, please have the facility set up the patient's Home Health Physical Therapy prior to being released. Also, the skilled facility will be responsible for providing the patient with their medications at time of release from the facility to include their pain medication and their blood thinner medication. If the patient is still at the rehab facility at time of the two week follow up appointment, the skilled rehab facility will also need to assist the patient in arranging follow up appointment in our office and any transportation needs. ° °MAKE SURE YOU:  °Understand these instructions.  °Will watch your condition.  °Will get help right away if you are not doing well or get worse. ° °Pick up stool softner and  laxative for home use following surgery while on pain medications. °Do not remove your dressing. °The dressing is waterproof--it is OK to take showers. °Continue to use ice for pain and swelling after surgery. °Do not use any lotions or creams on the incision until instructed by your surgeon. °Total Hip Protocol. ° ° °

## 2017-02-08 NOTE — Anesthesia Procedure Notes (Signed)
Procedure Name: Intubation Date/Time: 02/08/2017 7:39 AM Performed by: Vanessa Durhamochran, Jamarrion Budai Glenn, CRNA Pre-anesthesia Checklist: Patient identified, Emergency Drugs available, Suction available and Patient being monitored Patient Re-evaluated:Patient Re-evaluated prior to induction Oxygen Delivery Method: Circle system utilized Preoxygenation: Pre-oxygenation with 100% oxygen Induction Type: IV induction Ventilation: Mask ventilation without difficulty Laryngoscope Size: 2 and Miller Grade View: Grade II Tube type: Oral Number of attempts: 1 Airway Equipment and Method: Stylet Placement Confirmation: ETT inserted through vocal cords under direct vision,  positive ETCO2 and breath sounds checked- equal and bilateral Secured at: 22 cm Tube secured with: Tape Dental Injury: Teeth and Oropharynx as per pre-operative assessment

## 2017-02-08 NOTE — Anesthesia Preprocedure Evaluation (Signed)
Anesthesia Evaluation  Patient identified by MRN, date of birth, ID band Patient awake    Reviewed: Allergy & Precautions, H&P , NPO status , Patient's Chart, lab work & pertinent test results  Airway Mallampati: II   Neck ROM: full    Dental   Pulmonary shortness of breath,    breath sounds clear to auscultation       Cardiovascular hypertension, +CHF  + dysrhythmias + pacemaker + Valvular Problems/Murmurs  Rhythm:regular Rate:Normal  S/p AVR and MVr (2003).  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  Cardiology Jacinto Halim([place]) evaluated and feels she is high risk, but ok for hip surgery today.   Neuro/Psych    GI/Hepatic   Endo/Other    Renal/GU      Musculoskeletal  (+) Arthritis ,   Abdominal   Peds  Hematology   Anesthesia Other Findings   Reproductive/Obstetrics                             Anesthesia Physical Anesthesia Plan  ASA: III  Anesthesia Plan: General   Post-op Pain Management:    Induction: Intravenous  PONV Risk Score and Plan: 3 and Ondansetron, Dexamethasone and Treatment may vary due to age or medical condition  Airway Management Planned: Oral ETT  Additional Equipment: Arterial line  Intra-op Plan:   Post-operative Plan: Extubation in OR  Informed Consent: I have reviewed the patients History and Physical, chart, labs and discussed the procedure including the risks, benefits and alternatives for the proposed anesthesia with the patient or authorized representative who has indicated his/her understanding and acceptance.     Plan Discussed with: CRNA, Anesthesiologist and Surgeon  Anesthesia Plan Comments:         Anesthesia Quick Evaluation

## 2017-02-08 NOTE — Interval H&P Note (Signed)
History and Physical Interval Note:  02/08/2017 7:25 AM  [name]  has presented today for surgery, with the diagnosis of Degenerative joint disease left hip  The various methods of treatment have been discussed with the patient and family. After consideration of risks, benefits and other options for treatment, the patient has consented to  Procedure(s) with comments: LEFT TOTAL HIP ARTHROPLASTY ANTERIOR APPROACH (Left) - Needs RNFA as a surgical intervention .  The patient's history has been reviewed, patient examined, no change in status, stable for surgery.  I have reviewed the patient's chart and labs.  Questions were answered to the patient's satisfaction.     Iline OvenBrian J Dian Laprade

## 2017-02-08 NOTE — Transfer of Care (Signed)
Immediate Anesthesia Transfer of Care Note  Patient: [name]  Procedure(s) Performed: LEFT TOTAL HIP ARTHROPLASTY ANTERIOR APPROACH (Left Hip)  Patient Location: PACU  Anesthesia Type:General  Level of Consciousness: drowsy and patient cooperative  Airway & Oxygen Therapy: Patient Spontanous Breathing and Patient connected to face mask  Post-op Assessment: Report given to RN and Post -op Vital signs reviewed and stable  Post vital signs: Reviewed and stable  Last Vitals:  Vitals:   02/08/17 0611  BP: 137/78  Pulse: 88  Resp: 16  Temp: 36.8 C  SpO2: 98%    Last Pain:  Vitals:   02/08/17 0611  TempSrc: Oral      Patients Stated Pain Goal: 4 (02/08/17 0645)  Complications: No apparent anesthesia complications

## 2017-02-08 NOTE — Anesthesia Procedure Notes (Signed)
Arterial Line Insertion Start/End12/20/2018 7:10 AM, 02/08/2017 7:12 AM Performed by: Vanessa Durhamochran, Reene Harlacher Glenn, CRNA, CRNA  Preanesthetic checklist: patient identified, IV checked, site marked, risks and benefits discussed, surgical consent, monitors and equipment checked, pre-op evaluation, timeout performed and anesthesia consent Lidocaine 1% used for infiltration and patient sedated Left, radial was placed Catheter size: 20 G Hand hygiene performed   Attempts: 1 Procedure performed without using ultrasound guided technique. Following insertion, Biopatch and dressing applied. Post procedure assessment: normal  Patient tolerated the procedure well with no immediate complications.

## 2017-02-08 NOTE — Op Note (Signed)
OPERATIVE REPORT  SURGEON: Samson FredericBrian Destyn Parfitt, MD   ASSISTANT: Skip MayerBlair Roberts, PA-C.  PREOPERATIVE DIAGNOSIS: Left hip arthritis with protrusio deformity.   POSTOPERATIVE DIAGNOSIS: Left hip arthritis with protrusio deformity.   PROCEDURE: Left total hip arthroplasty, anterior approach.   IMPLANTS: DePuy AML stem, size 19.5 mm, small stature. DePuy Pinnacle Cup, size 56 mm. DePuy Altrx liner, size 36 by 56 mm, neutral. DePuy Biolox ceramic head ball, size 36 + 1.5 mm. 6.5 mm cancellous bone screw x1.  ANESTHESIA:  General  ESTIMATED BLOOD LOSS:-500 mL    ANTIBIOTICS: 2 g Ancef.  DRAINS: None.  COMPLICATIONS: Greater trochanter avulsion fracture.   CONDITION: PACU - hemodynamically stable.   BRIEF CLINICAL NOTE: [name] is a 76 y.o. female with a long-standing history of Left hip arthritis. After failing conservative management, the patient was indicated for total hip arthroplasty. The risks, benefits, and alternatives to the procedure were explained, and the patient elected to proceed.  PROCEDURE IN DETAIL: Surgical site was marked by myself in the pre-op holding area. Once inside the operating room, spinal anesthesia was obtained, and a foley catheter was inserted. The patient was then positioned on the Hana table. All bony prominences were well padded. The hip was prepped and draped in the normal sterile surgical fashion. A time-out was called verifying side and site of surgery. The patient received IV antibiotics within 60 minutes of beginning the procedure.  The direct anterior approach to the hip was performed through the Hueter interval. Lateral femoral circumflex vessels were treated with the Auqumantys. The anterior capsule was exposed and an inverted T capsulotomy was made. The femoral head and neck were noted to be deep within the pelvis.  The femoral neck cut was made to the level of the templated cut. A corkscrew was placed into the head and the head  was removed. The femoral head was found to be collapsed with eburnated bone. The head was passed to the back table and was measured.  Acetabular exposure was achieved, and the pulvinar and labrum were excised.  I used a 43 mm reamer to very gently debride the soft tissue off the inner table of the pelvis.  Sequential reaming of the acetabulum was then performed up to a size 55 mm reamer, taking care to keep the hip center low and lateral. A 56 mm cup was then opened and impacted into place at approximately 40 degrees of abduction and 20 degrees of anteversion.  I achieved excellent press-fit stability.  I elected to place a single 6.5 mm cancellous bone screw to augment fixation.  The final polyethylene liner was impacted into place and acetabular osteophytes were removed.   I then turned my attention to the proximal femur. The capsule was peeled off the inner aspect of the greater trochanter, taking care to preserve the short external rotators.  External rotation was set.  I placed a bone hook within the canal of the proximal femur.  The lower extremity was then extended and abducted.  During this maneuver, the tip of the greater trochanter avulsed.  Therefore, I planned to place an AML implant.  There was no distal extension of the fracture.  A cookie cutter was used to enter the femoral canal, and then the femoral canal finder was placed. Sequential reaming was performed up to a size 19 mm reamer.  Sequential broaching was performed up to a size 19.5 small stature broach. Calcar planer was used on the femoral neck remnant. I placed a trial neck  and a trial head ball. The hip was reduced. Leg lengths and offset were checked fluoroscopically. The hip was dislocated and trial components were removed. The final implants were placed, and the hip was reduced.  Fluoroscopy was used to confirm component position and leg lengths. At 90 degrees of external rotation and full extension, the hip was stable to  an anterior directed force.  The wound was copiously irrigated with normal saline using pulse lavage. Marcaine solution was injected into the periarticular soft tissue. The wound was closed in layers using #1 Vicryl and V-Loc for the fascia, 2-0 Vicryl for the subcutaneous fat, 2-0 Monocryl for the deep dermal layer, 3-0 running Monocryl subcuticular stitch, and Dermabond for the skin. Once the glue was fully dried, an Aquacell Ag dressing was applied. The patient was transported to the recovery room in stable condition. Sponge, needle, and instrument counts were correct at the end of the case x2. The patient tolerated the procedure well and there were no known complications.  Please note that a surgical assistant was a medical necessity for this procedure to perform it in a safe and expeditious manner. Assistant was necessary to provide appropriate retraction of vital neurovascular structures, to prevent femoral fracture, and to allow for anatomic placement of the prosthesis.

## 2017-02-09 ENCOUNTER — Encounter (HOSPITAL_COMMUNITY): Payer: Self-pay | Admitting: Orthopedic Surgery

## 2017-02-09 LAB — BASIC METABOLIC PANEL
ANION GAP: 4 — AB (ref 5–15)
BUN: 20 mg/dL (ref 6–20)
CHLORIDE: 106 mmol/L (ref 101–111)
CO2: 26 mmol/L (ref 22–32)
Calcium: 9 mg/dL (ref 8.9–10.3)
Creatinine, Ser: 1.18 mg/dL — ABNORMAL HIGH (ref 0.44–1.00)
GFR calc Af Amer: 50 mL/min — ABNORMAL LOW (ref >60)
GFR, EST NON AFRICAN AMERICAN: 43 mL/min — AB (ref >60)
GLUCOSE: 105 mg/dL — AB (ref 65–99)
POTASSIUM: 4.8 mmol/L (ref 3.5–5.1)
Sodium: 136 mmol/L (ref 135–145)

## 2017-02-09 LAB — CBC
HEMATOCRIT: 19.4 % — AB (ref 36.0–46.0)
Hemoglobin: 6.5 g/dL — CL (ref 12.0–15.0)
MCH: 32.2 pg (ref 26.0–34.0)
MCHC: 33.5 g/dL (ref 30.0–36.0)
MCV: 96 fL (ref 78.0–100.0)
PLATELETS: 88 10*3/uL — AB (ref 150–400)
RBC: 2.02 MIL/uL — AB (ref 3.87–5.11)
RDW: 14.9 % (ref 11.5–15.5)
WBC: 7.9 10*3/uL (ref 4.0–10.5)

## 2017-02-09 LAB — PREPARE RBC (CROSSMATCH)

## 2017-02-09 MED ORDER — HYDROCODONE-ACETAMINOPHEN 5-325 MG PO TABS: 1.0000 | ORAL_TABLET | Freq: Four times a day (QID) | ORAL | 0 refills | Status: DC | PRN

## 2017-02-09 MED ORDER — SODIUM CHLORIDE 0.9 % IV SOLN: Freq: Once | INTRAVENOUS | Status: DC

## 2017-02-09 MED ORDER — DOCUSATE SODIUM 100 MG PO CAPS: 100.0000 mg | ORAL_CAPSULE | Freq: Two times a day (BID) | ORAL | 1 refills | Status: DC

## 2017-02-09 MED ORDER — ASPIRIN 81 MG PO CHEW: 81.0000 mg | CHEWABLE_TABLET | Freq: Two times a day (BID) | ORAL | 1 refills | Status: DC

## 2017-02-09 MED ORDER — ONDANSETRON HCL 4 MG PO TABS: 4.0000 mg | ORAL_TABLET | Freq: Four times a day (QID) | ORAL | 0 refills | Status: DC | PRN

## 2017-02-09 MED ORDER — SENNA 8.6 MG PO TABS: 2.0000 | ORAL_TABLET | Freq: Every day | ORAL | 0 refills | Status: DC

## 2017-02-09 NOTE — Anesthesia Postprocedure Evaluation (Signed)
Anesthesia Post Note  Patient: [name]  Procedure(s) Performed: LEFT TOTAL HIP ARTHROPLASTY ANTERIOR APPROACH (Left Hip)     Patient location during evaluation: PACU Anesthesia Type: General Level of consciousness: awake and alert Pain management: pain level controlled Vital Signs Assessment: post-procedure vital signs reviewed and stable Respiratory status: spontaneous breathing, nonlabored ventilation, respiratory function stable and patient connected to nasal cannula oxygen Cardiovascular status: blood pressure returned to baseline and stable Postop Assessment: no apparent nausea or vomiting Anesthetic complications: no    Last Vitals:  Vitals:   02/09/17 0230 02/09/17 0530  BP: (!) 102/49 (!) 111/57  Pulse: (!) 59 (!) 59  Resp: 18 18  Temp: 37.2 C 37.2 C  SpO2: 100% 100%    Last Pain:  Vitals:   02/09/17 0530  TempSrc: Oral  PainSc:                  Rubyann Lingle S

## 2017-02-09 NOTE — Discharge Summary (Signed)
Physician Discharge Summary  Patient ID: [name] MRN: [id number] DOB/AGE: 77/08/1940 77 y.o.  Admit date: 02/08/2017 Discharge date: 02/10/2017  Admission Diagnoses:  Primary osteoarthritis of left hip  Discharge Diagnoses:  Principal Problem:   Primary osteoarthritis of left hip Active Problems:   Osteoarthritis of left hip   Past Medical History:  Diagnosis Date  . Aortic stenosis    2013  . Arthritis    "hips" (12/21/2016)  . Atrial fibrillation [facility](HCC)    Status post cardioversion November 2013  . Chronic diastolic CHF (congestive heart failure) (HCC)   . Chronic kidney disease   . Complication of anesthesia    after hip surgery 06-2016 bp and heart rate dropped pt. has had a pacemaker placed since this event.  . History of blood transfusion 05/2016   "when I had my hip replaced" (12/21/2016)  . Hypertension   . Presence of permanent cardiac pacemaker    medtronic  . Shortness of breath    hx dyspnea and respiratory abnormalities    Surgeries: Procedure(s): LEFT TOTAL HIP ARTHROPLASTY ANTERIOR APPROACH on 02/08/2017   Consultants (if any):   Discharged Condition: Improved  Hospital Course: [name] is an 76 y.o. female who was admitted 02/08/2017 with a diagnosis of Primary osteoarthritis of left hip and went to the operating room on 02/08/2017 and underwent the above named procedures.    She was given perioperative antibiotics:  Anti-infectives (From admission, onward)   Start     Dose/Rate Route Frequency Ordered Stop   02/08/17 1400  ceFAZolin (ANCEF) IVPB 1 g/50 mL premix     1 g 100 mL/hr over 30 Minutes Intravenous Every 6 hours 02/08/17 1222 02/08/17 2111   02/08/17 0622  ceFAZolin (ANCEF) IVPB 2g/100 mL premix     2 g 200 mL/hr over 30 Minutes Intravenous On call to O.R. 02/08/17 [id number] 02/08/17 0805    .  She was given sequential compression devices, early ambulation, and ASA for DVT prophylaxis.  On POD#1, hgb was 6.5. 2 units PRBCs  were given with excellent response.  She benefited maximally from the hospital stay and there were no complications.    Recent vital signs:  Vitals:   02/10/17 0610 02/10/17 0926  BP: (!) 142/82 126/66  Pulse: 83 81  Resp: 20 18  Temp: 97.7 F (36.5 C) 98.4 F (36.9 C)  SpO2: 100% 100%    Recent laboratory studies:  Lab Results  Component Value Date   HGB 10.5 (L) 02/10/2017   HGB 6.5 (LL) 02/09/2017   HGB 11.2 (L) 02/06/2017   Lab Results  Component Value Date   WBC 10.6 (H) 02/10/2017   PLT 96 (L) 02/10/2017   Lab Results  Component Value Date   INR 1.09 12/12/2016   Lab Results  Component Value Date   NA 136 02/09/2017   K 4.8 02/09/2017   CL 106 02/09/2017   CO2 26 02/09/2017   BUN 20 02/09/2017   CREATININE 1.18 (H) 02/09/2017   GLUCOSE 105 (H) 02/09/2017    Discharge Medications:   Allergies as of 02/10/2017   No Known Allergies     Medication List    STOP taking these medications   ICY HOT EX   traMADol 50 MG tablet Commonly known as:  ULTRAM     TAKE these medications   amLODipine 10 MG tablet Commonly known as:  NORVASC Take 10 mg by mouth daily.   aspirin 81 MG chewable tablet Chew 1 tablet (81 mg  total) by mouth 2 (two) times daily.   docusate sodium 100 MG capsule Commonly known as:  COLACE Take 1 capsule (100 mg total) by mouth 2 (two) times daily.   HYDROcodone-acetaminophen 5-325 MG tablet Commonly known as:  NORCO/VICODIN Take 1-2 tablets by mouth every 6 (six) hours as needed (hip pain).   multivitamin with minerals Tabs tablet Take 1 tablet by mouth daily.   naproxen sodium 220 MG tablet Commonly known as:  ALEVE Take 440 mg by mouth 2 (two) times daily.   ondansetron 4 MG tablet Commonly known as:  ZOFRAN Take 1 tablet (4 mg total) by mouth every 6 (six) hours as needed for nausea.   senna 8.6 MG Tabs tablet Commonly known as:  SENOKOT Take 2 tablets (17.2 mg total) by mouth at bedtime.   spironolactone 25 MG  tablet Commonly known as:  ALDACTONE Take 25 mg by mouth daily.   vitamin C 500 MG tablet Commonly known as:  ASCORBIC ACID Take 500 mg by mouth daily.       Diagnostic Studies: Dg Pelvis Portable  Result Date: 02/08/2017 CLINICAL DATA:  Status post left hip joint prosthesis placement. EXAM: PORTABLE PELVIS 1-2 VIEWS COMPARISON:  Fluoro spot images of earlier today. FINDINGS: The patient undergone left hip joint prosthesis placement. Radiographic positioning of the prosthetic components is good. The interface with the native bone appears normal. There is pre-existing protrusio acetabuli. There is a pre-existing right hip joint prosthesis. IMPRESSION: No immediate postprocedure complication following left total hip joint prosthesis placement. Electronically Signed   By: David  SwazilandJordan M.D.   On: 02/08/2017 11:02   Dg C-arm 1-60 Min-no Report  Result Date: 02/08/2017 Fluoroscopy was utilized by the requesting physician.  No radiographic interpretation.   Dg Hip Operative Unilat W Or W/o Pelvis Left  Result Date: 02/08/2017 CLINICAL DATA:  Left hip replacement EXAM: OPERATIVE LEFT HIP (WITH PELVIS IF PERFORMED) 4 VIEWS TECHNIQUE: Fluoroscopic spot image(s) were submitted for interpretation post-operatively. COMPARISON:  06/15/2016 FINDINGS: Changes of left hip replacement. Normal AP alignment. No hardware bony complicating feature visualized. IMPRESSION: Left hip replacement.  No complicating feature. Electronically Signed   By: Charlett NoseKevin  Dover M.D.   On: 02/08/2017 10:30    Disposition: 01-Home or Self Care  Discharge Instructions    Call MD / Call 911   Complete by:  As directed    If you experience chest pain or shortness of breath, CALL 911 and be transported to the hospital emergency room.  If you develope a fever above 101 F, pus (white drainage) or increased drainage or redness at the wound, or calf pain, call your surgeon's office.   Constipation Prevention   Complete by:  As  directed    Drink plenty of fluids.  Prune juice may be helpful.  You may use a stool softener, such as Colace (over the counter) 100 mg twice a day.  Use MiraLax (over the counter) for constipation as needed.   Diet - low sodium heart healthy   Complete by:  As directed    Driving restrictions   Complete by:  As directed    No driving for 6 weeks   Increase activity slowly as tolerated   Complete by:  As directed    Lifting restrictions   Complete by:  As directed    No lifting for 6 weeks   TED hose   Complete by:  As directed    Use stockings (TED hose) for 2 weeks on both leg(s).  You may remove them at night for sleeping.      Follow-up Information    Rustin Erhart, Arlys John, MD. Schedule an appointment as soon as possible for a visit in 2 weeks.   Specialty:  Orthopedic Surgery Why:  For wound re-check Contact information: [address] [place] [address] [phone]            Signed: Iline Oven Kellen Hover 02/11/2017, 2:52 AM

## 2017-02-09 NOTE — Progress Notes (Signed)
   Subjective:  Patient reports pain as mild to moderate.  Denies N/V/CP/SOB.  Objective:   VITALS:   Vitals:   02/08/17 2122 02/08/17 2200 02/09/17 0230 02/09/17 0530  BP: (!) 97/50  (!) 102/49 (!) 111/57  Pulse: 60  (!) 59 (!) 59  Resp: 16  18 18   Temp: 100.3 F (37.9 C) 99.3 F (37.4 C) 98.9 F (37.2 C) 98.9 F (37.2 C)  TempSrc: Oral Oral Oral Oral  SpO2: 100%  100% 100%  Weight:      Height:        NAD ABD soft Sensation intact distally Intact pulses distally Dorsiflexion/Plantar flexion intact Incision: dressing C/D/I Compartment soft   Lab Results  Component Value Date   WBC 4.7 02/06/2017   HGB 11.2 (L) 02/06/2017   HCT 33.7 (L) 02/06/2017   MCV 95.7 02/06/2017   PLT 139 (L) 02/06/2017   BMET    Component Value Date/Time   NA 137 02/06/2017 1230   K 4.6 02/06/2017 1230   CL 102 02/06/2017 1230   CO2 27 02/06/2017 1230   GLUCOSE 91 02/06/2017 1230   BUN 25 (H) 02/06/2017 1230   CREATININE 1.08 (H) 02/06/2017 1230   CALCIUM 10.6 (H) 02/06/2017 1230   GFRNONAA 48 (L) 02/06/2017 1230   GFRAA 56 (L) 02/06/2017 1230     Assessment/Plan: 1 Day Post-Op   Principal Problem:   Primary osteoarthritis of left hip Active Problems:   Osteoarthritis of left hip   WBAT with walker ASA, SCDs, TEDs PO pain control PT/OT Dispo: D/C home tomorrow   Iline OvenBrian J Raijon Lindfors 02/09/2017, 7:01 AM   Samson FredericBrian Alexia Dinger, MD Cell [phone]

## 2017-02-09 NOTE — Evaluation (Signed)
Physical Therapy Evaluation Patient Details Name: [name] MRN: [id number] DOB: 05/06/1940 Today's Date: 02/09/2017   History of Present Illness  Pt s/p L THR and with hx of R THR (4/18), CKD, CHF, A-fib, aortic valve replacement and pacemaker  Clinical Impression  Pt s/p L THR and presents with decreased L LE strength/ROM and post op pain limiting functional mobility.  Pt should progress to dc home with 24/7 assist of family.    Follow Up Recommendations DC plan and follow up therapy as arranged by surgeon    Equipment Recommendations  None recommended by PT    Recommendations for Other Services OT consult     Precautions / Restrictions Precautions Precautions: Fall Restrictions Weight Bearing Restrictions: No Other Position/Activity Restrictions: WBAT      Mobility  Bed Mobility               General bed mobility comments: OOB deferred to after blood transfusion  Transfers                    Ambulation/Gait                Stairs            Wheelchair Mobility    Modified Rankin (Stroke Patients Only)       Balance                                             Pertinent Vitals/Pain Pain Assessment: 0-10 Pain Score: 4  Pain Location: L hip Pain Descriptors / Indicators: Aching;Sore Pain Intervention(s): Limited activity within patient's tolerance;Monitored during session;Premedicated before session    Home Living Family/patient expects to be discharged to:: Private residence Living Arrangements: Alone Available Help at Discharge: Family;Available 24 hours/day Type of Home: House Home Access: Stairs to enter Entrance Stairs-Rails: Right;Left;Can reach both Entrance Stairs-Number of Steps: 3 Home Layout: One level Home Equipment: Toilet riser;Walker - 2 wheels Additional Comments: Pt states she is going [place] her [place] house" [place] in [place]    Prior Function Level of Independence:  Independent;Independent with assistive device(s)               Hand Dominance        Extremity/Trunk Assessment   Upper Extremity Assessment Upper Extremity Assessment: Overall WFL for tasks assessed    Lower Extremity Assessment Lower Extremity Assessment: LLE deficits/detail LLE Deficits / Details: Strength at L hip 2/5 with AAROM to 80 flex and 10 abd    Cervical / Trunk Assessment Cervical / Trunk Assessment: Kyphotic  Communication   Communication: No difficulties  Cognition Arousal/Alertness: Awake/alert Behavior During Therapy: WFL for tasks assessed/performed Overall Cognitive Status: Within Functional Limits for tasks assessed                                        General Comments      Exercises Total Joint Exercises Ankle Circles/Pumps: AROM;Both;15 reps;Supine Quad Sets: AROM;Both;10 reps;Supine Heel Slides: AAROM;15 reps;Supine;Left Hip ABduction/ADduction: AAROM;Left;10 reps;Supine   Assessment/Plan    PT Assessment Patient needs continued PT services  PT Problem List Decreased strength;Decreased range of motion;Decreased activity tolerance;Decreased mobility;Pain;Decreased knowledge of use of DME       PT Treatment Interventions DME instruction;Gait training;Stair training;Functional  mobility training;Therapeutic exercise;Therapeutic activities;Patient/family education    PT Goals (Current goals can be found in the Care Plan section)  Acute Rehab PT Goals Patient Stated Goal: Regain IND and resume previous active lifestyle PT Goal Formulation: With patient Time For Goal Achievement: 02/16/17 Potential to Achieve Goals: Good    Frequency 7X/week   Barriers to discharge        Co-evaluation               AM-PAC PT "6 Clicks" Daily Activity  Outcome Measure Difficulty turning over in bed (including adjusting bedclothes, sheets and blankets)?: Unable Difficulty moving from lying on back to sitting on the side of  the bed? : Unable Difficulty sitting down on and standing up from a chair with arms (e.g., wheelchair, bedside commode, etc,.)?: Unable Help needed moving to and from a bed to chair (including a wheelchair)?: A Lot Help needed walking in hospital room?: A Lot Help needed climbing 3-5 steps with a railing? : Total 6 Click Score: 8    End of Session   Activity Tolerance: Patient limited by fatigue;Patient limited by pain Patient left: in bed;with call bell/phone within reach Nurse Communication: Mobility status PT Visit Diagnosis: Difficulty in walking, not elsewhere classified (R26.2);Pain Pain - Right/Left: Left Pain - part of body: Hip    Time: 1135-1157 PT Time Calculation (min) (ACUTE ONLY): 22 min   Charges:   PT Evaluation $PT Eval Low Complexity: 1 Low     PT G Codes:        Pg [phone]   Dequante Tremaine 02/09/2017, 12:22 PM

## 2017-02-09 NOTE — Progress Notes (Signed)
Discharge plan: Pt for HEP and states she has a RW and 3in1 at home. Sandford Crazeora Rosita Guzzetta RN,BSN,NCM [phone]

## 2017-02-09 NOTE — Progress Notes (Signed)
Physical Therapy Treatment Patient Details Name: [name] MRN: [id number] DOB: 11/04/1940 Today's Date: 02/09/2017    History of Present Illness Pt s/p L THR and with hx of R THR (4/18), CKD, CHF, A-fib, aortic valve replacement and pacemaker    PT Comments    Pt motivated but requiring increased time and assist to mobilize from bed and short distance ambulating into hall way.     Follow Up Recommendations  DC plan and follow up therapy as arranged by surgeon     Equipment Recommendations  None recommended by PT    Recommendations for Other Services OT consult     Precautions / Restrictions Precautions Precautions: Fall Restrictions Weight Bearing Restrictions: No Other Position/Activity Restrictions: WBAT    Mobility  Bed Mobility Overal bed mobility: Needs Assistance Bed Mobility: Supine to Sit     Supine to sit: Mod assist;+2 for safety/equipment     General bed mobility comments: cues for sequence and use of R LE to self assist.  Increased time and use of pad to assist pt to move to EOB.    Transfers Overall transfer level: Needs assistance Equipment used: Rolling walker (2 wheeled) Transfers: Sit to/from Stand Sit to Stand: Min assist;Mod assist;+2 physical assistance;+2 safety/equipment;From elevated surface         General transfer comment: cues for LE management and use of UEs to self assist  Ambulation/Gait Ambulation/Gait assistance: Min assist;+2 physical assistance;+2 safety/equipment Ambulation Distance (Feet): 18 Feet Assistive device: Rolling walker (2 wheeled) Gait Pattern/deviations: Step-to pattern;Decreased step length - right;Decreased step length - left;Shuffle;Trunk flexed Gait velocity: decr Gait velocity interpretation: Below normal speed for age/gender General Gait Details: cues for sequence, posture and position from [employer]W   Stairs            Wheelchair Mobility    Modified Rankin (Stroke Patients Only)        Balance                                            Cognition Arousal/Alertness: Awake/alert Behavior During Therapy: WFL for tasks assessed/performed Overall Cognitive Status: Within Functional Limits for tasks assessed                                        Exercises Total Joint Exercises Ankle Circles/Pumps: AROM;Both;15 reps;Supine Quad Sets: AROM;Both;10 reps;Supine Heel Slides: AAROM;15 reps;Supine;Left Hip ABduction/ADduction: AAROM;Left;10 reps;Supine    General Comments        Pertinent Vitals/Pain Pain Assessment: 0-10 Pain Score: 7  Pain Location: L hip with WB ambulation but decreased to 3/10 upon sitting Pain Descriptors / Indicators: Aching;Sore;Burning Pain Intervention(s): Limited activity within patient's tolerance;Monitored during session;Premedicated before session;Ice applied    Home Living Family/patient expects to be discharged to:: Private residence Living Arrangements: Alone Available Help at Discharge: Family;Available 24 hours/day Type of Home: House Home Access: Stairs to enter Entrance Stairs-Rails: Right;Left;Can reach both Home Layout: One level Home Equipment: Toilet riser;Walker - 2 wheels Additional Comments: Pt states she is going to her "country house" [place] in [place]    Prior Function Level of Independence: Independent;Independent with assistive device(s)          PT Goals (current goals can now be found in the care plan section) Acute Rehab  PT Goals Patient Stated Goal: Regain IND and resume previous active lifestyle PT Goal Formulation: With patient Time For Goal Achievement: 02/16/17 Potential to Achieve Goals: Good Progress towards PT goals: Progressing toward goals    Frequency    7X/week      PT Plan Current plan remains appropriate    Co-evaluation              AM-PAC PT "6 Clicks" Daily Activity  Outcome Measure  Difficulty turning over in bed  (including adjusting bedclothes, sheets and blankets)?: Unable Difficulty moving from lying on back to sitting on the side of the bed? : Unable Difficulty sitting down on and standing up from a chair with arms (e.g., wheelchair, bedside commode, etc,.)?: Unable Help needed moving to and from a bed to chair (including a wheelchair)?: A Lot Help needed walking in hospital room?: A Lot Help needed climbing 3-5 steps with a railing? : Total 6 Click Score: 8    End of Session Equipment Utilized During Treatment: Gait belt Activity Tolerance: Patient limited by fatigue;Patient limited by pain Patient left: in chair;with call bell/phone within reach;with chair alarm set Nurse Communication: Mobility status PT Visit Diagnosis: Difficulty in walking, not elsewhere classified (R26.2);Pain Pain - Right/Left: Left Pain - part of body: Hip     Time: [id number] PT Time Calculation (min) (ACUTE ONLY): 30 min  Charges:  $Gait Training: 8-22 mins $Therapeutic Activity: 8-22 mins                    G Codes:       Pg [phone]    [name] 02/09/2017, 3:18 PM

## 2017-02-10 LAB — CBC
HEMATOCRIT: 30.8 % — AB (ref 36.0–46.0)
Hemoglobin: 10.5 g/dL — ABNORMAL LOW (ref 12.0–15.0)
MCH: 30.9 pg (ref 26.0–34.0)
MCHC: 34.1 g/dL (ref 30.0–36.0)
MCV: 90.6 fL (ref 78.0–100.0)
PLATELETS: 96 10*3/uL — AB (ref 150–400)
RBC: 3.4 MIL/uL — AB (ref 3.87–5.11)
RDW: 17.3 % — AB (ref 11.5–15.5)
WBC: 10.6 10*3/uL — AB (ref 4.0–10.5)

## 2017-02-10 LAB — TYPE AND SCREEN
ABO/RH(D): A POS
Antibody Screen: NEGATIVE
UNIT DIVISION: 0
Unit division: 0

## 2017-02-10 LAB — BPAM RBC
BLOOD PRODUCT EXPIRATION DATE: 201901152359
BLOOD PRODUCT EXPIRATION DATE: 201901152359
ISSUE DATE / TIME: 201812211708
ISSUE DATE / TIME: 201812211055
UNIT TYPE AND RH: 6200
UNIT TYPE AND RH: 6200

## 2017-02-10 NOTE — Progress Notes (Signed)
Physical Therapy Treatment Patient Details Name: [name] MRN: [id number] DOB: 01/23/1941 Today's Date: 02/10/2017    History of Present Illness Pt s/p L THR and with hx of R THR (4/18), CKD, CHF, A-fib, aortic valve replacement and pacemaker    PT Comments    Assisted with amb in hallway and practiced stairs.   Pt ready for D/C to home.  Follow Up Recommendations  DC plan and follow up therapy as arranged by surgeon     Equipment Recommendations  None recommended by PT    Recommendations for Other Services       Precautions / Restrictions Precautions Precautions: Fall Restrictions Weight Bearing Restrictions: No Other Position/Activity Restrictions: WBAT    Mobility  Bed Mobility               General bed mobility comments: OOB in recliner  Transfers Overall transfer level: Needs assistance Equipment used: Rolling walker (2 wheeled) Transfers: Sit to/from Stand Sit to Stand: Min guard         General transfer comment: cues for LE management and use of UEs to self assist  Ambulation/Gait Ambulation/Gait assistance: Min guard Ambulation Distance (Feet): 25 Feet Assistive device: Rolling walker (2 wheeled) Gait Pattern/deviations: Step-to pattern;Decreased step length - right;Decreased step length - left;Shuffle;Trunk flexed Gait velocity: decr   General Gait Details: cues for sequence, posture and position from RW   Stairs Stairs: Yes   Stair Management: Two rails;Forwards Number of Stairs: 2 General stair comments: practiced twice with 25% VC's on proper tech  Wheelchair Mobility    Modified Rankin (Stroke Patients Only)       Balance                                            Cognition Arousal/Alertness: Awake/alert Behavior During Therapy: WFL for tasks assessed/performed Overall Cognitive Status: Within Functional Limits for tasks assessed                                         Exercises   Total Hip Replacement TE's 10 reps ankle pumps 10 reps knee presses 10 reps heel slides 10 reps SAQ's 10 reps ABD Followed by ICE     General Comments        Pertinent Vitals/Pain Pain Assessment: 0-10 Pain Score: 6  Pain Location: L hip Pain Descriptors / Indicators: Aching;Sore;Burning Pain Intervention(s): Monitored during session;Repositioned;Ice applied;Premedicated before session    Home Living                      Prior Function            PT Goals (current goals can now be found in the care plan section) Progress towards PT goals: Progressing toward goals    Frequency    7X/week      PT Plan Current plan remains appropriate    Co-evaluation              AM-PAC PT "6 Clicks" Daily Activity  Outcome Measure  Difficulty turning over in bed (including adjusting bedclothes, sheets and blankets)?: Unable Difficulty moving from lying on back to sitting on the side of the bed? : Unable Difficulty sitting down on and standing up from a chair with arms (e.g., wheelchair, bedside commode,  etc,.)?: Unable Help needed moving to and from a bed to chair (including a wheelchair)?: Total Help needed walking in hospital room?: A Lot Help needed climbing 3-5 steps with a railing? : Total 6 Click Score: 7    End of Session Equipment Utilized During Treatment: Gait belt Activity Tolerance: Patient limited by fatigue;Patient limited by pain Patient left: in chair;with call bell/phone within reach;with chair alarm set Nurse Communication: Mobility status PT Visit Diagnosis: Difficulty in walking, not elsewhere classified (R26.2);Pain Pain - Right/Left: Left Pain - part of body: Hip     Time: 1440-1505 PT Time Calculation (min) (ACUTE ONLY): 25 min  Charges:  $Gait Training: 8-22 mins $Therapeutic Exercise: 8-22 mins                    G Codes:       Felecia ShellingLori Crystin Lechtenberg  PTA [facility]  Acute  Rehab Pager      [phone]

## 2017-02-10 NOTE — Progress Notes (Signed)
   Subjective:  Patient reports pain as mild to moderate.  Denies N/V/CP/SOB.  Objective:   VITALS:   Vitals:   02/09/17 1743 02/09/17 1949 02/09/17 2205 02/10/17 0610  BP: 116/76 107/68 128/77 (!) 142/82  Pulse: 71 71 88 83  Resp: 16 16 20 20   Temp: 98.8 F (37.1 C) 98.6 F (37 C) 98.3 F (36.8 C) 97.7 F (36.5 C)  TempSrc:  Oral Oral Oral  SpO2: 98%  100% 100%  Weight:      Height:        NAD ABD soft Sensation intact distally Intact pulses distally Dorsiflexion/Plantar flexion intact Incision: dressing C/D/I Compartment soft   Lab Results  Component Value Date   WBC 10.6 (H) 02/10/2017   HGB 10.5 (L) 02/10/2017   HCT 30.8 (L) 02/10/2017   MCV 90.6 02/10/2017   PLT 96 (L) 02/10/2017   BMET    Component Value Date/Time   NA 136 02/09/2017 0559   K 4.8 02/09/2017 0559   CL 106 02/09/2017 0559   CO2 26 02/09/2017 0559   GLUCOSE 105 (H) 02/09/2017 0559   BUN 20 02/09/2017 0559   CREATININE 1.18 (H) 02/09/2017 0559   CALCIUM 9.0 02/09/2017 0559   GFRNONAA 43 (L) 02/09/2017 0559   GFRAA 50 (L) 02/09/2017 0559     Assessment/Plan: 2 Days Post-Op   Principal Problem:   Primary osteoarthritis of left hip Active Problems:   Osteoarthritis of left hip   WBAT with walker ASA, SCDs, TEDs PO pain control PT/OT ABLA: received 2 units PRCs for hgb 6.5 yesterday, hgb 10.5 today Dispo: D/C home today with HEP   Iline OvenBrian J Reona Zendejas 02/10/2017, 8:25 AM   Samson FredericBrian Homer Pfeifer, MD Cell [phone]

## 2017-02-10 NOTE — Progress Notes (Signed)
Physical Therapy Treatment Patient Details Name: [name] MRN: [id number] DOB: 02/02/1941 Today's Date: 02/10/2017    History of Present Illness Pt s/p L THR and with hx of R THR (4/18), CKD, CHF, A-fib, aortic valve replacement and pacemaker    PT Comments    POD # 2 am session Assisted with amb a greater distance in hallway then performed some THR TE's followed by ICE.   Follow Up Recommendations  DC plan and follow up therapy as arranged by surgeon     Equipment Recommendations  None recommended by PT    Recommendations for Other Services       Precautions / Restrictions Precautions Precautions: Fall Restrictions Weight Bearing Restrictions: No Other Position/Activity Restrictions: WBAT    Mobility  Bed Mobility               General bed mobility comments: OOB in recliner  Transfers Overall transfer level: Needs assistance Equipment used: Rolling walker (2 wheeled) Transfers: Sit to/from Stand Sit to Stand: Min guard         General transfer comment: cues for LE management and use of UEs to self assist  Ambulation/Gait Ambulation/Gait assistance: Min guard Ambulation Distance (Feet): 25 Feet Assistive device: Rolling walker (2 wheeled) Gait Pattern/deviations: Step-to pattern;Decreased step length - right;Decreased step length - left;Shuffle;Trunk flexed Gait velocity: decr   General Gait Details: cues for sequence, posture and position from [employer]   Stairs            Wheelchair Mobility    Modified Rankin (Stroke Patients Only)       Balance                                            Cognition Arousal/Alertness: Awake/alert Behavior During Therapy: WFL for tasks assessed/performed Overall Cognitive Status: Within Functional Limits for tasks assessed                                        Exercises  10 reps AP, knee presses    General Comments        Pertinent Vitals/Pain Pain  Assessment: 0-10 Pain Score: 6  Pain Location: L hip Pain Descriptors / Indicators: Aching;Sore;Burning Pain Intervention(s): Monitored during session;Repositioned;Ice applied;Premedicated before session    Home Living                      Prior Function            PT Goals (current goals can now be found in the care plan section) Progress towards PT goals: Progressing toward goals    Frequency    7X/week      PT Plan Current plan remains appropriate    Co-evaluation              AM-PAC PT "6 Clicks" Daily Activity  Outcome Measure  Difficulty turning over in bed (including adjusting bedclothes, sheets and blankets)?: Unable Difficulty moving from lying on back to sitting on the side of the bed? : Unable Difficulty sitting down on and standing up from a chair with arms (e.g., wheelchair, bedside commode, etc,.)?: Unable Help needed moving to and from a bed to chair (including a wheelchair)?: Total Help needed walking in hospital room?: A Lot Help needed climbing 3-5 steps  with a railing? : Total 6 Click Score: 7    End of Session Equipment Utilized During Treatment: Gait belt Activity Tolerance: Patient limited by fatigue;Patient limited by pain Patient left: in chair;with call bell/phone within reach;with chair alarm set Nurse Communication: Mobility status PT Visit Diagnosis: Difficulty in walking, not elsewhere classified (R26.2);Pain Pain - Right/Left: Left Pain - part of body: Hip     Time: 1130-1149 PT Time Calculation (min) (ACUTE ONLY): 19 min  Charges:  $Gait Training: 8-22 mins                    G Codes:       [name]  PTA [facility]  Acute  Rehab Pager      [phone]

## 2017-03-12 ENCOUNTER — Encounter: Payer: Self-pay | Admitting: *Deleted

## 2017-03-20 ENCOUNTER — Encounter: Payer: Self-pay | Admitting: Cardiovascular Disease

## 2017-03-27 ENCOUNTER — Encounter: Payer: Medicare Other | Admitting: Internal Medicine

## 2017-04-11 ENCOUNTER — Encounter: Payer: Self-pay | Admitting: Cardiovascular Disease

## 2017-04-18 ENCOUNTER — Ambulatory Visit (INDEPENDENT_AMBULATORY_CARE_PROVIDER_SITE_OTHER): Payer: Medicare Other | Admitting: Cardiovascular Disease

## 2017-04-18 ENCOUNTER — Encounter: Payer: Self-pay | Admitting: Cardiovascular Disease

## 2017-04-18 ENCOUNTER — Other Ambulatory Visit: Payer: Self-pay

## 2017-04-18 VITALS — BP 116/70 | HR 70 | Ht 64.5 in | Wt 134.0 lb

## 2017-04-18 DIAGNOSIS — I350 Nonrheumatic aortic (valve) stenosis: Secondary | ICD-10-CM | POA: Diagnosis not present

## 2017-04-18 LAB — CBC WITH DIFFERENTIAL/PLATELET
BASOS: 0 %
Basophils Absolute: 0 10*3/uL (ref 0.0–0.2)
EOS (ABSOLUTE): 0 10*3/uL (ref 0.0–0.4)
EOS: 1 %
HEMATOCRIT: 36.2 % (ref 34.0–46.6)
Hemoglobin: 12.2 g/dL (ref 11.1–15.9)
IMMATURE GRANS (ABS): 0 10*3/uL (ref 0.0–0.1)
IMMATURE GRANULOCYTES: 0 %
LYMPHS: 27 %
Lymphocytes Absolute: 1.7 10*3/uL (ref 0.7–3.1)
MCH: 31.9 pg (ref 26.6–33.0)
MCHC: 33.7 g/dL (ref 31.5–35.7)
MCV: 95 fL (ref 79–97)
MONOS ABS: 0.4 10*3/uL (ref 0.1–0.9)
Monocytes: 7 %
Neutrophils Absolute: 4.2 10*3/uL (ref 1.4–7.0)
Neutrophils: 65 %
PLATELETS: 159 10*3/uL (ref 150–379)
RBC: 3.83 x10E6/uL (ref 3.77–5.28)
RDW: 15.6 % — AB (ref 12.3–15.4)
WBC: 6.3 10*3/uL (ref 3.4–10.8)

## 2017-04-18 MED ORDER — APIXABAN 2.5 MG PO TABS: 2.5000 mg | ORAL_TABLET | Freq: Two times a day (BID) | ORAL | 11 refills | Status: DC

## 2017-04-18 NOTE — Patient Instructions (Addendum)
Medication Instructions:  1) START ELIQUIS 2.5 mg TWICE DAILY  Labwork: TODAY: CBC  Testing/Procedures: You have an ECHOCARDIOGRAM scheduled Thursday, March 7th. Please arrive by 12:30PM.   You have a REPEAT ECHOCARDIOGRAM scheduled Friday, June 7th. Please arrive by 10:00AM.  Follow-Up: You have an appointment scheduled with Dr. Excell Seltzerooper Friday, June 7th after your repeat echocardiogram.  Any Other Special Instructions Will Be Listed Below (If Applicable).     If you need a refill on your cardiac medications before your next appointment, please call your pharmacy.

## 2017-04-18 NOTE — Progress Notes (Signed)
Cardiology Office Note Date:  04/18/2017   ID:  [name], DOB 06/14/1940, MRN [id number]  PCP:  Ileana Ladd, MD  Cardiologist:  Tonny Bollman, MD    Chief Complaint  Patient presents with  . Discuss AS    Ref Dr. Jacinto [name]     History of Present Illness: [name] is a 77 y.o. female who presents for follow-up of aortic valve disease.  The patient has a history of bioprosthetic aortic valve replacement in 2013 when she was treated with a 21 mm Edwards magna ease bovine pericardial valve and also underwent mitral valve ring annuloplasty.  The patient clinically did well until she required elective right hip replacement in April 2018.  During her postoperative course she developed hemodynamic instability with a junctional escape rhythm and marked bradycardia requiring dopamine for support.  She was found to be anemic in the early postop period.  Her transvalvular gradients were elevated with a mean gradient of 39 mmHg.  There has been some question as to the mechanism of her bioprosthetic aortic valve stenosis.  It is felt that this is probably a combination of patient prosthesis mismatch and possibly some degree of leaflet degeneration and thickening.  It is notable that her gradient has progressively increased over time.  She's here alone today. She is very pleased with her clinical course after undergoing left hip replacement. She underwent permanent pacemaker placement prior to her most recent hip surgery and had an uncomplicated post-operative course. She is now pain-free and feeling very good. She's been participating in rehab and exercising on a regular basis without chest discomfort, shortness of breath, or lightheadedness. She follows up with Dr [name] next week.    Past Medical History:  Diagnosis Date  . Aortic stenosis    2013  . Arthritis    "hips" (12/21/2016)  . Atrial fibrillation [facility])    Status post cardioversion November 2013  . Chronic diastolic CHF  (congestive heart failure) (HCC)   . Chronic kidney disease   . Complication of anesthesia    after hip surgery 06-2016 bp and heart rate dropped pt. has had a pacemaker placed since this event.  . History of blood transfusion 05/2016   "when I had my hip replaced" (12/21/2016)  . Hypertension   . Presence of permanent cardiac pacemaker    medtronic  . Shortness of breath    hx dyspnea and respiratory abnormalities    Past Surgical History:  Procedure Laterality Date  . AORTIC VALVE REPLACEMENT  10/12/2011   Procedure: AORTIC VALVE REPLACEMENT (AVR);  Surgeon: Alleen Borne, MD;  Location: [facility] OR;  Service: Open Heart Surgery;  Laterality: N/A;  . CARDIAC CATHETERIZATION     "I've had several; no OR" (12/21/2016)  . CARDIAC VALVE REPLACEMENT    . CARDIOVERSION  12/26/2011   Procedure: CARDIOVERSION;  Surgeon: Pamella Pert, MD;  Location: [facility] ENDOSCOPY;  Service: Cardiovascular;  Laterality: N/A;  . CORONARY ANGIOGRAPHY N/A 09/26/2016   Procedure: CORONARY ANGIOGRAPHY;  Surgeon: Elder Negus, MD;  Location: [facility] INVASIVE CV LAB;  Service: Cardiovascular;  Laterality: N/A;  . EXPLORATION POST OPERATIVE OPEN HEART  10/12/2011   Procedure: EXPLORATION POST OPERATIVE OPEN HEART;  Surgeon: Alleen Borne, MD;  Location: [facility] OR;  Service: Open Heart Surgery;  Laterality: N/A;  . INSERT / REPLACE / REMOVE PACEMAKER  12/21/2016  . JOINT REPLACEMENT     right hip 06-2016 Left hip 02-08-17 Dr. Linna Caprice  . LEFT AND RIGHT  HEART CATHETERIZATION WITH CORONARY ANGIOGRAM N/A 09/11/2011   Procedure: LEFT AND RIGHT HEART CATHETERIZATION WITH CORONARY ANGIOGRAM;  Surgeon: Pamella Pert, MD;  Location: [facility] CATH LAB;  Service: Cardiovascular;  Laterality: N/A;  . MITRAL VALVE REPAIR  10/12/2011   Procedure: MITRAL VALVE REPAIR (MVR);  Surgeon: Alleen Borne, MD;  Location: [facility] OR;  Service: Open Heart Surgery;  Laterality: N/A;  mitral valve annuloplasty  . PACEMAKER IMPLANT N/A 12/21/2016   Procedure:  PACEMAKER IMPLANT;  Surgeon: Marinus Maw, MD;  Location: [facility] INVASIVE CV LAB;  Service: Cardiovascular;  Laterality: N/A;  . RIGHT HEART CATH N/A 09/26/2016   Procedure: RIGHT HEART CATH;  Surgeon: Elder Negus, MD;  Location: [facility] INVASIVE CV LAB;  Service: Cardiovascular;  Laterality: N/A;  . TEE WITHOUT CARDIOVERSION N/A 08/22/2016   Procedure: TRANSESOPHAGEAL ECHOCARDIOGRAM (TEE);  Surgeon: Yates Decamp, MD;  Location: [facility] ENDOSCOPY;  Service: Cardiovascular;  Laterality: N/A;  . TEE WITHOUT CARDIOVERSION N/A 12/12/2016   Procedure: TRANSESOPHAGEAL ECHOCARDIOGRAM (TEE);  Surgeon: Lars Masson, MD;  Location[facility] ENDOSCOPY;  Service: Cardiovascular;  Laterality: N/A;  . TOTAL HIP ARTHROPLASTY Right 06/15/2016   Procedure: RIGHT TOTAL HIP ARTHROPLASTY ANTERIOR APPROACH;  Surgeon: Samson Frederic, MD;  Location: [facility] ORS;  Service: Orthopedics;  Laterality: Right;  Dr. needs RNFA  . TOTAL HIP ARTHROPLASTY Left 02/08/2017   Procedure: LEFT TOTAL HIP ARTHROPLASTY ANTERIOR APPROACH;  Surgeon: Samson Frederic, MD;  Location: [facility] ORS;  Service: Orthopedics;  Laterality: Left;  Needs RNFA    Current Outpatient Medications  Medication Sig Dispense Refill  . amLODipine (NORVASC) 10 MG tablet Take 10 mg by mouth daily.    Marland Kitchen aspirin 81 MG chewable tablet Chew 1 tablet (81 mg total) by mouth 2 (two) times daily. 60 tablet 1  . Multiple Vitamin (MULTIVITAMIN WITH MINERALS) TABS tablet Take 1 tablet by mouth daily.    Marland Kitchen spironolactone (ALDACTONE) 25 MG tablet Take 25 mg by mouth daily.    . vitamin C (ASCORBIC ACID) 500 MG tablet Take 500 mg by mouth daily.     No current facility-administered medications for this visit.     Allergies:   Patient has no known allergies.  Social History:  The patient  reports that  has never smoked. she has never used smokeless tobacco. She reports that she does not drink alcohol or use drugs.   Family History:  The patient's family history includes Hypertension in  her mother.    ROS:  Please see the history of present illness.  All other systems are reviewed and negative.    PHYSICAL EXAM: VS:  BP 116/70   Pulse 70   Ht 5' 4.5" (1.638 m)   Wt 134 lb (60.8 kg)   BMI 22.65 kg/m  , BMI Body mass index is 22.65 kg/m. GEN: Well nourished, well developed, in no acute distress  HEENT: normal  Neck: no JVD, no masses. Cardiac: RRR with 3/6 systolic murmur at the RUSB, no diastolic murmur              Respiratory:  clear to auscultation bilaterally, normal work of breathing GI: soft, nontender, nondistended, + BS MS: no deformity or atrophy  Ext: no pretibial edema, pedal pulses 2+= bilaterally Skin: warm and dry, no rash Neuro:  Strength and sensation are intact Psych: euthymic mood, full affect  EKG:  EKG is not ordered today.  Recent Labs: 06/18/2016: Magnesium 1.5 02/09/2017: BUN 20; Creatinine, Ser 1.18; Potassium 4.8; Sodium 136 02/10/2017: Hemoglobin 10.5;  Platelets 96   Lipid Panel     Component Value Date/Time   CHOL 141 08/22/2011 0635   TRIG 72 08/22/2011 0635   HDL 67 08/22/2011 0635   CHOLHDL 2.1 08/22/2011 0635   VLDL 14 08/22/2011 0635   LDLCALC 60 08/22/2011 0635      Wt Readings from Last 3 Encounters:  04/18/17 134 lb (60.8 kg)  02/08/17 127 lb (57.6 kg)  02/06/17 127 lb (57.6 kg)     Cardiac Studies Reviewed: TEE 12-12-2016: Study Conclusions  - Left ventricle: There was mild concentric hypertrophy. Systolic   function was normal. The estimated ejection fraction was in the   range of 60% to 65%. Wall motion was normal; there were no   regional wall motion abnormalities. - Aortic valve: A bioprosthetic [facility] Ease 21 mm sits well   in the aortic position. Leaflets open well. There is no central   aortic regurgitation or paravalvular leak. Transaortic gradients   are severely elevated with peak/mean 126/44 mmHg. There was   severe stenosis. - Aorta: There was mild non-mobile atheroma. - Ascending  aorta: The ascending aorta was normal in size. - Descending aorta: The descending aorta was normal in size. - Mitral valve: S/p annuloplasty ring 28 mm. - Left atrium: The atrium was dilated. No evidence of thrombus in   the atrial cavity or appendage. - Right atrium: The atrium was dilated. No evidence of thrombus in   the atrial cavity or appendage. - Tricuspid valve: There was moderate regurgitation. - Pulmonary arteries: Systolic pressure was moderately increased.   PA peak pressure: 53 mm Hg (S).  Impressions:  - A bioprosthetic [facility] Ease 21 mm sits well in the aortic   position. Leaflets open well. There is no central aortic   regurgitation or paravalvular leak. Transaortic gradients are   severely elevated with peak/mean 126/44 mmHg. Good leaflet   opening. Small ring. No obvious calcifications or pannus.Possible   prosthesis to patient mismatch.   S/p annuloplasty ring 28 mm. Normal transmitral gradients. No   mitral regurgitation or paravalvular leak.   ASSESSMENT AND PLAN: 77 year old woman with severe, stage C, bioprosthetic aortic valve stenosis.  The patient is doing remarkably well and is completely asymptomatic at this time.  However, as outlined above the patient has developed progressive increase in transaortic valve gradients over time, with most recent assessment by TEE in October 2018 demonstrating a mean transvalvular gradient of 44 mmHg.  We have reviewed her case with our multidisciplinary heart valve team and have sent her imaging studies before a second opinion to [facility] in [place].  It appears that there is some degree of patient prosthesis mismatch but also may be a component of leaflet thickening.  In patients with baseline mismatch it may not take marked changes in leaflet mobility to cause significant changes in gradients.  After review of options, it seems most prudent to treat her with a period of oral anticoagulation and  reassessment by echo.  While warfarin would be preferred, but this is not a suitable option for the patient who has had a very difficult time with warfarin in the past and also has a lot of restrictions with her transportation.  Therefore, will treat her with apixaban 2.5 mg twice daily in addition to aspirin 81 mg.  An updated echocardiogram will be ordered now and then a repeat study will be planned in 3-4 months.  I will see her back on the same day of  her repeat echo study.  All of her questions are answered.  I discussed her case with Dr. Jacinto HalimGanji this morning.  Current medicines are reviewed with the patient today.  The patient does not have concerns regarding medicines.  Labs/ tests ordered today include:  No orders of the defined types were placed in this encounter.   Disposition:   FU as above  Signed, Tonny BollmanMichael Sandhya Denherder, MD  04/18/2017 10:19 AM    [facility] [address], [place], [place]  [address] Phone: [phone]; Fax: [phone]

## 2017-04-26 ENCOUNTER — Ambulatory Visit: Payer: Medicare Other | Admitting: Cardiovascular Disease

## 2017-04-26 ENCOUNTER — Ambulatory Visit (HOSPITAL_COMMUNITY): Payer: Medicare Other | Attending: Cardiology

## 2017-04-26 ENCOUNTER — Other Ambulatory Visit: Payer: Self-pay

## 2017-04-26 DIAGNOSIS — I083 Combined rheumatic disorders of mitral, aortic and tricuspid valves: Secondary | ICD-10-CM | POA: Diagnosis not present

## 2017-04-26 DIAGNOSIS — I509 Heart failure, unspecified: Secondary | ICD-10-CM | POA: Insufficient documentation

## 2017-04-26 DIAGNOSIS — I350 Nonrheumatic aortic (valve) stenosis: Secondary | ICD-10-CM

## 2017-04-26 DIAGNOSIS — I130 Hypertensive heart and chronic kidney disease with heart failure and stage 1 through stage 4 chronic kidney disease, or unspecified chronic kidney disease: Secondary | ICD-10-CM | POA: Insufficient documentation

## 2017-04-26 DIAGNOSIS — Z8249 Family history of ischemic heart disease and other diseases of the circulatory system: Secondary | ICD-10-CM | POA: Insufficient documentation

## 2017-05-11 ENCOUNTER — Other Ambulatory Visit: Payer: Self-pay | Admitting: Family Medicine

## 2017-05-11 DIAGNOSIS — E2839 Other primary ovarian failure: Secondary | ICD-10-CM

## 2017-05-11 DIAGNOSIS — Z139 Encounter for screening, unspecified: Secondary | ICD-10-CM

## 2017-07-18 ENCOUNTER — Ambulatory Visit: Payer: Medicare Other

## 2017-07-18 ENCOUNTER — Other Ambulatory Visit: Payer: Medicare Other

## 2017-07-27 ENCOUNTER — Other Ambulatory Visit: Payer: Self-pay

## 2017-07-27 ENCOUNTER — Ambulatory Visit (INDEPENDENT_AMBULATORY_CARE_PROVIDER_SITE_OTHER): Payer: Medicare Other | Admitting: Cardiovascular Disease

## 2017-07-27 ENCOUNTER — Other Ambulatory Visit (HOSPITAL_COMMUNITY): Payer: Medicare Other

## 2017-07-27 ENCOUNTER — Ambulatory Visit (HOSPITAL_COMMUNITY): Payer: Medicare Other | Attending: Internal Medicine

## 2017-07-27 ENCOUNTER — Encounter: Payer: Self-pay | Admitting: Cardiovascular Disease

## 2017-07-27 VITALS — BP 144/84 | HR 94 | Ht 64.6 in | Wt 134.5 lb

## 2017-07-27 DIAGNOSIS — I4891 Unspecified atrial fibrillation: Secondary | ICD-10-CM | POA: Insufficient documentation

## 2017-07-27 DIAGNOSIS — I110 Hypertensive heart disease with heart failure: Secondary | ICD-10-CM | POA: Diagnosis not present

## 2017-07-27 DIAGNOSIS — I350 Nonrheumatic aortic (valve) stenosis: Secondary | ICD-10-CM

## 2017-07-27 DIAGNOSIS — I509 Heart failure, unspecified: Secondary | ICD-10-CM | POA: Diagnosis not present

## 2017-07-27 DIAGNOSIS — I071 Rheumatic tricuspid insufficiency: Secondary | ICD-10-CM | POA: Insufficient documentation

## 2017-07-27 DIAGNOSIS — Z953 Presence of xenogenic heart valve: Secondary | ICD-10-CM | POA: Diagnosis not present

## 2017-07-27 NOTE — Patient Instructions (Signed)
Medication Instructions:  Your provider recommends that you continue on your current medications as directed. Please refer to the Current Medication list given to you today.    Labwork: None  Testing/Procedures: None  Follow-Up: Your provider recommends that you schedule a follow-up appointment AS NEEDED with Dr. Cooper.  Any Other Special Instructions Will Be Listed Below (If Applicable).     If you need a refill on your cardiac medications before your next appointment, please call your pharmacy.   

## 2017-07-27 NOTE — Progress Notes (Signed)
Cardiology Office Note Date:  07/27/2017   ID:  [name], DOB 10/16/1940, MRN [id number]  PCP:  Ileana LaddWong, Francis P, MD  Cardiologist:  Tonny BollmanMichael Kima Malenfant, MD    Chief Complaint  Patient presents with  . Follow-up    Aortic Valve Disease     History of Present Illness: [name] is a 77 y.o. female who presents for follow-up of aortic valve disease.   The patient has a history of bioprosthetic aortic valve replacement in 2013 when she was treated with a 21 mm Edwards magna ease bovine pericardial valve and also underwent mitral valve ring annuloplasty.  The patient clinically did well until she required elective right hip replacement in April 2018.  During her postoperative course she developed hemodynamic instability with a junctional escape rhythm and marked bradycardia requiring dopamine for support.  She was found to be anemic in the early postop period.  Her transvalvular gradients were elevated with a mean gradient of 39 mmHg.  There has been some question as to the mechanism of her bioprosthetic aortic valve stenosis.  It is felt that this is probably a combination of patient prosthesis mismatch and possibly some degree of leaflet degeneration and thickening.  It is notable that her gradient has progressively increased over time.  When she was last seen in February 2019 she was started on apixaban for empiric treatment of potential subacute valve thrombosis.  She returns today with a repeat echocardiogram and follow-up visit.  The patient is here alone today.  States she is doing great.  She feels really well and has been able to be much more active since undergoing left hip replacement.  She denies any cardiopulmonary symptoms.  She is tolerating apixaban without bleeding or bruising problems.  She specifically denies chest pain, chest pressure, shortness of breath, or leg swelling.  She has had no lightheadedness or dizziness.    Past Medical History:  Diagnosis Date  .  Aortic stenosis    2013  . Arthritis    "hips" (12/21/2016)  . Atrial fibrillation [facility](HCC)    Status post cardioversion November 2013  . Chronic diastolic CHF (congestive heart failure) (HCC)   . Chronic kidney disease   . Complication of anesthesia    after hip surgery 06-2016 bp and heart rate dropped pt. has had a pacemaker placed since this event.  . History of blood transfusion 05/2016   "when I had my hip replaced" (12/21/2016)  . Hypertension   . Presence of permanent cardiac pacemaker    medtronic  . Shortness of breath    hx dyspnea and respiratory abnormalities    Past Surgical History:  Procedure Laterality Date  . AORTIC VALVE REPLACEMENT  10/12/2011   Procedure: AORTIC VALVE REPLACEMENT (AVR);  Surgeon: Alleen BorneBryan K Bartle, MD;  [facility];  Service: Open Heart Surgery;  Laterality: N/A;  . CARDIAC CATHETERIZATION     "I've had several; no OR" (12/21/2016)  . CARDIAC VALVE REPLACEMENT    . CARDIOVERSION  12/26/2011   Procedure: CARDIOVERSION;  Surgeon: Pamella PertJagadeesh R Ganji, MD;  [facility];  Service: Cardiovascular;  Laterality: N/A;  . CORONARY ANGIOGRAPHY N/A 09/26/2016   Procedure: CORONARY ANGIOGRAPHY;  Surgeon: Elder NegusPatwardhan, Manish J, MD;  Location: [facility] INVASIVE CV LAB;  Service: Cardiovascular;  Laterality: N/A;  . EXPLORATION POST OPERATIVE OPEN HEART  10/12/2011   Procedure: EXPLORATION POST OPERATIVE OPEN HEART;  Surgeon: Alleen BorneBryan K Bartle, MD;  Location: [facility] OR;  Service: Open Heart Surgery;  Laterality:  N/A;  . INSERT / REPLACE / REMOVE PACEMAKER  12/21/2016  . JOINT REPLACEMENT     right hip 06-2016 Left hip 02-08-17 Dr. Linna Caprice  . LEFT AND RIGHT HEART CATHETERIZATION WITH CORONARY ANGIOGRAM N/A 09/11/2011   Procedure: LEFT AND RIGHT HEART CATHETERIZATION WITH CORONARY ANGIOGRAM;  Surgeon: Pamella Pert, MD;  Location: [facility] CATH LAB;  Service: Cardiovascular;  Laterality: N/A;  . MITRAL VALVE REPAIR  10/12/2011   Procedure: MITRAL VALVE REPAIR (MVR);  Surgeon:  Alleen Borne, MD;  Location: [facility] OR;  Service: Open Heart Surgery;  Laterality: N/A;  mitral valve annuloplasty  . PACEMAKER IMPLANT N/A 12/21/2016   Procedure: PACEMAKER IMPLANT;  Surgeon: Marinus Maw, MD;  Location: [facility] INVASIVE CV LAB;  Service: Cardiovascular;  Laterality: N/A;  . RIGHT HEART CATH N/A 09/26/2016   Procedure: RIGHT HEART CATH;  Surgeon: Elder Negus, MD;  Location: [facility] INVASIVE CV LAB;  Service: Cardiovascular;  Laterality: N/A;  . TEE WITHOUT CARDIOVERSION N/A 08/22/2016   Procedure: TRANSESOPHAGEAL ECHOCARDIOGRAM (TEE);  Surgeon: Yates Decamp, MD;  Location: [facility] ENDOSCOPY;  Service: Cardiovascular;  Laterality: N/A;  . TEE WITHOUT CARDIOVERSION N/A 12/12/2016   Procedure: TRANSESOPHAGEAL ECHOCARDIOGRAM (TEE);  Surgeon: Lars Masson, MD;  Location: [facility] ENDOSCOPY;  Service: Cardiovascular;  Laterality: N/A;  . TOTAL HIP ARTHROPLASTY Right 06/15/2016   Procedure: RIGHT TOTAL HIP ARTHROPLASTY ANTERIOR APPROACH;  Surgeon: Samson Frederic, MD;  Location: [facility] ORS;  Service: Orthopedics;  Laterality: Right;  Dr. needs RNFA  . TOTAL HIP ARTHROPLASTY Left 02/08/2017   Procedure: LEFT TOTAL HIP ARTHROPLASTY ANTERIOR APPROACH;  Surgeon: Samson Frederic, MD;  Location: [facility] ORS;  Service: Orthopedics;  Laterality: Left;  Needs RNFA    Current Outpatient Medications  Medication Sig Dispense Refill  . amLODipine (NORVASC) 10 MG tablet Take 10 mg by mouth daily.    . Multiple Vitamin (MULTIVITAMIN WITH MINERALS) TABS tablet Take 1 tablet by mouth daily.    Marland Kitchen spironolactone (ALDACTONE) 25 MG tablet Take 25 mg by mouth daily.    . vitamin C (ASCORBIC ACID) 500 MG tablet Take 500 mg by mouth daily.     No current facility-administered medications for this visit.     Allergies:   Patient has no known allergies.   Social History:  The patient  reports that she has never smoked. She has never used smokeless tobacco. She reports that she does not drink alcohol or use drugs.   Family  History:  The patient's family history includes Hypertension in her mother.    ROS:  Please see the history of present illness. All other systems are reviewed and negative.    PHYSICAL EXAM: VS:  BP (!) 144/84   Pulse 94   Ht 5' 4.6" (1.641 m)   Wt 134 lb 8 oz (61 kg)   SpO2 98%   BMI 22.66 kg/m  , BMI Body mass index is 22.66 kg/m. GEN: Well nourished, well developed, in no acute distress  HEENT: normal  Neck: no JVD, no masses.  Cardiac: RRR with harsh 3/6 systolic murmur at the LLSB             Respiratory:  clear to auscultation bilaterally, normal work of breathing GI: soft, nontender, nondistended, + BS MS: no deformity or atrophy  Ext: no pretibial edema Skin: warm and dry, no rash Neuro:  Strength and sensation are intact Psych: euthymic mood, full affect  EKG:  EKG is not ordered today.  Recent Labs: 02/09/2017: BUN 20; Creatinine, Ser  1.18; Potassium 4.8; Sodium 136 04/18/2017: Hemoglobin 12.2; Platelets 159   Lipid Panel     Component Value Date/Time   CHOL 141 08/22/2011 0635   TRIG 72 08/22/2011 0635   HDL 67 08/22/2011 0635   CHOLHDL 2.1 08/22/2011 0635   VLDL 14 08/22/2011 0635   LDLCALC 60 08/22/2011 0635      Wt Readings from Last 3 Encounters:  07/27/17 134 lb 8 oz (61 kg)  04/18/17 134 lb (60.8 kg)  02/08/17 127 lb (57.6 kg)     Cardiac Studies Reviewed: Today's echo study is reviewed.  The formal interpretation is pending.  Her LV function appears normal with a septal bounce.  LVEF is normal.  Peak and mean transaortic valve gradients are 59 and 31 mmHg with a peak systolic velocity of 3.8 m/s and a dimensionless index of 0.25.  This is compared to her echo study from 3 months ago (prior to apixaban) which showed peak and mean gradients of 80 and 46 mm, respectively and a peak systolic velocity of 4.4 m/s.  ASSESSMENT AND PLAN: This is a 77 year old woman with stage C bioprosthetic aortic valve stenosis, moderate to severe.  The patient likely  has a combination of patient-prosthesis mismatch as well as a degree of degenerative bioprosthetic valve changes.  Her transvalvular gradients have decreased significantly on apixaban with mean gradient decreasing from 46 to 31 mmHg.  I suspect there is also a significant flow related component because the patient's LVOT/aortic valve velocity ratio has not really changed.  The patient is completely asymptomatic and seems to be tolerating apixaban quite well.  I would recommend continuing her current therapy.  She will continue to follow regularly with Dr. Jacinto Halim who she is scheduled to see in September of this year.  I spoke with Dr. Jacinto Halim who will follow serial echo studies and we would be happy to see the patient back in the future if she develops symptoms or has echo signs of significant progression of her bioprosthetic aortic valve stenosis.  Current medicines are reviewed with the patient today.  The patient does not have concerns regarding medicines.  Labs/ tests ordered today include:  No orders of the defined types were placed in this encounter.  Disposition:   FU prn  Signed, Tonny Bollman, MD  07/27/2017 1:05 PM    [facility] [address] [place], [place], [place]  [address] Phone: [phone]; Fax: [phone]

## 2017-08-06 ENCOUNTER — Other Ambulatory Visit (HOSPITAL_COMMUNITY): Payer: Medicare Other

## 2017-10-15 ENCOUNTER — Ambulatory Visit
Admission: RE | Admit: 2017-10-15 | Discharge: 2017-10-15 | Disposition: A | Discharge status: Home / Self Care | Payer: Medicare Other | Source: Ambulatory Visit | Attending: Family Medicine | Admitting: Family Medicine

## 2017-10-15 DIAGNOSIS — E2839 Other primary ovarian failure: Secondary | ICD-10-CM

## 2017-10-15 DIAGNOSIS — Z139 Encounter for screening, unspecified: Secondary | ICD-10-CM

## 2018-04-02 ENCOUNTER — Other Ambulatory Visit: Payer: Self-pay | Admitting: Cardiology

## 2018-04-24 ENCOUNTER — Ambulatory Visit: Payer: Self-pay

## 2018-04-24 ENCOUNTER — Telehealth: Payer: Self-pay

## 2018-04-24 NOTE — Telephone Encounter (Signed)
04/24/18 called patient to r/s todays nurse visit as she missed this appointment. Patient will be going out of town and will call back to rs today's pacer check appointment. She is aware of Aprils appointment with Dr Jacinto Halim.

## 2018-05-09 ENCOUNTER — Observation Stay: Payer: Medicare Other

## 2018-05-09 ENCOUNTER — Observation Stay
Admission: EM | Admit: 2018-05-09 | Discharge: 2018-05-10 | Disposition: A | Discharge status: Home / Self Care | Payer: Medicare Other | Attending: Internal Medicine | Admitting: Internal Medicine

## 2018-05-09 ENCOUNTER — Encounter: Payer: Self-pay | Admitting: Emergency Medicine

## 2018-05-09 ENCOUNTER — Emergency Department: Payer: Medicare Other

## 2018-05-09 ENCOUNTER — Other Ambulatory Visit: Payer: Self-pay

## 2018-05-09 DIAGNOSIS — I130 Hypertensive heart and chronic kidney disease with heart failure and stage 1 through stage 4 chronic kidney disease, or unspecified chronic kidney disease: Secondary | ICD-10-CM | POA: Insufficient documentation

## 2018-05-09 DIAGNOSIS — T68XXXA Hypothermia, initial encounter: Secondary | ICD-10-CM | POA: Diagnosis not present

## 2018-05-09 DIAGNOSIS — Z952 Presence of prosthetic heart valve: Secondary | ICD-10-CM | POA: Insufficient documentation

## 2018-05-09 DIAGNOSIS — Z79899 Other long term (current) drug therapy: Secondary | ICD-10-CM | POA: Diagnosis not present

## 2018-05-09 DIAGNOSIS — Z950 Presence of cardiac pacemaker: Secondary | ICD-10-CM | POA: Diagnosis not present

## 2018-05-09 DIAGNOSIS — N182 Chronic kidney disease, stage 2 (mild): Secondary | ICD-10-CM | POA: Diagnosis not present

## 2018-05-09 DIAGNOSIS — Z7901 Long term (current) use of anticoagulants: Secondary | ICD-10-CM | POA: Insufficient documentation

## 2018-05-09 DIAGNOSIS — N3000 Acute cystitis without hematuria: Principal | ICD-10-CM | POA: Diagnosis present

## 2018-05-09 DIAGNOSIS — I480 Paroxysmal atrial fibrillation: Secondary | ICD-10-CM | POA: Diagnosis not present

## 2018-05-09 DIAGNOSIS — I5032 Chronic diastolic (congestive) heart failure: Secondary | ICD-10-CM | POA: Diagnosis not present

## 2018-05-09 DIAGNOSIS — Z66 Do not resuscitate: Secondary | ICD-10-CM | POA: Insufficient documentation

## 2018-05-09 DIAGNOSIS — Z8249 Family history of ischemic heart disease and other diseases of the circulatory system: Secondary | ICD-10-CM | POA: Diagnosis not present

## 2018-05-09 DIAGNOSIS — Z96643 Presence of artificial hip joint, bilateral: Secondary | ICD-10-CM | POA: Insufficient documentation

## 2018-05-09 DIAGNOSIS — I639 Cerebral infarction, unspecified: Secondary | ICD-10-CM

## 2018-05-09 DIAGNOSIS — R112 Nausea with vomiting, unspecified: Secondary | ICD-10-CM | POA: Diagnosis not present

## 2018-05-09 DIAGNOSIS — X58XXXA Exposure to other specified factors, initial encounter: Secondary | ICD-10-CM | POA: Diagnosis not present

## 2018-05-09 LAB — URINALYSIS, COMPLETE (UACMP) WITH MICROSCOPIC
Bilirubin Urine: NEGATIVE
GLUCOSE, UA: NEGATIVE mg/dL
HGB URINE DIPSTICK: NEGATIVE
Ketones, ur: NEGATIVE mg/dL
Nitrite: NEGATIVE
Protein, ur: 30 mg/dL — AB
SPECIFIC GRAVITY, URINE: 1.017 (ref 1.005–1.030)
pH: 5 (ref 5.0–8.0)

## 2018-05-09 LAB — CBC WITH DIFFERENTIAL/PLATELET
Abs Immature Granulocytes: 0.01 10*3/uL (ref 0.00–0.07)
BASOS ABS: 0 10*3/uL (ref 0.0–0.1)
Basophils Relative: 0 %
EOS ABS: 0.1 10*3/uL (ref 0.0–0.5)
Eosinophils Relative: 1 %
HCT: 44.3 % (ref 36.0–46.0)
Hemoglobin: 13.9 g/dL (ref 12.0–15.0)
Immature Granulocytes: 0 %
Lymphocytes Relative: 32 %
Lymphs Abs: 1.6 10*3/uL (ref 0.7–4.0)
MCH: 31.5 pg (ref 26.0–34.0)
MCHC: 31.4 g/dL (ref 30.0–36.0)
MCV: 100.5 fL — ABNORMAL HIGH (ref 80.0–100.0)
Monocytes Absolute: 0.4 10*3/uL (ref 0.1–1.0)
Monocytes Relative: 8 %
NEUTROS PCT: 59 %
NRBC: 0 % (ref 0.0–0.2)
Neutro Abs: 3 10*3/uL (ref 1.7–7.7)
Platelets: 97 10*3/uL — ABNORMAL LOW (ref 150–400)
RBC: 4.41 MIL/uL (ref 3.87–5.11)
RDW: 14.1 % (ref 11.5–15.5)
WBC: 5.1 10*3/uL (ref 4.0–10.5)

## 2018-05-09 LAB — GASTROINTESTINAL PANEL BY PCR, STOOL (REPLACES STOOL CULTURE)

## 2018-05-09 LAB — C DIFFICILE QUICK SCREEN W PCR REFLEX
C DIFFICILE (CDIFF) INTERP: NOT DETECTED
C DIFFICLE (CDIFF) ANTIGEN: NEGATIVE
C Diff toxin: NEGATIVE

## 2018-05-09 LAB — LACTIC ACID, PLASMA
LACTIC ACID, VENOUS: 3 mmol/L — AB (ref 0.5–1.9)
LACTIC ACID, VENOUS: 2.1 mmol/L — AB (ref 0.5–1.9)
Lactic Acid, Venous: 3.1 mmol/L (ref 0.5–1.9)

## 2018-05-09 LAB — COMPREHENSIVE METABOLIC PANEL
ALK PHOS: 46 U/L (ref 38–126)
ALT: 13 U/L (ref 0–44)
AST: 18 U/L (ref 15–41)
Albumin: 3.9 g/dL (ref 3.5–5.0)
Anion gap: 6 (ref 5–15)
BUN: 25 mg/dL — AB (ref 8–23)
CALCIUM: 9.3 mg/dL (ref 8.9–10.3)
CO2: 24 mmol/L (ref 22–32)
Chloride: 108 mmol/L (ref 98–111)
Creatinine, Ser: 1.05 mg/dL — ABNORMAL HIGH (ref 0.44–1.00)
GFR calc Af Amer: 59 mL/min — ABNORMAL LOW (ref >60)
GFR calc non Af Amer: 51 mL/min — ABNORMAL LOW (ref >60)
Glucose, Bld: 136 mg/dL — ABNORMAL HIGH (ref 70–99)
Potassium: 4.4 mmol/L (ref 3.5–5.1)
Sodium: 138 mmol/L (ref 135–145)
Total Bilirubin: 0.5 mg/dL (ref 0.3–1.2)
Total Protein: 7.2 g/dL (ref 6.5–8.1)

## 2018-05-09 LAB — LIPASE, BLOOD: Lipase: 41 U/L (ref 11–51)

## 2018-05-09 MED ORDER — SODIUM CHLORIDE 0.9 % IV SOLN
1.0000 g | INTRAVENOUS | Status: DC
  Filled 2018-05-09: qty 10

## 2018-05-09 MED ORDER — ACETAMINOPHEN 325 MG PO TABS: 650.0000 mg | ORAL_TABLET | Freq: Four times a day (QID) | ORAL | Status: DC | PRN

## 2018-05-09 MED ORDER — ONDANSETRON HCL 4 MG PO TABS: 4.0000 mg | ORAL_TABLET | Freq: Four times a day (QID) | ORAL | Status: DC | PRN

## 2018-05-09 MED ORDER — AMLODIPINE BESYLATE 10 MG PO TABS
10.0000 mg | ORAL_TABLET | Freq: Every day | ORAL | Status: DC
  Filled 2018-05-09: qty 1

## 2018-05-09 MED ORDER — ENOXAPARIN SODIUM 40 MG/0.4ML ~~LOC~~ SOLN: 40.0000 mg | SUBCUTANEOUS | Status: DC

## 2018-05-09 MED ORDER — ONDANSETRON HCL 4 MG/2ML IJ SOLN: 4.0000 mg | Freq: Four times a day (QID) | INTRAMUSCULAR | Status: DC | PRN

## 2018-05-09 MED ORDER — APIXABAN 5 MG PO TABS
5.0000 mg | ORAL_TABLET | Freq: Two times a day (BID) | ORAL | Status: DC
  Filled 2018-05-09: qty 1

## 2018-05-09 MED ORDER — SODIUM CHLORIDE 0.9 % IV SOLN
1.0000 g | Freq: Once | INTRAVENOUS | Status: AC
  Administered 2018-05-09: 1 g via INTRAVENOUS
  Filled 2018-05-09: qty 10

## 2018-05-09 MED ORDER — SODIUM CHLORIDE 0.9 % IV SOLN
INTRAVENOUS | Status: DC
  Administered 2018-05-09 – 2018-05-10 (×2): via INTRAVENOUS

## 2018-05-09 MED ORDER — ACETAMINOPHEN 650 MG RE SUPP: 650.0000 mg | Freq: Four times a day (QID) | RECTAL | Status: DC | PRN

## 2018-05-09 MED ORDER — APIXABAN 2.5 MG PO TABS
2.5000 mg | ORAL_TABLET | Freq: Two times a day (BID) | ORAL | Status: DC
  Administered 2018-05-09 – 2018-05-10 (×3): 2.5 mg via ORAL
  Filled 2018-05-09 (×3): qty 1

## 2018-05-09 MED ORDER — IOHEXOL 300 MG/ML  SOLN
75.0000 mL | Freq: Once | INTRAMUSCULAR | Status: AC | PRN
  Administered 2018-05-09: 75 mL via INTRAVENOUS

## 2018-05-09 MED ORDER — HYDRALAZINE HCL 20 MG/ML IJ SOLN: 10.0000 mg | Freq: Four times a day (QID) | INTRAMUSCULAR | Status: DC | PRN

## 2018-05-09 MED ORDER — POLYETHYLENE GLYCOL 3350 17 G PO PACK: 17.0000 g | PACK | Freq: Every day | ORAL | Status: DC | PRN

## 2018-05-09 MED ORDER — ADULT MULTIVITAMIN W/MINERALS CH
1.0000 | ORAL_TABLET | Freq: Every day | ORAL | Status: DC
  Administered 2018-05-09 – 2018-05-10 (×2): 1 via ORAL
  Filled 2018-05-09 (×3): qty 1

## 2018-05-09 NOTE — ED Provider Notes (Signed)
[facility] Emergency Department Provider Note    First MD Initiated Contact with Patient 05/09/18 0522     (approximate)  I have reviewed the triage vital signs and the nursing notes.   HISTORY  Chief Complaint Nausea    HPI [name] is a 78 y.o. female with below list of chronic medical conditions presents to the emergency department via EMS stating I just do not feel well".  Patient states that she was in her usual state of health before going to bed last night however awoke this morning "not feeling well" patient unable to elaborate.  Patient states that she subsequently had one episode of vomiting and diarrhea.        Past Medical History:  Diagnosis Date  . Aortic stenosis    2013  . Arthritis    "hips" (12/21/2016)  . Atrial fibrillation [facility])    Status post cardioversion November 2013  . Chronic diastolic CHF (congestive heart failure) (HCC)   . Chronic kidney disease   . Complication of anesthesia    after hip surgery 06-2016 bp and heart rate dropped pt. has had a pacemaker placed since this event.  . History of blood transfusion 05/2016   "when I had my hip replaced" (12/21/2016)  . Hypertension   . Presence of permanent cardiac pacemaker    medtronic  . Shortness of breath    hx dyspnea and respiratory abnormalities    Patient Active Problem List   Diagnosis Date Noted  . Acute cystitis 05/09/2018  . Primary osteoarthritis of left hip 02/08/2017  . Osteoarthritis of left hip 02/08/2017  . Sinus node dysfunction (HCC) 12/21/2016  . Chronic diastolic CHF (congestive heart failure) (HCC)   . 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 Anasarca 08/21/2011  . Hypokalemia 08/21/2011  . Hypoxia 08/18/2011  . Hypertension     Past Surgical History:  Procedure Laterality Date  . AORTIC VALVE REPLACEMENT  10/12/2011   Procedure: AORTIC  VALVE REPLACEMENT (AVR);  Surgeon: Alleen Borne, MD;  Location: [facility] OR;  Service: Open Heart Surgery;  Laterality: N/A;  . CARDIAC CATHETERIZATION     "I've had several; no OR" (12/21/2016)  . CARDIAC VALVE REPLACEMENT    . CARDIOVERSION  12/26/2011   Procedure: CARDIOVERSION;  Surgeon: Pamella Pert, MD;  Location: [facility] ENDOSCOPY;  Service: Cardiovascular;  Laterality: N/A;  . CORONARY ANGIOGRAPHY N/A 09/26/2016   Procedure: CORONARY ANGIOGRAPHY;  Surgeon: Elder Negus, MD;  Location: [facility] INVASIVE CV LAB;  Service: Cardiovascular;  Laterality: N/A;  . EXPLORATION POST OPERATIVE OPEN HEART  10/12/2011   Procedure: EXPLORATION POST OPERATIVE OPEN HEART;  Surgeon: Alleen Borne, MD;  Location: [facility] OR;  Service: Open Heart Surgery;  Laterality: N/A;  . INSERT / REPLACE / REMOVE PACEMAKER  12/21/2016  . JOINT REPLACEMENT     right hip 06-2016 Left hip 02-08-17 Dr. Linna Caprice  . LEFT AND RIGHT HEART CATHETERIZATION WITH CORONARY ANGIOGRAM N/A 09/11/2011   Procedure: LEFT AND RIGHT HEART CATHETERIZATION WITH CORONARY ANGIOGRAM;  Surgeon: Pamella Pert, MD;  Location: [facility] CATH LAB;  Service: Cardiovascular;  Laterality: N/A;  . MITRAL VALVE REPAIR  10/12/2011   Procedure: MITRAL VALVE REPAIR (MVR);  Surgeon: Alleen Borne, MD;  Location: [facility] OR;  Service: Open Heart Surgery;  Laterality: N/A;  mitral valve annuloplasty  . PACEMAKER IMPLANT N/A 12/21/2016   Procedure: PACEMAKER IMPLANT;  Surgeon: Marinus Maw, MD;  Location: [facility] INVASIVE CV LAB;  Service: Cardiovascular;  Laterality: N/A;  . RIGHT HEART CATH N/A 09/26/2016   Procedure: RIGHT HEART CATH;  Surgeon: Elder Negus, MD;  Location: [facility] INVASIVE CV LAB;  Service: Cardiovascular;  Laterality: N/A;  . TEE WITHOUT CARDIOVERSION N/A 08/22/2016   Procedure: TRANSESOPHAGEAL ECHOCARDIOGRAM (TEE);  Surgeon: Yates Decamp, MD;  Location: [facility] ENDOSCOPY;  Service: Cardiovascular;  Laterality: N/A;  . TEE WITHOUT CARDIOVERSION N/A 12/12/2016    Procedure: TRANSESOPHAGEAL ECHOCARDIOGRAM (TEE);  Surgeon: Lars Masson, MD;  Location: [facility] ENDOSCOPY;  Service: Cardiovascular;  Laterality: N/A;  . TOTAL HIP ARTHROPLASTY Right 06/15/2016   Procedure: RIGHT TOTAL HIP ARTHROPLASTY ANTERIOR APPROACH;  Surgeon: Samson Frederic, MD;  Location: [facility] ORS;  Service: Orthopedics;  Laterality: Right;  Dr. needs RNFA  . TOTAL HIP ARTHROPLASTY Left 02/08/2017   Procedure: LEFT TOTAL HIP ARTHROPLASTY ANTERIOR APPROACH;  Surgeon: Samson Frederic, MD;  Location: [facility] ORS;  Service: Orthopedics;  Laterality: Left;  Needs RNFA    Prior to Admission medications   Medication Sig Start Date End Date Taking? Authorizing Provider  alendronate (FOSAMAX) 70 MG tablet Take 70 mg by mouth once a week. 04/22/18  Yes [provider]  ELIQUIS 2.5 MG TABS tablet Take 2.5 mg by mouth 2 (two) times daily. 04/13/18  Yes [provider]  metoprolol succinate (TOPROL-XL) 100 MG 24 hr tablet Take 100 mg by mouth daily. 03/08/18  Yes [provider]  Multiple Vitamin (MULTIVITAMIN WITH MINERALS) TABS tablet Take 1 tablet by mouth daily.   Yes [provider]  spironolactone (ALDACTONE) 25 MG tablet TAKE 1 TABLET BY MOUTH  EVERY MORNING 04/02/18  Yes Vyas, June Leap, MD    Allergies Patient has no known allergies.  Family History  Problem Relation Age of Onset  . Hypertension Mother     Social History Social History   Tobacco Use  . Smoking status: Never Smoker  . Smokeless tobacco: Never Used  Substance Use Topics  . Alcohol use: No  . Drug use: No    Review of Systems Constitutional: No fever/chills Eyes: No visual changes. ENT: No sore throat. Cardiovascular: Denies chest pain. Respiratory: Denies shortness of breath. Gastrointestinal: No abdominal pain.  No nausea, no vomiting.  No diarrhea.  No constipation. Genitourinary: Negative for dysuria. Musculoskeletal: Negative for neck pain.  Negative for back  pain. Integumentary: Negative for rash. Neurological: Negative for headaches, focal weakness or numbness.  ____________________________________________   PHYSICAL EXAM:  VITAL SIGNS: ED Triage Vitals [05/09/18 0515]  Enc Vitals Group     BP (!) 136/94     Pulse Rate 80     Resp 18     Temp      Temp src      SpO2 94 %     Weight      Height      Head Circumference      Peak Flow      Pain Score 0     Pain Loc      Pain Edu?      Excl. in GC?     Constitutional: Alert and oriented. Well appearing and in no acute distress. Eyes: Conjunctivae are normal.  Mouth/Throat: Mucous membranes are moist. Oropharynx non-erythematous. Neck: No stridor.   Cardiovascular: Normal rate, regular rhythm. Good peripheral circulation. Grossly normal heart sounds. Respiratory: Normal respiratory effort.  No retractions. Lungs CTAB. Gastrointestinal: Soft and nontender. No distention.  Musculoskeletal: No lower extremity  tenderness nor edema. No gross deformities of extremities. Neurologic:  Normal speech and language. No gross focal neurologic deficits are appreciated.  Skin:  Skin is warm, dry and intact. No rash noted. Psychiatric: Mood and affect are normal. Speech and behavior are normal.  ____________________________________________   LABS (all labs ordered are listed, but only abnormal results are displayed)  Labs Reviewed  CBC WITH DIFFERENTIAL/PLATELET - Abnormal; Notable for the following components:      Result Value   MCV 100.5 (*)    Platelets 97 (*)    All other components within normal limits  URINALYSIS, COMPLETE (UACMP) WITH MICROSCOPIC - Abnormal; Notable for the following components:   Color, Urine YELLOW (*)    APPearance CLOUDY (*)    Protein, ur 30 (*)    Leukocytes,Ua MODERATE (*)    Bacteria, UA RARE (*)    All other components within normal limits  LACTIC ACID, PLASMA - Abnormal; Notable for the following components:   Lactic Acid, Venous 3.0 (*)    All  other components within normal limits  COMPREHENSIVE METABOLIC PANEL - Abnormal; Notable for the following components:   Glucose, Bld 136 (*)    BUN 25 (*)    Creatinine, Ser 1.05 (*)    GFR calc non Af Amer 51 (*)    GFR calc Af Amer 59 (*)    All other components within normal limits  LACTIC ACID, PLASMA - Abnormal; Notable for the following components:   Lactic Acid, Venous 3.1 (*)    All other components within normal limits  LACTIC ACID, PLASMA - Abnormal; Notable for the following components:   Lactic Acid, Venous 2.1 (*)    All other components within normal limits  BASIC METABOLIC PANEL - Abnormal; Notable for the following components:   Chloride 113 (*)    BUN 25 (*)    GFR calc non Af Amer 55 (*)    Anion gap 4 (*)    All other components within normal limits  GASTROINTESTINAL PANEL BY PCR, STOOL (REPLACES STOOL CULTURE)  C DIFFICILE QUICK SCREEN W PCR REFLEX  CULTURE, BLOOD (ROUTINE X 2)  CULTURE, BLOOD (ROUTINE X 2)  URINE CULTURE  LIPASE, BLOOD   ____________________________________________  EKG ED ECG REPORT I, Schubert N BROWN, the attending physician, personally viewed and interpreted this ECG.   Date: 05/09/2018  EKG Time: 5:13 AM  Rate: 73  Rhythm: AV dual paced rhythm  Axis: Normal  Intervals: Normal  ST&T Change: None  RADIOLOGY I,  N BROWN, personally viewed and evaluated these images (plain radiographs) as part of my medical decision making, as well as reviewing the written report by the radiologist.  ED MD interpretation: No acute finding CT abdomen pelvis.  Official radiology report(s): Ct Abdomen Pelvis W Contrast  Result Date: 05/09/2018 CLINICAL DATA:  Generalized unwell feeling. Episode of vomiting and diarrhea. EXAM: CT ABDOMEN AND PELVIS WITH CONTRAST TECHNIQUE: Multidetector CT imaging of the abdomen and pelvis was performed using the standard protocol following bolus administration of intravenous contrast. CONTRAST:  75mL  OMNIPAQUE IOHEXOL 300 MG/ML  SOLN COMPARISON:  12/31/2002-most recent available comparison FINDINGS: Lower chest: Borderline heart size. No acute finding. Small fatty left Bochdalek's hernia. Hepatobiliary: Small sub capsular cystic density along the right liver.No evidence of biliary obstruction or stone. Pancreas: 6 mm cystic density at the pancreatic head is likely tortuous duct based on reformats, stable from prior. No main duct dilatation or acute finding. Spleen: Unremarkable. Adrenals/Urinary Tract: Negative adrenals. No hydronephrosis  or stone. Bilateral renal cystic densities. Unremarkable bladder. Stomach/Bowel: Prominent submucosal thickness which is likely from underdistention. Of the gastric antrum there is no evidence of adjacent inflammation. No evidence of bowel inflammation, including appendicitis. Vascular/Lymphatic: Atherosclerotic calcification. No acute vascular finding no mass or adenopathy. Reproductive:Benign coarse calcification in the uterus Other: No ascites or pneumoperitoneum. Musculoskeletal: Bilateral hip arthroplasty which causes streak artifact over the pelvis. Lumbar spine degeneration with L4-5 anterolisthesis. IMPRESSION: No acute finding.  No bowel obstruction or appendicitis. Electronically Signed   By: Marnee Spring M.D.   On: 05/09/2018 07:22   US Carotid Bilateral  Result Date: 05/09/2018 CLINICAL DATA:  Hypertension and syncope. EXAM: BILATERAL CAROTID DUPLEX ULTRASOUND TECHNIQUE: Wallace Cullens scale imaging, color Doppler and duplex ultrasound were performed of bilateral carotid and vertebral arteries in the neck. COMPARISON:  None. FINDINGS: Criteria: Quantification of carotid stenosis is based on velocity parameters that correlate the residual internal carotid diameter with NASCET-based stenosis levels, using the diameter of the distal internal carotid lumen as the denominator for stenosis measurement. The following velocity measurements were obtained: RIGHT ICA:  103/34  cm/sec CCA:  46/9 cm/sec SYSTOLIC ICA/CCA RATIO:  2.2 ECA:  44 cm/sec LEFT ICA:  85/25 cm/sec CCA:  46/10 cm/sec SYSTOLIC ICA/CCA RATIO:  1.8 ECA:  61 cm/sec RIGHT CAROTID ARTERY: The common carotid artery demonstrates mild intimal thickening. There is a mild amount of predominately calcified plaque at the level of the carotid bulb. Minimal plaque at the origin of the right ICA. Estimated right ICA stenosis is less than 50%. The right ICA is moderately tortuous. RIGHT VERTEBRAL ARTERY: Antegrade flow with normal waveform and velocity. LEFT CAROTID ARTERY: The common carotid artery demonstrates intimal thickening. No significant focal plaque identified. No evidence of left ICA stenosis. The left ICA is moderately tortuous. LEFT VERTEBRAL ARTERY: Antegrade flow with normal waveform and velocity. IMPRESSION: 1. Mild amount of plaque at the level of the right carotid bulb and ICA origin. Estimated right ICA stenosis is less than 50%. 2. No evidence of left ICA stenosis. 3. Tortuosity of bilateral internal carotid arteries consistent with longstanding hypertension. Electronically Signed   By: Irish Lack M.D.   On: 05/09/2018 10:11   Dg Chest Portable 1 View  Result Date: 05/09/2018 CLINICAL DATA:  Dyspnea EXAM: PORTABLE CHEST 1 VIEW COMPARISON:  12/22/2016 FINDINGS: Cardiomegaly. Aortic and mitral valve replacement. Dual-chamber pacer leads from the left. There is no edema, consolidation, effusion, or pneumothorax. Artifact from EKG leads. IMPRESSION: Stable from prior.  No evidence of acute disease. Electronically Signed   By: Marnee Spring M.D.   On: 05/09/2018 07:04     Procedures   ____________________________________________   INITIAL IMPRESSION / MDM / ASSESSMENT AND PLAN / ED COURSE  As part of my medical decision making, I reviewed the following data within the electronic MEDICAL RECORD NUMBER  "Examined 78 year old female present with above-stated history and physical exam concerning for  possible sepsis and a such protocol was initiated.  Patient hypothermic on arrival heating blanket applied.  Laboratory data notable for a lactic acid of 3.0.  Patient source potentially urinary infection.  Patient given appropriate antibiotic therapy.  Patient discussed with Dr. Domenick Gong for hospital admission further evaluation and management ____________________________________________  FINAL CLINICAL IMPRESSION(S) / ED DIAGNOSES  Sepsis UTI  MEDICATIONS GIVEN DURING THIS VISIT:  Medications  cefTRIAXone (ROCEPHIN) 1 g in sodium chloride 0.9 % 100 mL IVPB (has no administration in time range)  multivitamin with minerals tablet 1 tablet (1 tablet Oral  Given 05/09/18 0916)  0.9 %  sodium chloride infusion ( Intravenous New Bag/Given 05/10/18 0331)  acetaminophen (TYLENOL) tablet 650 mg (has no administration in time range)    Or  acetaminophen (TYLENOL) suppository 650 mg (has no administration in time range)  polyethylene glycol (MIRALAX / GLYCOLAX) packet 17 g (has no administration in time range)  ondansetron (ZOFRAN) tablet 4 mg (has no administration in time range)    Or  ondansetron (ZOFRAN) injection 4 mg (has no administration in time range)  apixaban (ELIQUIS) tablet 2.5 mg (2.5 mg Oral Given 05/09/18 2205)  hydrALAZINE (APRESOLINE) injection 10 mg (has no administration in time range)  iohexol (OMNIPAQUE) 300 MG/ML solution 75 mL (75 mLs Intravenous Contrast Given 05/09/18 0658)  cefTRIAXone (ROCEPHIN) 1 g in sodium chloride 0.9 % 100 mL IVPB (0 g Intravenous Stopped 05/09/18 1610)     ED Discharge Orders    None       Note:  This document was prepared using Dragon voice recognition software and may include unintentional dictation errors.   Darci Current, MD 05/10/18 [phone]

## 2018-05-09 NOTE — Progress Notes (Signed)
PT Cancellation Note  Patient Details Name: [name] MRN: [id number] DOB: 1940-07-10   Cancelled Treatment:    Reason Eval/Treat Not Completed: Other (comment)(PT attempted to initate evaluation, transport entered room to bring patient to ultrasound. PT will follow up as able.)   Olga Coaster PT, DPT 9:33 AM,05/09/18 [phone]

## 2018-05-09 NOTE — ED Notes (Signed)
Date and time results received: 05/09/18 9528  Test: Lactic Acid  Critical Value: 3.0  Name of Provider Notified: Dr. Manson Passey

## 2018-05-09 NOTE — Progress Notes (Signed)
   05/09/18 1500  Clinical Encounter Type  Visited With Patient  Visit Type Initial  Spiritual Encounters  Spiritual Needs Sacred text;Emotional  Chaplain went into explain about the AD. Pt shared her [name] faith with the ch. Pt was raised to be a [address] and takes faith very seriously. Her understanding of God's providence informs the pt's decision about her end-of-life care. Ch left the AD document with her so she could discuss with her sons when she returns home. Pt encouraged the ch of the work that she does and shared a few scripture verses with the ch.

## 2018-05-09 NOTE — Evaluation (Signed)
Physical Therapy Evaluation Patient Details Name: [name] MRN: [id number] DOB: 1940/04/19 Today's Date: 05/09/2018   History of Present Illness  Patient is 78 yo female presented to ED due to not feeling well, nausea, vomiting. PMH of afib, CHF, kidney disease, HTN, pacemaker, SOB.    Clinical Impression  Patient alert, oriented, behavior WFLs and reporting that she feels that she has returned to her baseline function. Stated she lives alone in a one story home with friends/family nearby that can check on her, independent in ADLs/IADLS/gait.   Patient demonstrated bed mobility and transfers mod I, strength WFLs. Ambulated with IV pole and then without device >1105ft mod I/independently. HR monitored and WFLs throughout. The patient demonstrated and reported return to baseline level of functioning, no further acute PT needs indicated. PT to sign off. Please reconsult PT if pt status changes or acute needs are identified.      Follow Up Recommendations No PT follow up    Equipment Recommendations       Recommendations for Other Services       Precautions / Restrictions Precautions Precautions: None Restrictions Weight Bearing Restrictions: No      Mobility  Bed Mobility Overal bed mobility: Modified Independent                Transfers Overall transfer level: Modified independent                  Ambulation/Gait Ambulation/Gait assistance: Independent;Modified independent (Device/Increase time) Gait Distance (Feet): 125 Feet   Gait Pattern/deviations: WFL(Within Functional Limits)   Gait velocity interpretation: >2.62 ft/sec, indicative of community ambulatory    Stairs            Wheelchair Mobility    Modified Rankin (Stroke Patients Only)       Balance Overall balance assessment: Modified Independent                                           Pertinent Vitals/Pain Pain Assessment: No/denies pain    Home  Living Family/patient expects to be discharged to:: Private residence Living Arrangements: Alone Available Help at Discharge: Family;Available PRN/intermittently;Friend(s) Type of Home: House Home Access: Stairs to enter Entrance Stairs-Rails: None Entrance Stairs-Number of Steps: 2 Home Layout: One level Home Equipment: Toilet riser;Cane - single point;Walker - 2 wheels      Prior Function Level of Independence: Independent with assistive device(s)         Comments: Patient [place] SPC [place].      Hand Dominance   Dominant Hand: Right    Extremity/Trunk Assessment   Upper Extremity Assessment Upper Extremity Assessment: Overall WFL for tasks assessed    Lower Extremity Assessment Lower Extremity Assessment: Overall WFL for tasks assessed    Cervical / Trunk Assessment Cervical / Trunk Assessment: Normal  Communication   Communication: No difficulties  Cognition Arousal/Alertness: Awake/alert Behavior During Therapy: WFL for tasks assessed/performed Overall Cognitive Status: Within Functional Limits for tasks assessed                                        General Comments      Exercises     Assessment/Plan    PT Assessment Patent does not need any further  PT services  PT Problem List         PT Treatment Interventions      PT Goals (Current goals can be found in the Care Plan section)       Frequency     Barriers to discharge        Co-evaluation               AM-PAC PT "6 Clicks" Mobility  Outcome Measure Help needed turning from your back to your side while in a flat bed without using bedrails?: None Help needed moving from lying on your back to sitting on the side of a flat bed without using bedrails?: None   Help needed standing up from a chair using your arms (e.g., wheelchair or bedside chair)?: None Help needed to walk in hospital room?: None Help needed climbing  3-5 steps with a railing? : None 6 Click Score: 20    End of Session Equipment Utilized During Treatment: Gait belt Activity Tolerance: Patient tolerated treatment well Patient left: with chair alarm set;in chair;with call bell/phone within reach Nurse Communication: Mobility status      Time: 2035-5974 PT Time Calculation (min) (ACUTE ONLY): 8 min   Charges:   PT Evaluation $PT Eval Low Complexity: 1 Low          Olga Coaster PT, DPT 11:01 AM,05/09/18 [phone]

## 2018-05-09 NOTE — Progress Notes (Signed)
Family Meeting Note  Advance Directive:no  Today a meeting took place with the Patient. The following clinical team members were present during this meeting:MD  The following were discussed:Patient's diagnosis: hypothermia Acute cystitis 3 of pacemaker placed  , Patient's progosis: > 12 months and Goals for treatment: DNR  Additional follow-up to be provided: chaplain to create AD  Time spent during discussion:16 minutes  Adrian Saran, MD

## 2018-05-09 NOTE — Plan of Care (Signed)

## 2018-05-09 NOTE — ED Notes (Signed)
Dr. Manson Passey made aware of lactic acid and he placed orders for chest x-ray and CT scan.

## 2018-05-09 NOTE — Consult Note (Signed)
ANTICOAGULATION CONSULT NOTE - Initial Consult  Pharmacy Consult for Eliquis Indication: atrial fibrillation  No Known Allergies  Vital Signs: Temp: 97.7 F (36.5 C) (03/19 0910) Temp Source: Oral (03/19 0910) BP: 107/74 (03/19 0910) Pulse Rate: 58 (03/19 0910)  Labs: Recent Labs    05/09/18 0537 05/09/18 0607  HGB 13.9  --   HCT 44.3  --   PLT 97*  --   CREATININE  --  1.05*    CrCl cannot be calculated (Unknown ideal weight.).   Medical History: Past Medical History:  Diagnosis Date  . Aortic stenosis    2013  . Arthritis    "hips" (12/21/2016)  . Atrial fibrillation [facility])    Status post cardioversion November 2013  . Chronic diastolic CHF (congestive heart failure) (HCC)   . Chronic kidney disease   . Complication of anesthesia    after hip surgery 06-2016 bp and heart rate dropped pt. has had a pacemaker placed since this event.  . History of blood transfusion 05/2016   "when I had my hip replaced" (12/21/2016)  . Hypertension   . Presence of permanent cardiac pacemaker    medtronic  . Shortness of breath    hx dyspnea and respiratory abnormalities    Medications:  Patient dose of Eliquis prior to admission 2.5mg  bid  Assessment: Pharmacy has been consulted for resuming pta Eliquis for A Fib  Goal of Therapy:  Monitor platelets by anticoagulation protocol: Yes   Plan:  Will continue Eliquis 2.5mg  (home dose) while awaiting weight to be updated and reconsider appropriateness of dose after weight is updated.  Patient has baseline Scr and CBC - will assess at least every 3 days per protocol.  Albina Billet, PharmD, BCPS Clinical Pharmacist 05/09/2018 9:15 AM

## 2018-05-09 NOTE — ED Triage Notes (Signed)
Patient coming in [facility] for feeling sick, she lives in [place] but has been staying with son since Monday. Patient had a pacemaker placed in November 2018. She was given 4mg  of zofran because patient was dry heaving.

## 2018-05-09 NOTE — Care Management Obs Status (Signed)
MEDICARE OBSERVATION STATUS NOTIFICATION   Patient Details  Name: [name] MRN: [id number] Date of Birth: 01/09/1941   Medicare Observation Status Notification Given:  Yes    Corey Harold 05/09/2018, 3:50 PM

## 2018-05-09 NOTE — ED Notes (Signed)
Patient states she just doesn't feel well, patient stated she vomited at home and had an episode of diarrhea in her pants. Patient was cleaned up by staff. Stool was lose but formed.

## 2018-05-09 NOTE — ED Notes (Signed)
Patient in CT

## 2018-05-09 NOTE — ED Notes (Signed)
Bear hugger placed on patient on medium heat because of patient had temp of 96.2 rectal.

## 2018-05-09 NOTE — ED Notes (Signed)
ED TO INPATIENT HANDOFF REPORT  ED Nurse Name and Phone #: [place] [phone]  S Name/Age/Gender [name] 78 y.o. female Room/Bed: ED02A/ED02A  Code Status   Code Status: Full Code  Home/SNF/Other Home Patient oriented to: self, place, time and situation Is this baseline? Yes   Triage Complete: Triage complete  Chief Complaint [facility] - Nausea  Triage Note Patient coming in [facility] for feeling sick, she lives in [place] but has been staying with son since Monday. Patient had a pacemaker placed in November 2018. She was given  of zofran because patient was dry heaving.    Allergies No Known Allergies  Level of Care/Admitting Diagnosis ED Disposition    ED Disposition Condition Comment   Admit  Hospital Area: [facility] [[id number]]  Level of Care: Med-Surg [16]  Diagnosis: Acute cystitis [595.0.ICD-9-CM]  Admitting Physician: Adrian Saran [[id number]]  Attending Physician: MODY, Patricia Pesa [[id number]]  PT Class (Do Not Modify): Observation [104]  PT Acc Code (Do Not Modify): Observation [10022]       B Medical/Surgery History Past Medical History:  Diagnosis Date  . Aortic stenosis    2013  . Arthritis    "hips" (12/21/2016)  . Atrial fibrillation [facility])    Status post cardioversion November 2013  . Chronic diastolic CHF (congestive heart failure) (HCC)   . Chronic kidney disease   . Complication of anesthesia    after hip surgery 06-2016 bp and heart rate dropped pt. has had a pacemaker placed since this event.  . History of blood transfusion 05/2016   "when I had my hip replaced" (12/21/2016)  . Hypertension   . Presence of permanent cardiac pacemaker    medtronic  . Shortness of breath    hx dyspnea and respiratory abnormalities   Past Surgical History:  Procedure Laterality Date  . AORTIC VALVE REPLACEMENT  10/12/2011   Procedure: AORTIC VALVE REPLACEMENT (AVR);  Surgeon: Alleen Borne, MD;  Location: [facility] OR;  Service: Open Heart  Surgery;  Laterality: N/A;  . CARDIAC CATHETERIZATION     "I've had several; no OR" (12/21/2016)  . CARDIAC VALVE REPLACEMENT    . CARDIOVERSION  12/26/2011   Procedure: CARDIOVERSION;  Surgeon: Pamella Pert, MD;  Location: [facility] ENDOSCOPY;  Service: Cardiovascular;  Laterality: N/A;  . CORONARY ANGIOGRAPHY N/A 09/26/2016   Procedure: CORONARY ANGIOGRAPHY;  Surgeon: Elder Negus, MD;  Location: [facility] INVASIVE CV LAB;  Service: Cardiovascular;  Laterality: N/A;  . EXPLORATION POST OPERATIVE OPEN HEART  10/12/2011   Procedure: EXPLORATION POST OPERATIVE OPEN HEART;  Surgeon: Alleen Borne, MD;  Location: [facility] OR;  Service: Open Heart Surgery;  Laterality: N/A;  . INSERT / REPLACE / REMOVE PACEMAKER  12/21/2016  . JOINT REPLACEMENT     right hip 06-2016 Left hip 02-08-17 Dr. Linna Caprice  . LEFT AND RIGHT HEART CATHETERIZATION WITH CORONARY ANGIOGRAM N/A 09/11/2011   Procedure: LEFT AND RIGHT HEART CATHETERIZATION WITH CORONARY ANGIOGRAM;  Surgeon: Pamella Pert, MD;  Location: [facility] CATH LAB;  Service: Cardiovascular;  Laterality: N/A;  . MITRAL VALVE REPAIR  10/12/2011   Procedure: MITRAL VALVE REPAIR (MVR);  Surgeon: Alleen Borne, MD;  Location: [facility] OR;  Service: Open Heart Surgery;  Laterality: N/A;  mitral valve annuloplasty  . PACEMAKER IMPLANT N/A 12/21/2016   Procedure: PACEMAKER IMPLANT;  Surgeon: Marinus Maw, MD;  Location: [facility] INVASIVE CV LAB;  Service: Cardiovascular;  Laterality: N/A;  . RIGHT HEART CATH N/A 09/26/2016   Procedure: RIGHT  HEART CATH;  Surgeon: Elder Negus, MD;  Location: [facility] INVASIVE CV LAB;  Service: Cardiovascular;  Laterality: N/A;  . TEE WITHOUT CARDIOVERSION N/A 08/22/2016   Procedure: TRANSESOPHAGEAL ECHOCARDIOGRAM (TEE);  Surgeon: Yates Decamp, MD;  Location: [facility] ENDOSCOPY;  Service: Cardiovascular;  Laterality: N/A;  . TEE WITHOUT CARDIOVERSION N/A 12/12/2016   Procedure: TRANSESOPHAGEAL ECHOCARDIOGRAM (TEE);  Surgeon: Lars Masson, MD;  Location: [facility]  ENDOSCOPY;  Service: Cardiovascular;  Laterality: N/A;  . TOTAL HIP ARTHROPLASTY Right 06/15/2016   Procedure: RIGHT TOTAL HIP ARTHROPLASTY ANTERIOR APPROACH;  Surgeon: Samson Frederic, MD;  Location: [facility] ORS;  Service: Orthopedics;  Laterality: Right;  Dr. needs RNFA  . TOTAL HIP ARTHROPLASTY Left 02/08/2017   Procedure: LEFT TOTAL HIP ARTHROPLASTY ANTERIOR APPROACH;  Surgeon: Samson Frederic, MD;  Location: [facility] ORS;  Service: Orthopedics;  Laterality: Left;  Needs RNFA     A IV Location/Drains/Wounds Patient Lines/Drains/Airways Status   Active Line/Drains/Airways    Name:   Placement date:   Placement time:   Site:   Days:   Peripheral IV 05/09/18 Left Hand   05/09/18    0539    Hand   less than 1   Incision 10/12/11 Sternum Mid   10/12/11    2023     2401   Incision (Closed) 06/15/16 Hip Right   06/15/16    1716     693   Incision (Closed) 12/21/16 Chest Left   12/21/16    1900     504   Incision (Closed) 02/08/17 Hip Left   02/08/17    0828     455          Intake/Output Last 24 hours No intake or output data in the 24 hours ending 05/09/18 0800  Labs/Imaging Results for orders placed or performed during the hospital encounter of 05/09/18 (from the past 48 hour(s))  CBC with Differential     Status: Abnormal   Collection Time: 05/09/18  5:37 AM  Result Value Ref Range   WBC 5.1 4.0 - 10.5 K/uL   RBC 4.41 3.87 - 5.11 MIL/uL   Hemoglobin 13.9 12.0 - 15.0 g/dL   HCT 96.2 22.9 - 79.8 %   MCV 100.5 (H) 80.0 - 100.0 fL   MCH 31.5 26.0 - 34.0 pg   MCHC 31.4 30.0 - 36.0 g/dL   RDW 92.1 19.4 - 17.4 %   Platelets 97 (L) 150 - 400 K/uL    Comment: SPECIMEN CHECKED FOR CLOTS Immature Platelet Fraction may be clinically indicated, consider ordering this additional test [id number]    nRBC 0.0 0.0 - 0.2 %   Neutrophils Relative % 59 %   Neutro Abs 3.0 1.7 - 7.7 K/uL   Lymphocytes Relative 32 %   Lymphs Abs 1.6 0.7 - 4.0 K/uL   Monocytes Relative 8 %   Monocytes Absolute 0.4 0.1 -  1.0 K/uL   Eosinophils Relative 1 %   Eosinophils Absolute 0.1 0.0 - 0.5 K/uL   Basophils Relative 0 %   Basophils Absolute 0.0 0.0 - 0.1 K/uL   Immature Granulocytes 0 %   Abs Immature Granulocytes 0.01 0.00 - 0.07 K/uL    Comment: Performed at [facility], [address] [place], [place] [address]  Gastrointestinal Panel by PCR , Stool     Status: None   Collection Time: 05/09/18  5:37 AM  Result Value Ref Range   Campylobacter species NOT DETECTED NOT DETECTED   Plesimonas shigelloides NOT DETECTED NOT DETECTED  Salmonella species NOT DETECTED NOT DETECTED   Yersinia enterocolitica NOT DETECTED NOT DETECTED   Vibrio species NOT DETECTED NOT DETECTED   Vibrio cholerae NOT DETECTED NOT DETECTED   Enteroaggregative E coli (EAEC) NOT DETECTED NOT DETECTED   Enteropathogenic E coli (EPEC) NOT DETECTED NOT DETECTED   Enterotoxigenic E coli (ETEC) NOT DETECTED NOT DETECTED   Shiga like toxin producing E coli (STEC) NOT DETECTED NOT DETECTED   Shigella/Enteroinvasive E coli (EIEC) NOT DETECTED NOT DETECTED   Cryptosporidium NOT DETECTED NOT DETECTED   Cyclospora cayetanensis NOT DETECTED NOT DETECTED   Entamoeba histolytica NOT DETECTED NOT DETECTED   Giardia lamblia NOT DETECTED NOT DETECTED   Adenovirus F40/41 NOT DETECTED NOT DETECTED   Astrovirus NOT DETECTED NOT DETECTED   Norovirus GI/GII NOT DETECTED NOT DETECTED   Rotavirus A NOT DETECTED NOT DETECTED   Sapovirus (I, II, IV, and V) NOT DETECTED NOT DETECTED    Comment: Performed at [facility], [address] [place], [place] [address]  C difficile quick scan w PCR reflex     Status: None   Collection Time: 05/09/18  5:37 AM  Result Value Ref Range   C Diff antigen NEGATIVE NEGATIVE   C Diff toxin NEGATIVE NEGATIVE   C Diff interpretation No C. difficile detected.     Comment: Performed at [facility], [address] [place], [place] [address]  Urinalysis, Complete w Microscopic      Status: Abnormal   Collection Time: 05/09/18  5:37 AM  Result Value Ref Range   Color, Urine YELLOW (A) YELLOW   APPearance CLOUDY (A) CLEAR   Specific Gravity, Urine 1.017 1.005 - 1.030   pH 5.0 5.0 - 8.0   Glucose, UA NEGATIVE NEGATIVE mg/dL   Hgb urine dipstick NEGATIVE NEGATIVE   Bilirubin Urine NEGATIVE NEGATIVE   Ketones, ur NEGATIVE NEGATIVE mg/dL   Protein, ur 30 (A) NEGATIVE mg/dL   Nitrite NEGATIVE NEGATIVE   Leukocytes,Ua MODERATE (A) NEGATIVE   RBC / HPF 21-50 0 - 5 RBC/hpf   WBC, UA 11-20 0 - 5 WBC/hpf   Bacteria, UA RARE (A) NONE SEEN   Squamous Epithelial / LPF 0-5 0 - 5    Comment: Performed at [facility], [address] [place], [place] [address]  Lactic acid, plasma     Status: Abnormal   Collection Time: 05/09/18  5:37 AM  Result Value Ref Range   Lactic Acid, Venous 3.0 (HH) 0.5 - 1.9 mmol/L    Comment: CRITICAL RESULT CALLED TO, READ BACK BY AND VERIFIED WITH  REBECCA UHORCHUK AT [id number] 05/09/18 SDR Performed at [facility], [address] [place], [place] [address]   Lipase, blood     Status: None   Collection Time: 05/09/18  6:07 AM  Result Value Ref Range   Lipase 41 11 - 51 U/L    Comment: Performed at [facility], [address] [place], [place] [address]  Comprehensive metabolic panel     Status: Abnormal   Collection Time: 05/09/18  6:07 AM  Result Value Ref Range   Sodium 138 135 - 145 mmol/L   Potassium 4.4 3.5 - 5.1 mmol/L   Chloride 108 98 - 111 mmol/L   CO2 24 22 - 32 mmol/L   Glucose, Bld 136 (H) 70 - 99 mg/dL   BUN 25 (H) 8 - 23 mg/dL   Creatinine, Ser 6.571.05 (H) 0.44 - 1.00 mg/dL   Calcium 9.3 8.9 - 84.610.3 mg/dL  Total Protein 7.2 6.5 - 8.1 g/dL   Albumin 3.9 3.5 - 5.0 g/dL   AST 18 15 - 41 U/L   ALT 13 0 - 44 U/L   Alkaline Phosphatase 46 38 - 126 U/L   Total Bilirubin 0.5 0.3 - 1.2 mg/dL   GFR calc non Af Amer 51 (L) >60 mL/min   GFR calc Af Amer 59 (L) >60 mL/min   Anion gap 6 5 - 15    Comment:  Performed at [facility], [address] [place], [place] [address]   Ct Abdomen Pelvis W Contrast  Result Date: 05/09/2018 CLINICAL DATA:  Generalized unwell feeling. Episode of vomiting and diarrhea. EXAM: CT ABDOMEN AND PELVIS WITH CONTRAST TECHNIQUE: Multidetector CT imaging of the abdomen and pelvis was performed using the standard protocol following bolus administration of intravenous contrast. CONTRAST:  75mL OMNIPAQUE IOHEXOL 300 MG/ML  SOLN COMPARISON:  12/31/2002-most recent available comparison FINDINGS: Lower chest: Borderline heart size. No acute finding. Small fatty left Bochdalek's hernia. Hepatobiliary: Small sub capsular cystic density along the right liver.No evidence of biliary obstruction or stone. Pancreas: 6 mm cystic density at the pancreatic head is likely tortuous duct based on reformats, stable from prior. No main duct dilatation or acute finding. Spleen: Unremarkable. Adrenals/Urinary Tract: Negative adrenals. No hydronephrosis or stone. Bilateral renal cystic densities. Unremarkable bladder. Stomach/Bowel: Prominent submucosal thickness which is likely from underdistention. Of the gastric antrum there is no evidence of adjacent inflammation. No evidence of bowel inflammation, including appendicitis. Vascular/Lymphatic: Atherosclerotic calcification. No acute vascular finding no mass or adenopathy. Reproductive:Benign coarse calcification in the uterus Other: No ascites or pneumoperitoneum. Musculoskeletal: Bilateral hip arthroplasty which causes streak artifact over the pelvis. Lumbar spine degeneration with L4-5 anterolisthesis. IMPRESSION: No acute finding.  No bowel obstruction or appendicitis. Electronically Signed   By: Marnee Spring M.D.   On: 05/09/2018 07:22   Dg Chest Portable 1 View  Result Date: 05/09/2018 CLINICAL DATA:  Dyspnea EXAM: PORTABLE CHEST 1 VIEW COMPARISON:  12/22/2016 FINDINGS: Cardiomegaly. Aortic and mitral valve replacement. Dual-chamber  pacer leads from the left. There is no edema, consolidation, effusion, or pneumothorax. Artifact from EKG leads. IMPRESSION: Stable from prior.  No evidence of acute disease. Electronically Signed   By: Marnee Spring M.D.   On: 05/09/2018 07:04    Pending Labs Unresulted Labs (From admission, onward)    Start     Ordered   05/16/18 0500  Creatinine, serum  (enoxaparin (LOVENOX)    CrCl >/= 30 ml/min)  Weekly,   STAT    Comments:  while on enoxaparin therapy    05/09/18 0750   05/10/18 0500  Basic metabolic panel  Tomorrow morning,   STAT     05/09/18 0750   05/09/18 0751  Lactic acid, plasma  STAT Now then every 3 hours,   STAT     05/09/18 0750   05/09/18 0750  CBC  (enoxaparin (LOVENOX)    CrCl >/= 30 ml/min)  Once,   STAT    Comments:  Baseline for enoxaparin therapy IF NOT ALREADY DRAWN.  Notify MD if PLT < 100 K.    05/09/18 0750   05/09/18 0750  Creatinine, serum  (enoxaparin (LOVENOX)    CrCl >/= 30 ml/min)  Once,   STAT    Comments:  Baseline for enoxaparin therapy IF NOT ALREADY DRAWN.    05/09/18 0750   05/09/18 0748  Urine culture  ONCE - STAT,   STAT     05/09/18  0272   05/09/18 0550  Blood culture (routine x 2)  BLOOD CULTURE X 2,   STAT     05/09/18 0549          Vitals/Pain Today's Vitals   05/09/18 0538 05/09/18 0709 05/09/18 0745 05/09/18 0758  BP:    107/76  Pulse:  71 60   Resp:  18 17   Temp: (!) 96.2 F (35.7 C)   97.7 F (36.5 C)  TempSrc: Rectal   Oral  SpO2:  100% 98%   PainSc:        Isolation Precautions Enteric precautions (UV disinfection)  Medications Medications  cefTRIAXone (ROCEPHIN) 1 g in sodium chloride 0.9 % 100 mL IVPB (1 g Intravenous New Bag/Given 05/09/18 0759)  cefTRIAXone (ROCEPHIN) 1 g in sodium chloride 0.9 % 100 mL IVPB (has no administration in time range)  amLODipine (NORVASC) tablet 10 mg (has no administration in time range)  multivitamin with minerals tablet 1 tablet (has no administration in time range)   enoxaparin (LOVENOX) injection 40 mg (has no administration in time range)  0.9 %  sodium chloride infusion (has no administration in time range)  acetaminophen (TYLENOL) tablet 650 mg (has no administration in time range)    Or  acetaminophen (TYLENOL) suppository 650 mg (has no administration in time range)  polyethylene glycol (MIRALAX / GLYCOLAX) packet 17 g (has no administration in time range)  ondansetron (ZOFRAN) tablet 4 mg (has no administration in time range)    Or  ondansetron (ZOFRAN) injection 4 mg (has no administration in time range)  iohexol (OMNIPAQUE) 300 MG/ML solution 75 mL (75 mLs Intravenous Contrast Given 05/09/18 0658)    Mobility walks Moderate fall risk   Focused Assessments    R Recommendations: See Admitting Provider Note  Report given to:

## 2018-05-09 NOTE — H&P (Signed)
[facility] [place] [facility]   PATIENT NAME: [name]    MR#:  [id number]  DATE OF BIRTH:  1941/01/20  DATE OF ADMISSION:  05/09/2018  PRIMARY CARE PHYSICIAN: Ileana Ladd, MD   REQUESTING/REFERRING PHYSICIAN: Dr. Manson Passey  CHIEF COMPLAINT:   Feels sick HISTORY OF PRESENT ILLNESS:  [name]  is a 78 y.o. female with a known history of pacemaker, PAF, chronic diastolic heart failure, chronic kidney disease stage II who presented to the emergency room via EMS after her son found her in her bed soiled. Patient reports that she has been in her usual state of health.  She is visiting her son in [place].  She lives in [place].  She woke up in the middle of the night and was sweating and the next thing she knew she lost control of her bowels.  She denies chest pain, shortness of breath, fever, chills.  She denies urinary symptoms.  She does states she had a pacemaker placed in 2018.  When EMS arrived she was found to be hypothermic.  She was placed on Bair hugger when she arrived in the ER.  Urine analysis shows UTI.  Chest x-ray shows no acute infection.  CT the abdomen was unremarkable as well.  Patient now reports that she is doing well and has no nausea, vomiting.  ED MD was concerned due to elevated lactic acid level.  PAST MEDICAL HISTORY:   Past Medical History:  Diagnosis Date  . Aortic stenosis    2013  . Arthritis    "hips" (12/21/2016)  . Atrial fibrillation [facility])    Status post cardioversion November 2013  . Chronic diastolic CHF (congestive heart failure) (HCC)   . Chronic kidney disease   . Complication of anesthesia    after hip surgery 06-2016 bp and heart rate dropped pt. has had a pacemaker placed since this event.  . History of blood transfusion 05/2016   "when I had my hip replaced" (12/21/2016)  . Hypertension   . Presence of permanent cardiac pacemaker    medtronic  . Shortness of breath    hx dyspnea and respiratory  abnormalities    PAST SURGICAL HISTORY:   Past Surgical History:  Procedure Laterality Date  . AORTIC VALVE REPLACEMENT  10/12/2011   Procedure: AORTIC VALVE REPLACEMENT (AVR);  Surgeon: Alleen Borne, MD;  Location: [facility] OR;  Service: Open Heart Surgery;  Laterality: N/A;  . CARDIAC CATHETERIZATION     "I've had several; no OR" (12/21/2016)  . CARDIAC VALVE REPLACEMENT    . CARDIOVERSION  12/26/2011   Procedure: CARDIOVERSION;  Surgeon: Pamella Pert, MD;  Location: [facility] ENDOSCOPY;  Service: Cardiovascular;  Laterality: N/A;  . CORONARY ANGIOGRAPHY N/A 09/26/2016   Procedure: CORONARY ANGIOGRAPHY;  Surgeon: Elder Negus, MD;  Location: [facility] INVASIVE CV LAB;  Service: Cardiovascular;  Laterality: N/A;  . EXPLORATION POST OPERATIVE OPEN HEART  10/12/2011   Procedure: EXPLORATION POST OPERATIVE OPEN HEART;  Surgeon: Alleen Borne, MD;  Location: [facility] OR;  Service: Open Heart Surgery;  Laterality: N/A;  . INSERT / REPLACE / REMOVE PACEMAKER  12/21/2016  . JOINT REPLACEMENT     right hip 06-2016 Left hip 02-08-17 Dr. Linna Caprice  . LEFT AND RIGHT HEART CATHETERIZATION WITH CORONARY ANGIOGRAM N/A 09/11/2011   Procedure: LEFT AND RIGHT HEART CATHETERIZATION WITH CORONARY ANGIOGRAM;  Surgeon: Pamella Pert, MD;  Location: [facility] CATH LAB;  Service: Cardiovascular;  Laterality: N/A;  . MITRAL VALVE REPAIR  10/12/2011   Procedure: MITRAL VALVE REPAIR (MVR);  Surgeon: Alleen Borne, MD;  Location: [facility] OR;  Service: Open Heart Surgery;  Laterality: N/A;  mitral valve annuloplasty  . PACEMAKER IMPLANT N/A 12/21/2016   Procedure: PACEMAKER IMPLANT;  Surgeon: Marinus Maw, MD;  Location: [facility] INVASIVE CV LAB;  Service: Cardiovascular;  Laterality: N/A;  . RIGHT HEART CATH N/A 09/26/2016   Procedure: RIGHT HEART CATH;  Surgeon: Elder Negus, MD;  Location: [facility] INVASIVE CV LAB;  Service: Cardiovascular;  Laterality: N/A;  . TEE WITHOUT CARDIOVERSION N/A 08/22/2016   Procedure: TRANSESOPHAGEAL  ECHOCARDIOGRAM (TEE);  Surgeon: Yates Decamp, MD;  Location: [facility] ENDOSCOPY;  Service: Cardiovascular;  Laterality: N/A;  . TEE WITHOUT CARDIOVERSION N/A 12/12/2016   Procedure: TRANSESOPHAGEAL ECHOCARDIOGRAM (TEE);  Surgeon: Lars Masson, MD;  Location: [facility] ENDOSCOPY;  Service: Cardiovascular;  Laterality: N/A;  . TOTAL HIP ARTHROPLASTY Right 06/15/2016   Procedure: RIGHT TOTAL HIP ARTHROPLASTY ANTERIOR APPROACH;  Surgeon: Samson Frederic, MD;  Location: [facility] ORS;  Service: Orthopedics;  Laterality: Right;  Dr. needs RNFA  . TOTAL HIP ARTHROPLASTY Left 02/08/2017   Procedure: LEFT TOTAL HIP ARTHROPLASTY ANTERIOR APPROACH;  Surgeon: Samson Frederic, MD;  Location: [facility] ORS;  Service: Orthopedics;  Laterality: Left;  Needs RNFA    SOCIAL HISTORY:   Social History   Tobacco Use  . Smoking status: Never Smoker  . Smokeless tobacco: Never Used  Substance Use Topics  . Alcohol use: No    FAMILY HISTORY:   Family History  Problem Relation Age of Onset  . Hypertension Mother     DRUG ALLERGIES:  No Known Allergies  REVIEW OF SYSTEMS:   Review of Systems  Constitutional: Positive for malaise/fatigue. Negative for chills and fever.  HENT: Negative.  Negative for ear discharge, ear pain, hearing loss, nosebleeds and sore throat.   Eyes: Negative.  Negative for blurred vision and pain.  Respiratory: Negative.  Negative for cough, hemoptysis, shortness of breath and wheezing.   Cardiovascular: Negative.  Negative for chest pain, palpitations and leg swelling.  Gastrointestinal: Negative.  Negative for abdominal pain, blood in stool, diarrhea, nausea and vomiting.  Genitourinary: Negative.  Negative for dysuria.  Musculoskeletal: Negative.  Negative for back pain.  Skin: Negative.   Neurological: Negative for dizziness, tremors, speech change, focal weakness, seizures and headaches.  Endo/Heme/Allergies: Negative.  Does not bruise/bleed easily.  Psychiatric/Behavioral: Negative.  Negative  for depression, hallucinations and suicidal ideas.    MEDICATIONS AT HOME:   Prior to Admission medications   Medication Sig Start Date End Date Taking? Authorizing Provider  alendronate (FOSAMAX) 70 MG tablet Take 70 mg by mouth once a week. 04/22/18  Yes [provider]  ELIQUIS 2.5 MG TABS tablet Take 2.5 mg by mouth 2 (two) times daily. 04/13/18  Yes [provider]  metoprolol succinate (TOPROL-XL) 100 MG 24 hr tablet Take 100 mg by mouth daily. 03/08/18  Yes [provider]  Multiple Vitamin (MULTIVITAMIN WITH MINERALS) TABS tablet Take 1 tablet by mouth daily.   Yes [provider]  spironolactone (ALDACTONE) 25 MG tablet TAKE 1 TABLET BY MOUTH  EVERY MORNING 04/02/18  Yes Earl Many, MD      VITAL SIGNS:  Blood pressure 107/74, pulse (!) 58, temperature 97.7 F (36.5 C), temperature source Oral, resp. rate 17, height 5' 4.5" (1.638 m), weight 59.9 kg, SpO2 95 %.  PHYSICAL EXAMINATION:   Physical Exam Constitutional:      General: She is not in acute  distress. HENT:     Head: Normocephalic.     Mouth/Throat:     Pharynx: Oropharynx is clear.  Eyes:     General: No scleral icterus.    Extraocular Movements: Extraocular movements intact.     Conjunctiva/sclera: Conjunctivae normal.  Neck:     Musculoskeletal: Normal range of motion and neck supple.     Vascular: No JVD.     Trachea: No tracheal deviation.  Cardiovascular:     Rate and Rhythm: Normal rate. Rhythm irregular.     Heart sounds: Normal heart sounds. No murmur. No friction rub. No gallop.   Pulmonary:     Effort: Pulmonary effort is normal. No respiratory distress.     Breath sounds: Normal breath sounds. No wheezing or rales.  Chest:     Chest wall: No tenderness.  Abdominal:     General: Bowel sounds are normal. There is no distension.     Palpations: Abdomen is soft. There is no mass.     Tenderness: There is no abdominal tenderness. There is no guarding or rebound.   Musculoskeletal: Normal range of motion.        General: No swelling.  Skin:    General: Skin is warm.     Coloration: Skin is not jaundiced.     Findings: No erythema or rash.  Neurological:     General: No focal deficit present.     Mental Status: She is alert and oriented to person, place, and time. Mental status is at baseline.     Sensory: No sensory deficit.  Psychiatric:        Mood and Affect: Mood normal.        Behavior: Behavior normal.        Thought Content: Thought content normal.        Judgment: Judgment normal.       LABORATORY PANEL:   CBC Recent Labs  Lab 05/09/18 0537  WBC 5.1  HGB 13.9  HCT 44.3  PLT 97*   ------------------------------------------------------------------------------------------------------------------  Chemistries  Recent Labs  Lab 05/09/18 0607  NA 138  K 4.4  CL 108  CO2 24  GLUCOSE 136*  BUN 25*  CREATININE 1.05*  CALCIUM 9.3  AST 18  ALT 13  ALKPHOS 46  BILITOT 0.5   ------------------------------------------------------------------------------------------------------------------  Cardiac Enzymes No results for input(s): TROPONINI in the last 168 hours. ------------------------------------------------------------------------------------------------------------------  RADIOLOGY:  Ct Abdomen Pelvis W Contrast  Result Date: 05/09/2018 CLINICAL DATA:  Generalized unwell feeling. Episode of vomiting and diarrhea. EXAM: CT ABDOMEN AND PELVIS WITH CONTRAST TECHNIQUE: Multidetector CT imaging of the abdomen and pelvis was performed using the standard protocol following bolus administration of intravenous contrast. CONTRAST:  70mL OMNIPAQUE IOHEXOL 300 MG/ML  SOLN COMPARISON:  12/31/2002-most recent available comparison FINDINGS: Lower chest: Borderline heart size. No acute finding. Small fatty left Bochdalek's hernia. Hepatobiliary: Small sub capsular cystic density along the right liver.No evidence of biliary obstruction  or stone. Pancreas: 6 mm cystic density at the pancreatic head is likely tortuous duct based on reformats, stable from prior. No main duct dilatation or acute finding. Spleen: Unremarkable. Adrenals/Urinary Tract: Negative adrenals. No hydronephrosis or stone. Bilateral renal cystic densities. Unremarkable bladder. Stomach/Bowel: Prominent submucosal thickness which is likely from underdistention. Of the gastric antrum there is no evidence of adjacent inflammation. No evidence of bowel inflammation, including appendicitis. Vascular/Lymphatic: Atherosclerotic calcification. No acute vascular finding no mass or adenopathy. Reproductive:Benign coarse calcification in the uterus Other: No ascites or pneumoperitoneum. Musculoskeletal: Bilateral hip  arthroplasty which causes streak artifact over the pelvis. Lumbar spine degeneration with L4-5 anterolisthesis. IMPRESSION: No acute finding.  No bowel obstruction or appendicitis. Electronically Signed   By: Marnee Spring M.D.   On: 05/09/2018 07:22   US Carotid Bilateral  Result Date: 05/09/2018 CLINICAL DATA:  Hypertension and syncope. EXAM: BILATERAL CAROTID DUPLEX ULTRASOUND TECHNIQUE: Wallace Cullens scale imaging, color Doppler and duplex ultrasound were performed of bilateral carotid and vertebral arteries in the neck. COMPARISON:  None. FINDINGS: Criteria: Quantification of carotid stenosis is based on velocity parameters that correlate the residual internal carotid diameter with NASCET-based stenosis levels, using the diameter of the distal internal carotid lumen as the denominator for stenosis measurement. The following velocity measurements were obtained: RIGHT ICA:  103/34 cm/sec CCA:  46/9 cm/sec SYSTOLIC ICA/CCA RATIO:  2.2 ECA:  44 cm/sec LEFT ICA:  85/25 cm/sec CCA:  46/10 cm/sec SYSTOLIC ICA/CCA RATIO:  1.8 ECA:  61 cm/sec RIGHT CAROTID ARTERY: The common carotid artery demonstrates mild intimal thickening. There is a mild amount of predominately calcified plaque  at the level of the carotid bulb. Minimal plaque at the origin of the right ICA. Estimated right ICA stenosis is less than 50%. The right ICA is moderately tortuous. RIGHT VERTEBRAL ARTERY: Antegrade flow with normal waveform and velocity. LEFT CAROTID ARTERY: The common carotid artery demonstrates intimal thickening. No significant focal plaque identified. No evidence of left ICA stenosis. The left ICA is moderately tortuous. LEFT VERTEBRAL ARTERY: Antegrade flow with normal waveform and velocity. IMPRESSION: 1. Mild amount of plaque at the level of the right carotid bulb and ICA origin. Estimated right ICA stenosis is less than 50%. 2. No evidence of left ICA stenosis. 3. Tortuosity of bilateral internal carotid arteries consistent with longstanding hypertension. Electronically Signed   By: Irish Lack M.D.   On: 05/09/2018 10:11   Dg Chest Portable 1 View  Result Date: 05/09/2018 CLINICAL DATA:  Dyspnea EXAM: PORTABLE CHEST 1 VIEW COMPARISON:  12/22/2016 FINDINGS: Cardiomegaly. Aortic and mitral valve replacement. Dual-chamber pacer leads from the left. There is no edema, consolidation, effusion, or pneumothorax. Artifact from EKG leads. IMPRESSION: Stable from prior.  No evidence of acute disease. Electronically Signed   By: Marnee Spring M.D.   On: 05/09/2018 07:04    EKG:  Paced rhythm  IMPRESSION AND PLAN:   78 year old female with history of chronic kidney disease stage II, pacemaker placement and chronic diastolic heart failure who presented via EMS after her son found her soiled.   1.  Hypothermia with elevated lactic acid level but not criteria for sepsis: Hypothermia has resolved and she is off bear hugger Follow-up on blood cultures Treat UTI No other clear etiology at this time.   2.  Acute cystitis: Patient will be treated with Rocephin and follow-up on urine culture  3.  Nausea and dizziness: This is now resolved and happened last night while she was in bed.  Carotid  Doppler was ordered which essentially was unremarkable. Continue telemetry monitoring  4.  Chronic kidney disease stage II: Creatinine is at baseline  5.  History of pacemaker: Patient is followed in [place] by her cardiologist  6.  PAF: Continue Eliquis Blood pressure low/normal and therefore metoprolol be on hold for now   All the records are reviewed and case discussed with ED provider. Management plans discussed with the patient and she is in agreement  CODE STATUS: dnr  TOTAL TIME TAKING CARE OF THIS PATIENT: 44 minutes.    Elex Mainwaring M.D  on 05/09/2018 at 12:12 PM  Between 7am to 6pm - Pager - [phone]  After 6pm go to [email or web address] - password [email or web address]  [facility]  Office  [phone]  CC: Primary care physician; Ileana Ladd, MD

## 2018-05-10 DIAGNOSIS — N3000 Acute cystitis without hematuria: Secondary | ICD-10-CM | POA: Diagnosis not present

## 2018-05-10 LAB — BASIC METABOLIC PANEL
Anion gap: 4 — ABNORMAL LOW (ref 5–15)
BUN: 25 mg/dL — ABNORMAL HIGH (ref 8–23)
CO2: 24 mmol/L (ref 22–32)
Calcium: 9 mg/dL (ref 8.9–10.3)
Chloride: 113 mmol/L — ABNORMAL HIGH (ref 98–111)
Creatinine, Ser: 0.99 mg/dL (ref 0.44–1.00)
GFR calc Af Amer: >60 mL/min (ref >60)
GFR calc non Af Amer: 55 mL/min — ABNORMAL LOW (ref >60)
Glucose, Bld: 92 mg/dL (ref 70–99)
Potassium: 4.4 mmol/L (ref 3.5–5.1)
Sodium: 141 mmol/L (ref 135–145)

## 2018-05-10 LAB — URINE CULTURE: Culture: NO GROWTH

## 2018-05-10 MED ORDER — CEPHALEXIN 250 MG PO CAPS: 250.0000 mg | ORAL_CAPSULE | Freq: Three times a day (TID) | ORAL | 0 refills | Status: AC

## 2018-05-10 NOTE — Discharge Summary (Signed)
[facility] -  at [facility]   PATIENT NAME: [name]    MR#:  [id number]  DATE OF BIRTH:  11-Aug-1940  DATE OF ADMISSION:  05/09/2018 ADMITTING PHYSICIAN: Adrian Saran, MD  DATE OF DISCHARGE: 05/10/2018  PRIMARY CARE PHYSICIAN: Ileana Ladd, MD    ADMISSION DIAGNOSIS:  Cerebral infarction (HCC) [I63.9] CVA (cerebral infarction) [I63.9]  DISCHARGE DIAGNOSIS:  Active Problems:   Acute cystitis   SECONDARY DIAGNOSIS:   Past Medical History:  Diagnosis Date  . Aortic stenosis    2013  . Arthritis    "hips" (12/21/2016)  . Atrial fibrillation [facility])    Status post cardioversion November 2013  . Chronic diastolic CHF (congestive heart failure) (HCC)   . Chronic kidney disease   . Complication of anesthesia    after hip surgery 06-2016 bp and heart rate dropped pt. has had a pacemaker placed since this event.  . History of blood transfusion 05/2016   "when I had my hip replaced" (12/21/2016)  . Hypertension   . Presence of permanent cardiac pacemaker    medtronic  . Shortness of breath    hx dyspnea and respiratory abnormalities    HOSPITAL COURSE:   78 year old female with history of chronic kidney disease stage II, pacemaker placement and chronic diastolic heart failure who presented via EMS after her son found her soiled.   1.  Hypothermia with elevated lactic acid level but not criteria for sepsis: Hypothermia has resolved  Her blood cultures are negative.  She does have UTI.  Urine culture is pending at this time.  We asked that PCP please follow-up on final urine culture. 2.  Acute cystitis: She will be discharged on oral Keflex. As mentioned above, PCP should follow-up on final urine culture.    3.  Nausea and dizziness:  She had no abnormalities on telemetry.  Carotid Doppler was essentially unremarkable without hemodynamic significant stenosis.  She will follow-up with her cardiologist.  Her nausea and dizziness have completely  resolved.  4.  Chronic kidney disease stage II: Creatinine is at baseline  5.  History of pacemaker: Patient is followed in [place] by her cardiologist  6.  PAF: Continue Eliquis and Toprol.   DISCHARGE CONDITIONS AND DIET:   Stable for discharge on cardiac diet  CONSULTS OBTAINED:    DRUG ALLERGIES:  No Known Allergies  DISCHARGE MEDICATIONS:   Allergies as of 05/10/2018   No Known Allergies     Medication List    TAKE these medications   alendronate 70 MG tablet Commonly known as:  FOSAMAX Take 70 mg by mouth once a week.   cephALEXin 250 MG capsule Commonly known as:  KEFLEX Take 1 capsule (250 mg total) by mouth 3 (three) times daily for 4 days.   Eliquis 2.5 MG Tabs tablet Generic drug:  apixaban Take 2.5 mg by mouth 2 (two) times daily.   metoprolol succinate 100 MG 24 hr tablet Commonly known as:  TOPROL-XL Take 100 mg by mouth daily.   multivitamin with minerals Tabs tablet Take 1 tablet by mouth daily.   spironolactone 25 MG tablet Commonly known as:  ALDACTONE TAKE 1 TABLET BY MOUTH  EVERY MORNING         Today   CHIEF COMPLAINT:  Doing very well without issues overnight   VITAL SIGNS:  Blood pressure 116/67, pulse 60, temperature 98 F (36.7 C), temperature source Oral, resp. rate 16, height 5' 4.5" (1.638 m), weight 59.9 kg, SpO2 97 %.  REVIEW OF SYSTEMS:  Review of Systems  Constitutional: Negative.  Negative for chills, fever and malaise/fatigue.  HENT: Negative.  Negative for ear discharge, ear pain, hearing loss, nosebleeds and sore throat.   Eyes: Negative.  Negative for blurred vision and pain.  Respiratory: Negative.  Negative for cough, hemoptysis, shortness of breath and wheezing.   Cardiovascular: Negative.  Negative for chest pain, palpitations and leg swelling.  Gastrointestinal: Negative.  Negative for abdominal pain, blood in stool, diarrhea, nausea and vomiting.  Genitourinary: Negative.  Negative for dysuria.   Musculoskeletal: Negative.  Negative for back pain.  Skin: Negative.   Neurological: Negative for dizziness, tremors, speech change, focal weakness, seizures and headaches.  Endo/Heme/Allergies: Negative.  Does not bruise/bleed easily.  Psychiatric/Behavioral: Negative.  Negative for depression, hallucinations and suicidal ideas.     PHYSICAL EXAMINATION:  GENERAL:  78 y.o.-year-old patient lying in the bed with no acute distress.  NECK:  Supple, no jugular venous distention. No thyroid enlargement, no tenderness.  LUNGS: Normal breath sounds bilaterally, no wheezing, rales,rhonchi  No use of accessory muscles of respiration.  CARDIOVASCULAR: S1, S2 normal. No murmurs, rubs, or gallops.  ABDOMEN: Soft, non-tender, non-distended. Bowel sounds present. No organomegaly or mass.  EXTREMITIES: No pedal edema, cyanosis, or clubbing.  PSYCHIATRIC: The patient is alert and oriented x 3.  SKIN: No obvious rash, lesion, or ulcer.   DATA REVIEW:   CBC Recent Labs  Lab 05/09/18 0537  WBC 5.1  HGB 13.9  HCT 44.3  PLT 97*    Chemistries  Recent Labs  Lab 05/09/18 0607 05/10/18 0503  NA 138 141  K 4.4 4.4  CL 108 113*  CO2 24 24  GLUCOSE 136* 92  BUN 25* 25*  CREATININE 1.05* 0.99  CALCIUM 9.3 9.0  AST 18  --   ALT 13  --   ALKPHOS 46  --   BILITOT 0.5  --     Cardiac Enzymes No results for input(s): TROPONINI in the last 168 hours.  Microbiology Results  @  RADIOLOGY:  Ct Abdomen Pelvis W Contrast  Result Date: 05/09/2018 CLINICAL DATA:  Generalized unwell feeling. Episode of vomiting and diarrhea. EXAM: CT ABDOMEN AND PELVIS WITH CONTRAST TECHNIQUE: Multidetector CT imaging of the abdomen and pelvis was performed using the standard protocol following bolus administration of intravenous contrast. CONTRAST:  75mL OMNIPAQUE IOHEXOL 300 MG/ML  SOLN COMPARISON:  12/31/2002-most recent available comparison FINDINGS: Lower chest: Borderline heart size. No acute  finding. Small fatty left Bochdalek's hernia. Hepatobiliary: Small sub capsular cystic density along the right liver.No evidence of biliary obstruction or stone. Pancreas: 6 mm cystic density at the pancreatic head is likely tortuous duct based on reformats, stable from prior. No main duct dilatation or acute finding. Spleen: Unremarkable. Adrenals/Urinary Tract: Negative adrenals. No hydronephrosis or stone. Bilateral renal cystic densities. Unremarkable bladder. Stomach/Bowel: Prominent submucosal thickness which is likely from underdistention. Of the gastric antrum there is no evidence of adjacent inflammation. No evidence of bowel inflammation, including appendicitis. Vascular/Lymphatic: Atherosclerotic calcification. No acute vascular finding no mass or adenopathy. Reproductive:Benign coarse calcification in the uterus Other: No ascites or pneumoperitoneum. Musculoskeletal: Bilateral hip arthroplasty which causes streak artifact over the pelvis. Lumbar spine degeneration with L4-5 anterolisthesis. IMPRESSION: No acute finding.  No bowel obstruction or appendicitis. Electronically Signed   By: Marnee Spring M.D.   On: 05/09/2018 07:22   US Carotid Bilateral  Result Date: 05/09/2018 CLINICAL DATA:  Hypertension and syncope. EXAM: BILATERAL CAROTID DUPLEX ULTRASOUND TECHNIQUE: Wallace Cullens  scale imaging, color Doppler and duplex ultrasound were performed of bilateral carotid and vertebral arteries in the neck. COMPARISON:  None. FINDINGS: Criteria: Quantification of carotid stenosis is based on velocity parameters that correlate the residual internal carotid diameter with NASCET-based stenosis levels, using the diameter of the distal internal carotid lumen as the denominator for stenosis measurement. The following velocity measurements were obtained: RIGHT ICA:  103/34 cm/sec CCA:  46/9 cm/sec SYSTOLIC ICA/CCA RATIO:  2.2 ECA:  44 cm/sec LEFT ICA:  85/25 cm/sec CCA:  46/10 cm/sec SYSTOLIC ICA/CCA RATIO:  1.8 ECA:   61 cm/sec RIGHT CAROTID ARTERY: The common carotid artery demonstrates mild intimal thickening. There is a mild amount of predominately calcified plaque at the level of the carotid bulb. Minimal plaque at the origin of the right ICA. Estimated right ICA stenosis is less than 50%. The right ICA is moderately tortuous. RIGHT VERTEBRAL ARTERY: Antegrade flow with normal waveform and velocity. LEFT CAROTID ARTERY: The common carotid artery demonstrates intimal thickening. No significant focal plaque identified. No evidence of left ICA stenosis. The left ICA is moderately tortuous. LEFT VERTEBRAL ARTERY: Antegrade flow with normal waveform and velocity. IMPRESSION: 1. Mild amount of plaque at the level of the right carotid bulb and ICA origin. Estimated right ICA stenosis is less than 50%. 2. No evidence of left ICA stenosis. 3. Tortuosity of bilateral internal carotid arteries consistent with longstanding hypertension. Electronically Signed   By: Irish Lack M.D.   On: 05/09/2018 10:11   Dg Chest Portable 1 View  Result Date: 05/09/2018 CLINICAL DATA:  Dyspnea EXAM: PORTABLE CHEST 1 VIEW COMPARISON:  12/22/2016 FINDINGS: Cardiomegaly. Aortic and mitral valve replacement. Dual-chamber pacer leads from the left. There is no edema, consolidation, effusion, or pneumothorax. Artifact from EKG leads. IMPRESSION: Stable from prior.  No evidence of acute disease. Electronically Signed   By: Marnee Spring M.D.   On: 05/09/2018 07:04      Allergies as of 05/10/2018   No Known Allergies     Medication List    TAKE these medications   alendronate 70 MG tablet Commonly known as:  FOSAMAX Take 70 mg by mouth once a week.   cephALEXin 250 MG capsule Commonly known as:  KEFLEX Take 1 capsule (250 mg total) by mouth 3 (three) times daily for 4 days.   Eliquis 2.5 MG Tabs tablet Generic drug:  apixaban Take 2.5 mg by mouth 2 (two) times daily.   metoprolol succinate 100 MG 24 hr tablet Commonly known as:   TOPROL-XL Take 100 mg by mouth daily.   multivitamin with minerals Tabs tablet Take 1 tablet by mouth daily.   spironolactone 25 MG tablet Commonly known as:  ALDACTONE TAKE 1 TABLET BY MOUTH  EVERY MORNING          Management plans discussed with the patient and she is in agreement. Stable for discharge home  Patient should follow up with pcp  CODE STATUS:     Code Status Orders  (From admission, onward)         Start     Ordered   05/09/18 0830  Do not attempt resuscitation (DNR)  Continuous    Question Answer Comment  In the event of cardiac or respiratory ARREST Do not call a "code blue"   In the event of cardiac or respiratory ARREST Do not perform Intubation, CPR, defibrillation or ACLS   In the event of cardiac or respiratory ARREST Use medication by any route, position, wound care, and  other measures to relive pain and suffering. May use oxygen, suction and manual treatment of airway obstruction as needed for comfort.      05/09/18 0829        Code Status History    Date Active Date Inactive Code Status Order ID Comments User Context   05/09/2018 0750 05/09/2018 0829 Full Code [id number]  Adrian Saran, MD ED   02/08/2017 1222 02/10/2017 1912 Full Code [id number]  Samson Frederic, MD Inpatient   12/21/2016 1528 12/22/2016 1934 Full Code [id number]  Marinus Maw, MD Inpatient   12/12/2016 1354 12/13/2016 2122 Full Code [id number]  Lars Masson, MD Inpatient   09/26/2016 1822 09/26/2016 2312 Full Code [id number]  Elder Negus, MD Inpatient   06/15/2016 2239 06/19/2016 1515 Full Code [id number]  Samson Frederic, MD Inpatient   06/24/2012 1817 06/25/2012 1830 Full Code [id number]  Pamella Pert, MD Inpatient   10/15/2011 0949 10/15/2011 0953 Full Code [id number]  Delight Ovens, MD Inpatient   10/12/2011 1351 10/15/2011 0949 Full Code [id number]  Daisy Blossom, RN Inpatient      TOTAL TIME TAKING CARE OF THIS PATIENT: 38 minutes.    Note: This dictation was  prepared with Dragon dictation along with smaller phrase technology. Any transcriptional errors that result from this process are unintentional.  Adrian Saran M.D on 05/10/2018 at 8:54 AM  Between 7am to 6pm - Pager - [phone] After 6pm go to [email or web address] - password [email or web address]  [facility]  Office  [phone]  CC: Primary care physician; Ileana Ladd, MD

## 2018-05-10 NOTE — TOC Initial Note (Signed)
Transition of Care [facility]) - Initial/Assessment Note    Patient Details  Name: [name] MRN: [id number] Date of Birth: September 29, 1940  Transition of [facility]) CM/SW Contact:    Gwenette Greet, RN Phone Number: 05/10/2018, 11:25 AM  Clinical Narrative: Admitted to [facility] under observation status with the diagnosis of acute cystitis. Lives alone. Granddaughter is [name] [phone]). Sees Dr. Modesto Charon as primary care physician.  Visiting son from [place] when she became sick.  Takes care of all basic activities of daily living herself, doesn't drive.  Rolling walker and cane in the home. [facility] in the past,  Family will transport                  Expected Discharge Plan: Home/Self Care Barriers to Discharge: No Barriers Identified   Patient Goals and CMS Choice Patient states their goals for this hospitalization and ongoing recovery are:: (Going back to son's home in [place] when discharged) CMS Medicare.gov Compare Post Acute Care list provided to:: Patient Choice offered to / list presented to : NA  Expected Discharge Plan and Services Expected Discharge Plan: Home/Self Care In-house Referral: NA Discharge Planning Services: CM Consult Post Acute Care Choice: NA Living arrangements for the past 2 months: Apartment Expected Discharge Date: 05/10/18               DME Arranged: N/A DME Agency: NA HH Arranged: NA(No follow -up per PT) HH Agency: NA  Prior Living Arrangements/Services Living arrangements for the past 2 months: Apartment Lives with:: Self Patient language and need for interpreter reviewed:: No Do you feel safe going back to the place where you live?: Yes      Need for Family Participation in Patient Care: No (Comment) Care giver support system in place?: No (comment) Current home services: (None) Criminal Activity/Legal Involvement Pertinent to Current Situation/Hospitalization: No - Comment as needed  Activities of Daily  Living Home Assistive Devices/Equipment: Dentures (specify type), Eyeglasses ADL Screening (condition at time of admission) Patient's cognitive ability adequate to safely complete daily activities?: Yes Is the patient deaf or have difficulty hearing?: No Does the patient have difficulty seeing, even when wearing glasses/contacts?: No Does the patient have difficulty concentrating, remembering, or making decisions?: No Patient able to express need for assistance with ADLs?: Yes Does the patient have difficulty dressing or bathing?: No Independently performs ADLs?: Yes (appropriate for developmental age) Does the patient have difficulty walking or climbing stairs?: No Weakness of Legs: None Weakness of Arms/Hands: None  Permission Sought/Granted Permission sought to share information with : Case Manager Permission granted to share information with : Yes, Verbal Permission Granted              Emotional Assessment Appearance:: Appears stated age Attitude/Demeanor/Rapport: (Calm) Affect (typically observed): Accepting Orientation: : Oriented to Self, Oriented to Place, Oriented to  Time, Oriented to Situation Alcohol / Substance Use: Not Applicable Psych Involvement: No (comment)  Admission diagnosis:  Cerebral infarction (HCC) [I63.9] CVA (cerebral infarction) [I63.9] Patient Active Problem List   Diagnosis Date Noted  . Acute cystitis 05/09/2018  . Primary osteoarthritis of left hip 02/08/2017  . Osteoarthritis of left hip 02/08/2017  . Sinus node dysfunction (HCC) 12/21/2016  . Chronic diastolic CHF (congestive heart failure) (HCC)   . 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 Anasarca 08/21/2011  . Hypokalemia 08/21/2011  . Hypoxia 08/18/2011  . Hypertension  PCP:  Ileana Ladd, MD Pharmacy:   [facility] [phone] [place], [place] - [phone] AT [facility] & [address] [place] [address] Phone: [phone] Fax: [phone]  [facility] - [place], [place] - [address] [facility] [address] [place] [address] [place] [address] Phone: [phone] Fax: [phone]     Social Determinants of Health (SDOH) Interventions    Readmission Risk Interventions No flowsheet data found.

## 2018-05-10 NOTE — Progress Notes (Signed)
Patient discharged home per MD order. Prescription given to patient. All discharge instructions given and all questions answered. 

## 2018-05-14 LAB — CULTURE, BLOOD (ROUTINE X 2)
Culture: NO GROWTH
Culture: NO GROWTH
SPECIAL REQUESTS: ADEQUATE

## 2018-05-15 ENCOUNTER — Other Ambulatory Visit: Payer: Self-pay | Admitting: Cardiovascular Disease

## 2018-05-15 NOTE — Telephone Encounter (Signed)
78 yo, 59.9kg, Scr 0.99 on 05/10/18 Last OV 07/27/17  Based on above pt would need 5mg   BID. Will investigate dose.

## 2018-05-15 NOTE — Telephone Encounter (Signed)
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 Will continue current dose and send in refill.

## 2018-06-05 DIAGNOSIS — Z4502 Encounter for adjustment and management of automatic implantable cardiac defibrillator: Secondary | ICD-10-CM | POA: Diagnosis not present

## 2018-06-05 DIAGNOSIS — I495 Sick sinus syndrome: Secondary | ICD-10-CM | POA: Diagnosis not present

## 2018-06-05 DIAGNOSIS — Z950 Presence of cardiac pacemaker: Secondary | ICD-10-CM

## 2018-06-06 ENCOUNTER — Other Ambulatory Visit: Payer: Self-pay

## 2018-06-06 ENCOUNTER — Ambulatory Visit (INDEPENDENT_AMBULATORY_CARE_PROVIDER_SITE_OTHER): Payer: Medicare Other | Admitting: Cardiology

## 2018-06-06 ENCOUNTER — Encounter: Payer: Self-pay | Admitting: Cardiology

## 2018-06-06 VITALS — BP 130/68 | Ht 64.0 in | Wt 133.0 lb

## 2018-06-06 DIAGNOSIS — Z952 Presence of prosthetic heart valve: Secondary | ICD-10-CM | POA: Diagnosis not present

## 2018-06-06 DIAGNOSIS — I495 Sick sinus syndrome: Secondary | ICD-10-CM

## 2018-06-06 DIAGNOSIS — Z950 Presence of cardiac pacemaker: Secondary | ICD-10-CM

## 2018-06-06 DIAGNOSIS — I480 Paroxysmal atrial fibrillation: Secondary | ICD-10-CM | POA: Insufficient documentation

## 2018-06-06 DIAGNOSIS — T82857A Stenosis of cardiac prosthetic devices, implants and grafts, initial encounter: Secondary | ICD-10-CM

## 2018-06-06 DIAGNOSIS — I5032 Chronic diastolic (congestive) heart failure: Secondary | ICD-10-CM | POA: Diagnosis not present

## 2018-06-06 DIAGNOSIS — I10 Essential (primary) hypertension: Secondary | ICD-10-CM | POA: Diagnosis not present

## 2018-06-06 DIAGNOSIS — Z8679 Personal history of other diseases of the circulatory system: Secondary | ICD-10-CM

## 2018-06-06 HISTORY — DX: Paroxysmal atrial fibrillation: I48.0

## 2018-06-06 HISTORY — DX: Personal history of other diseases of the circulatory system: Z86.79

## 2018-06-06 MED ORDER — METOPROLOL SUCCINATE ER 100 MG PO TB24: 100.0000 mg | ORAL_TABLET | Freq: Every day | ORAL | 3 refills | Status: DC

## 2018-06-06 NOTE — Progress Notes (Signed)
Virtual Visit via Telephone Note: Patient unable to use video assisted device.  This visit type was conducted due to national recommendations for restrictions regarding the COVID-19 Pandemic (e.g. social distancing).  This format is felt to be most appropriate for this patient at this time.  All issues noted in this document were discussed and addressed.  No physical exam was performed.  The patient has consented to conduct a Telehealth visit and understands insurance will be billed.   I connected with Ms. Arlis Porta today by [facility] and verified that I am speaking with the correct person using two identifiers.   I discussed the limitations of evaluation and management by telemedicine and the availability of in person appointments. The patient expressed understanding and agreed to proceed.   I have discussed with patient regarding the safety during COVID Pandemic and steps and precautions to be taken including social distancing, frequent hand wash and use of detergent soap, gels with the patient. I asked the patient to avoid touching mouth, nose, eyes, ears with the hands. I encouraged regular walking around the neighborhood and exercise and regular diet, as long as social distancing can be maintained.  Subjective:  Primary Physician/Referring:  Ileana Ladd, MD  Patient ID: [name], female    DOB: June 20, 1940, 78 y.o.   MRN: [id number]  Chief Complaint  Patient presents with  . Hypertension  . Follow-up    HPI: HELGA ASBURY  is a 78 y.o. female  with  prior history of severe aortic regurgitation and moderately severe mitral regurgitation. She underwent aortic pericardial tissue valve replacement and mitral valve ring placement on 08/12/2011 with bioprosthetic valves by Dr. Evelene Croon. Since then her ejection fraction is normalized.  She underwent bilateral hip replacement electively in April 2018 and also 02/16/2017 right and left respectively, initial one complicated by  hypotension and marked bradycardia. She underwent permanent transvenous pacemaker implantation on 12/21/2016 prior to her 2nd hip replacement and did well postoperatively. 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  She was admitted to [facility] on 05/09/2018 with confusional state and loss of bowel movement, was found to be hypothermic and had severe UTI for which she was treated and discharged home.  Initially thought whether she had TIA, carotid duplex did not reveal any significant abnormality, CT of the abdomen also did not reveal anything significant.  She is presently doing well and essentially remains asymptomatic.  She has remote history of atrial fibrillation in 2013 following surgery for valve, but has not had any recurrence.  She presently has pacemaker in place for sinus node dysfunction.  Past Medical History:  Diagnosis Date  . Aortic stenosis    2013  . Arthritis    "hips" (12/21/2016)  . Bioprosthetic 28 mm AV and 21 mm MV 10/11/2016  . Chronic diastolic CHF (congestive heart failure) (HCC)   . Chronic kidney disease   . Complication of anesthesia    after hip surgery 06-2016 bp and heart rate dropped pt. has had a pacemaker placed since this event.  . H/O atrial fibrillation without current medication 06/06/2018   2013 post AV and MV replacement and no recurrence. On Eliquis for AV thrombotic degeneration  . History of blood transfusion 05/2016   "when  I had my hip replaced" (12/21/2016)  . Hypertension   . Presence of permanent cardiac pacemaker    medtronic  . Shortness of breath    hx dyspnea and respiratory abnormalities    Past Surgical History:  Procedure Laterality Date  . AORTIC VALVE REPLACEMENT  10/12/2011    Procedure: AORTIC VALVE REPLACEMENT (AVR);  Surgeon: Alleen Borne, MD;  Location: [facility] OR;  Service: Open Heart Surgery;  Laterality: N/A;  . CARDIAC CATHETERIZATION     "I've had several; no OR" (12/21/2016)  . CARDIAC VALVE REPLACEMENT    . CARDIOVERSION  12/26/2011   Procedure: CARDIOVERSION;  Surgeon: Pamella Pert, MD;  Location: [facility] ENDOSCOPY;  Service: Cardiovascular;  Laterality: N/A;  . CORONARY ANGIOGRAPHY N/A 09/26/2016   Procedure: CORONARY ANGIOGRAPHY;  Surgeon: Elder Negus, MD;  Location: [facility] INVASIVE CV LAB;  Service: Cardiovascular;  Laterality: N/A;  . EXPLORATION POST OPERATIVE OPEN HEART  10/12/2011   Procedure: EXPLORATION POST OPERATIVE OPEN HEART;  Surgeon: Alleen Borne, MD;  Location: [facility] OR;  Service: Open Heart Surgery;  Laterality: N/A;  . INSERT / REPLACE / REMOVE PACEMAKER  12/21/2016  . JOINT REPLACEMENT     right hip 06-2016 Left hip 02-08-17 Dr. Linna Caprice  . LEFT AND RIGHT HEART CATHETERIZATION WITH CORONARY ANGIOGRAM N/A 09/11/2011   Procedure: LEFT AND RIGHT HEART CATHETERIZATION WITH CORONARY ANGIOGRAM;  Surgeon: Pamella Pert, MD;  Location: [facility] CATH LAB;  Service: Cardiovascular;  Laterality: N/A;  . MITRAL VALVE REPAIR  10/12/2011   Procedure: MITRAL VALVE REPAIR (MVR);  Surgeon: Alleen Borne, MD;  Location: [facility] OR;  Service: Open Heart Surgery;  Laterality: N/A;  mitral valve annuloplasty  . PACEMAKER IMPLANT N/A 12/21/2016   Procedure: PACEMAKER IMPLANT;  Surgeon: Marinus Maw, MD;  Location: [facility] INVASIVE CV LAB;  Service: Cardiovascular;  Laterality: N/A;  . RIGHT HEART CATH N/A 09/26/2016   Procedure: RIGHT HEART CATH;  Surgeon: Elder Negus, MD;  Location: [facility] INVASIVE CV LAB;  Service: Cardiovascular;  Laterality: N/A;  . TEE WITHOUT CARDIOVERSION N/A 08/22/2016   Procedure: TRANSESOPHAGEAL ECHOCARDIOGRAM (TEE);  Surgeon: Yates Decamp, MD;  Location: [facility] ENDOSCOPY;  Service: Cardiovascular;  Laterality: N/A;  . TEE WITHOUT CARDIOVERSION N/A  12/12/2016   Procedure: TRANSESOPHAGEAL ECHOCARDIOGRAM (TEE);  Surgeon: Lars Masson, MD;  Location: [facility] ENDOSCOPY;  Service: Cardiovascular;  Laterality: N/A;  . TOTAL HIP ARTHROPLASTY Right 06/15/2016   Procedure: RIGHT TOTAL HIP ARTHROPLASTY ANTERIOR APPROACH;  Surgeon: Samson Frederic, MD;  Location: [facility] ORS;  Service: Orthopedics;  Laterality: Right;  Dr. needs RNFA  . TOTAL HIP ARTHROPLASTY Left 02/08/2017   Procedure: LEFT TOTAL HIP ARTHROPLASTY ANTERIOR APPROACH;  Surgeon: Samson Frederic, MD;  Location: [facility] ORS;  Service: Orthopedics;  Laterality: Left;  Needs RNFA    Social History   Socioeconomic History  . Marital status: Single    Spouse name: Not on file  . Number of children: 2  . Years of education: Not on file  . Highest education level: Not on file  Occupational History  . Not on file  Social Needs  . Financial resource strain: Not on file  . Food insecurity:    Worry: Not on file    Inability: Not on file  . Transportation needs:    Medical: Not on file    Non-medical: Not on file  Tobacco Use  . Smoking status: Never Smoker  . Smokeless tobacco: Never Used  Substance and Sexual Activity  . Alcohol use: No  .  Drug use: No  . Sexual activity: Never  Lifestyle  . Physical activity:    Days per week: Not on file    Minutes per session: Not on file  . Stress: Not on file  Relationships  . Social connections:    Talks on phone: Not on file    Gets together: Not on file    Attends religious service: Not on file    Active member of club or organization: Not on file    Attends meetings of clubs or organizations: Not on file    Relationship status: Not on file  . Intimate partner violence:    Fear of current or ex partner: Not on file    Emotionally abused: Not on file    Physically abused: Not on file    Forced sexual activity: Not on file  Other Topics Concern  . Not on file  Social History Narrative  . Not on file    Current Outpatient  Medications on File Prior to Visit  Medication Sig Dispense Refill  . alendronate (FOSAMAX) 70 MG tablet Take 70 mg by mouth once a week.    Marland Kitchen. ELIQUIS 2.5 MG TABS tablet TAKE 1 TABLET(2.5 MG) BY MOUTH TWICE DAILY 60 tablet 5  . Multiple Vitamin (MULTIVITAMIN WITH MINERALS) TABS tablet Take 1 tablet by mouth daily.    Marland Kitchen. spironolactone (ALDACTONE) 25 MG tablet TAKE 1 TABLET BY MOUTH  EVERY MORNING 90 tablet 1  . Vitamin D, Ergocalciferol, (DRISDOL) 1.25 MG (50000 UT) CAPS capsule Take 50,000 Units by mouth daily.     No current facility-administered medications on file prior to visit.     Review of Systems  Constitution: Negative for chills, decreased appetite, malaise/fatigue and weight gain.  Cardiovascular: Negative for dyspnea on exertion, leg swelling and syncope.  Endocrine: Negative for cold intolerance.  Hematologic/Lymphatic: Does not bruise/bleed easily.  Musculoskeletal: Negative for joint swelling.  Gastrointestinal: Negative for abdominal pain, anorexia and change in bowel habit.  Neurological: Negative for headaches and light-headedness.  Psychiatric/Behavioral: Negative for depression and substance abuse.  All other systems reviewed and are negative.     Objective:  Blood pressure 130/68, height 5\' 4"  (1.626 m), weight 133 lb (60.3 kg). Body mass index is 22.83 kg/m.  Physical examination was not performed as this was a virtual visit, prior examination from 11/30/2017 is as below.  Physical Exam  Constitutional: She appears well-developed and well-nourished. No distress.  HENT:  Head: Atraumatic.  Eyes: Conjunctivae are normal.  Neck: Neck supple. No JVD present. No thyromegaly present.  Cardiovascular: Normal rate, regular rhythm, S1 normal, S2 normal, intact distal pulses and normal pulses. Exam reveals no gallop.  Murmur heard.  Harsh mid to late systolic murmur is present with a grade of 3/6 at the upper right sternal border radiating to the neck. Pulses:       Carotid pulses are on the right side with bruit and on the left side with bruit. II/VI SEM in the right parasternal border  Pulmonary/Chest: Effort normal and breath sounds normal.  Abdominal: Soft. Bowel sounds are normal.  Musculoskeletal: Normal range of motion.        General: No edema.  Neurological: She is alert.  Skin: Skin is warm and dry.  Psychiatric: She has a normal mood and affect.   Radiology: No results found.  Laboratory Examination:  CMP Latest Ref Rng & Units 05/10/2018 05/09/2018 02/09/2017  Glucose 70 - 99 mg/dL 92 956(O136(H) 130(Q105(H)  BUN 8 - 23  mg/dL 41(C) 30(D) 20  Creatinine 0.44 - 1.00 mg/dL 3.14 3.88(I) 7.57(V)  Sodium 135 - 145 mmol/L 141 138 136  Potassium 3.5 - 5.1 mmol/L 4.4 4.4 4.8  Chloride 98 - 111 mmol/L 113(H) 108 106  CO2 22 - 32 mmol/L 24 24 26   Calcium 8.9 - 10.3 mg/dL 9.0 9.3 9.0  Total Protein 6.5 - 8.1 g/dL - 7.2 -  Total Bilirubin 0.3 - 1.2 mg/dL - 0.5 -  Alkaline Phos 38 - 126 U/L - 46 -  AST 15 - 41 U/L - 18 -  ALT 0 - 44 U/L - 13 -   CBC Latest Ref Rng & Units 05/09/2018 04/18/2017 02/10/2017  WBC 4.0 - 10.5 K/uL 5.1 6.3 10.6(H)  Hemoglobin 12.0 - 15.0 g/dL 72.8 20.6 10.5(L)  Hematocrit 36.0 - 46.0 % 44.3 36.2 30.8(L)  Platelets 150 - 400 K/uL 97(L) 159 96(L)   Lipid Panel     Component Value Date/Time   CHOL 141 08/22/2011 0635   TRIG 72 08/22/2011 0635   HDL 67 08/22/2011 0635   CHOLHDL 2.1 08/22/2011 0635   VLDL 14 08/22/2011 0635   LDLCALC 60 08/22/2011 0635   HEMOGLOBIN A1C Lab Results  Component Value Date   HGBA1C 6.3 (H) 10/10/2011   MPG 134 (H) 10/10/2011   TSH No results for input(s): TSH in the last 8760 hours.  Cardiac studies:   Valve replacement Operative Report 10/12/11: 28 mm AV and MV 21 mm Edwards pericardial valve replacement for severe AI and mod MR by Dr. Laneta Simmers, Laneta Simmers.  Coronary angiogram 09/26/2016 Normal coronary arteries. Mild to Moderate pulmonary hypertension. CO 3.99, CI 2.42  Pacemaker  implantation 12/21/2016 with Medtronic Azure MRI dual chamber pacemaker using His Bundle Pacing  by Lewayne Bunting, MD  Carotid artery duplex 05/09/2018  1. Mild amount of plaque at the level of the right carotid bulb and ICA origin. Estimated right ICA stenosis is less than 50%.  2. No evidence of left ICA stenosis. 3. Tortuosity of bilateral internal carotid arteries consistent with longstanding hypertension.  Echocardi[facility] 07/27/2017 at [facility]:  Normal LV systolic function. Peak and mean transaortic valve gradients are 59 and 31 mmHg with a peak systolic velocity of 3.8 m/s and a dimensionless index of 0.25. This is compared to her echo study from 3 months ago (prior to apixaban) which showed peak and mean gradients of 80 and 46 mm, respectively and a peak systolic velocity of 4.4 m/s.  Assessment:    Stenosis of prosthetic aortic valve - Plan: PCV ECHOCARDIOGRAM COMPLETE  Bioprosthetic 28 mm AV and 21 mm MV 10/12/11 - Plan: PCV ECHOCARDIOGRAM COMPLETE  Chronic diastolic CHF (congestive heart failure) (HCC)  Essential hypertension - Plan: metoprolol succinate (TOPROL-XL) 100 MG 24 hr tablet  Sinus node dysfunction (HCC)  Presence of dual chamber Medtronic Azure MRI permanent cardiac pacemaker using His bundle pacing  H/O atrial fibrillation in 2013 following valve surgery, no recurrence. On Eliquis for thrombotic AV  Remote pacemaker transmission 4.15.20: No mode switches. No further  brief NSVT (chronic),Battery longevity is 11.2 years . RA pacing is 97.9 %, RV pacing is 28.3 %.  Clinic 04/23/2017: Normal function. Underlying sinus 35/min. 100% AP. VHR episodes.   EKG 11/30/2017: A paced rhythm with first-degree AV block at rate of 85 bpm, left axis deviation, left anterior fascicular block, poor R-wave progression, cannot exclude anteroseptal infarct old. LVH.  Recommendations:    She is doing well and denies PND or orthopnea, no chest pain.  Blood  pressure is well controlled.  I  have refilled her metoprolol.  She will need repeat echocardiogram to follow-up on prosthetic aortic valve stenosis, as per extent to structural heart disease team recommendation, she is presently on Eliquis for degenerated and thrombotic appearing prosthetic aortic valve and gradients have decreased since being on anticoagulation.  Continue the same.  CBC has remained stable.  I also reviewed her records from recent hospitalization when she was admitted with urosepsis.  Her pacemaker function is normal.  I reviewed this with her.  No changes in the medications were done today.  I would like to see her back after the echocardiogram in 3 months.  Yates Decamp, MD, [facility] 06/06/2018, 12:05 PM [facility] [place] Pager: [phone] Office: [phone] If no answer Cell [phone]

## 2018-07-08 ENCOUNTER — Other Ambulatory Visit: Payer: Self-pay | Admitting: Pharmacist

## 2018-07-08 MED ORDER — APIXABAN 2.5 MG PO TABS: 2.5000 mg | ORAL_TABLET | Freq: Two times a day (BID) | ORAL | 1 refills | Status: DC

## 2018-07-08 NOTE — Progress Notes (Signed)
On Eliquis for degenerated and thrombotic appearing prosthetic aortic valve per Dr Excell Seltzer Feb 2019.

## 2018-09-04 DIAGNOSIS — Z45018 Encounter for adjustment and management of other part of cardiac pacemaker: Secondary | ICD-10-CM | POA: Diagnosis not present

## 2018-09-04 DIAGNOSIS — Z950 Presence of cardiac pacemaker: Secondary | ICD-10-CM

## 2018-09-04 DIAGNOSIS — I495 Sick sinus syndrome: Secondary | ICD-10-CM | POA: Diagnosis not present

## 2018-09-07 ENCOUNTER — Encounter: Payer: Self-pay | Admitting: Cardiology

## 2018-09-07 DIAGNOSIS — Z45018 Encounter for adjustment and management of other part of cardiac pacemaker: Secondary | ICD-10-CM

## 2018-09-07 HISTORY — DX: Encounter for adjustment and management of other part of cardiac pacemaker: Z45.018

## 2018-09-09 ENCOUNTER — Telehealth: Payer: Self-pay

## 2018-09-09 ENCOUNTER — Other Ambulatory Visit: Payer: Medicare Other

## 2018-09-09 NOTE — Telephone Encounter (Signed)
Pt aware, states she feels good and will call when she needs [place]

## 2018-09-09 NOTE — Telephone Encounter (Signed)
-----   Message from Adrian Prows, MD sent at 09/07/2018  3:34 PM EDT ----- Regarding: Pacemaker Normal function. No significant irregular heart beats. JG

## 2018-09-16 ENCOUNTER — Ambulatory Visit: Payer: Medicare Other | Admitting: Cardiology

## 2018-09-17 ENCOUNTER — Other Ambulatory Visit: Payer: Self-pay

## 2018-09-17 MED ORDER — SPIRONOLACTONE 25 MG PO TABS: 25.0000 mg | ORAL_TABLET | Freq: Every morning | ORAL | 0 refills | Status: DC

## 2018-10-14 ENCOUNTER — Other Ambulatory Visit: Payer: Self-pay

## 2018-10-14 ENCOUNTER — Ambulatory Visit (INDEPENDENT_AMBULATORY_CARE_PROVIDER_SITE_OTHER): Payer: Medicare Other

## 2018-10-14 DIAGNOSIS — Z952 Presence of prosthetic heart valve: Secondary | ICD-10-CM

## 2018-10-14 DIAGNOSIS — Z0189 Encounter for other specified special examinations: Secondary | ICD-10-CM

## 2018-10-14 DIAGNOSIS — T82857A Stenosis of cardiac prosthetic devices, implants and grafts, initial encounter: Secondary | ICD-10-CM

## 2018-10-16 NOTE — Progress Notes (Signed)
Exactly. Good thoughts on your part [facility]

## 2018-10-21 ENCOUNTER — Ambulatory Visit: Payer: Medicare Other | Admitting: Cardiology

## 2018-10-24 ENCOUNTER — Ambulatory Visit: Payer: Medicare Other | Admitting: Cardiology

## 2018-10-31 ENCOUNTER — Ambulatory Visit (INDEPENDENT_AMBULATORY_CARE_PROVIDER_SITE_OTHER): Payer: Medicare Other | Admitting: Cardiology

## 2018-10-31 ENCOUNTER — Encounter: Payer: Self-pay | Admitting: Cardiology

## 2018-10-31 ENCOUNTER — Other Ambulatory Visit: Payer: Self-pay

## 2018-10-31 VITALS — BP 134/88 | HR 74 | Ht 64.0 in | Wt 151.7 lb

## 2018-10-31 DIAGNOSIS — I495 Sick sinus syndrome: Secondary | ICD-10-CM

## 2018-10-31 DIAGNOSIS — T82857A Stenosis of cardiac prosthetic devices, implants and grafts, initial encounter: Secondary | ICD-10-CM

## 2018-10-31 DIAGNOSIS — I480 Paroxysmal atrial fibrillation: Secondary | ICD-10-CM | POA: Diagnosis not present

## 2018-10-31 DIAGNOSIS — Z45018 Encounter for adjustment and management of other part of cardiac pacemaker: Secondary | ICD-10-CM

## 2018-10-31 DIAGNOSIS — I10 Essential (primary) hypertension: Secondary | ICD-10-CM | POA: Diagnosis not present

## 2018-10-31 DIAGNOSIS — Z950 Presence of cardiac pacemaker: Secondary | ICD-10-CM

## 2018-10-31 NOTE — Progress Notes (Signed)
Primary Physician:  Ileana Ladd, MD   Patient ID: [name], female    DOB: [birth year], 78 y.o.   MRN: [id number]  Subjective:    Chief Complaint  Patient presents with  . Hypertension  . Follow-up    HPI: RUBBY [name]  is a 78 y.o. female  with prior history of severe aortic regurgitation and moderately severe mitral regurgitation. She underwent aortic pericardial tissue valve replacement and mitral valve ring placement on 08/12/2011 with bioprosthetic valves by Dr. Evelene Croon. Since then her ejection fraction is normalized.  She underwent bilateral hip replacement electively in April 2018 and also 02/16/2017 right and left respectively, initial one complicated by hypotension and marked bradycardia. She underwent permanent transvenous pacemaker implantation on 12/21/2016 prior to her 2nd hip replacement and did well postoperatively. 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  She was admitted to [facility] on 05/09/2018 with confusional state and loss of bowel movement, was found to be hypothermic and had severe UTI for which she was treated and discharged home.  Initially thought whether she had TIA, carotid duplex did not reveal any significant abnormality, CT of the abdomen also did not reveal anything significant.  She is presently doing well and essentially remains asymptomatic.  She has remote history of atrial fibrillation in 2013 following surgery for valve, but has not had any recurrence since.  She presently has pacemaker in place for sinus node dysfunction.  Past Medical History:  Diagnosis Date  . Aortic stenosis    2013  . Arthritis    "hips" (12/21/2016)  . Bioprosthetic 28 mm AV and 21 mm MV 10/11/2016   . Chronic diastolic CHF (congestive heart failure) (HCC)   . Chronic kidney disease   . Complication of anesthesia    after hip surgery 06-2016 bp and heart rate dropped pt. has had a pacemaker placed since this event.  . Encounter for care of pacemaker 09/07/2018  . H/O atrial fibrillation without current medication 06/06/2018   2013 post AV and MV replacement and no recurrence. On Eliquis for AV thrombotic degeneration  . History of blood transfusion 05/2016   "when I had my hip replaced" (12/21/2016)  . Hypertension   . Paroxysmal atrial fibrillation (HCC) 06/06/2018   2013 post AV and MV replacement and no recurrence. On Eliquis for AV thrombotic degeneration  . Presence of permanent cardiac pacemaker    medtronic  . Shortness of breath    hx dyspnea and respiratory abnormalities    Past Surgical History:  Procedure Laterality Date  . AORTIC VALVE REPLACEMENT  10/12/2011   Procedure: AORTIC VALVE REPLACEMENT (AVR);  Surgeon: Alleen Borne, MD;  Location: [facility] OR;  Service: Open Heart Surgery;  Laterality: N/A;  . CARDIAC CATHETERIZATION     "I've had several; no OR" (12/21/2016)  . CARDIAC VALVE REPLACEMENT    . CARDIOVERSION  12/26/2011   Procedure: CARDIOVERSION;  Surgeon: Pamella Pert, MD;  Location: [facility] ENDOSCOPY;  Service: Cardiovascular;  Laterality: N/A;  . CORONARY ANGIOGRAPHY N/A 09/26/2016   Procedure: CORONARY ANGIOGRAPHY;  Surgeon: Elder Negus, MD;  Location: [facility] INVASIVE CV LAB;  Service: Cardiovascular;  Laterality: N/A;  . EXPLORATION POST OPERATIVE OPEN HEART  10/12/2011   Procedure: EXPLORATION  POST OPERATIVE OPEN HEART;  Surgeon: Alleen BorneBryan K Bartle, MD;  [facility];  Service: Open Heart Surgery;  Laterality: N/A;  . INSERT / REPLACE / REMOVE PACEMAKER  12/21/2016  . JOINT REPLACEMENT     right hip 06-2016 Left hip 02-08-17 Dr. Linna CapriceSwinteck  . LEFT AND RIGHT HEART CATHETERIZATION WITH CORONARY ANGIOGRAM N/A 09/11/2011   Procedure: LEFT AND RIGHT HEART  CATHETERIZATION WITH CORONARY ANGIOGRAM;  Surgeon: Pamella PertJagadeesh R Ganji, MD;  [facility];  Service: Cardiovascular;  Laterality: N/A;  . MITRAL VALVE REPAIR  10/12/2011   Procedure: MITRAL VALVE REPAIR (MVR);  Surgeon: Alleen BorneBryan K Bartle, MD;  [facility];  Service: Open Heart Surgery;  Laterality: N/A;  mitral valve annuloplasty  . PACEMAKER IMPLANT N/A 12/21/2016   Procedure: PACEMAKER IMPLANT;  Surgeon: Marinus Mawaylor, Gregg W, MD;  [facility];  Service: Cardiovascular;  Laterality: N/A;  . RIGHT HEART CATH N/A 09/26/2016   Procedure: RIGHT HEART CATH;  Surgeon: Elder NegusPatwardhan, Manish J, MD;  Location: [facility] INVASIVE CV LAB;  Service: Cardiovascular;  Laterality: N/A;  . TEE WITHOUT CARDIOVERSION N/A 08/22/2016   Procedure: TRANSESOPHAGEAL ECHOCARDIOGRAM (TEE);  Surgeon: Yates DecampGanji, Jay, MD;  [facility];  Service: Cardiovascular;  Laterality: N/A;  . TEE WITHOUT CARDIOVERSION N/A 12/12/2016   Procedure: TRANSESOPHAGEAL ECHOCARDIOGRAM (TEE);  Surgeon: Lars MassonNelson, Katarina H, MD;  [facility];  Service: Cardiovascular;  Laterality: N/A;  . TOTAL HIP ARTHROPLASTY Right 06/15/2016   Procedure: RIGHT TOTAL HIP ARTHROPLASTY ANTERIOR APPROACH;  Surgeon: Samson FredericBrian Swinteck, MD;  Location: [facility] ORS;  Service: Orthopedics;  Laterality: Right;  Dr. needs RNFA  . TOTAL HIP ARTHROPLASTY Left 02/08/2017   Procedure: LEFT TOTAL HIP ARTHROPLASTY ANTERIOR APPROACH;  Surgeon: Samson FredericSwinteck, Brian, MD;  Location: [facility] ORS;  Service: Orthopedics;  Laterality: Left;  Needs RNFA    Social History   Socioeconomic History  . Marital status: Single    Spouse name: Not on file  . Number of children: 2  . Years of education: Not on file  . Highest education level: Not on file  Occupational History  . Not on file  Social Needs  . Financial resource strain: Not on file  . Food insecurity    Worry: Not on file    Inability: Not on file  . Transportation needs    Medical: Not on file    Non-medical: Not  on file  Tobacco Use  . Smoking status: Never Smoker  . Smokeless tobacco: Never Used  Substance and Sexual Activity  . Alcohol use: No  . Drug use: No  . Sexual activity: Never  Lifestyle  . Physical activity    Days per week: Not on file    Minutes per session: Not on file  . Stress: Not on file  Relationships  . Social Musicianconnections    Talks on phone: Not on file    Gets together: Not on file    Attends religious service: Not on file    Active member of club or organization: Not on file    Attends meetings of clubs or organizations: Not on file    Relationship status: Not on file  . Intimate partner violence    Fear of current or ex partner: Not on file    Emotionally abused: Not on file    Physically abused: Not on file    Forced sexual activity: Not on file  Other Topics Concern  . Not on file  Social History Narrative  . Not on file  Review of Systems  Constitution: Negative for decreased appetite, malaise/fatigue, weight gain and weight loss.  Eyes: Negative for visual disturbance.  Cardiovascular: Negative for chest pain, claudication, dyspnea on exertion, leg swelling, orthopnea, palpitations and syncope.  Respiratory: Negative for hemoptysis and wheezing.   Endocrine: Negative for cold intolerance and heat intolerance.  Hematologic/Lymphatic: Does not bruise/bleed easily.  Skin: Negative for nail changes.  Musculoskeletal: Negative for muscle weakness and myalgias.  Gastrointestinal: Negative for abdominal pain, change in bowel habit, nausea and vomiting.  Neurological: Negative for difficulty with concentration, dizziness, focal weakness and headaches.  Psychiatric/Behavioral: Negative for altered mental status and suicidal ideas.  All other systems reviewed and are negative.     Objective:  Blood pressure 134/88, pulse 74, height 5\' 4"  (1.626 m), weight 151 lb 11.2 oz (68.8 kg), SpO2 98 %. Body mass index is 26.04 kg/m.    Physical Exam  Constitutional:  She appears well-developed and well-nourished. No distress.  HENT:  Head: Atraumatic.  Eyes: Conjunctivae are normal.  Neck: Neck supple. No JVD present. No thyromegaly present.  Cardiovascular: Normal rate, regular rhythm, S1 normal, S2 normal, intact distal pulses and normal pulses. Exam reveals no gallop.  Murmur heard.  Harsh mid to late systolic murmur is present with a grade of 3/6 at the upper right sternal border radiating to the neck. Pulses:      Carotid pulses are on the right side with bruit and on the left side with bruit. II/VI SEM in the right parasternal border  Pulmonary/Chest: Effort normal and breath sounds normal.  Abdominal: Soft. Bowel sounds are normal.  Musculoskeletal: Normal range of motion.        General: No edema.  Neurological: She is alert.  Skin: Skin is warm and dry.  Psychiatric: She has a normal mood and affect.   Radiology: No results found.  Laboratory examination:    CMP Latest Ref Rng & Units 05/10/2018 05/09/2018 02/09/2017  Glucose 70 - 99 mg/dL 92 161(W136(H) 960(A105(H)  BUN 8 - 23 mg/dL 54(U25(H) 98(J25(H) 20  Creatinine 0.44 - 1.00 mg/dL 1.910.99 4.78(G1.05(H) 9.56(O1.18(H)  Sodium 135 - 145 mmol/L 141 138 136  Potassium 3.5 - 5.1 mmol/L 4.4 4.4 4.8  Chloride 98 - 111 mmol/L 113(H) 108 106  CO2 22 - 32 mmol/L 24 24 26   Calcium 8.9 - 10.3 mg/dL 9.0 9.3 9.0  Total Protein 6.5 - 8.1 g/dL - 7.2 -  Total Bilirubin 0.3 - 1.2 mg/dL - 0.5 -  Alkaline Phos 38 - 126 U/L - 46 -  AST 15 - 41 U/L - 18 -  ALT 0 - 44 U/L - 13 -   CBC Latest Ref Rng & Units 05/09/2018 04/18/2017 02/10/2017  WBC 4.0 - 10.5 K/uL 5.1 6.3 10.6(H)  Hemoglobin 12.0 - 15.0 g/dL 13.013.9 86.512.2 10.5(L)  Hematocrit 36.0 - 46.0 % 44.3 36.2 30.8(L)  Platelets 150 - 400 K/uL 97(L) 159 96(L)   Lipid Panel     Component Value Date/Time   CHOL 141 08/22/2011 0635   TRIG 72 08/22/2011 0635   HDL 67 08/22/2011 0635   CHOLHDL 2.1 08/22/2011 0635   VLDL 14 08/22/2011 0635   LDLCALC 60 08/22/2011 0635    HEMOGLOBIN A1C Lab Results  Component Value Date   HGBA1C 6.3 (H) 10/10/2011   MPG 134 (H) 10/10/2011   TSH No results for input(s): TSH in the last 8760 hours.  PRN Meds:. There are no discontinued medications. Current Meds  Medication Sig  . alendronate (FOSAMAX)  70 MG tablet Take 70 mg by mouth once a week.  Marland Kitchen apixaban (ELIQUIS) 2.5 MG TABS tablet Take 1 tablet (2.5 mg total) by mouth 2 (two) times daily.  . metoprolol succinate (TOPROL-XL) 100 MG 24 hr tablet Take 1 tablet (100 mg total) by mouth daily.  . Multiple Vitamin (MULTIVITAMIN WITH MINERALS) TABS tablet Take 1 tablet by mouth daily.  Marland Kitchen spironolactone (ALDACTONE) 25 MG tablet Take 1 tablet (25 mg total) by mouth every morning.  . Vitamin D, Ergocalciferol, (DRISDOL) 1.25 MG (50000 UT) CAPS capsule Take 50,000 Units by mouth daily.    Cardiac Studies:   Echocardiogram 10/14/2018: Left ventricle cavity is normal in size. Mild concentric hypertrophy of the left ventricle. Normal LV systolic function with EF 66%. Abnormal septal wall motion due to right ventricle pacemaker. Diastolic function not assessed due to paced rhythm. Calculated EF 66%. Left atrial cavity is moderately dilated. Aneurysmal interatrial septum without 2D or color Doppler evidence of interatrial shunt. Bioprosthetic aortic valve with thickening of leaflets. Cannot exclude thrombus. Moderate aortic valve stenosis. Aortic valve mean gradient of 24 mmHg, Vmax of 3.2  m/s. Calculated aortic valve area by continuity equation is 1.1 cm. Trace aortic regurgitation.  Moderate tricuspid regurgitation. Estimated pulmonary artery systolic pressure is 28 mmHg. 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  Carotid artery duplex 05/09/2018  1. Mild amount of plaque at the level of the right carotid bulb and ICA origin. Estimated right ICA stenosis is less than 50%.  2. No evidence of left ICA stenosis. 3. Tortuosity of  bilateral internal carotid arteries consistent with longstanding hypertension.  Coronary angiogram 09/26/2016 Normal coronary arteries. Mild to Moderate pulmonary hypertension. CO 3.99, CI 2.42  Pacemaker implantation 12/21/2016 with Medtronic Azure MRI dual chamber pacemaker using His Bundle Pacing  by Lewayne Bunting, MD  Valve replacement Operative Report 10/12/11: 28 mm AV and MV 21 mm Edwards pericardial valve replacement for severe AI and mod MR by Dr. Laneta Simmers, Laneta Simmers.  Assessment:   Encounter for care of pacemaker  Pacemaker Dual chamber Medtronic Azure MRI  using His bundle pacing  Sinus node dysfunction (HCC)  Stenosis of prosthetic aortic valve  Essential hypertension  Paroxysmal atrial fibrillation (HCC) - CHA2DS2-VASc Score is 4.  Yearly risk of stroke: 4%. (age, female, HTN).    EKG 11/30/2017: A paced rhythm with first-degree AV block at rate of 85 bpm, left axis deviation, left anterior fascicular block, poor R-wave progression, cannot exclude anteroseptal infarct old. LVH.  Remote pacemaker check  7.15.20: There was a <0.1 % cumulative atrial arrhythmia burden. There was 1 high ventricular rate episode detected. EGM shows nsVT for 12 bts. Health trends do not demonstrate significant abnormality. Battery longevity is 10.4 - 11.4 years. RA pacing is 99.4 %, RV pacing is 26.4 %.  Scheduled In office pacemaker check 10/31/2018: There was <1% cumulative atrial arrhythmia burden. Longest AT/AF episode of 17:08:43 on 04/22/2018 with controlled ventricular response.  I HVR episode, EGM suggest 8 beat NSVT. Health trends are normal.  Longevity 10.8 years. AP 100%, VP 6%. Normal pacemaker function.   Recommendations:   Patient is doing well without any complaints today.  She is exercising regularly and she tolerates this very well. No clinical evidence of heart failure.  I discussed recently obtained echocardiogram with the patient, since being on anticoagulation, she has had  improvement in prosthetic valve gradients on anticoagulation, will continue the same.  No bleeding diathesis.  She has had labs  performed in the end of March, CBC has been stable.  She will make an appointment with her PCP in the next few months for follow-up.  She underwent pacemaker interrogation today, showing normal pacemaker function.  Had brief asymptomatic atrial tachycardia and one episode of sustained AF in March 2020. She has been on 2.5 mg BID of Eliquis for sometime; however, in view of A fib, will increase her dose of Eliquis to 5 mg BID. No programming changes were made. No changes were made to her medications today. Blood pressure is well controlled. We will see her back in 6 months for follow-up, but encouraged her to contact us sooner if needed.   *I have discussed this case with Dr. Einar Gip and he participated in formulating the plan.*   Miquel Dunn, MSN, APRN, FNP-C [facility]. [place] Office: [phone] Fax: [phone]

## 2018-11-04 ENCOUNTER — Encounter: Payer: Self-pay | Admitting: Cardiology

## 2018-11-15 MED ORDER — APIXABAN 5 MG PO TABS: 5.0000 mg | ORAL_TABLET | Freq: Two times a day (BID) | ORAL | 2 refills | Status: DC

## 2018-11-28 ENCOUNTER — Encounter: Payer: Self-pay | Admitting: Cardiology

## 2018-12-04 DIAGNOSIS — Z950 Presence of cardiac pacemaker: Secondary | ICD-10-CM

## 2018-12-04 DIAGNOSIS — Z45018 Encounter for adjustment and management of other part of cardiac pacemaker: Secondary | ICD-10-CM | POA: Diagnosis not present

## 2018-12-04 DIAGNOSIS — I495 Sick sinus syndrome: Secondary | ICD-10-CM | POA: Diagnosis not present

## 2018-12-10 ENCOUNTER — Telehealth: Payer: Self-pay

## 2018-12-10 NOTE — Telephone Encounter (Signed)
-----   Message from Adrian Prows, MD sent at 12/08/2018 10:51 AM EDT ----- Regarding: Pacemaker Normal pacemaker function

## 2018-12-10 NOTE — Telephone Encounter (Signed)
Pt aware of transmission  

## 2018-12-25 ENCOUNTER — Other Ambulatory Visit: Payer: Self-pay | Admitting: Cardiology

## 2019-01-08 ENCOUNTER — Other Ambulatory Visit: Payer: Self-pay | Admitting: Cardiovascular Disease

## 2019-01-08 NOTE — Telephone Encounter (Addendum)
Pt has been followed by [facility] (Dr. Irven Shelling) office. Will contact the pharmacy to notify them once they open.  Called the pharmacy and notified her that the pt follows at [facility] and gave them a number to call them for the request. She stated she would send to them and have it processed, will deny refill at this time, Noted the rx was sent on 11/15/2018 by [facility] NP Claiborne Billings.

## 2019-01-31 ENCOUNTER — Ambulatory Visit: Payer: Medicare Other | Admitting: Cardiology

## 2019-03-05 DIAGNOSIS — Z950 Presence of cardiac pacemaker: Secondary | ICD-10-CM | POA: Diagnosis not present

## 2019-03-05 DIAGNOSIS — I495 Sick sinus syndrome: Secondary | ICD-10-CM | POA: Diagnosis not present

## 2019-03-05 DIAGNOSIS — Z45018 Encounter for adjustment and management of other part of cardiac pacemaker: Secondary | ICD-10-CM | POA: Diagnosis not present

## 2019-03-09 ENCOUNTER — Other Ambulatory Visit: Payer: Self-pay | Admitting: Cardiology

## 2019-03-09 DIAGNOSIS — I10 Essential (primary) hypertension: Secondary | ICD-10-CM

## 2019-03-13 ENCOUNTER — Other Ambulatory Visit: Payer: Self-pay | Admitting: Cardiology

## 2019-04-13 ENCOUNTER — Ambulatory Visit: Payer: Medicare Other | Attending: Internal Medicine

## 2019-04-13 ENCOUNTER — Ambulatory Visit: Payer: Medicare Other

## 2019-04-13 DIAGNOSIS — Z23 Encounter for immunization: Secondary | ICD-10-CM | POA: Insufficient documentation

## 2019-04-13 NOTE — Progress Notes (Signed)
   Covid-19 Vaccination Clinic  Name:  JAMAIYA TUNNELL    MRN: [id number] DOB: [birth year]  04/13/2019  Ms. Idler was observed post Covid-19 immunization for 30 minutes based on pre-vaccination screening without incidence. She was provided with Vaccine Information Sheet and instruction to access the V-Safe system.   Ms. Lenger was instructed to call 911 with any severe reactions post vaccine: Marland Kitchen Difficulty breathing  . Swelling of your face and throat  . A fast heartbeat  . A bad rash all over your body  . Dizziness and weakness    Immunizations Administered    Name Date Dose VIS Date Route   Pfizer COVID-19 Vaccine 04/13/2019  3:27 PM 0.3 mL 01/31/2019 Intramuscular   Manufacturer: [employer], [employer]   Lot: [id number]   NDC: [address]-6

## 2019-05-07 ENCOUNTER — Ambulatory Visit: Payer: Medicare Other | Attending: Internal Medicine

## 2019-05-07 DIAGNOSIS — Z23 Encounter for immunization: Secondary | ICD-10-CM

## 2019-05-07 NOTE — Progress Notes (Signed)
   Covid-19 Vaccination Clinic  Name:  [name]    MRN: [id number] DOB: Jun 25, 1940  05/07/2019  [name] was observed post Covid-19 immunization for 15 minutes without incident. She was provided with Vaccine Information Sheet and instruction to access the V-Safe system.   [name] was instructed to call 911 with any severe reactions post vaccine: Marland Kitchen Difficulty breathing  . Swelling of face and throat  . A fast heartbeat  . A bad rash all over body  . Dizziness and weakness   Immunizations Administered    Name Date Dose VIS Date Route   Pfizer COVID-19 Vaccine 05/07/2019  2:05 PM 0.3 mL 01/31/2019 Intramuscular   Manufacturer: [employer], [employer]   Lot: [id number]   NDC: [address]-5

## 2019-05-17 IMAGING — CT CT HEART MORP W/ CTA COR W/ SCORE W/ CA W/CM &/OR W/O CM
3 of 7 series · 9 of 20 positions shown, 10 images · non-contrast
Comparison: None.

EXAM:
OVER-READ INTERPRETATION  CT CHEST

The following report is an over-read performed by radiologist Dr.
over-read does not include interpretation of cardiac or coronary
anatomy or pathology. The cardiac CTA interpretation by the
cardiologist is attached.
CLINICAL DATA: Aortic Stenosis
Cardiac TAVR CT
TECHNIQUE: The patient was scanned on a Siemens Force [REDACTED]ice scanner. A 120
kV retrospective scan was triggered in the ascending thoracic aorta
at 140 HU's. Gantry rotation speed was 250 msecs and collimation was
.6 mm. No beta blockade or nitro were given. The 3D data set was
reconstructed in 5% intervals of the R-R cycle. Systolic and
diastolic phases were analyzed on a dedicated work station using
MPR, MIP and VRT modes. The patient received 80 cc of contrast.

[Series 5: best diast 73 % · axial · 0.35mm/px · z∈[+954,+1027]mm · 2 of 546 slices shown]
[im 182/546  vessel]
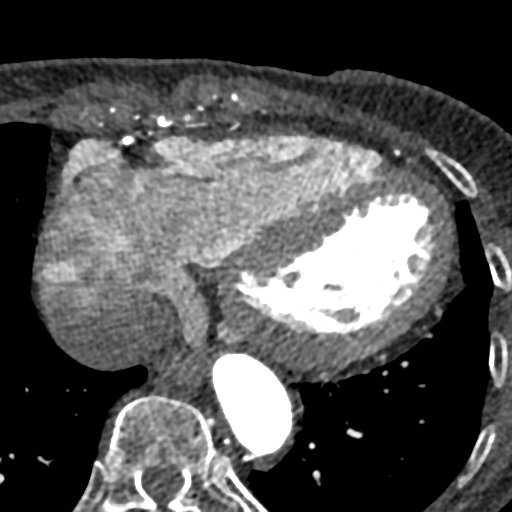
[im 364/546  vessel]
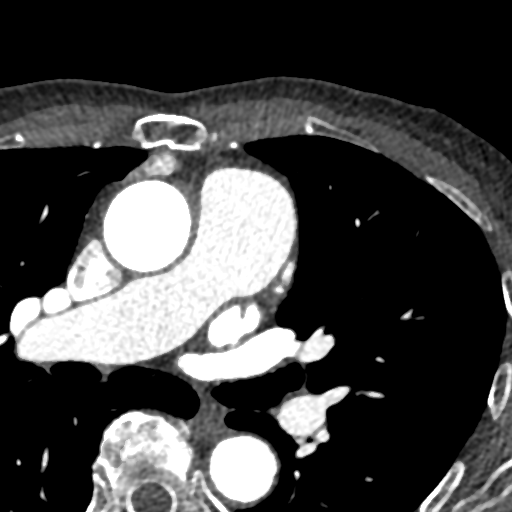

[Series 6: ax thins · axial · 0.80mm/px · z∈[+700,+1056]mm · 4 of 597 slices shown, 5 images]
[im 120/597  vessel]
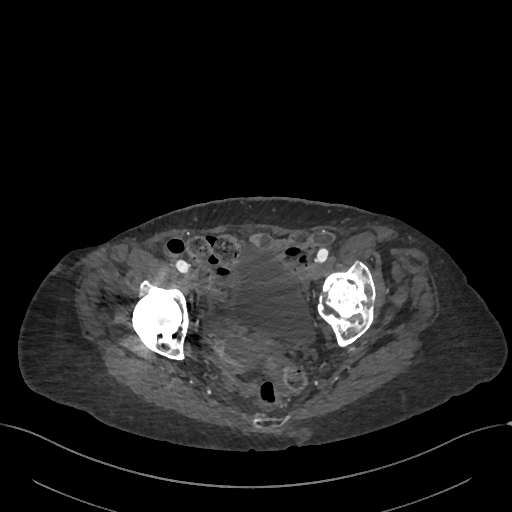
[im 120/597  lung]
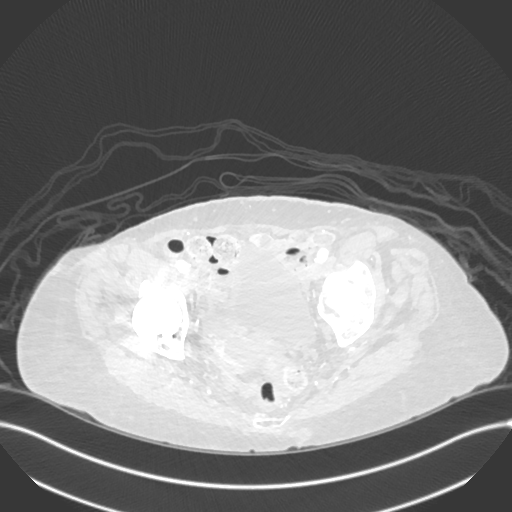
[im 239/597  vessel]
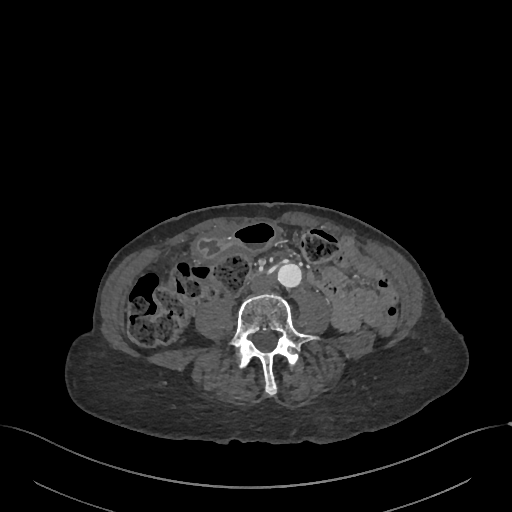
[im 358/597  vessel]
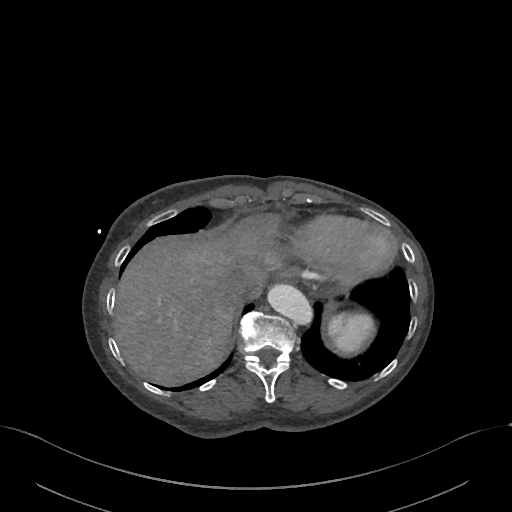
[im 477/597  vessel]
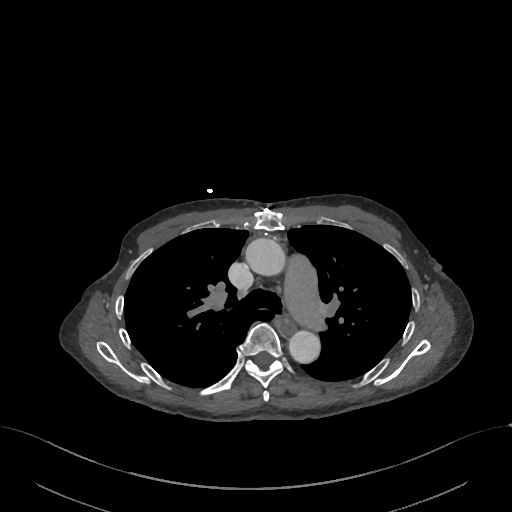

[Series 7: best syst 32 % · axial · 0.35mm/px · z∈[+936,+1045]mm · 3 of 546 slices shown]
[im 137/546  vessel]
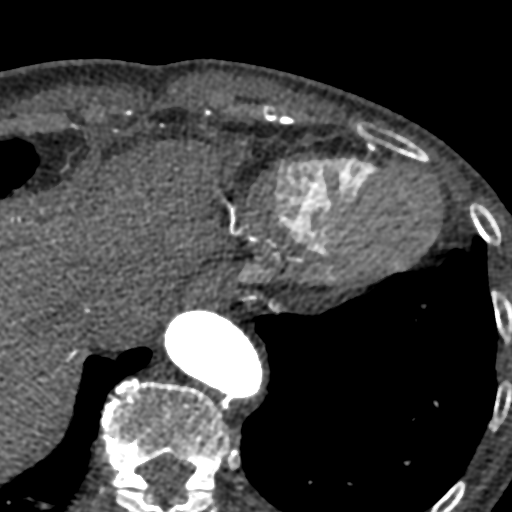
[im 273/546  vessel]
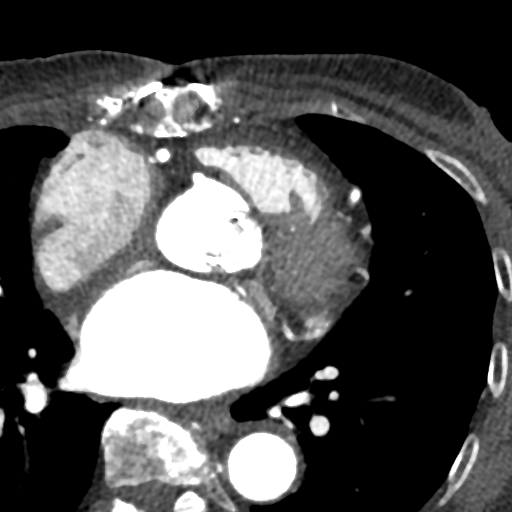
[im 409/546  vessel]
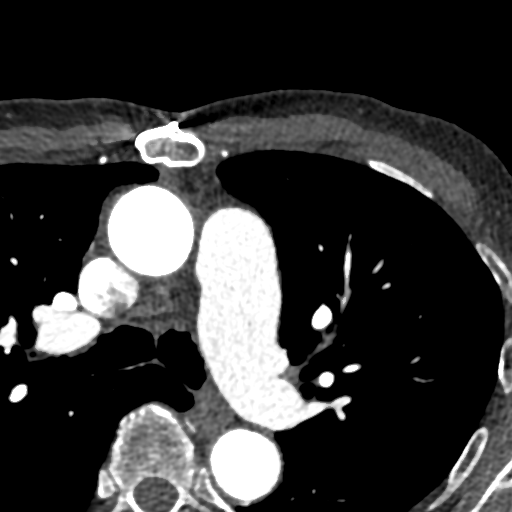

[9 of 20 positions shown; findings below may reference images not displayed]

FINDINGS: Extracardiac findings will be described under separate dictation for
contemporaneously obtained CTA of the chest, abdomen and pelvis.
IMPRESSION: Please see separate dictation for contemporaneously obtained CTA of
the chest, abdomen and pelvis dated 11/09/2016 for full description
of relevant extracardiac findings.
FINDINGS: Aortic Valve: The is a 21 mm Xoruz stented bovine pericardial
Magna Ease bioprosthetic valve There is no stent creep with post to
post diameter about 18 mm. The valve appears small compared to the
coronary sinuses The leaflets only thicken near the base. There is
no appreciable calcification. There does appear to be commissural
fusion near the base especially between the right leaflet. The base
of the leaflets are thickened and hypodense but this more likely
represents artifact from the stents as opposed to subclinical
thrombus. There is no appreciable pannus seen.

Aorta:  27.5 mm

Magna [place]:  26 mm

Ascending Thoracic Aorta:  27.5 mm

Aortic Arch:  25 mm

Descending Thoracic Aorta:  Tortuous 23 mm

Sinus of Valsalva Measurements:

Non-coronary:  30.9 mm

Right - coronary:  29.8 mm

Left -   coronary:  31.5 mm

Coronary Artery Height above Annulus:

Left Main:  11.3 mm above annulus

Right Coronary:  16 mm above annulus

Virtual Basal Annulus Measurements:

Maximum / Minimum Diameter: Inner diameter of bioprosthetic valve 21
mm

Perimeter:  68.9 mm

Area:  377 mm2
IMPRESSION: 1) 21 mm Magna Ease bioprosthetic valve. Planimetered area 1.5 cm2.
Some thickening of the basal leaflets and commissural fusion but no
obvious subclinical thrombus or pannus

2) Suitable coronary height for valve in valve procedure

3) Normal Internal Diameter ID 21 mm for this valve

4) Normal aortic root 27.5 mm

5) Relatively large coronary sinuses for implanted valve

Tonisha Cleland

## 2019-06-09 NOTE — Progress Notes (Signed)
Primary Physician:  Vernie Shanks, MD   Patient ID: [name], female    DOB: 07-Jul-1940, 78 y.o.   MRN: [id number]  Subjective:    Chief Complaint  Patient presents with  . Follow-up    6 month    HPI: [name]  is a 79 y.o. female female  with prior history of severe aortic regurgitation and moderately severe mitral regurgitation. She underwent aortic pericardial tissue valve replacement and mitral valve ring placement on 08/12/2011 with bioprosthetic valves by Dr. Gilford Raid. Since then her ejection fraction is normalized.    She also has sick sinus syndrome and complete heart block needing permanent pacemaker implantation on 12/21/2016 with Medtronic dual-chamber pacemaker. She has had one episode of sustained A. Fib during office pacemaker check on 10/31/18. Initially felt to have severe aortic stenosis due to degenerative aortic valve however there was a mismatch between echo and cardiac catheterization findings and CT scan.  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 twice daily, now increased to 5 mg BID due to PAF. She now presents here for 84-monthoffice visit.  She is presently doing well and denies chest pain or dyspnea, dizziness or syncope.  No bleeding diathesis on Eliquis.  Past Medical History:  Diagnosis Date  . Aortic stenosis    2013  . Arthritis    "hips" (12/21/2016)  . Bioprosthetic 28 mm AV and 21 mm MV 10/11/2016  . Chronic diastolic CHF (congestive heart failure) ([place]   . Chronic kidney disease   . Complication of anesthesia    after hip surgery 06-2016 bp and heart rate dropped pt. has had a pacemaker placed since this event.  . Encounter for care of pacemaker 09/07/2018  . H/O atrial fibrillation without current medication 06/06/2018   2013 post AV and MV replacement and no recurrence. On Eliquis for AV thrombotic  degeneration  . History of blood transfusion 05/2016   "when I had my hip replaced" (12/21/2016)  . Hypertension   . Paroxysmal atrial fibrillation ([facility] 06/06/2018   2013 post AV and MV replacement and no recurrence. On Eliquis for AV thrombotic degeneration  . Presence of permanent cardiac pacemaker    medtronic  . Shortness of breath    hx dyspnea and respiratory abnormalities  . Sinus node dysfunction ([place] 12/21/2016    Past Surgical History:  Procedure Laterality Date  . AORTIC VALVE REPLACEMENT  10/12/2011   Procedure: AORTIC VALVE REPLACEMENT (AVR);  Surgeon: BGaye Pollack MD;  [place]  Service: Open Heart Surgery;  Laterality: N/A;  . CARDIAC CATHETERIZATION     "I've had several; no OR" (12/21/2016)  . CARDIAC VALVE REPLACEMENT    . CARDIOVERSION  12/26/2011   Procedure: CARDIOVERSION;  Surgeon: JLaverda Page MD;  [place] [facility]  Service: Cardiovascular;  Laterality: N/A;  . CORONARY ANGIOGRAPHY N/A 09/26/2016   Procedure: CORONARY ANGIOGRAPHY;  Surgeon: PNigel Mormon MD;  [place] [facility];  Service: Cardiovascular;  Laterality: N/A;  . EXPLORATION POST OPERATIVE OPEN HEART  10/12/2011   Procedure: EXPLORATION POST OPERATIVE OPEN HEART;  Surgeon: BGaye Pollack MD;  [place] [facility];  Service: Open Heart Surgery;  Laterality: N/A;  . INSERT / REPLACE / REMOVE PACEMAKER  12/21/2016  . JOINT REPLACEMENT     right hip 06-2016 Left hip 02-08-17 Dr. SLyla Glassing . LEFT AND  RIGHT HEART CATHETERIZATION WITH CORONARY ANGIOGRAM N/A 09/11/2011   Procedure: LEFT AND RIGHT HEART CATHETERIZATION WITH CORONARY ANGIOGRAM;  Surgeon: Laverda Page, MD;  Location: [facility] CATH LAB;  Service: Cardiovascular;  Laterality: N/A;  . MITRAL VALVE REPAIR  10/12/2011   Procedure: MITRAL VALVE REPAIR (MVR);  Surgeon: Gaye Pollack, MD;  Location: [place];  Service: Open Heart Surgery;  Laterality: N/A;  mitral valve annuloplasty  . PACEMAKER IMPLANT N/A 12/21/2016    Procedure: PACEMAKER IMPLANT;  Surgeon: Evans Lance, MD;  Location: [place] [facility] LAB;  Service: Cardiovascular;  Laterality: N/A;  . RIGHT HEART CATH N/A 09/26/2016   Procedure: RIGHT HEART CATH;  Surgeon: Nigel Mormon, MD;  Location: [place] [facility];  Service: Cardiovascular;  Laterality: N/A;  . TEE WITHOUT CARDIOVERSION N/A 08/22/2016   Procedure: TRANSESOPHAGEAL ECHOCARDIOGRAM (TEE);  Surgeon: Adrian Prows, MD;  Location: [facility] ENDOSCOPY;  Service: Cardiovascular;  Laterality: N/A;  . TEE WITHOUT CARDIOVERSION N/A 12/12/2016   Procedure: TRANSESOPHAGEAL ECHOCARDIOGRAM (TEE);  Surgeon: Dorothy Spark, MD;  Location: [facility] ENDOSCOPY;  Service: Cardiovascular;  Laterality: N/A;  . TOTAL HIP ARTHROPLASTY Right 06/15/2016   Procedure: RIGHT TOTAL HIP ARTHROPLASTY ANTERIOR APPROACH;  Surgeon: Rod Can, MD;  Location: [facility];  Service: Orthopedics;  Laterality: Right;  Dr. needs RNFA  . TOTAL HIP ARTHROPLASTY Left 02/08/2017   Procedure: LEFT TOTAL HIP ARTHROPLASTY ANTERIOR APPROACH;  Surgeon: Rod Can, MD;  Location: [facility];  Service: Orthopedics;  Laterality: Left;  Needs RNFA   Social History   Tobacco Use  . Smoking status: Never Smoker  . Smokeless tobacco: Never Used  Substance Use Topics  . Alcohol use: No   Marital Status: Single   Review of Systems  Cardiovascular: Negative for chest pain, dyspnea on exertion and leg swelling.  Gastrointestinal: Negative for melena.   Objective:  Blood pressure 125/80, pulse 78, temperature (!) 95.7 F (35.4 C), temperature source Temporal, resp. rate 15, height '5\' 4"'  (1.626 m), weight 156 lb (70.8 kg), SpO2 98 %. Body mass index is 26.78 kg/m.    Physical Exam  Constitutional: She appears well-developed and well-nourished. No distress.  Eyes: Conjunctivae are normal.  Cardiovascular: Normal rate, regular rhythm, S1 normal, S2 normal, intact distal pulses and normal pulses. Exam reveals no gallop.  Murmur heard.  Harsh  mid to late systolic murmur is present with a grade of 2/6 at the upper right sternal border radiating to the neck. Pulses:      Carotid pulses are on the right side with bruit and on the left side with bruit. No JVD. No pedal edema  Pulmonary/Chest: Effort normal and breath sounds normal.  Abdominal: Soft. Bowel sounds are normal.  Musculoskeletal:     Cervical back: Neck supple.   Radiology: No results found.  Laboratory examination:    CMP Latest Ref Rng & Units 05/10/2018 05/09/2018 02/09/2017  Glucose 70 - 99 mg/dL 92 136(H) 105(H)  BUN 8 - 23 mg/dL 25(H) 25(H) 20  Creatinine 0.44 - 1.00 mg/dL 0.99 1.05(H) 1.18(H)  Sodium 135 - 145 mmol/L 141 138 136  Potassium 3.5 - 5.1 mmol/L 4.4 4.4 4.8  Chloride 98 - 111 mmol/L 113(H) 108 106  CO2 22 - 32 mmol/L '24 24 26  ' Calcium 8.9 - 10.3 mg/dL 9.0 9.3 9.0  Total Protein 6.5 - 8.1 g/dL - 7.2 -  Total Bilirubin 0.3 - 1.2 mg/dL - 0.5 -  Alkaline Phos 38 - 126 U/L - 46 -  AST 15 -  41 U/L - 18 -  ALT 0 - 44 U/L - 13 -   CBC Latest Ref Rng & Units 05/09/2018 04/18/2017 02/10/2017  WBC 4.0 - 10.5 K/uL 5.1 6.3 10.6(H)  Hemoglobin 12.0 - 15.0 g/dL 13.9 12.2 10.5(L)  Hematocrit 36.0 - 46.0 % 44.3 36.2 30.8(L)  Platelets 150 - 400 K/uL 97(L) 159 96(L)   Lipid Panel     Component Value Date/Time   CHOL 141 08/22/2011 0635   TRIG 72 08/22/2011 0635   HDL 67 08/22/2011 0635   CHOLHDL 2.1 08/22/2011 0635   VLDL 14 08/22/2011 0635   LDLCALC 60 08/22/2011 0635   HEMOGLOBIN A1C Lab Results  Component Value Date   HGBA1C 6.3 (H) 10/10/2011   MPG 134 (H) 10/10/2011   TSH No results for input(s): TSH in the last 8760 hours.  PRN Meds:. There are no discontinued medications. Current Meds  Medication Sig  . alendronate (FOSAMAX) 70 MG tablet Take 70 mg by mouth once a week.  Marland Kitchen ELIQUIS 5 MG TABS tablet TAKE 1 TABLET BY MOUTH  TWICE DAILY  . metoprolol succinate (TOPROL-XL) 100 MG 24 hr tablet TAKE 1 TABLET(100 MG) BY MOUTH DAILY  .  Multiple Vitamin (MULTIVITAMIN WITH MINERALS) TABS tablet Take 1 tablet by mouth daily.  Marland Kitchen spironolactone (ALDACTONE) 25 MG tablet TAKE 1 TABLET BY MOUTH IN  THE MORNING  . Vitamin D, Ergocalciferol, (DRISDOL) 1.25 MG (50000 UT) CAPS capsule Take 50,000 Units by mouth daily.   External labs:  Cholesterol, total 159.000 11/09/2017 HDL 64.000 11/09/2017 LDL 82.000 11/09/2017 Triglycerides 68.000 11/09/2017 A1C 6.100 11/09/2017  Cardiac Studies:   Valve replacement Operative Report 10/12/11: 28 mm AV and MV 21 mm Edwards pericardial valve replacement for severe AI and mod MR by Dr. Cyndia Bent, Cyndia Bent.  Coronary angiogram 09/26/2016 Normal coronary arteries. Mild to Moderate pulmonary hypertension. CO 3.99, CI 2.42  Pacemaker implantation 12/21/2016 with Medtronic Azure MRI dual chamber pacemaker using His Bundle Pacing  by Cristopher Peru, MD  Carotid artery duplex 05/09/2018  1. Mild amount of plaque at the level of the right carotid bulb and ICA origin. Estimated right ICA stenosis is less than 50%.  2. No evidence of left ICA stenosis. 3. Tortuosity of bilateral internal carotid arteries consistent with longstanding hypertension.  Echocardiogram 10/14/2018: Left ventricle cavity is normal in size. Mild concentric hypertrophy of the left ventricle. Normal LV systolic function with EF 66%. Abnormal septal wall motion due to right ventricle pacemaker. Diastolic function not assessed due to paced rhythm. Calculated EF 66%. Left atrial cavity is moderately dilated. Aneurysmal interatrial septum without 2D or color Doppler evidence of interatrial shunt. Bioprosthetic aortic valve with thickening of leaflets. Cannot exclude thrombus. Moderate aortic valve stenosis. Aortic valve mean gradient of 24 mmHg, Vmax of 3.2  m/s. Calculated aortic valve area by continuity equation is 1.1 cm. Trace aortic regurgitation.  Moderate tricuspid regurgitation. Estimated pulmonary artery systolic pressure is 28  mmHg. 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  Scheduled Remote pacemaker check 06/05/2019:    There were 0 atrial high rate episodes detected. There were 2 high ventricular rate episodes detected. Review of EGMs demonstrates NSVT lasting 8 - 9 beats in duration with CL range of 460 - 310 ms. Health trends do not demonstrate significant abnormality. Battery longevity is 10.2 years. RA pacing is 98.4 %, RV pacing is 27.4 %.  Scheduled In office pacemaker check 10/31/2018: There was <1% cumulative atrial arrhythmia burden. Longest AT/AF episode  of 17:08:43 on 04/22/2018 with controlled ventricular response.  I HVR episode, EGM suggest 8 beat NSVT. Health trends are normal.  Longevity 10.8 years. AP 100%, VP 6%. Normal pacemaker function.  EKG:  EKG 06/10/2019: Atrially paced rhythm with prolonged first-degree AV block at rate of 76 bpm, PR interval of 100 ms.  Left axis deviation, left anterior fascicular block.  IVCD, LVH with repolarization abnormality, cannot exclude high lateral ischemia.  No significant change from EKG 11/30/2017  Assessment:     ICD-10-CM   1. Bioprosthetic 28 mm AV and 21 mm MV 10/12/11  Z95.2 EKG 12-Lead  2. Sinus node dysfunction (HCC)  I49.5   3. Paroxysmal atrial fibrillation ([place]). CHA2DS2-VASc Score is 4.  Yearly risk of stroke: 4% (A, F, HTN).     I48.0 CBC    CMP14+EGFR    TSH  4. Pacemaker Dual chamber Medtronic Azure MRI  using His bundle pacing  Z95.0   5. Essential hypertension  I10     Recommendations:   [name]  is a 79 y.o. female  with prior history of severe aortic regurgitation and moderately severe mitral regurgitation. She underwent aortic pericardial tissue valve replacement and mitral valve ring placement on 08/12/2011 with bioprosthetic valves by Dr. Gilford Raid. Since then her ejection fraction is normalized.    She also has sick sinus syndrome and complete heart block needing  permanent pacemaker implantation on 12/21/2016 with Medtronic dual-chamber pacemaker. She has had one episode of sustained A. Fib during office pacemaker check on 10/31/18. 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  She now presents here for 28-monthoffice visit.  I have reviewed the results of the recent pacemaker transmission, normal function, no recurrence of atrial fibrillation.  As she remains asymptomatic I did not repeat an echocardiogram.  No significant change in aortic valve murmur and probably much softer than before.  She has not had any labs in the past one year due to COVID-19, CBC, CMP and TSH ordered.  She does not have hyperlipidemia.  No vascular disease.  I will see her back in 6 months.  JAdrian Prows MD, FEvangelical Community [facility] Center4/20/2021, 3:43 PM PStarrCardiovascular. PFranklinOffice: [phone]

## 2019-06-10 ENCOUNTER — Other Ambulatory Visit: Payer: Self-pay

## 2019-06-10 ENCOUNTER — Encounter: Payer: Self-pay | Admitting: Cardiology

## 2019-06-10 ENCOUNTER — Ambulatory Visit: Payer: Medicare Other | Admitting: Cardiology

## 2019-06-10 VITALS — BP 125/80 | HR 78 | Temp 95.7°F | Resp 15 | Ht 64.0 in | Wt 156.0 lb

## 2019-06-10 DIAGNOSIS — I480 Paroxysmal atrial fibrillation: Secondary | ICD-10-CM

## 2019-06-10 DIAGNOSIS — Z952 Presence of prosthetic heart valve: Secondary | ICD-10-CM

## 2019-06-10 DIAGNOSIS — I10 Essential (primary) hypertension: Secondary | ICD-10-CM

## 2019-06-10 DIAGNOSIS — Z950 Presence of cardiac pacemaker: Secondary | ICD-10-CM

## 2019-06-10 DIAGNOSIS — I495 Sick sinus syndrome: Secondary | ICD-10-CM

## 2019-06-20 LAB — CMP14+EGFR
ALT: 9 IU/L (ref 0–32)
AST: 16 IU/L (ref 0–40)
Albumin/Globulin Ratio: 1.3 (ref 1.2–2.2)
Albumin: 4.3 g/dL (ref 3.7–4.7)
Alkaline Phosphatase: 50 IU/L (ref 39–117)
BUN/Creatinine Ratio: 17 (ref 12–28)
BUN: 30 mg/dL — ABNORMAL HIGH (ref 8–27)
Bilirubin Total: 0.4 mg/dL (ref 0.0–1.2)
CO2: 24 mmol/L (ref 20–29)
Calcium: 10.5 mg/dL — ABNORMAL HIGH (ref 8.7–10.3)
Chloride: 103 mmol/L (ref 96–106)
Creatinine, Ser: 1.77 mg/dL — ABNORMAL HIGH (ref 0.57–1.00)
GFR calc Af Amer: 31 mL/min/{1.73_m2} — ABNORMAL LOW (ref >59)
GFR calc non Af Amer: 27 mL/min/{1.73_m2} — ABNORMAL LOW (ref >59)
Globulin, Total: 3.4 g/dL (ref 1.5–4.5)
Glucose: 91 mg/dL (ref 65–99)
Potassium: 4.9 mmol/L (ref 3.5–5.2)
Sodium: 139 mmol/L (ref 134–144)
Total Protein: 7.7 g/dL (ref 6.0–8.5)

## 2019-06-20 LAB — CBC
Hematocrit: 39 % (ref 34.0–46.6)
Hemoglobin: 12.7 g/dL (ref 11.1–15.9)
MCH: 32.3 pg (ref 26.6–33.0)
MCHC: 32.6 g/dL (ref 31.5–35.7)
MCV: 99 fL — ABNORMAL HIGH (ref 79–97)
Platelets: 144 10*3/uL — ABNORMAL LOW (ref 150–450)
RBC: 3.93 x10E6/uL (ref 3.77–5.28)
RDW: 12.6 % (ref 11.7–15.4)
WBC: 5.6 10*3/uL (ref 3.4–10.8)

## 2019-06-20 LAB — TSH: TSH: 0.726 u[IU]/mL (ref 0.450–4.500)

## 2019-06-20 NOTE — Progress Notes (Addendum)
Please let her know to stop Spironolactone. Her kidneys are not working well. Will recheck  BMP. In 2-3 weeks and I have put orders. Acute renal insufficiency with S. Cr 1.6

## 2019-06-20 NOTE — Addendum Note (Signed)
Addended by: Delrae Rend on: 06/20/2019 05:59 PM   Modules accepted: Orders

## 2019-06-23 ENCOUNTER — Telehealth: Payer: Self-pay

## 2019-06-23 NOTE — Telephone Encounter (Signed)
-----   Message from Yates Decamp, MD sent at 06/20/2019  5:57 PM EDT ----- Please let her know to stop Spironolactone. Her kidneys are not working well. Will recheck  BMP. In 2-3 weeks and I have put orders.  JG

## 2019-06-23 NOTE — Telephone Encounter (Signed)
Spoke with [name] and gave Dr. Verl Dicker instructions concerning Spironolactone and rechecking labs. She verbalized understanding.

## 2019-07-07 ENCOUNTER — Other Ambulatory Visit: Payer: Self-pay | Admitting: Cardiovascular Disease

## 2019-07-11 LAB — BASIC METABOLIC PANEL
BUN/Creatinine Ratio: 13 (ref 12–28)
BUN: 20 mg/dL (ref 8–27)
CO2: 22 mmol/L (ref 20–29)
Calcium: 10.6 mg/dL — ABNORMAL HIGH (ref 8.7–10.3)
Chloride: 103 mmol/L (ref 96–106)
Creatinine, Ser: 1.56 mg/dL — ABNORMAL HIGH (ref 0.57–1.00)
GFR calc Af Amer: 36 mL/min/{1.73_m2} — ABNORMAL LOW (ref >59)
GFR calc non Af Amer: 31 mL/min/{1.73_m2} — ABNORMAL LOW (ref >59)
Glucose: 103 mg/dL — ABNORMAL HIGH (ref 65–99)
Potassium: 5 mmol/L (ref 3.5–5.2)
Sodium: 140 mmol/L (ref 134–144)

## 2019-07-12 ENCOUNTER — Other Ambulatory Visit: Payer: Self-pay | Admitting: Cardiology

## 2019-07-12 DIAGNOSIS — I10 Essential (primary) hypertension: Secondary | ICD-10-CM

## 2019-07-12 NOTE — Progress Notes (Signed)
Has she stopped Spironolactone.? Renal function is still low. Will check BMP in 1 month and arrange OV with me please I am putting orders for labs

## 2019-07-15 NOTE — Progress Notes (Signed)
Yes, patient has stopped the Spironolactone and is aware to repeat BMP in 1 month and has scheduled appt to see you right after.

## 2019-07-15 NOTE — Progress Notes (Signed)
Yes, patient has stopped the Spironolactone and is aware to repeat BMP in 1 month and has scheduled appt to see you right after.

## 2019-08-15 ENCOUNTER — Ambulatory Visit: Payer: Medicare Other | Admitting: Cardiology

## 2019-09-04 LAB — BASIC METABOLIC PANEL
BUN/Creatinine Ratio: 9 — ABNORMAL LOW (ref 12–28)
BUN: 12 mg/dL (ref 8–27)
CO2: 21 mmol/L (ref 20–29)
Calcium: 10.1 mg/dL (ref 8.7–10.3)
Chloride: 105 mmol/L (ref 96–106)
Creatinine, Ser: 1.4 mg/dL — ABNORMAL HIGH (ref 0.57–1.00)
GFR calc Af Amer: 41 mL/min/{1.73_m2} — ABNORMAL LOW (ref >59)
GFR calc non Af Amer: 36 mL/min/{1.73_m2} — ABNORMAL LOW (ref >59)
Glucose: 85 mg/dL (ref 65–99)
Potassium: 4.6 mmol/L (ref 3.5–5.2)
Sodium: 142 mmol/L (ref 134–144)

## 2019-09-08 ENCOUNTER — Ambulatory Visit: Payer: Medicare Other | Admitting: Cardiology

## 2019-09-08 ENCOUNTER — Other Ambulatory Visit: Payer: Self-pay

## 2019-09-08 ENCOUNTER — Encounter: Payer: Self-pay | Admitting: Cardiology

## 2019-09-08 VITALS — BP 138/88 | HR 78 | Resp 16 | Ht 64.0 in | Wt 163.0 lb

## 2019-09-08 DIAGNOSIS — N1831 Chronic kidney disease, stage 3a: Secondary | ICD-10-CM

## 2019-09-08 DIAGNOSIS — I10 Essential (primary) hypertension: Secondary | ICD-10-CM

## 2019-09-08 DIAGNOSIS — I480 Paroxysmal atrial fibrillation: Secondary | ICD-10-CM

## 2019-09-08 DIAGNOSIS — Z950 Presence of cardiac pacemaker: Secondary | ICD-10-CM

## 2019-09-08 MED ORDER — METOPROLOL SUCCINATE ER 200 MG PO TB24: 200.0000 mg | ORAL_TABLET | Freq: Every day | ORAL | 3 refills | Status: DC

## 2019-09-08 NOTE — Progress Notes (Signed)
Primary Physician:  Vernie Shanks, MD   Patient ID: [name], female    DOB: [birth year], 79 y.o.   MRN: [id number]  Subjective:    Chief Complaint  Patient presents with   Results    Labs   Follow-up    1 month    HPI: [name]  is a 79 y.o. female female  with prior history of severe aortic regurgitation and moderately severe mitral regurgitation. She underwent aortic pericardial tissue valve replacement and mitral valve ring placement on 08/12/2011 with bioprosthetic valves by Dr. Gilford Raid. Since then her ejection fraction is normalized.    She also has sick sinus syndrome and complete heart block needing permanent pacemaker implantation on 12/21/2016 with Medtronic dual-chamber pacemaker. She has had one episode of sustained A. Fib during office pacemaker check on 10/31/18. Initially felt to have severe aortic stenosis due to degenerative aortic valve however there was a mismatch between echo and cardiac catheterization findings and CT scan.  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 twice daily, now increased to 5 mg BID due to PAF.   This is a 13-monthoffice visit, on her last office visit labs reviewed new development of acute renal insufficiency and hence I have discontinued spironolactone.  I had started her on amlodipine for hypertension.  She is asymptomatic without chest pain or shortness of breath, no leg edema.  Has been having back pain with amlodipine  Past Medical History:  Diagnosis Date   Aortic stenosis    2013   Arthritis    "hips" (12/21/2016)   Bioprosthetic 28 mm AV and 21 mm MV 10/11/2016   Chronic diastolic CHF (congestive heart failure) (HCC)    Chronic kidney disease    Complication of anesthesia    after hip surgery 06-2016 bp and heart rate dropped pt. has had a pacemaker placed since this event.    Encounter for care of pacemaker 09/07/2018   H/O atrial fibrillation without current medication 06/06/2018   2013 post AV and MV replacement and no recurrence. On Eliquis for AV thrombotic degeneration   History of blood transfusion 05/2016   "when I had my hip replaced" (12/21/2016)   Hypertension    Paroxysmal atrial fibrillation ([place] 06/06/2018   2013 post AV and MV replacement and no recurrence. On Eliquis for AV thrombotic degeneration   Presence of permanent cardiac pacemaker    medtronic   Shortness of breath    hx dyspnea and respiratory abnormalities   Sinus node dysfunction ([place] 12/21/2016    Past Surgical History:  Procedure Laterality Date   AORTIC VALVE REPLACEMENT  10/12/2011   Procedure: AORTIC VALVE REPLACEMENT (AVR);  Surgeon: BGaye Pollack MD;  [place]  Service: Open Heart Surgery;  Laterality: N/A;   CARDIAC CATHETERIZATION     "I've had several; no OR" (12/21/2016)   CARDIAC VALVE REPLACEMENT     CARDIOVERSION  12/26/2011   Procedure: CARDIOVERSION;  Surgeon: JLaverda Page MD;  [place]  Service: Cardiovascular;  Laterality: N/A;   CORONARY ANGIOGRAPHY N/A 09/26/2016   Procedure: CORONARY ANGIOGRAPHY;  Surgeon: PNigel Mormon MD;  [place] [facility];  Service: Cardiovascular;  Laterality: N/A;   EXPLORATION POST OPERATIVE OPEN HEART  10/12/2011   Procedure: EXPLORATION POST OPERATIVE OPEN HEART;  Surgeon: BGaye Pollack MD;  [place] [facility]  Service:  Open Heart Surgery;  Laterality: N/A;   INSERT / REPLACE / REMOVE PACEMAKER  12/21/2016   JOINT REPLACEMENT     right hip 06-2016 Left hip 02-08-17 Dr. Lyla Glassing   LEFT AND RIGHT HEART CATHETERIZATION WITH CORONARY ANGIOGRAM N/A 09/11/2011   Procedure: LEFT AND RIGHT HEART CATHETERIZATION WITH CORONARY ANGIOGRAM;  Surgeon: Laverda Page, MD;  Location: [facility] CATH LAB;  Service: Cardiovascular;  Laterality: N/A;   MITRAL VALVE REPAIR  10/12/2011   Procedure:  MITRAL VALVE REPAIR (MVR);  Surgeon: Gaye Pollack, MD;  Location: [place];  Service: Open Heart Surgery;  Laterality: N/A;  mitral valve annuloplasty   PACEMAKER IMPLANT N/A 12/21/2016   Procedure: PACEMAKER IMPLANT;  Surgeon: Evans Lance, MD;  Location: [place] [facility];  Service: Cardiovascular;  Laterality: N/A;   RIGHT HEART CATH N/A 09/26/2016   Procedure: RIGHT HEART CATH;  Surgeon: Nigel Mormon, MD;  Location: [place] [facility];  Service: Cardiovascular;  Laterality: N/A;   TEE WITHOUT CARDIOVERSION N/A 08/22/2016   Procedure: TRANSESOPHAGEAL ECHOCARDIOGRAM (TEE);  Surgeon: Adrian Prows, MD;  Location: [facility];  Service: Cardiovascular;  Laterality: N/A;   TEE WITHOUT CARDIOVERSION N/A 12/12/2016   Procedure: TRANSESOPHAGEAL ECHOCARDIOGRAM (TEE);  Surgeon: Dorothy Spark, MD;  Location: [facility] ENDOSCOPY;  Service: Cardiovascular;  Laterality: N/A;   TOTAL HIP ARTHROPLASTY Right 06/15/2016   Procedure: RIGHT TOTAL HIP ARTHROPLASTY ANTERIOR APPROACH;  Surgeon: Rod Can, MD;  Location: [facility];  Service: Orthopedics;  Laterality: Right;  Dr. needs RNFA   TOTAL HIP ARTHROPLASTY Left 02/08/2017   Procedure: LEFT TOTAL HIP ARTHROPLASTY ANTERIOR APPROACH;  Surgeon: Rod Can, MD;  Location: [facility];  Service: Orthopedics;  Laterality: Left;  Needs RNFA   Social History   Tobacco Use   Smoking status: Never Smoker   Smokeless tobacco: Never Used  Substance Use Topics   Alcohol use: No   Marital Status: Single   Review of Systems  Cardiovascular: Negative for chest pain, dyspnea on exertion and leg swelling.  Gastrointestinal: Negative for melena.   Objective:  Blood pressure 138/88, pulse 78, resp. rate 16, height '5\' 4"'  (1.626 m), weight 163 lb (73.9 kg), SpO2 98 %. Body mass index is 27.98 kg/m.   Vitals with BMI 09/08/2019 09/08/2019 06/10/2019  Height - '5\' 4"'  '5\' 4"'   Weight - 163 lbs 156 lbs  BMI - 73.41 93.79  Systolic 024 097 353  Diastolic 88 90 80    Pulse - 78 78      Physical Exam Constitutional:      General: She is not in acute distress.    Appearance: She is well-developed.  Eyes:     Conjunctiva/sclera: Conjunctivae normal.  Cardiovascular:     Rate and Rhythm: Normal rate and regular rhythm.     Pulses: Normal pulses and intact distal pulses.          Carotid pulses are on the right side with bruit and on the left side with bruit.    Heart sounds: S1 normal and S2 normal. Murmur heard.  Harsh mid to late systolic murmur is present with a grade of 2/6 at the upper right sternal border radiating to the neck.  No gallop.      Comments: No JVD. No pedal edema Pulmonary:     Effort: Pulmonary effort is normal.     Breath sounds: Normal breath sounds.  Abdominal:     General: Bowel sounds are normal.     Palpations: Abdomen is soft.  Musculoskeletal:     Cervical back: Neck supple.    Radiology: No results found.  Laboratory examination:    CMP Latest Ref Rng & Units 09/03/2019 07/10/2019 06/19/2019  Glucose 65 - 99 mg/dL 85 103(H) 91  BUN 8 - 27 mg/dL 12 20 30(H)  Creatinine 0.57 - 1.00 mg/dL 1.40(H) 1.56(H) 1.77(H)  Sodium 134 - 144 mmol/L 142 140 139  Potassium 3.5 - 5.2 mmol/L 4.6 5.0 4.9  Chloride 96 - 106 mmol/L 105 103 103  CO2 20 - 29 mmol/L '21 22 24  ' Calcium 8.7 - 10.3 mg/dL 10.1 10.6(H) 10.5(H)  Total Protein 6.0 - 8.5 g/dL - - 7.7  Total Bilirubin 0.0 - 1.2 mg/dL - - 0.4  Alkaline Phos 39 - 117 IU/L - - 50  AST 0 - 40 IU/L - - 16  ALT 0 - 32 IU/L - - 9   CBC Latest Ref Rng & Units 06/19/2019 05/09/2018 04/18/2017  WBC 3.4 - 10.8 x10E3/uL 5.6 5.1 6.3  Hemoglobin 11.1 - 15.9 g/dL 12.7 13.9 12.2  Hematocrit 34.0 - 46.6 % 39.0 44.3 36.2  Platelets 150 - 450 x10E3/uL 144(L) 97(L) 159   Lipid Panel     Component Value Date/Time   CHOL 141 08/22/2011 0635   TRIG 72 08/22/2011 0635   HDL 67 08/22/2011 0635   CHOLHDL 2.1 08/22/2011 0635   VLDL 14 08/22/2011 0635   LDLCALC 60 08/22/2011 0635    HEMOGLOBIN A1C Lab Results  Component Value Date   HGBA1C 6.3 (H) 10/10/2011   MPG 134 (H) 10/10/2011   TSH Recent Labs    06/19/19 1331  TSH 0.726    PRN Meds:. Medications Discontinued During This Encounter  Medication Reason   spironolactone (ALDACTONE) 25 MG tablet Change in therapy   Vitamin D, Ergocalciferol, (DRISDOL) 1.25 MG (50000 UT) CAPS capsule Change in therapy   amLODipine (NORVASC) 5 MG tablet Side effect (s)   metoprolol succinate (TOPROL-XL) 100 MG 24 hr tablet    Current Meds  Medication Sig   alendronate (FOSAMAX) 70 MG tablet Take 70 mg by mouth once a week.   Cholecalciferol (VITAMIN D3) 125 MCG (5000 UT) CAPS Take 1 capsule by mouth daily.   ELIQUIS 5 MG TABS tablet TAKE 1 TABLET BY MOUTH  TWICE DAILY   metoprolol succinate (TOPROL-XL) 200 MG 24 hr tablet Take 1 tablet (200 mg total) by mouth daily. Take with or immediately following a meal.   Multiple Vitamin (MULTIVITAMIN WITH MINERALS) TABS tablet Take 1 tablet by mouth daily.   [DISCONTINUED] amLODipine (NORVASC) 5 MG tablet Take 5 mg by mouth daily.   [DISCONTINUED] metoprolol succinate (TOPROL-XL) 100 MG 24 hr tablet TAKE 1 TABLET(100 MG) BY MOUTH DAILY   External labs:    Lab 08/05/2019:  A1c 6.1%.  Hb 12.9/HCT 39.2, platelets 147.  Serum glucose 85 mg, BUN 17, creatinine 1.36, EGFR 45 mL.  Potassium 4.7.  CMP otherwise normal.  Total cholesterol 156, triglycerides 62, HDL 56, LDL 86.  Non-HDL cholesterol 98.  Cardiac Studies:   Valve replacement Operative Report 10/12/11: 28 mm AV and MV 21 mm Edwards pericardial valve replacement for severe AI and mod MR by Dr. Cyndia Bent, Cyndia Bent.  Coronary angiogram 09/26/2016 Normal coronary arteries. Mild to Moderate pulmonary hypertension. CO 3.99, CI 2.42  Pacemaker implantation 12/21/2016 with Medtronic Azure MRI dual chamber pacemaker using His Bundle Pacing  by Cristopher Peru, MD  Carotid artery duplex 05/09/2018  1. Mild amount of  plaque at the level of  the right carotid bulb and ICA origin. Estimated right ICA stenosis is less than 50%.  2. No evidence of left ICA stenosis. 3. Tortuosity of bilateral internal carotid arteries consistent with longstanding hypertension.  Echocardiogram 10/14/2018: Left ventricle cavity is normal in size. Mild concentric hypertrophy of the left ventricle. Normal LV systolic function with EF 66%. Abnormal septal wall motion due to right ventricle pacemaker. Diastolic function not assessed due to paced rhythm. Calculated EF 66%. Left atrial cavity is moderately dilated. Aneurysmal interatrial septum without 2D or color Doppler evidence of interatrial shunt. Bioprosthetic aortic valve with thickening of leaflets. Cannot exclude thrombus. Moderate aortic valve stenosis. Aortic valve mean gradient of 24 mmHg, Vmax of 3.2  m/s. Calculated aortic valve area by continuity equation is 1.1 cm. Trace aortic regurgitation.  Moderate tricuspid regurgitation. Estimated pulmonary artery systolic pressure is 28 mmHg. 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  Scheduled Remote pacemaker check 06/05/2019:    There were 0 atrial high rate episodes detected. There were 2 high ventricular rate episodes detected. Review of EGMs demonstrates NSVT lasting 8 - 9 beats in duration with CL range of 460 - 310 ms. Health trends do not demonstrate significant abnormality. Battery longevity is 10.2 years. RA pacing is 98.4 %, RV pacing is 27.4 %.  Scheduled In office pacemaker check 10/31/2018: There was <1% cumulative atrial arrhythmia burden. Longest AT/AF episode of 17:08:43 on 04/22/2018 with controlled ventricular response.  I HVR episode, EGM suggest 8 beat NSVT. Health trends are normal.  Longevity 10.8 years. AP 100%, VP 6%. Normal pacemaker function.  EKG:  EKG 06/10/2019: Atrially paced rhythm with prolonged first-degree AV block at rate of 76 bpm, PR interval of  100 ms.  Left axis deviation, left anterior fascicular block.  IVCD, LVH with repolarization abnormality, cannot exclude high lateral ischemia.  No significant change from EKG 11/30/2017  Assessment:     ICD-10-CM   1. Essential hypertension  I10 metoprolol succinate (TOPROL-XL) 200 MG 24 hr tablet  2. Paroxysmal atrial fibrillation ([place]). CHA2DS2-VASc Score is 4.  Yearly risk of stroke: 4% (A, F, HTN).     I48.0   3. Pacemaker Dual chamber Medtronic Azure MRI  using His bundle pacing  Z95.0   4. Stage 3a chronic kidney disease  N18.31     Recommendations:   [name]  is a 80 y.o. female  with prior history of severe aortic regurgitation and moderately severe mitral regurgitation. She underwent aortic pericardial tissue valve replacement and mitral valve ring placement on 08/12/2011 with bioprosthetic valves by Dr. Gilford Raid. Since then her ejection fraction is normalized.    She also has sick sinus syndrome and complete heart block needing permanent pacemaker implantation on 12/21/2016 with Medtronic dual-chamber pacemaker. She has had one episode of sustained A. Fib during office pacemaker check on 10/31/18. 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    This is a 89-monthoffice visit, on her last office visit labs reviewed new development of acute renal insufficiency and hence I have discontinued spironolactone.  I had started her on amlodipine for hypertension.  She is asymptomatic without chest pain or shortness of breath, no leg edema.  Has been having back pain with amlodipine hence we will discontinue this and I will increase metoprolol succinate from 100 mg daily to 200 mg daily.  She could certainly try 2.5 mg of amlodipine in the evening as well.  Renal function although has improved, she has now developed stage IIIa CKD.  I reviewed her labs  from PCP.  Will avoid Aldactone and probably will avoid ACE inhibitor's for now and I may consider challenging with an ACE inhibitor or an ARB at a later date.  I would like to see her back in 6 weeks for follow-up of hypertension.   Adrian Prows, MD, [facility] 09/08/2019, 3:53 PM Office: [phone]

## 2019-10-23 ENCOUNTER — Encounter: Payer: Self-pay | Admitting: Cardiology

## 2019-10-23 ENCOUNTER — Other Ambulatory Visit: Payer: Self-pay

## 2019-10-23 ENCOUNTER — Ambulatory Visit: Payer: Medicare Other | Admitting: Cardiology

## 2019-10-23 VITALS — BP 130/80 | HR 79 | Resp 16 | Ht 64.0 in | Wt 161.0 lb

## 2019-10-23 DIAGNOSIS — Z952 Presence of prosthetic heart valve: Secondary | ICD-10-CM

## 2019-10-23 DIAGNOSIS — I480 Paroxysmal atrial fibrillation: Secondary | ICD-10-CM

## 2019-10-23 DIAGNOSIS — I10 Essential (primary) hypertension: Secondary | ICD-10-CM

## 2019-10-23 DIAGNOSIS — Z950 Presence of cardiac pacemaker: Secondary | ICD-10-CM

## 2019-10-23 MED ORDER — AMLODIPINE BESYLATE 5 MG PO TABS: 5.0000 mg | ORAL_TABLET | Freq: Every day | ORAL | 3 refills | Status: AC

## 2019-10-23 NOTE — Progress Notes (Signed)
Primary Physician:  Vernie Shanks, MD   Patient ID: [name], female    DOB: [birth year], 79 y.o.   MRN: [id number]  Subjective:    Chief Complaint  Patient presents with  . Hypertension  . Follow-up    6 week    HPI: [name]  is a 79 y.o. female female  with prior history of severe aortic regurgitation and moderately severe mitral regurgitation. She underwent aortic pericardial tissue valve replacement and mitral valve ring placement on 08/12/2011 with bioprosthetic valves by Dr. Gilford Raid. Since then her ejection fraction is normalized.    She also has sick sinus syndrome and complete heart block needing permanent pacemaker implantation on 12/21/2016 with Medtronic dual-chamber pacemaker. She has had one episode of sustained A. Fib during office pacemaker check on 10/31/18. Initially felt to have severe aortic stenosis due to degenerative aortic valve however there was a mismatch between echo and cardiac catheterization findings and CT scan.  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 twice daily, now increased to 5 mg BID due to PAF.   On her last office visit I increase the dose of metoprolol and also added amlodipine 5 mg daily for uncontrolled hypertension.  I also discontinued spironolactone.  She now presents for follow-up.  States that she is tolerating all the medications well and has noticed improvement in blood pressure.  Past Medical History:  Diagnosis Date  . Aortic stenosis    2013  . Arthritis    "hips" (12/21/2016)  . Bioprosthetic 28 mm AV and 21 mm MV 10/11/2016  . Chronic diastolic CHF (congestive heart failure) ([place])   . Chronic kidney disease   . Complication of anesthesia    after hip surgery 06-2016 bp and heart rate dropped pt. has had a pacemaker placed since this event.  . Encounter for care of pacemaker 09/07/2018   . H/O atrial fibrillation without current medication 06/06/2018   2013 post AV and MV replacement and no recurrence. On Eliquis for AV thrombotic degeneration  . History of blood transfusion 05/2016   "when I had my hip replaced" (12/21/2016)  . Hypertension   . Paroxysmal atrial fibrillation ([place]) 06/06/2018   2013 post AV and MV replacement and no recurrence. On Eliquis for AV thrombotic degeneration  . Presence of permanent cardiac pacemaker    medtronic  . Shortness of breath    hx dyspnea and respiratory abnormalities  . Sinus node dysfunction ([place]) 12/21/2016    Past Surgical History:  Procedure Laterality Date  . AORTIC VALVE REPLACEMENT  10/12/2011   Procedure: AORTIC VALVE REPLACEMENT (AVR);  Surgeon: Gaye Pollack, MD;  Location: [place];  Service: Open Heart Surgery;  Laterality: N/A;  . CARDIAC CATHETERIZATION     "I've had several; no OR" (12/21/2016)  . CARDIAC VALVE REPLACEMENT    . CARDIOVERSION  12/26/2011   Procedure: CARDIOVERSION;  Surgeon: Laverda Page, MD;  Location: [place];  Service: Cardiovascular;  Laterality: N/A;  . CORONARY ANGIOGRAPHY N/A 09/26/2016   Procedure: CORONARY ANGIOGRAPHY;  Surgeon: Nigel Mormon, MD;  Location: [place] [facility];  Service: Cardiovascular;  Laterality: N/A;  . EXPLORATION POST OPERATIVE OPEN HEART  10/12/2011   Procedure: EXPLORATION POST OPERATIVE OPEN HEART;  Surgeon: Gaye Pollack, MD;  Location: [place] [facility];  Service: Open Heart Surgery;  Laterality: N/A;  . INSERT / REPLACE /  REMOVE PACEMAKER  12/21/2016  . JOINT REPLACEMENT     right hip 06-2016 Left hip 02-08-17 Dr. Lyla Glassing  . LEFT AND RIGHT HEART CATHETERIZATION WITH CORONARY ANGIOGRAM N/A 09/11/2011   Procedure: LEFT AND RIGHT HEART CATHETERIZATION WITH CORONARY ANGIOGRAM;  Surgeon: Laverda Page, MD;  Location: [facility] CATH LAB;  Service: Cardiovascular;  Laterality: N/A;  . MITRAL VALVE REPAIR  10/12/2011   Procedure: MITRAL VALVE REPAIR (MVR);  Surgeon: Gaye Pollack, MD;  Location: [place];  Service: Open Heart Surgery;  Laterality: N/A;  mitral valve annuloplasty  . PACEMAKER IMPLANT N/A 12/21/2016   Procedure: PACEMAKER IMPLANT;  Surgeon: Evans Lance, MD;  Location: [place] [facility];  Service: Cardiovascular;  Laterality: N/A;  . RIGHT HEART CATH N/A 09/26/2016   Procedure: RIGHT HEART CATH;  Surgeon: Nigel Mormon, MD;  Location: [place] [facility];  Service: Cardiovascular;  Laterality: N/A;  . TEE WITHOUT CARDIOVERSION N/A 08/22/2016   Procedure: TRANSESOPHAGEAL ECHOCARDIOGRAM (TEE);  Surgeon: Adrian Prows, MD;  Location[facility] END[facility];  Service: Cardiovascular;  Laterality: N/A;  . TEE WITHOUT CARDIOVERSION N/A 12/12/2016   Procedure: TRANSESOPHAGEAL ECHOCARDIOGRAM (TEE);  Surgeon: Dorothy Spark, MD;  Location: [facility] ENDOSCOPY;  Service: Cardiovascular;  Laterality: N/A;  . TOTAL HIP ARTHROPLASTY Right 06/15/2016   Procedure: RIGHT TOTAL HIP ARTHROPLASTY ANTERIOR APPROACH;  Surgeon: Rod Can, MD;  Location: [facility];  Service: Orthopedics;  Laterality: Right;  Dr. needs RNFA  . TOTAL HIP ARTHROPLASTY Left 02/08/2017   Procedure: LEFT TOTAL HIP ARTHROPLASTY ANTERIOR APPROACH;  Surgeon: Rod Can, MD;  Location: [facility];  Service: Orthopedics;  Laterality: Left;  Needs RNFA   Social History   Tobacco Use  . Smoking status: Never Smoker  . Smokeless tobacco: Never Used  Substance Use Topics  . Alcohol use: No   Marital Status: Single   Review of Systems  Cardiovascular: Negative for chest pain, dyspnea on exertion and leg swelling.  Gastrointestinal: Negative for melena.   Objective:  Blood pressure 130/80, pulse 79, resp. rate 16, height 5' 4" (1.626 m), weight 161 lb (73 kg), SpO2 97 %. Body mass index is 27.64 kg/m.   Vitals with BMI 10/23/2019 09/08/2019 09/08/2019  Height 5' 4" - 5' 4"  Weight 161 lbs - 163 lbs  BMI 16.10 - 96.04  Systolic 540 981 191  Diastolic 80 88 90  Pulse 79 - 78      Physical  Exam Constitutional:      General: She is not in acute distress.    Appearance: She is well-developed.  Eyes:     Conjunctiva/sclera: Conjunctivae normal.  Cardiovascular:     Rate and Rhythm: Normal rate and regular rhythm.     Pulses: Normal pulses and intact distal pulses.          Carotid pulses are on the right side with bruit and on the left side with bruit.    Heart sounds: S1 normal and S2 normal. Murmur heard.  Harsh mid to late systolic murmur is present with a grade of 2/6 at the upper right sternal border radiating to the neck.  No gallop.      Comments: No JVD. No pedal edema Pulmonary:     Effort: Pulmonary effort is normal.     Breath sounds: Normal breath sounds.  Abdominal:     General: Bowel sounds are normal.     Palpations: Abdomen is soft.  Musculoskeletal:     Cervical back: Neck supple.  Radiology: No results found.  Laboratory examination:    CMP Latest Ref Rng & Units 09/03/2019 07/10/2019 06/19/2019  Glucose 65 - 99 mg/dL 85 103(H) 91  BUN 8 - 27 mg/dL 12 20 30(H)  Creatinine 0.57 - 1.00 mg/dL 1.40(H) 1.56(H) 1.77(H)  Sodium 134 - 144 mmol/L 142 140 139  Potassium 3.5 - 5.2 mmol/L 4.6 5.0 4.9  Chloride 96 - 106 mmol/L 105 103 103  CO2 20 - 29 mmol/L _0 Calcium 8.7 - 10.3 mg/dL 10.1 10.6(H) 10.5(H)  Total Protein 6.0 - 8.5 g/dL - - 7.7  Total Bilirubin 0.0 - 1.2 mg/dL - - 0.4  Alkaline Phos 39 - 117 IU/L - - 50  AST 0 - 40 IU/L - - 16  ALT 0 - 32 IU/L - - 9   CBC Latest Ref Rng & Units 06/19/2019 05/09/2018 04/18/2017  WBC 3.4 - 10.8 x10E3/uL 5.6 5.1 6.3  Hemoglobin 11.1 - 15.9 g/dL 12.7 13.9 12.2  Hematocrit 34.0 - 46.6 % 39.0 44.3 36.2  Platelets 150 - 450 x10E3/uL 144(L) 97(L) 159   Lipid Panel     Component Value Date/Time   CHOL 141 08/22/2011 0635   TRIG 72 08/22/2011 0635   HDL 67 08/22/2011 0635   CHOLHDL 2.1 08/22/2011 0635   VLDL 14 08/22/2011 0635   LDLCALC 60 08/22/2011 0635   HEMOGLOBIN A1C Lab Results  Component  Value Date   HGBA1C 6.3 (H) 10/10/2011   MPG 134 (H) 10/10/2011   TSH Recent Labs    06/19/19 1331  TSH 0.726   PRN Meds:. Medications Discontinued During This Encounter  Medication Reason  . amLODipine (NORVASC) 5 MG tablet Reorder   Current Meds  Medication Sig  . alendronate (FOSAMAX) 70 MG tablet Take 70 mg by mouth once a week.  Marland Kitchen amLODipine (NORVASC) 5 MG tablet Take 1 tablet (5 mg total) by mouth daily.  . Cholecalciferol (VITAMIN D3) 125 MCG (5000 UT) CAPS Take 1 capsule by mouth daily.  Marland Kitchen ELIQUIS 5 MG TABS tablet TAKE 1 TABLET BY MOUTH  TWICE DAILY  . metoprolol succinate (TOPROL-XL) 200 MG 24 hr tablet Take 1 tablet (200 mg total) by mouth daily. Take with or immediately following a meal.  . Multiple Vitamin (MULTIVITAMIN WITH MINERALS) TABS tablet Take 1 tablet by mouth daily.  . [DISCONTINUED] amLODipine (NORVASC) 5 MG tablet Take 5 mg by mouth daily.   External labs:   Lab 08/05/2019:  A1c 6.1%.  Hb 12.9/HCT 39.2, platelets 147.  Serum glucose 85 mg, BUN 17, creatinine 1.36, EGFR 45 mL.  Potassium 4.7.  CMP otherwise normal.  Total cholesterol 156, triglycerides 62, HDL 56, LDL 86.  Non-HDL cholesterol 98.  Cardiac Studies:   Valve replacement Operative Report 10/12/11: 28 mm AV and MV 21 mm Edwards pericardial valve replacement for severe AI and mod MR by Dr. Cyndia Bent, Cyndia Bent.  Coronary angiogram 09/26/2016 Normal coronary arteries. Mild to Moderate pulmonary hypertension. CO 3.99, CI 2.42  Pacemaker implantation 12/21/2016 with Medtronic Azure MRI dual chamber pacemaker using His Bundle Pacing  by Cristopher Peru, MD  Carotid artery duplex 05/09/2018  1. Mild amount of plaque at the level of the right carotid bulb and ICA origin. Estimated right ICA stenosis is less than 50%.  2. No evidence of left ICA stenosis. 3. Tortuosity of bilateral internal carotid arteries consistent with longstanding hypertension.  Echocardiogram 10/14/2018: Left ventricle cavity  is normal in size. Mild concentric hypertrophy of the left ventricle. Normal LV systolic  function with EF 66%. Abnormal septal wall motion due to right ventricle pacemaker. Diastolic function not assessed due to paced rhythm. Calculated EF 66%. Left atrial cavity is moderately dilated. Aneurysmal interatrial septum without 2D or color Doppler evidence of interatrial shunt. Bioprosthetic aortic valve with thickening of leaflets. Cannot exclude thrombus. Moderate aortic valve stenosis. Aortic valve mean gradient of 24 mmHg, Vmax of 3.2  m/s. Calculated aortic valve area by continuity equation is 1.1 cm. Trace aortic regurgitation.  Moderate tricuspid regurgitation. Estimated pulmonary artery systolic pressure is 28 mmHg. 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  Scheduled Remote pacemaker check 09/03/2019:    There were 0 atrial high rate episodes detected. There were 3 high ventricular rate episodes detected. Review of EGMs demonstrates NSVT lasting 7 - 11 beats with median ventricular rates of 170 - 226 bpm. Health trends do not demonstrate significant abnormality. Patient has h/o NSVT and normal LVEF and normal coronary arteries. Battery longevity is 9.8 years. RA pacing is 97.2 %, RV pacing is 39.1 %.  Scheduled In office pacemaker check 10/31/2018: There was <1% cumulative atrial arrhythmia burden. Longest AT/AF episode of 17:08:43 on 04/22/2018 with controlled ventricular response.  I HVR episode, EGM suggest 8 beat NSVT. Health trends are normal.  Longevity 10.8 years. AP 100%, VP 6%. Normal pacemaker function.  EKG:  EKG 06/10/2019: Atrially paced rhythm with prolonged first-degree AV block at rate of 76 bpm, PR interval of 100 ms.  Left axis deviation, left anterior fascicular block.  IVCD, LVH with repolarization abnormality, cannot exclude high lateral ischemia.  No significant change from EKG 11/30/2017  Assessment:     ICD-10-CM   1.  Essential hypertension  I10 amLODipine (NORVASC) 5 MG tablet  2. Paroxysmal atrial fibrillation ([place]). CHA2DS2-VASc Score is 4.  Yearly risk of stroke: 4% (A, F, HTN).     I48.0   3. Pacemaker Dual chamber Medtronic Azure MRI  using His bundle pacing  Z95.0   4. Bioprosthetic 28 mm AV and 21 mm MV 10/12/11  Z95.2     Recommendations:   ESTEE YOHE  is a 79 y.o. female  with prior history of severe aortic regurgitation and moderately severe mitral regurgitation. She underwent aortic pericardial tissue valve replacement and mitral valve ring placement on 08/12/2011 with bioprosthetic valves by Dr. Gilford Raid. Since then her ejection fraction is normalized.    She also has sick sinus syndrome and complete heart block needing permanent pacemaker implantation on 12/21/2016 with Medtronic dual-chamber pacemaker. She has had one episode of sustained A. Fib during office pacemaker check on 10/31/18. 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    On her last office visit I increase the dose of metoprolol and also added amlodipine which he is tolerating and now the blood pressure is very well controlled.  I also reviewed the data from her recent pacemaker transmission, pacemaker function is normal.  She has not had any recurrence of atrial fibrillation recently.  She is tolerating anticoagulation well.  She has stage IIIb chronic kidney disease, since discontinuing spironolactone, renal function is improved but I would like to repeat another BMP and I requested her to get the blood work done in the next 1 week.  Otherwise stable from cardiac standpoint I will see her back in 6 months.   Adrian Prows, MD, [facility] 10/23/2019,  11:54 AM Office: [phone]

## 2019-12-04 ENCOUNTER — Other Ambulatory Visit: Payer: Self-pay | Admitting: Cardiology

## 2019-12-04 DIAGNOSIS — I10 Essential (primary) hypertension: Secondary | ICD-10-CM

## 2019-12-10 ENCOUNTER — Ambulatory Visit: Payer: Medicare Other | Admitting: Cardiology

## 2019-12-11 ENCOUNTER — Telehealth: Payer: Self-pay | Admitting: Cardiology

## 2019-12-12 NOTE — Telephone Encounter (Signed)
Called and spoke with patient regarding her pacemaker check results.  

## 2020-01-02 ENCOUNTER — Other Ambulatory Visit: Payer: Self-pay

## 2020-01-02 DIAGNOSIS — I10 Essential (primary) hypertension: Secondary | ICD-10-CM

## 2020-01-03 ENCOUNTER — Other Ambulatory Visit: Payer: Self-pay | Admitting: Cardiovascular Disease

## 2020-01-09 LAB — BASIC METABOLIC PANEL
BUN/Creatinine Ratio: 10 — ABNORMAL LOW (ref 12–28)
BUN: 12 mg/dL (ref 8–27)
CO2: 23 mmol/L (ref 20–29)
Calcium: 10.1 mg/dL (ref 8.7–10.3)
Chloride: 103 mmol/L (ref 96–106)
Creatinine, Ser: 1.22 mg/dL — ABNORMAL HIGH (ref 0.57–1.00)
GFR calc Af Amer: 48 mL/min/{1.73_m2} — ABNORMAL LOW (ref >59)
GFR calc non Af Amer: 42 mL/min/{1.73_m2} — ABNORMAL LOW (ref >59)
Glucose: 107 mg/dL — ABNORMAL HIGH (ref 65–99)
Potassium: 4.5 mmol/L (ref 3.5–5.2)
Sodium: 142 mmol/L (ref 134–144)

## 2020-01-12 ENCOUNTER — Ambulatory Visit: Payer: Medicare Other | Admitting: Cardiology

## 2020-03-04 DIAGNOSIS — I495 Sick sinus syndrome: Secondary | ICD-10-CM | POA: Diagnosis not present

## 2020-03-04 DIAGNOSIS — Z950 Presence of cardiac pacemaker: Secondary | ICD-10-CM | POA: Diagnosis not present

## 2020-03-04 DIAGNOSIS — Z45018 Encounter for adjustment and management of other part of cardiac pacemaker: Secondary | ICD-10-CM | POA: Diagnosis not present

## 2020-03-18 DIAGNOSIS — I129 Hypertensive chronic kidney disease with stage 1 through stage 4 chronic kidney disease, or unspecified chronic kidney disease: Secondary | ICD-10-CM | POA: Diagnosis not present

## 2020-03-18 DIAGNOSIS — M810 Age-related osteoporosis without current pathological fracture: Secondary | ICD-10-CM | POA: Diagnosis not present

## 2020-03-18 DIAGNOSIS — M1612 Unilateral primary osteoarthritis, left hip: Secondary | ICD-10-CM | POA: Diagnosis not present

## 2020-03-18 DIAGNOSIS — I10 Essential (primary) hypertension: Secondary | ICD-10-CM | POA: Diagnosis not present

## 2020-03-18 DIAGNOSIS — I480 Paroxysmal atrial fibrillation: Secondary | ICD-10-CM | POA: Diagnosis not present

## 2020-03-18 DIAGNOSIS — N183 Chronic kidney disease, stage 3 unspecified: Secondary | ICD-10-CM | POA: Diagnosis not present

## 2020-03-19 ENCOUNTER — Other Ambulatory Visit: Payer: Self-pay | Admitting: Cardiology

## 2020-04-05 DIAGNOSIS — R6889 Other general symptoms and signs: Secondary | ICD-10-CM | POA: Diagnosis not present

## 2020-04-05 DIAGNOSIS — I129 Hypertensive chronic kidney disease with stage 1 through stage 4 chronic kidney disease, or unspecified chronic kidney disease: Secondary | ICD-10-CM | POA: Diagnosis not present

## 2020-04-05 DIAGNOSIS — I480 Paroxysmal atrial fibrillation: Secondary | ICD-10-CM | POA: Diagnosis not present

## 2020-04-05 DIAGNOSIS — M1612 Unilateral primary osteoarthritis, left hip: Secondary | ICD-10-CM | POA: Diagnosis not present

## 2020-04-05 DIAGNOSIS — N1831 Chronic kidney disease, stage 3a: Secondary | ICD-10-CM | POA: Diagnosis not present

## 2020-04-05 DIAGNOSIS — I10 Essential (primary) hypertension: Secondary | ICD-10-CM | POA: Diagnosis not present

## 2020-04-05 DIAGNOSIS — E559 Vitamin D deficiency, unspecified: Secondary | ICD-10-CM | POA: Diagnosis not present

## 2020-04-05 DIAGNOSIS — I739 Peripheral vascular disease, unspecified: Secondary | ICD-10-CM | POA: Diagnosis not present

## 2020-04-05 DIAGNOSIS — M810 Age-related osteoporosis without current pathological fracture: Secondary | ICD-10-CM | POA: Diagnosis not present

## 2020-04-16 DIAGNOSIS — R6889 Other general symptoms and signs: Secondary | ICD-10-CM | POA: Diagnosis not present

## 2020-04-26 ENCOUNTER — Ambulatory Visit: Payer: Medicare Other | Admitting: Cardiology

## 2020-04-26 ENCOUNTER — Other Ambulatory Visit: Payer: Self-pay

## 2020-04-26 DIAGNOSIS — I10 Essential (primary) hypertension: Secondary | ICD-10-CM

## 2020-04-26 MED ORDER — METOPROLOL SUCCINATE ER 200 MG PO TB24: 200.0000 mg | ORAL_TABLET | Freq: Every day | ORAL | 3 refills | Status: DC

## 2020-04-30 ENCOUNTER — Ambulatory Visit: Payer: Medicare Other | Admitting: Cardiology

## 2020-04-30 ENCOUNTER — Encounter: Payer: Self-pay | Admitting: Cardiology

## 2020-04-30 ENCOUNTER — Other Ambulatory Visit: Payer: Self-pay

## 2020-04-30 VITALS — BP 127/81 | HR 76 | Temp 97.6°F | Resp 17 | Ht 64.0 in | Wt 163.6 lb

## 2020-04-30 DIAGNOSIS — I10 Essential (primary) hypertension: Secondary | ICD-10-CM

## 2020-04-30 DIAGNOSIS — R6889 Other general symptoms and signs: Secondary | ICD-10-CM | POA: Diagnosis not present

## 2020-04-30 DIAGNOSIS — Z950 Presence of cardiac pacemaker: Secondary | ICD-10-CM | POA: Diagnosis not present

## 2020-04-30 DIAGNOSIS — I480 Paroxysmal atrial fibrillation: Secondary | ICD-10-CM

## 2020-04-30 DIAGNOSIS — I495 Sick sinus syndrome: Secondary | ICD-10-CM

## 2020-04-30 DIAGNOSIS — T82857D Stenosis of cardiac prosthetic devices, implants and grafts, subsequent encounter: Secondary | ICD-10-CM

## 2020-04-30 MED ORDER — METOPROLOL SUCCINATE ER 100 MG PO TB24: 100.0000 mg | ORAL_TABLET | Freq: Every day | ORAL | 3 refills | Status: DC

## 2020-04-30 NOTE — Progress Notes (Signed)
Primary Physician:  Vernie Shanks, MD   Patient ID: [name], female    DOB: October 30, 1940, 80 y.o.   MRN: [id number]  Subjective:    Chief Complaint  Patient presents with  . Hypertension       . Follow-up    6 month  . Atrial Fibrillation  . av replacement    HPI: [name]  is a 80 y.o. female female  with prior history of severe aortic regurgitation and moderately severe mitral regurgitation. She underwent aortic pericardial tissue valve replacement and mitral valve ring placement on 08/12/2011 with bioprosthetic valves by Dr. Gilford Raid. Since then her ejection fraction is normalized.    She also has sick sinus syndrome and complete heart block needing permanent pacemaker implantation on 12/21/2016 with Medtronic dual-chamber pacemaker. She has had one episode of sustained A. Fib during office pacemaker check on 10/31/18. Initially felt to have severe aortic stenosis due to degenerative aortic valve however there was a mismatch between echo and cardiac catheterization findings and CT scan.  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 twice daily, now increased to 5 mg BID due to PAF.   Patient presents for 40-monthfollow-up.  She is presently doing well, without specific complaints.  She is tolerating medications without issue and continues to walk daily for approximately 50 minutes without issue.  Patient reports her PCP started her on Crestor last month 3 days/week with plan to recheck lipid profile testing in April.  She is tolerating Eliquis without bleeding diathesis.  Patient reports home blood pressure monitoring with readings that are under excellent control.  Denies chest pain, palpitations, syncope, near syncope, orthopnea, PND, dyspnea.   Past Medical History:  Diagnosis Date  . Aortic stenosis    2013  . Arthritis    "hips"  (12/21/2016)  . Bioprosthetic 28 mm AV and 21 mm MV 10/11/2016  . Chronic diastolic CHF (congestive heart failure) ([facility]   . Chronic kidney disease   . Complication of anesthesia    after hip surgery 06-2016 bp and heart rate dropped pt. has had a pacemaker placed since this event.  . Encounter for care of pacemaker 09/07/2018  . H/O atrial fibrillation without current medication 06/06/2018   2013 post AV and MV replacement and no recurrence. On Eliquis for AV thrombotic degeneration  . History of blood transfusion 05/2016   "when I had my hip replaced" (12/21/2016)  . Hypertension   . Paroxysmal atrial fibrillation ([facility] 06/06/2018   2013 post AV and MV replacement and no recurrence. On Eliquis for AV thrombotic degeneration  . Presence of permanent cardiac pacemaker    medtronic  . Shortness of breath    hx dyspnea and respiratory abnormalities  . Sinus node dysfunction ([facility] 12/21/2016   Family History  Problem Relation Age of Onset  . Hypertension Mother   . Hypertension Sister   . Hypertension Sister   . Hypertension Sister      Past Surgical History:  Procedure Laterality Date  . AORTIC VALVE REPLACEMENT  10/12/2011   Procedure: AORTIC VALVE REPLACEMENT (AVR);  Surgeon: BGaye Pollack MD;  [place] [facility]  Service: Open Heart Surgery;  Laterality: N/A;  . CARDIAC CATHETERIZATION     "I've had several; no OR" (12/21/2016)  . CARDIAC VALVE REPLACEMENT    . CARDIOVERSION  12/26/2011   Procedure: CARDIOVERSION;  Surgeon:  Laverda Page, MD;  Location: [place];  Service: Cardiovascular;  Laterality: N/A;  . CORONARY ANGIOGRAPHY N/A 09/26/2016   Procedure: CORONARY ANGIOGRAPHY;  Surgeon: Nigel Mormon, MD;  Location: [place] [facility];  Service: Cardiovascular;  Laterality: N/A;  . EXPLORATION POST OPERATIVE OPEN HEART  10/12/2011   Procedure: EXPLORATION POST OPERATIVE OPEN HEART;  Surgeon: Gaye Pollack, MD;  Location: [place] [facility];  Service: Open Heart Surgery;  Laterality:  N/A;  . INSERT / REPLACE / REMOVE PACEMAKER  12/21/2016  . JOINT REPLACEMENT     right hip 06-2016 Left hip 02-08-17 Dr. Lyla Glassing  . LEFT AND RIGHT HEART CATHETERIZATION WITH CORONARY ANGIOGRAM N/A 09/11/2011   Procedure: LEFT AND RIGHT HEART CATHETERIZATION WITH CORONARY ANGIOGRAM;  Surgeon: Laverda Page, MD;  Location: [facility] CATH LAB;  Service: Cardiovascular;  Laterality: N/A;  . MITRAL VALVE REPAIR  10/12/2011   Procedure: MITRAL VALVE REPAIR (MVR);  Surgeon: Gaye Pollack, MD;  Location: [place];  Service: Open Heart Surgery;  Laterality: N/A;  mitral valve annuloplasty  . PACEMAKER IMPLANT N/A 12/21/2016   Procedure: PACEMAKER IMPLANT;  Surgeon: Evans Lance, MD;  Location: [place] [facility];  Service: Cardiovascular;  Laterality: N/A;  . RIGHT HEART CATH N/A 09/26/2016   Procedure: RIGHT HEART CATH;  Surgeon: Nigel Mormon, MD;  Location: [place] [facility];  Service: Cardiovascular;  Laterality: N/A;  . TEE WITHOUT CARDIOVERSION N/A 08/22/2016   Procedure: TRANSESOPHAGEAL ECHOCARDIOGRAM (TEE);  Surgeon: Adrian Prows, MD;  Location: [facility] END[facility]COPY;  Service: Cardiovascular;  Laterality: N/A;  . TEE WITHOUT CARDIOVERSION N/A 12/12/2016   Procedure: TRANSESOPHAGEAL ECHOCARDIOGRAM (TEE);  Surgeon: Dorothy Spark, MD;  Location: [facility] ENDOSCOPY;  Service: Cardiovascular;  Laterality: N/A;  . TOTAL HIP ARTHROPLASTY Right 06/15/2016   Procedure: RIGHT TOTAL HIP ARTHROPLASTY ANTERIOR APPROACH;  Surgeon: Rod Can, MD;  Location: [facility];  Service: Orthopedics;  Laterality: Right;  Dr. needs RNFA  . TOTAL HIP ARTHROPLASTY Left 02/08/2017   Procedure: LEFT TOTAL HIP ARTHROPLASTY ANTERIOR APPROACH;  Surgeon: Rod Can, MD;  Location: [facility];  Service: Orthopedics;  Laterality: Left;  Needs RNFA   Social History   Tobacco Use  . Smoking status: Never Smoker  . Smokeless tobacco: Never Used  Substance Use Topics  . Alcohol use: No   Marital Status: Single   Review of Systems   Constitutional: Negative for malaise/fatigue and weight gain.  Cardiovascular: Negative for chest pain, claudication, dyspnea on exertion, leg swelling, near-syncope, orthopnea, palpitations, paroxysmal nocturnal dyspnea and syncope.  Respiratory: Negative for shortness of breath.   Hematologic/Lymphatic: Does not bruise/bleed easily.  Gastrointestinal: Negative for melena.  Neurological: Negative for dizziness and weakness.   Objective:  Blood pressure 127/81, pulse 76, temperature 97.6 F (36.4 C), temperature source Temporal, resp. rate 17, height _0  (1.626 m), weight 163 lb 9.6 oz (74.2 kg), SpO2 98 %. Body mass index is 28.08 kg/m.   Vitals with BMI 04/30/2020 04/30/2020 10/23/2019  Height - _1  _2   Weight - 163 lbs 10 oz 161 lbs  BMI - 56.38 75.64  Systolic 332 951 884  Diastolic 81 96 80  Pulse 76 75 79      Physical Exam Vitals reviewed.  Constitutional:      General: She is not in acute distress.    Appearance: She is well-developed.  HENT:     Head: Normocephalic and atraumatic.  Eyes:     Conjunctiva/sclera: Conjunctivae normal.  Cardiovascular:  Rate and Rhythm: Normal rate and regular rhythm.     Pulses: Normal pulses and intact distal pulses.          Carotid pulses are on the right side with bruit and on the left side with bruit.    Heart sounds: S1 normal and S2 normal. Murmur heard.   Harsh mid to late systolic murmur is present with a grade of 2/6 at the upper right sternal border radiating to the neck. No gallop.      Comments: No JVD. Pulmonary:     Effort: Pulmonary effort is normal. No respiratory distress.     Breath sounds: Normal breath sounds. No wheezing, rhonchi or rales.  Abdominal:     General: Bowel sounds are normal.     Palpations: Abdomen is soft.  Musculoskeletal:     Cervical back: Neck supple.     Right lower leg: Edema (trace) present.     Left lower leg: Edema (trace) present.  Neurological:     Mental Status: She is  alert.    Radiology: No results found.  Laboratory examination:    CMP Latest Ref Rng & Units 01/08/2020 09/03/2019 07/10/2019  Glucose 65 - 99 mg/dL 107(H) 85 103(H)  BUN 8 - 27 mg/dL _0 Creatinine 0.57 - 1.00 mg/dL 1.22(H) 1.40(H) 1.56(H)  Sodium 134 - 144 mmol/L 142 142 140  Potassium 3.5 - 5.2 mmol/L 4.5 4.6 5.0  Chloride 96 - 106 mmol/L 103 105 103  CO2 20 - 29 mmol/L _1 Calcium 8.7 - 10.3 mg/dL 10.1 10.1 10.6(H)  Total Protein 6.0 - 8.5 g/dL - - -  Total Bilirubin 0.0 - 1.2 mg/dL - - -  Alkaline Phos 39 - 117 IU/L - - -  AST 0 - 40 IU/L - - -  ALT 0 - 32 IU/L - - -   CBC Latest Ref Rng & Units 06/19/2019 05/09/2018 04/18/2017  WBC 3.4 - 10.8 x10E3/uL 5.6 5.1 6.3  Hemoglobin 11.1 - 15.9 g/dL 12.7 13.9 12.2  Hematocrit 34.0 - 46.6 % 39.0 44.3 36.2  Platelets 150 - 450 x10E3/uL 144(L) 97(L) 159   Lipid Panel     Component Value Date/Time   CHOL 141 08/22/2011 0635   TRIG 72 08/22/2011 0635   HDL 67 08/22/2011 0635   CHOLHDL 2.1 08/22/2011 0635   VLDL 14 08/22/2011 0635   LDLCALC 60 08/22/2011 0635   HEMOGLOBIN A1C Lab Results  Component Value Date   HGBA1C 6.3 (H) 10/10/2011   MPG 134 (H) 10/10/2011   TSH Recent Labs    06/19/19 1331  TSH 0.726   PRN Meds:. Medications Discontinued During This Encounter  Medication Reason  . metoprolol (TOPROL-XL) 200 MG 24 hr tablet Reorder   Current Meds  Medication Sig  . alendronate (FOSAMAX) 70 MG tablet Take 70 mg by mouth once a week.  Marland Kitchen amLODipine (NORVASC) 5 MG tablet Take 1 tablet (5 mg total) by mouth daily.  . Ascorbic Acid (VITAMIN C) 500 MG CAPS Take 1 capsule by mouth daily.  . Cholecalciferol (VITAMIN D3) 125 MCG (5000 UT) CAPS Take 1 capsule by mouth daily.  Marland Kitchen ELIQUIS 5 MG TABS tablet TAKE 1 TABLET BY MOUTH  TWICE DAILY  . ibandronate (BONIVA) 150 MG tablet Take 1 tablet by mouth every 30 (thirty) days.  . Multiple Vitamin (MULTIVITAMIN WITH MINERALS) TABS tablet Take 1 tablet by mouth  daily.  . rosuvastatin (CRESTOR) 10 MG tablet Take 10 mg by mouth  3 (three) times a week.  . [DISCONTINUED] metoprolol (TOPROL-XL) 200 MG 24 hr tablet Take 1 tablet (200 mg total) by mouth daily. Take with or immediately following a meal. (Patient taking differently: Take 100 mg by mouth daily. Take with or immediately following a meal.)   External labs:   Lab 08/05/2019:  A1c 6.1%.  Hb 12.9/HCT 39.2, platelets 147.  Serum glucose 85 mg, BUN 17, creatinine 1.36, EGFR 45 mL.  Potassium 4.7.  CMP otherwise normal.  Total cholesterol 156, triglycerides 62, HDL 56, LDL 86.  Non-HDL cholesterol 98.  Cardiac Studies:   Valve replacement Operative Report 10/12/11: 28 mm AV and MV 21 mm Edwards pericardial valve replacement for severe AI and mod MR by Dr. Cyndia Bent, Cyndia Bent.  Coronary angiogram 09/26/2016 Normal coronary arteries. Mild to Moderate pulmonary hypertension. CO 3.99, CI 2.42  Pacemaker implantation 12/21/2016 with Medtronic Azure MRI dual chamber pacemaker using His Bundle Pacing  by Cristopher Peru, MD  Carotid artery duplex 05/09/2018  1. Mild amount of plaque at the level of the right carotid bulb and ICA origin. Estimated right ICA stenosis is less than 50%.  2. No evidence of left ICA stenosis. 3. Tortuosity of bilateral internal carotid arteries consistent with longstanding hypertension.  Echocardiogram 10/14/2018: Left ventricle cavity is normal in size. Mild concentric hypertrophy of the left ventricle. Normal LV systolic function with EF 66%. Abnormal septal wall motion due to right ventricle pacemaker. Diastolic function not assessed due to paced rhythm. Calculated EF 66%. Left atrial cavity is moderately dilated. Aneurysmal interatrial septum without 2D or color Doppler evidence of interatrial shunt. Bioprosthetic aortic valve with thickening of leaflets. Cannot exclude thrombus. Moderate aortic valve stenosis. Aortic valve mean gradient of 24 mmHg, Vmax of 3.2  m/s.  Calculated aortic valve area by continuity equation is 1.1 cm. Trace aortic regurgitation.  Moderate tricuspid regurgitation. Estimated pulmonary artery systolic pressure is 28 mmHg. 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  Scheduled Remote pacemaker check 12/04/2019:  There were 0 atrial high rate episodes detected. There were 2 high ventricular rate episodes detected. Review of EGMs demonstrates NSVT lasting 7 - 8 beats, unchanged from prior transmission. Health trends do not demonstrate significant abnormality. Patient has h/o NSVT and normal LVEF and normal coronary arteries. Battery longevity is 9.2 years. RA pacing is 95.7 %, RV pacing is 56.8 %.  Unscheduled (Alert) 03/04/19  There were 0 atrial high rate episodes detected. There were 2 ventricular high rate episodes recorded on Nov 20 & 22, 2021, consistent with NSVT at rates of 160 bpm and 188 bpm respectively. Plot diagrams suggest both NSVT episodes lasted greater than 15 beats, however, EGMs were not recorded during this time. Battery longevity is 8.8 years. RA pacing is 96.4 %, RV pacing is 48.2 %. Health trends (patient activity, heart rate variability, average heart rates) are stable. Patient has prior history of NSVT.   Scheduled In office pacemaker check 10/31/2018: There was <1% cumulative atrial arrhythmia burden. Longest AT/AF episode of 17:08:43 on 04/22/2018 with controlled ventricular response. I HVR episode, EGM suggest 8 beat NSVT. Health trends are normal.  Longevity 10.8 years. AP 100%, VP 6%. Normal pacemaker function    EKG   EKG 04/30/2020: Atrial paced rhythm with first-degree AV block at a rate of 65 bpm.  Left axis.  Left anterior fascicular block.  LVH with repolarization abnormality, cannot exclude ischemia.  EKG 06/10/2019: Atrially paced rhythm with prolonged first-degree AV block at rate of  76 bpm, PR interval of 100 ms.  Left axis deviation, left anterior  fascicular block.  IVCD, LVH with repolarization abnormality, cannot exclude high lateral ischemia.  No significant change from EKG 11/30/2017  Assessment:     ICD-10-CM   1. Essential hypertension  I10 EKG 12-Lead    metoprolol (TOPROL-XL) 100 MG 24 hr tablet  2. Paroxysmal atrial fibrillation ([place]). CHA2DS2-VASc Score is 4.  Yearly risk of stroke: 4% (A, F, HTN).     I48.0   3. Sinus node dysfunction ([facility])  I49.5   4. Pacemaker Dual chamber Medtronic Azure MRI  using His bundle pacing  Z95.0   5. Stenosis of prosthetic aortic valve, subsequent encounter  T82.857D     Recommendations:   [name]  is a 80 y.o. female  with prior history of severe aortic regurgitation and moderately severe mitral regurgitation. She underwent aortic pericardial tissue valve replacement and mitral valve ring placement on 08/12/2011 with bioprosthetic valves by Dr. Gilford Raid. Since then her ejection fraction is normalized.    She also has sick sinus syndrome and complete heart block needing permanent pacemaker implantation on 12/21/2016 with Medtronic dual-chamber pacemaker. She has had one episode of sustained A. Fib during office pacemaker check on 10/31/18. 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    Patient presents for 35-monthfollow-up.  She is presently doing well, there are no clinical signs of heart failure.  Blood pressure is well controlled and she is tolerating Eliquis without bleeding diathesis.  We will not make changes to her cardiovascular medications at this time.  In regard to hyperlipidemia, PCP has recently started patient on statin therapy 3 days/week with plan to repeat lipid profile testing in 3 months.  We will defer further management of hyperlipidemia to PCP at this time.  In regard to valvular disease, patient's physical exam remains stable will  consider repeat echocardiogram at follow-up visit.  Reviewed and discussed with patient regarding data from her recent pacemaker transmission, pacemaker function is normal without significant abnormality.  I have refilled her metoprolol today.  Patient remained stable from a cardiovascular standpoint.  Follow-up in 6 months, sooner if needed, for valvular disease, sick sinus syndrome, hypertension, and hyperlipidemia.  Notably patient prefers to follow-up with Dr. GEinar Gip therefore we will schedule next office visit with him.   CAlethia Berthold PA-C 04/30/2020, 3:48 PM Office: [phone]

## 2020-05-14 ENCOUNTER — Other Ambulatory Visit: Payer: Self-pay

## 2020-05-14 DIAGNOSIS — I10 Essential (primary) hypertension: Secondary | ICD-10-CM

## 2020-05-14 MED ORDER — METOPROLOL SUCCINATE ER 100 MG PO TB24: 100.0000 mg | ORAL_TABLET | Freq: Every day | ORAL | 3 refills | Status: DC

## 2020-05-19 DIAGNOSIS — M810 Age-related osteoporosis without current pathological fracture: Secondary | ICD-10-CM | POA: Diagnosis not present

## 2020-05-19 DIAGNOSIS — I129 Hypertensive chronic kidney disease with stage 1 through stage 4 chronic kidney disease, or unspecified chronic kidney disease: Secondary | ICD-10-CM | POA: Diagnosis not present

## 2020-05-19 DIAGNOSIS — N1831 Chronic kidney disease, stage 3a: Secondary | ICD-10-CM | POA: Diagnosis not present

## 2020-05-19 DIAGNOSIS — N183 Chronic kidney disease, stage 3 unspecified: Secondary | ICD-10-CM | POA: Diagnosis not present

## 2020-05-19 DIAGNOSIS — I10 Essential (primary) hypertension: Secondary | ICD-10-CM | POA: Diagnosis not present

## 2020-05-19 DIAGNOSIS — I480 Paroxysmal atrial fibrillation: Secondary | ICD-10-CM | POA: Diagnosis not present

## 2020-05-19 DIAGNOSIS — M1612 Unilateral primary osteoarthritis, left hip: Secondary | ICD-10-CM | POA: Diagnosis not present

## 2020-06-02 DIAGNOSIS — M810 Age-related osteoporosis without current pathological fracture: Secondary | ICD-10-CM | POA: Diagnosis not present

## 2020-06-02 DIAGNOSIS — M1612 Unilateral primary osteoarthritis, left hip: Secondary | ICD-10-CM | POA: Diagnosis not present

## 2020-06-02 DIAGNOSIS — I129 Hypertensive chronic kidney disease with stage 1 through stage 4 chronic kidney disease, or unspecified chronic kidney disease: Secondary | ICD-10-CM | POA: Diagnosis not present

## 2020-06-02 DIAGNOSIS — I480 Paroxysmal atrial fibrillation: Secondary | ICD-10-CM | POA: Diagnosis not present

## 2020-06-02 DIAGNOSIS — I10 Essential (primary) hypertension: Secondary | ICD-10-CM | POA: Diagnosis not present

## 2020-06-03 DIAGNOSIS — Z950 Presence of cardiac pacemaker: Secondary | ICD-10-CM | POA: Diagnosis not present

## 2020-06-03 DIAGNOSIS — Z45018 Encounter for adjustment and management of other part of cardiac pacemaker: Secondary | ICD-10-CM | POA: Diagnosis not present

## 2020-06-03 DIAGNOSIS — Z45010 Encounter for checking and testing of cardiac pacemaker pulse generator [battery]: Secondary | ICD-10-CM | POA: Diagnosis not present

## 2020-06-03 DIAGNOSIS — I495 Sick sinus syndrome: Secondary | ICD-10-CM | POA: Diagnosis not present

## 2020-06-07 DIAGNOSIS — I480 Paroxysmal atrial fibrillation: Secondary | ICD-10-CM | POA: Diagnosis not present

## 2020-06-07 DIAGNOSIS — M1612 Unilateral primary osteoarthritis, left hip: Secondary | ICD-10-CM | POA: Diagnosis not present

## 2020-06-07 DIAGNOSIS — I10 Essential (primary) hypertension: Secondary | ICD-10-CM | POA: Diagnosis not present

## 2020-06-07 DIAGNOSIS — N1831 Chronic kidney disease, stage 3a: Secondary | ICD-10-CM | POA: Diagnosis not present

## 2020-06-07 DIAGNOSIS — M810 Age-related osteoporosis without current pathological fracture: Secondary | ICD-10-CM | POA: Diagnosis not present

## 2020-06-07 DIAGNOSIS — N183 Chronic kidney disease, stage 3 unspecified: Secondary | ICD-10-CM | POA: Diagnosis not present

## 2020-06-07 DIAGNOSIS — I129 Hypertensive chronic kidney disease with stage 1 through stage 4 chronic kidney disease, or unspecified chronic kidney disease: Secondary | ICD-10-CM | POA: Diagnosis not present

## 2020-08-10 DIAGNOSIS — M1612 Unilateral primary osteoarthritis, left hip: Secondary | ICD-10-CM | POA: Diagnosis not present

## 2020-08-10 DIAGNOSIS — M810 Age-related osteoporosis without current pathological fracture: Secondary | ICD-10-CM | POA: Diagnosis not present

## 2020-08-10 DIAGNOSIS — E785 Hyperlipidemia, unspecified: Secondary | ICD-10-CM | POA: Diagnosis not present

## 2020-08-10 DIAGNOSIS — I10 Essential (primary) hypertension: Secondary | ICD-10-CM | POA: Diagnosis not present

## 2020-08-10 DIAGNOSIS — I480 Paroxysmal atrial fibrillation: Secondary | ICD-10-CM | POA: Diagnosis not present

## 2020-08-10 DIAGNOSIS — N1831 Chronic kidney disease, stage 3a: Secondary | ICD-10-CM | POA: Diagnosis not present

## 2020-09-02 DIAGNOSIS — I495 Sick sinus syndrome: Secondary | ICD-10-CM | POA: Diagnosis not present

## 2020-09-02 DIAGNOSIS — Z950 Presence of cardiac pacemaker: Secondary | ICD-10-CM | POA: Diagnosis not present

## 2020-09-02 DIAGNOSIS — Z45018 Encounter for adjustment and management of other part of cardiac pacemaker: Secondary | ICD-10-CM | POA: Diagnosis not present

## 2020-09-09 DIAGNOSIS — Z0000 Encounter for general adult medical examination without abnormal findings: Secondary | ICD-10-CM | POA: Diagnosis not present

## 2020-09-10 DIAGNOSIS — Z952 Presence of prosthetic heart valve: Secondary | ICD-10-CM | POA: Diagnosis not present

## 2020-09-10 DIAGNOSIS — N183 Chronic kidney disease, stage 3 unspecified: Secondary | ICD-10-CM | POA: Diagnosis not present

## 2020-09-10 DIAGNOSIS — M1612 Unilateral primary osteoarthritis, left hip: Secondary | ICD-10-CM | POA: Diagnosis not present

## 2020-09-10 DIAGNOSIS — I739 Peripheral vascular disease, unspecified: Secondary | ICD-10-CM | POA: Diagnosis not present

## 2020-09-10 DIAGNOSIS — Z950 Presence of cardiac pacemaker: Secondary | ICD-10-CM | POA: Diagnosis not present

## 2020-09-10 DIAGNOSIS — I480 Paroxysmal atrial fibrillation: Secondary | ICD-10-CM | POA: Diagnosis not present

## 2020-09-10 DIAGNOSIS — I10 Essential (primary) hypertension: Secondary | ICD-10-CM | POA: Diagnosis not present

## 2020-09-10 DIAGNOSIS — E559 Vitamin D deficiency, unspecified: Secondary | ICD-10-CM | POA: Diagnosis not present

## 2020-09-10 DIAGNOSIS — N1831 Chronic kidney disease, stage 3a: Secondary | ICD-10-CM | POA: Diagnosis not present

## 2020-10-29 DIAGNOSIS — N183 Chronic kidney disease, stage 3 unspecified: Secondary | ICD-10-CM | POA: Diagnosis not present

## 2020-10-29 DIAGNOSIS — I480 Paroxysmal atrial fibrillation: Secondary | ICD-10-CM | POA: Diagnosis not present

## 2020-10-29 DIAGNOSIS — E785 Hyperlipidemia, unspecified: Secondary | ICD-10-CM | POA: Diagnosis not present

## 2020-10-29 DIAGNOSIS — N1831 Chronic kidney disease, stage 3a: Secondary | ICD-10-CM | POA: Diagnosis not present

## 2020-10-29 DIAGNOSIS — M1612 Unilateral primary osteoarthritis, left hip: Secondary | ICD-10-CM | POA: Diagnosis not present

## 2020-10-29 DIAGNOSIS — M810 Age-related osteoporosis without current pathological fracture: Secondary | ICD-10-CM | POA: Diagnosis not present

## 2020-10-29 DIAGNOSIS — I129 Hypertensive chronic kidney disease with stage 1 through stage 4 chronic kidney disease, or unspecified chronic kidney disease: Secondary | ICD-10-CM | POA: Diagnosis not present

## 2020-10-29 DIAGNOSIS — I10 Essential (primary) hypertension: Secondary | ICD-10-CM | POA: Diagnosis not present

## 2020-11-03 ENCOUNTER — Ambulatory Visit: Payer: Medicare Other | Admitting: Cardiology

## 2020-11-10 ENCOUNTER — Encounter: Payer: Self-pay | Admitting: Cardiology

## 2020-11-10 ENCOUNTER — Other Ambulatory Visit: Payer: Self-pay

## 2020-11-10 ENCOUNTER — Ambulatory Visit: Payer: Medicare Other | Admitting: Cardiology

## 2020-11-10 VITALS — BP 134/82 | HR 70 | Temp 97.6°F | Resp 16 | Ht 64.0 in | Wt 160.0 lb

## 2020-11-10 DIAGNOSIS — Z298 Encounter for other specified prophylactic measures: Secondary | ICD-10-CM | POA: Diagnosis not present

## 2020-11-10 DIAGNOSIS — T82857D Stenosis of cardiac prosthetic devices, implants and grafts, subsequent encounter: Secondary | ICD-10-CM

## 2020-11-10 DIAGNOSIS — Z950 Presence of cardiac pacemaker: Secondary | ICD-10-CM | POA: Diagnosis not present

## 2020-11-10 DIAGNOSIS — I495 Sick sinus syndrome: Secondary | ICD-10-CM | POA: Diagnosis not present

## 2020-11-10 DIAGNOSIS — I480 Paroxysmal atrial fibrillation: Secondary | ICD-10-CM | POA: Diagnosis not present

## 2020-11-10 NOTE — Progress Notes (Signed)
Primary Physician:  Vernie Shanks, MD   Patient ID: [name], female    DOB: [birth year], 80 y.o.   MRN: [id number]  Subjective:    Chief Complaint  Patient presents with   Follow-up    6 months     HPI: [name]  is a 80 y.o. female female  with prior history of severe aortic regurgitation and moderately severe mitral regurgitation. She underwent aortic pericardial tissue valve replacement  and mitral valve ring placement on 08/12/2011 with bioprosthetic valves by Dr. Gilford Raid. Since then her ejection fraction is normalized.    She also has sick sinus syndrome and complete heart block needing permanent pacemaker implantation on 12/21/2016 with Medtronic dual-chamber pacemaker. She has had one episode of sustained A. Fib during office pacemaker check on 10/31/18. Initially felt to have severe aortic stenosis due to degenerative aortic valve however there was a mismatch between echo and cardiac catheterization findings and CT scan.  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 twice daily, now increased to 5 mg BID due to PAF.   Patient presents for 44-monthfollow-up.  She is presently doing well, without specific complaints.  She is tolerating medications without issue and continues to walk daily for approximately 50 minutes without issue.  Patient reports her PCP started her on Crestor last month 3 days/week with plan to recheck lipid profile testing in April.  She is tolerating Eliquis without bleeding diathesis.  Patient reports home blood pressure monitoring with readings that are under excellent control.  Denies chest pain, palpitations, syncope, near syncope, orthopnea, PND, dyspnea.   Past Medical History:  Diagnosis Date   Aortic stenosis    2013   Arthritis    "hips" (12/21/2016)   Bioprosthetic 28 mm AV and 21 mm MV 10/11/2016   Chronic  diastolic CHF (congestive heart failure) (HCC)    Chronic kidney disease    Complication of anesthesia    after hip surgery 06-2016 bp and heart rate dropped pt. has had a pacemaker placed since this event.   Encounter for care of pacemaker 09/07/2018   H/O atrial fibrillation without current medication 06/06/2018   2013 post AV and MV replacement and no recurrence. On Eliquis for AV thrombotic degeneration   History of blood transfusion 05/2016   "when I had my hip replaced" (12/21/2016)   Hypertension    [place]ox[place] 06/06/2018   2013 post AV and MV replacement and no recurrence. On Eliquis for AV thrombotic degeneration   Presence of permanent cardiac pacemaker    medtronic   Shortness of breath    hx dyspnea and respiratory abnormalities   Sinus node dysfunction ([place] 12/21/2016   Family History  Problem Relation Age of Onset   Hypertension Mother    Hypertension Sister    Hypertension Sister    Hypertension Sister      Past Surgical History:  Procedure Laterality Date   AORTIC VALVE REPLACEMENT  10/12/2011   Procedure: AORTIC VALVE REPLACEMENT (AVR);  Surgeon: BGaye Pollack MD;  [facility] [facility]  Service: Open Heart Surgery;  Laterality: N/A;   CARDIAC CATHETERIZATION     "I've had several; no OR" (12/21/2016)   CARDIAC VALVE REPLACEMENT     CARDIOVERSION  12/26/2011   Procedure: CARDIOVERSION;  Surgeon: JLaverda Page MD;  [place] [facility]  Service: Cardiovascular;  Laterality: N/A;  CORONARY ANGIOGRAPHY N/A 09/26/2016   Procedure: CORONARY ANGIOGRAPHY;  Surgeon: Nigel Mormon, MD;  Location: [place] [facility];  Service: Cardiovascular;  Laterality: N/A;   EXPLORATION POST OPERATIVE OPEN HEART  10/12/2011   Procedure: EXPLORATION POST OPERATIVE OPEN HEART;  Surgeon: Gaye Pollack, MD;  Location: [place] OR;  Service: Open Heart Surgery;  Laterality: N/A;   INSERT / REPLACE / REMOVE PACEMAKER  12/21/2016   JOINT REPLACEMENT     right hip  06-2016 Left hip 02-08-17 Dr. Lyla Glassing   LEFT AND RIGHT HEART CATHETERIZATION WITH CORONARY ANGIOGRAM N/A 09/11/2011   Procedure: LEFT AND RIGHT HEART CATHETERIZATION WITH CORONARY ANGIOGRAM;  Surgeon: Laverda Page, MD;  Location: [facility] CATH LAB;  Service: Cardiovascular;  Laterality: N/A;   MITRAL VALVE REPAIR  10/12/2011   Procedure: MITRAL VALVE REPAIR (MVR);  Surgeon: Gaye Pollack, MD;  Location: [place];  Service: Open Heart Surgery;  Laterality: N/A;  mitral valve annuloplasty   PACEMAKER IMPLANT N/A 12/21/2016   Procedure: PACEMAKER IMPLANT;  Surgeon: Evans Lance, MD;  Location: [place] [facility];  Service: Cardiovascular;  Laterality: N/A;   RIGHT HEART CATH N/A 09/26/2016   Procedure: RIGHT HEART CATH;  Surgeon: Nigel Mormon, MD;  Location: [place] [facility];  Service: Cardiovascular;  Laterality: N/A;   TEE WITHOUT CARDIOVERSION N/A 08/22/2016   Procedure: TRANSESOPHAGEAL ECHOCARDIOGRAM (TEE);  Surgeon: Adrian Prows, MD;  Location: [facility] ENDOSCOPY;  Service: Cardiovascular;  Laterality: N/A;   TEE WITHOUT CARDIOVERSION N/A 12/12/2016   Procedure: TRANSESOPHAGEAL ECHOCARDIOGRAM (TEE);  Surgeon: Dorothy Spark, MD;  Location: [facility] ENDOSCOPY;  Service: Cardiovascular;  Laterality: N/A;   TOTAL HIP ARTHROPLASTY Right 06/15/2016   Procedure: RIGHT TOTAL HIP ARTHROPLASTY ANTERIOR APPROACH;  Surgeon: Rod Can, MD;  Location: [facility];  Service: Orthopedics;  Laterality: Right;  Dr. needs RNFA   TOTAL HIP ARTHROPLASTY Left 02/08/2017   Procedure: LEFT TOTAL HIP ARTHROPLASTY ANTERIOR APPROACH;  Surgeon: Rod Can, MD;  Location: [facility];  Service: Orthopedics;  Laterality: Left;  Needs RNFA   Social History   Tobacco Use   Smoking status: Never   Smokeless tobacco: Never  Substance Use Topics   Alcohol use: No   Marital Status: Single   Review of Systems  Cardiovascular:  Negative for chest pain, dyspnea on exertion and leg swelling.  Gastrointestinal:  Negative for melena.   Objective:  Blood pressure 134/82, pulse 70, temperature 97.6 F (36.4 C), temperature source Temporal, resp. rate 16, height 5' 4" (1.626 m), weight 160 lb (72.6 kg), SpO2 96 %. Body mass index is 27.46 kg/m.   Vitals with BMI 11/10/2020 04/30/2020 04/30/2020  Height 5' 4" - 5' 4"  Weight 160 lbs - 163 lbs 10 oz  BMI 82.95 - 62.13  Systolic 086 578 469  Diastolic 82 81 96  Pulse 70 76 75      Physical Exam Vitals reviewed.  Constitutional:      General: She is not in acute distress.    Appearance: She is well-developed.  Neck:     Vascular: No JVD.  Cardiovascular:     Rate and Rhythm: Normal rate and regular rhythm.     Pulses: Normal pulses and intact distal pulses.          Carotid pulses are  on the right side with bruit and  on the left side with bruit.    Heart sounds: S1 normal and S2 normal. Murmur heard.  Harsh mid to late systolic murmur  is present with a grade of 3/6 at the upper right sternal border radiating to the neck.    No gallop.  Pulmonary:     Effort: Pulmonary effort is normal. No respiratory distress.     Breath sounds: Normal breath sounds. No wheezing, rhonchi or rales.  Abdominal:     General: Bowel sounds are normal.     Palpations: Abdomen is soft.  Musculoskeletal:     Cervical back: Neck supple.     Right lower leg: Edema (trace) present.     Left lower leg: Edema (trace) present.  Neurological:     Mental Status: She is alert.   Radiology: No results found.  Laboratory examination:    CMP Latest Ref Rng & Units 01/08/2020 09/03/2019 07/10/2019  Glucose 65 - 99 mg/dL 107(H) 85 103(H)  BUN 8 - 27 mg/dL _0 Creatinine 0.57 - 1.00 mg/dL 1.22(H) 1.40(H) 1.56(H)  Sodium 134 - 144 mmol/L 142 142 140  Potassium 3.5 - 5.2 mmol/L 4.5 4.6 5.0  Chloride 96 - 106 mmol/L 103 105 103  CO2 20 - 29 mmol/L _1 Calcium 8.7 - 10.3 mg/dL 10.1 10.1 10.6(H)  Total Protein 6.0 - 8.5 g/dL - - -  Total Bilirubin 0.0 - 1.2 mg/dL - - -  Alkaline  Phos 39 - 117 IU/L - - -  AST 0 - 40 IU/L - - -  ALT 0 - 32 IU/L - - -   CBC Latest Ref Rng & Units 06/19/2019 05/09/2018 04/18/2017  WBC 3.4 - 10.8 x10E3/uL 5.6 5.1 6.3  Hemoglobin 11.1 - 15.9 g/dL 12.7 13.9 12.2  Hematocrit 34.0 - 46.6 % 39.0 44.3 36.2  Platelets 150 - 450 x10E3/uL 144(L) 97(L) 159   Lipid Panel     Component Value Date/Time   CHOL 141 08/22/2011 0635   TRIG 72 08/22/2011 0635   HDL 67 08/22/2011 0635   CHOLHDL 2.1 08/22/2011 0635   VLDL 14 08/22/2011 0635   LDLCALC 60 08/22/2011 0635   HEMOGLOBIN A1C Lab Results  Component Value Date   HGBA1C 6.3 (H) 10/10/2011   MPG 134 (H) 10/10/2011   TSH No results for input(s): TSH in the last 8760 hours.  PRN Meds:. Medications Discontinued During This Encounter  Medication Reason   ibandronate (BONIVA) 150 MG tablet Error    Current Meds  Medication Sig   alendronate (FOSAMAX) 70 MG tablet Take 70 mg by mouth once a week.   amLODipine (NORVASC) 5 MG tablet Take 1 tablet (5 mg total) by mouth daily.   Ascorbic Acid (VITAMIN C) 500 MG CAPS Take 1 capsule by mouth daily.   Cholecalciferol (VITAMIN D3) 125 MCG (5000 UT) CAPS Take 1 capsule by mouth daily.   ELIQUIS 5 MG TABS tablet TAKE 1 TABLET BY MOUTH  TWICE DAILY   metoprolol succinate (TOPROL-XL) 100 MG 24 hr tablet Take 1 tablet (100 mg total) by mouth daily. Take with or immediately following a meal.   Multiple Vitamin (MULTIVITAMIN WITH MINERALS) TABS tablet Take 1 tablet by mouth daily.   rosuvastatin (CRESTOR) 10 MG tablet Take 10 mg by mouth 3 (three) times a week.   External labs:   Cholesterol, total 175.000 m 04/05/2020 HDL 70.000 mg 04/05/2020 LDL 91.000 mg 04/05/2020 Triglycerides 70.000 mg 04/05/2020  A1C 6.100 % 08/05/2019 TSH 0.800 04/05/2020  Hemoglobin 12.300 g/d 09/10/2020  Creatinine, Serum 1.220 mg/ 09/10/2020 Potassium 4.800 mm 09/10/2020 ALT (SGPT) 17.000 U/L 09/10/2020  Lab 08/05/2019:  A1c 6.1%.  Hb 12.9/HCT 39.2, platelets  147.  Serum glucose 85 mg, BUN 17, creatinine 1.36, EGFR 45 mL.  Potassium 4.7.  CMP otherwise normal.  Total cholesterol 156, triglycerides 62, HDL 56, LDL 86.  Non-HDL cholesterol 98.  Cardiac Studies:   Valve replacement Operative Report 10/12/11: 28 mm AV and MV 21 mm Edwards pericardial valve replacement for severe AI and mod MR by Dr. Cyndia Bent, Cyndia Bent.  Coronary angiogram 09/26/2016 Normal coronary arteries. Mild to Moderate pulmonary hypertension. CO 3.99, CI 2.42   Pacemaker implantation 12/21/2016 with Medtronic Azure MRI dual chamber pacemaker using His Bundle Pacing  by Cristopher Peru, MD  Carotid artery duplex 05/09/2018  1. Mild amount of plaque at the level of the right carotid bulb and ICA origin. Estimated right ICA stenosis is less than 50%.  2. No evidence of left ICA stenosis. 3. Tortuosity of bilateral internal carotid arteries consistent with longstanding hypertension.  Echocardiogram 10/14/2018: Left ventricle cavity is normal in size. Mild concentric hypertrophy of the left ventricle. Normal LV systolic function with EF 66%. Abnormal septal wall motion due to right ventricle pacemaker. Diastolic function not assessed due to paced rhythm. Calculated EF 66%. Left atrial cavity is moderately dilated. Aneurysmal interatrial septum without 2D or color Doppler evidence of interatrial shunt. Bioprosthetic aortic valve with thickening of leaflets. Cannot exclude thrombus. Moderate aortic valve stenosis. Aortic valve mean gradient of 24 mmHg, Vmax of 3.2  m/s. Calculated aortic valve area by continuity equation is 1.1 cm. Trace aortic regurgitation.  Moderate tricuspid regurgitation. Estimated pulmonary artery systolic pressure is 28 mmHg. 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  Device Clinic: Pacemaker Dual chamber Medtronic Azure MRI  using His bundle   Remote pacemaker transmission 09/01/2020: Longevity 8 years and 2 months.  AP 97%, VP 68%. There were no high ventricular rate episodes or mode switches. Normal pacemaker function.  EKG   EKG 11/10/2020: Atrially paced rhythm with first-degree AV block at a rate of 70 bpm.  Left axis deviation, left anterior fascicular block.  Occasional PVCs (2).  LVH with repolarization abnormality.  No significant change from 04/30/2020.  Assessment:     ICD-10-CM   1. Paroxysmal atrial fibrillation ([place]). CHA2DS2-VASc Score is 4.  Yearly risk of stroke: 4% (A, F, HTN).     I48.0 EKG 12-Lead    2. Sinus node dysfunction (HCC)  I49.5     3. Pacemaker Dual chamber Medtronic Azure MRI  using His bundle pacing  Z95.0     4. Stenosis of prosthetic aortic valve, subsequent encounter  T82.857D PCV ECHOCARDIOGRAM COMPLETE    5. Indication present for endocarditis prophylaxis  Z29.8       Recommendations:   [name]  is a 80 y.o. female  with prior history of severe aortic regurgitation and moderately severe mitral regurgitation. She underwent aortic pericardial tissue valve replacement  and mitral valve ring placement on 08/12/2011 with bioprosthetic valves by Dr. Gilford Raid. Since then her ejection fraction is normalized.    She also has sick sinus syndrome and complete heart block needing permanent pacemaker implantation on 12/21/2016 with Medtronic dual-chamber pacemaker. She has had one episode of sustained A. Fib during office pacemaker check on 10/31/18. 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    Patient presents for 79-monthfollow-up.  She is presently doing well, there are no  clinical signs of heart failure.  Blood pressure is well controlled and she is tolerating Eliquis without bleeding diathesis.  We will not make changes to her cardiovascular medications at this time.    I would like to repeat echocardiogram as it has been >2  years since last echocardiogram.  We will follow-up on the prosthetic aortic valve dysfunction.  She has not had any recurrence of atrial fibrillation except for one episode on 10/31/2018.  The question is whether we could discontinue Eliquis.  If the valve looks better, we may consider this but will discuss with structural heart disease team.  Lipids are well controlled, I reviewed external labs, no changes were done today, I will see her back in 6 months.     Adrian Prows, PA-C 11/10/2020, 11:45 AM Office: [phone]

## 2020-11-30 ENCOUNTER — Ambulatory Visit: Payer: Medicare Other | Admitting: Cardiology

## 2020-11-30 ENCOUNTER — Ambulatory Visit: Payer: Medicare Other

## 2020-11-30 ENCOUNTER — Other Ambulatory Visit: Payer: Self-pay

## 2020-11-30 DIAGNOSIS — Z950 Presence of cardiac pacemaker: Secondary | ICD-10-CM

## 2020-11-30 DIAGNOSIS — T82857D Stenosis of cardiac prosthetic devices, implants and grafts, subsequent encounter: Secondary | ICD-10-CM | POA: Diagnosis not present

## 2020-11-30 DIAGNOSIS — Z45018 Encounter for adjustment and management of other part of cardiac pacemaker: Secondary | ICD-10-CM

## 2020-11-30 DIAGNOSIS — I495 Sick sinus syndrome: Secondary | ICD-10-CM

## 2020-11-30 NOTE — Progress Notes (Signed)
Chief Complaint  Patient presents with   Pacemaker Check   Scheduled  In office pacemaker check 11/30/20  Single (S)/Dual (D)/BV: D. Presenting APVS. Pacemaker dependant:  No. Underlying Sinus arrest and VS. AP 97.3%, VP 48.8%.  AMS Episodes: .  AT/AF burden <0.1% . No EGM. HVR ) since July 2022  Longevity 7.7 Years. Magnet rate: >85%. Lead measurements: Stable. Histogram: Low (L)/normal (N)/high (H)  Low. Patient activity Low.   Observations: Normal pacemaker function. Changes: None   Mindy Decamp, MD, [facility] 11/30/2020, 3:32 PM Office: [phone] Fax: [phone] Pager: [phone] .

## 2020-12-01 DIAGNOSIS — Z950 Presence of cardiac pacemaker: Secondary | ICD-10-CM | POA: Diagnosis not present

## 2020-12-01 DIAGNOSIS — I495 Sick sinus syndrome: Secondary | ICD-10-CM | POA: Diagnosis not present

## 2020-12-01 DIAGNOSIS — Z45018 Encounter for adjustment and management of other part of cardiac pacemaker: Secondary | ICD-10-CM | POA: Diagnosis not present

## 2020-12-05 NOTE — Progress Notes (Signed)
Stable valve gradients. Continue observation

## 2020-12-05 NOTE — Progress Notes (Signed)
Let her know the valve disease is stable from 2020. Will continue to observe

## 2020-12-06 NOTE — Progress Notes (Signed)
Called and spoke with patient regarding her echocardiogram results.

## 2020-12-13 ENCOUNTER — Other Ambulatory Visit: Payer: Medicare Other

## 2021-01-11 DIAGNOSIS — Z23 Encounter for immunization: Secondary | ICD-10-CM | POA: Diagnosis not present

## 2021-01-11 DIAGNOSIS — E559 Vitamin D deficiency, unspecified: Secondary | ICD-10-CM | POA: Diagnosis not present

## 2021-01-11 DIAGNOSIS — M1612 Unilateral primary osteoarthritis, left hip: Secondary | ICD-10-CM | POA: Diagnosis not present

## 2021-01-11 DIAGNOSIS — E785 Hyperlipidemia, unspecified: Secondary | ICD-10-CM | POA: Diagnosis not present

## 2021-01-11 DIAGNOSIS — I739 Peripheral vascular disease, unspecified: Secondary | ICD-10-CM | POA: Diagnosis not present

## 2021-01-11 DIAGNOSIS — Z950 Presence of cardiac pacemaker: Secondary | ICD-10-CM | POA: Diagnosis not present

## 2021-01-11 DIAGNOSIS — I10 Essential (primary) hypertension: Secondary | ICD-10-CM | POA: Diagnosis not present

## 2021-01-11 DIAGNOSIS — N1831 Chronic kidney disease, stage 3a: Secondary | ICD-10-CM | POA: Diagnosis not present

## 2021-01-11 DIAGNOSIS — I129 Hypertensive chronic kidney disease with stage 1 through stage 4 chronic kidney disease, or unspecified chronic kidney disease: Secondary | ICD-10-CM | POA: Diagnosis not present

## 2021-01-11 DIAGNOSIS — R7303 Prediabetes: Secondary | ICD-10-CM | POA: Diagnosis not present

## 2021-01-11 DIAGNOSIS — M810 Age-related osteoporosis without current pathological fracture: Secondary | ICD-10-CM | POA: Diagnosis not present

## 2021-01-11 DIAGNOSIS — I480 Paroxysmal atrial fibrillation: Secondary | ICD-10-CM | POA: Diagnosis not present

## 2021-02-08 ENCOUNTER — Other Ambulatory Visit: Payer: Self-pay | Admitting: Cardiology

## 2021-03-02 DIAGNOSIS — Z45018 Encounter for adjustment and management of other part of cardiac pacemaker: Secondary | ICD-10-CM | POA: Diagnosis not present

## 2021-03-02 DIAGNOSIS — I495 Sick sinus syndrome: Secondary | ICD-10-CM | POA: Diagnosis not present

## 2021-04-09 ENCOUNTER — Other Ambulatory Visit: Payer: Self-pay | Admitting: Cardiology

## 2021-04-09 DIAGNOSIS — I10 Essential (primary) hypertension: Secondary | ICD-10-CM

## 2021-05-10 DIAGNOSIS — N1831 Chronic kidney disease, stage 3a: Secondary | ICD-10-CM | POA: Diagnosis not present

## 2021-05-10 DIAGNOSIS — E785 Hyperlipidemia, unspecified: Secondary | ICD-10-CM | POA: Diagnosis not present

## 2021-05-10 DIAGNOSIS — I129 Hypertensive chronic kidney disease with stage 1 through stage 4 chronic kidney disease, or unspecified chronic kidney disease: Secondary | ICD-10-CM | POA: Diagnosis not present

## 2021-05-10 DIAGNOSIS — M1612 Unilateral primary osteoarthritis, left hip: Secondary | ICD-10-CM | POA: Diagnosis not present

## 2021-05-10 DIAGNOSIS — I480 Paroxysmal atrial fibrillation: Secondary | ICD-10-CM | POA: Diagnosis not present

## 2021-05-10 DIAGNOSIS — E559 Vitamin D deficiency, unspecified: Secondary | ICD-10-CM | POA: Diagnosis not present

## 2021-05-10 DIAGNOSIS — I10 Essential (primary) hypertension: Secondary | ICD-10-CM | POA: Diagnosis not present

## 2021-05-10 DIAGNOSIS — R7303 Prediabetes: Secondary | ICD-10-CM | POA: Diagnosis not present

## 2021-05-10 DIAGNOSIS — M810 Age-related osteoporosis without current pathological fracture: Secondary | ICD-10-CM | POA: Diagnosis not present

## 2021-05-10 DIAGNOSIS — I739 Peripheral vascular disease, unspecified: Secondary | ICD-10-CM | POA: Diagnosis not present

## 2021-05-10 DIAGNOSIS — Z950 Presence of cardiac pacemaker: Secondary | ICD-10-CM | POA: Diagnosis not present

## 2021-05-11 ENCOUNTER — Other Ambulatory Visit: Payer: Self-pay

## 2021-05-11 ENCOUNTER — Encounter: Payer: Self-pay | Admitting: Cardiology

## 2021-05-11 ENCOUNTER — Ambulatory Visit: Payer: Medicare Other | Admitting: Cardiology

## 2021-05-11 VITALS — BP 137/87 | HR 83 | Temp 98.2°F | Resp 16 | Ht 64.0 in | Wt 160.2 lb

## 2021-05-11 DIAGNOSIS — I495 Sick sinus syndrome: Secondary | ICD-10-CM

## 2021-05-11 DIAGNOSIS — T82857D Stenosis of cardiac prosthetic devices, implants and grafts, subsequent encounter: Secondary | ICD-10-CM | POA: Diagnosis not present

## 2021-05-11 DIAGNOSIS — I480 Paroxysmal atrial fibrillation: Secondary | ICD-10-CM

## 2021-05-11 DIAGNOSIS — Z952 Presence of prosthetic heart valve: Secondary | ICD-10-CM | POA: Diagnosis not present

## 2021-05-11 DIAGNOSIS — I10 Essential (primary) hypertension: Secondary | ICD-10-CM

## 2021-05-11 NOTE — Progress Notes (Signed)
? ?Primary Physician:  Vernie Shanks, MD ? ? ?Patient ID: [name], female    DOB: December 04, 1940, 81 y.o.   MRN: [id number] ? ?Subjective:  ? ? ?Chief Complaint  ?Patient presents with  ? Prosthetic aortic valve dysfunction  ? Hypertension  ? Follow-up  ?  6 months  ? ? ? ?HPI: [name]  is a 81 y.o. female female  with prior history of severe aortic regurgitation and moderately severe mitral regurgitation. She underwent aortic pericardial tissue valve replacement  and mitral valve ring placement on 08/12/2011 with bioprosthetic valves by Dr. Gilford Raid. Since then her ejection fraction is normalized.   ? ?She also has sick sinus syndrome and complete heart block needing permanent pacemaker implantation on 12/21/2016 with Medtronic dual-chamber pacemaker. She has had one episode of sustained A. Fib during office pacemaker check on 10/31/18.  She has prosthetic aortic valve degeneration was fibrotic and thrombotic and long-term anticoagulation was recommended and hence patient presently on Eliquis 2.5 mg p.o. twice daily, now increased to 5 mg BID due to PAF.  ? ?Patient presents for 46-monthfollow-up.  She is presently doing well, without specific complaints.   ? ?Past Medical History:  ?Diagnosis Date  ? Aortic stenosis   ? 2013  ? Arthritis   ? "hips" (12/21/2016)  ? Bioprosthetic 28 mm AV and 21 mm MV 10/11/2016  ? Chronic diastolic CHF ([place]estive heart failure) [place]   ? Chronic kidney disease   ? Complication of anesthesia   ? after hip surgery 06-2016 bp and heart rate dropped pt. has had a pacemaker placed since this event.  ? Encounter for care of pacemaker 09/07/2018  ? H/O atrial fibrillation without current medication 06/06/2018  ? 2013 post AV and MV replacement and no recurrence. On Eliquis for AV thrombotic degeneration  ? History of blood transfusion 05/2016  ? "when I had my hip replaced" (12/21/2016)  ? Hypertension   ? Paroxysmal atrial fibrillation ([place] 06/06/2018  ? 2013 post AV and MV  replacement and no recurrence. On Eliquis for AV thrombotic degeneration  ? Presence of permanent cardiac pacemaker   ? medtronic  ? Shortness of breath   ? hx dyspnea and respiratory abnormalities  ? [place]us node dysfunction ([place] 12/21/2016  ? ?Family History  ?Problem Relation Age of Onset  ? Hypertension Mother   ? Hypertension Sister   ? Hypertension Sister   ? Hypertension Sister   ? ? ?Social History  ? ?Tobacco Use  ? Smoking status: Never  ? Smokeless tobacco: Never  ?Substance Use Topics  ? Alcohol use: No  ? Marital Status: Single  ? ?Review of Systems  ?Cardiovascular:  Negative for chest pain, dyspnea on exertion and leg swelling.  ?Gastrointestinal:  Negative for melena.  ?Objective:  ?Blood pressure 137/87, pulse 83, temperature 98.2 ?F (36.8 ?C), temperature source Temporal, resp. rate 16, height '5\' 4"'  (1.626 m), weight 160 lb 3.2 oz (72.7 kg), SpO2 96 %. Body mass index is 27.5 kg/m?.  ? ? ?  05/11/2021  ? 11:38 AM 11/10/2020  ? 11:26 AM 04/30/2020  ? 10:46 AM  ?Vitals with BMI  ?Height '5\' 4"'  '5\' 4"'    ?Weight 160 lbs 3 oz 160 lbs   ?BMI 27.48 27.45   ?Systolic [id number] ?Diastolic 87 82 81  ?Pulse 83 70 76  ?    ?Physical Exam ?Constitutional:   ?   General: She is not in acute distress. ?  Appearance: She is well-developed.  ?Neck:  ?   Vascular: No carotid bruit or JVD.  ?Cardiovascular:  ?   Rate and Rhythm: Normal rate and regular rhythm.  ?   Pulses: Normal pulses and intact distal pulses.     ?     Carotid pulses are  on the right side with bruit and  on the left side with bruit. ?   Heart sounds: S1 normal and S2 normal. Murmur heard.  ?Harsh mid to late systolic murmur is present with a grade of 3/6 at the upper right sternal border radiating to the neck.  ?  No gallop.  ?Pulmonary:  ?   Effort: Pulmonary effort is normal. No respiratory distress.  ?   Breath sounds: Normal breath sounds. No wheezing, rhonchi or rales.  ?Abdominal:  ?   General: Bowel sounds are normal.  ?   Palpations: Abdomen  is soft.  ?Musculoskeletal:  ?   Right lower leg: No edema.  ?   Left lower leg: No edema.  ? ?Radiology: ?No results found. ? ?Laboratory examination:  ? ?PRN Meds:. ?Medications Discontinued During This Encounter  ?Medication Reason  ? amLODipine (NORVASC) 5 MG tablet   ? ?Current Outpatient Medications:  ?  alendronate (FOSAMAX) 70 MG tablet, Take 70 mg by mouth once a week., Disp: , Rfl:  ?  amLODipine (NORVASC) 5 MG tablet, Take 1 tablet (5 mg total) by mouth daily., Disp: 90 tablet, Rfl: 3 ?  Ascorbic Acid (VITAMIN C) 500 MG CAPS, Take 1 capsule by mouth daily., Disp: , Rfl:  ?  Cholecalciferol (VITAMIN D3) 125 MCG (5000 UT) CAPS, Take 1 capsule by mouth daily., Disp: , Rfl:  ?  ELIQUIS 5 MG TABS tablet, TAKE 1 TABLET BY MOUTH  TWICE DAILY, Disp: 180 tablet, Rfl: 3 ?  metoprolol succinate (TOPROL-XL) 100 MG 24 hr tablet, TAKE 1 TABLET BY MOUTH  DAILY WITH OR IMMEDIATELY  FOLLOWING A MEAL, Disp: 90 tablet, Rfl: 3 ?  Multiple Vitamin (MULTIVITAMIN WITH MINERALS) TABS tablet, Take 1 tablet by mouth daily., Disp: , Rfl:  ?  rosuvastatin (CRESTOR) 10 MG tablet, Take 10 mg by mouth 3 (three) times a week., Disp: , Rfl:   ? ?External labs: ? ?Labs done 01/09/2021: ? ?Hb 12.3/HCT 35.2, platelets 109.  Normal indicis. ? ?Serum glucose 90 mg, BUN 17, creatinine 1.22, EGFR 45 mill, potassium 4.8, LFTs normal. ? ?Vitamin D 84.5.  TSH normal at 0.80.   ? ?Cholesterol, total 175.000 m 04/05/2020 ?HDL 70.000 mg 04/05/2020 ?LDL 91.000 mg 04/05/2020 ?Triglycerides 70.000 mg 04/05/2020 ? ?A1C 6.100 % 08/05/2019 ?TSH 0.800 04/05/2020 ? ?Cardiac Studies:  ? ?Valve replacement Operative Report 10/12/11: 28 mm AV and MV 21 mm Edwards pericardial valve replacement for severe AI and mod MR by Dr. Cyndia Bent, Cyndia Bent. ? ?Coronary angiogram 09/26/2016 Normal coronary arteries. Mild to Moderate pulmonary hypertension. CO 3.99, CI 2.42 ?  ?Carotid artery duplex 05/09/2018  ?1. Mild amount of plaque at the level of the right carotid bulb and ICA  origin. Estimated right ICA stenosis is less than 50%.  ?2. No evidence of left ICA stenosis. ?3. Tortuosity of bilateral internal carotid arteries consistent with longstanding hypertension. ? ?PCV ECHOCARDIOGRAM COMPLETE 11/30/2020 ? ?Narrative ?Echocardiogram 11/30/2020: ?Left ventricle cavity is normal in size. Moderate concentric hypertrophy of the left ventricle. Normal global wall motion. Normal LV systolic function with EF 59%. Diastolic function not assessed due to paced rhythm. ?Left atrial cavity is mildly dilated. ?S/p bioprosthetic 28 mm  aortic valve. Vmax 3.4 m.sec. DVI 0.37, acceleration time 130 msec, mean PG 26 mmHg. No regurgitation. Prosthetic valve or subvalvular narrowing cannnot be excluded. Consider TEE, if clinically indicated. ?Mild tricuspid regurgitation. Estimated pulmonary artery systolic pressure 30 mmHg. ?Mild pulmonic regurgitation. ?No significant change compared to previous study in 2020.  ? ?Device Clinic: Pacemaker Dual chamber Medtronic Azure MRI  using His bundle 12/21/2016  ? ?Scheduled  In office pacemaker check 11/30/20  ?Single (S)/Dual (D)/BV: D. ?Presenting APVS. ?Pacemaker dependant:  No. Underlying Sinus arrest and VS. AP 97.3%, VP 48.8%.  ?AMS Episodes: .  AT/AF burden <0.1% . No EGM. ?HVR ) since July 2022  ?Longevity 7.7 Years. Magnet rate: >85%. ?Lead measurements: Stable. ?Histogram: Low (L)/normal (N)/high (H)  Low. Patient activity Low.  ? ?Observations: Normal pacemaker function. Changes: None ? ?Remote pacemaker transmission 03/02/2021: ?AP 97%, VP 58%. Longevity 7 years and 7 months. Lead impedance and thresholds within normal limits. There were 2 high ventricular rate episodes, EGM = brief AT. AT/AF burden 0%. Normal pacemaker function. ? ?EKG  ? ?EKG 05/11/2021: AV paced rhythm.  No further analysis.  EKG 05/12/2018 ? ?EKG 11/10/2020: Atrially paced rhythm with first-degree AV block at a rate of 70 bpm.  Left axis deviation, left anterior fascicular block.   Occasional PVCs (2).  LVH with repolarization abnormality.  No significant change from 04/30/2020. ? ?Assessment:  ? ?  ICD-10-CM   ?1. Essential hypertension  I10 EKG 12-Lead  ?  ?2. Sinus node dysfunction (HCC)

## 2021-05-16 DIAGNOSIS — I480 Paroxysmal atrial fibrillation: Secondary | ICD-10-CM | POA: Diagnosis not present

## 2021-05-16 DIAGNOSIS — M810 Age-related osteoporosis without current pathological fracture: Secondary | ICD-10-CM | POA: Diagnosis not present

## 2021-05-16 DIAGNOSIS — I10 Essential (primary) hypertension: Secondary | ICD-10-CM | POA: Diagnosis not present

## 2021-05-16 DIAGNOSIS — E785 Hyperlipidemia, unspecified: Secondary | ICD-10-CM | POA: Diagnosis not present

## 2021-06-01 DIAGNOSIS — Z45018 Encounter for adjustment and management of other part of cardiac pacemaker: Secondary | ICD-10-CM | POA: Diagnosis not present

## 2021-06-01 DIAGNOSIS — I495 Sick sinus syndrome: Secondary | ICD-10-CM | POA: Diagnosis not present

## 2021-08-31 DIAGNOSIS — Z45018 Encounter for adjustment and management of other part of cardiac pacemaker: Secondary | ICD-10-CM | POA: Diagnosis not present

## 2021-08-31 DIAGNOSIS — I495 Sick sinus syndrome: Secondary | ICD-10-CM | POA: Diagnosis not present

## 2021-10-28 ENCOUNTER — Other Ambulatory Visit: Payer: Self-pay | Admitting: Cardiology

## 2021-11-30 DIAGNOSIS — I495 Sick sinus syndrome: Secondary | ICD-10-CM | POA: Diagnosis not present

## 2021-11-30 DIAGNOSIS — Z45018 Encounter for adjustment and management of other part of cardiac pacemaker: Secondary | ICD-10-CM | POA: Diagnosis not present

## 2021-12-30 ENCOUNTER — Other Ambulatory Visit: Payer: Self-pay | Admitting: Cardiology

## 2021-12-30 DIAGNOSIS — I10 Essential (primary) hypertension: Secondary | ICD-10-CM

## 2022-03-01 DIAGNOSIS — Z950 Presence of cardiac pacemaker: Secondary | ICD-10-CM | POA: Diagnosis not present

## 2022-03-01 DIAGNOSIS — I495 Sick sinus syndrome: Secondary | ICD-10-CM | POA: Diagnosis not present

## 2022-05-03 ENCOUNTER — Other Ambulatory Visit: Payer: Medicare Other

## 2022-05-10 DIAGNOSIS — D696 Thrombocytopenia, unspecified: Secondary | ICD-10-CM | POA: Diagnosis not present

## 2022-05-10 DIAGNOSIS — I480 Paroxysmal atrial fibrillation: Secondary | ICD-10-CM | POA: Diagnosis not present

## 2022-05-10 DIAGNOSIS — I129 Hypertensive chronic kidney disease with stage 1 through stage 4 chronic kidney disease, or unspecified chronic kidney disease: Secondary | ICD-10-CM | POA: Diagnosis not present

## 2022-05-10 DIAGNOSIS — Z952 Presence of prosthetic heart valve: Secondary | ICD-10-CM | POA: Diagnosis not present

## 2022-05-10 DIAGNOSIS — M25561 Pain in right knee: Secondary | ICD-10-CM | POA: Diagnosis not present

## 2022-05-10 DIAGNOSIS — D6869 Other thrombophilia: Secondary | ICD-10-CM | POA: Diagnosis not present

## 2022-05-10 DIAGNOSIS — I495 Sick sinus syndrome: Secondary | ICD-10-CM | POA: Diagnosis not present

## 2022-05-10 DIAGNOSIS — Z950 Presence of cardiac pacemaker: Secondary | ICD-10-CM | POA: Diagnosis not present

## 2022-05-10 DIAGNOSIS — N1831 Chronic kidney disease, stage 3a: Secondary | ICD-10-CM | POA: Diagnosis not present

## 2022-05-10 DIAGNOSIS — E785 Hyperlipidemia, unspecified: Secondary | ICD-10-CM | POA: Diagnosis not present

## 2022-05-10 DIAGNOSIS — E559 Vitamin D deficiency, unspecified: Secondary | ICD-10-CM | POA: Diagnosis not present

## 2022-05-11 ENCOUNTER — Encounter: Payer: Self-pay | Admitting: Cardiology

## 2022-05-11 ENCOUNTER — Ambulatory Visit: Payer: 59 | Admitting: Cardiology

## 2022-05-11 VITALS — BP 120/76 | HR 85 | Resp 17 | Ht 64.0 in | Wt 163.0 lb

## 2022-05-11 DIAGNOSIS — I495 Sick sinus syndrome: Secondary | ICD-10-CM | POA: Diagnosis not present

## 2022-05-11 DIAGNOSIS — I4819 Other persistent atrial fibrillation: Secondary | ICD-10-CM

## 2022-05-11 DIAGNOSIS — Z952 Presence of prosthetic heart valve: Secondary | ICD-10-CM | POA: Diagnosis not present

## 2022-05-11 DIAGNOSIS — T82857D Stenosis of cardiac prosthetic devices, implants and grafts, subsequent encounter: Secondary | ICD-10-CM | POA: Diagnosis not present

## 2022-05-11 NOTE — Progress Notes (Signed)
Primary Physician:  Lurline Del, DO   Patient ID: Barton Fanny, female    DOB: 23-Dec-1940, 82 y.o.   MRN: [id number]  Subjective:    Chief Complaint  Patient presents with   prosthetic valve dysfunction   sinus node dysfunction   Follow-up    1 year     HPI: KEIERRA DEHAY  is a 82 y.o. female with prior history of severe aortic regurgitation and Mitral regurgitation S/P aortic pericardial tissue valve replacement  and mitral valve ring placement on 08/12/2011 with bioprosthetic valves by Dr. Gilford Raid. Since then her ejection fraction is normalized.  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  Pacemaker history significant for hypertension, paroxysmal atrial fibrillation/atypical atrial flutter.  She presents for annual visit.  Remains asymptomatic except for arthritis pain in the right knee.  Past Medical History:  Diagnosis Date   Aortic stenosis    2013   Arthritis    "hips" (12/21/2016)   Bioprosthetic 28 mm AV and 21 mm MV 10/11/2016   Chronic diastolic CHF (congestive heart failure) (HCC)    Chronic kidney disease    Complication of anesthesia    after hip surgery 06-2016 bp and heart rate dropped pt. has had a pacemaker placed since this event.   Encounter for care of pacemaker 09/07/2018   H/O atrial fibrillation without current medication 06/06/2018   2013 post AV and MV replacement and no recurrence. On Eliquis for AV thrombotic degeneration   History of blood transfusion 05/2016   "when I had my hip replaced" (12/21/2016)   Hypertension    Paroxysmal atrial fibrillation ([place]) 06/06/2018   2013 post AV and MV replacement and no recurrence. On Eliquis for AV thrombotic degeneration   Presence of permanent cardiac pacemaker    medtronic   Shortness of breath    hx dyspnea and respiratory abnormalities   Sinus node dysfunction ([place]) 12/21/2016   Family History   Problem Relation Age of Onset   Hypertension Mother    Hypertension Sister    Hypertension Sister    Hypertension Sister     Social History   Tobacco Use   Smoking status: Never   Smokeless tobacco: Never  Substance Use Topics   Alcohol use: No   Marital Status: Single   Review of Systems  Cardiovascular:  Negative for chest pain, dyspnea on exertion and leg swelling.  Gastrointestinal:  Negative for melena.   Objective:  Blood pressure 120/76, pulse 85, resp. rate 17, height 5\' 4"  (1.626 m), weight 163 lb (73.9 kg), SpO2 97 %. Body mass index is 27.98 kg/m.      05/11/2022   11:09 AM 05/11/2021   11:38 AM 11/10/2020   11:26 AM  Vitals with BMI  Height 5\' 4"  5\' 4"  5\' 4"   Weight 163 lbs 160 lbs 3 oz 160 lbs  BMI 27.97 Q000111Q Q000111Q  Systolic 123456 0000000 Q000111Q  Diastolic 76 87 82  Pulse 85 83 70      Physical Exam Constitutional:      General: She is not in acute distress.    Appearance: She is well-developed.  Neck:     Vascular: No carotid bruit or JVD.  Cardiovascular:     Rate and Rhythm: Normal rate and regular rhythm.     Pulses: Normal pulses and intact distal pulses.          Carotid pulses are  on  the right side with bruit and  on the left side with bruit.    Heart sounds: S1 normal and S2 normal. Murmur heard.     Harsh mid to late systolic murmur is present with a grade of 2/6 at the upper right sternal border radiating to the neck.     No gallop.  Pulmonary:     Effort: Pulmonary effort is normal. No respiratory distress.     Breath sounds: Normal breath sounds. No wheezing, rhonchi or rales.  Abdominal:     General: Bowel sounds are normal.     Palpations: Abdomen is soft.  Musculoskeletal:     Right lower leg: No edema.     Left lower leg: No edema.    Radiology: No results found.  Laboratory examination:   PRN Meds:. There are no discontinued medications.  Current Outpatient Medications:    alendronate (FOSAMAX) 70 MG tablet, Take 70 mg by  mouth once a week., Disp: , Rfl:    amLODipine (NORVASC) 5 MG tablet, Take 1 tablet (5 mg total) by mouth daily., Disp: 90 tablet, Rfl: 3   Ascorbic Acid (VITAMIN C) 500 MG CAPS, Take 1 capsule by mouth daily., Disp: , Rfl:    Cholecalciferol (VITAMIN D3) 125 MCG (5000 UT) CAPS, Take 1 capsule by mouth daily., Disp: , Rfl:    ELIQUIS 5 MG TABS tablet, TAKE 1 TABLET BY MOUTH TWICE  DAILY, Disp: 200 tablet, Rfl: 2   metoprolol succinate (TOPROL-XL) 100 MG 24 hr tablet, TAKE 1 TABLET BY MOUTH DAILY  WITH OR IMMEDIATELY FOLLOWING A  MEAL, Disp: 100 tablet, Rfl: 2   Multiple Vitamin (MULTIVITAMIN WITH MINERALS) TABS tablet, Take 1 tablet by mouth daily., Disp: , Rfl:    rosuvastatin (CRESTOR) 10 MG tablet, Take 10 mg by mouth 3 (three) times a week., Disp: , Rfl:    External labs:  Labs 05/10/2022:  Vitamin D 95.5.  Serum glucose 97 mg, BUN 17, creatinine 1.30, EGFR 41 ML, potassium 4.5, LFTs normal.  A1c 6.2%.  Total cholesterol 127, triglycerides 58, HDL 61, LDL 53.  Hb 13.3/HCT 41.3, platelets 115.  Patient  Labs done 01/09/2021:  Hb 12.3/HCT 35.2, platelets 109.  Normal indicis. 1 TSH 0.800 04/05/2020  Cardiac Studies:   Valve replacement Operative Report 10/12/11: 28 mm AV and MV 21 mm Edwards pericardial valve replacement for severe AI and mod MR by Dr. Cyndia Bent, Cyndia Bent.  Coronary angiogram 09/26/2016 Normal coronary arteries. Mild to Moderate pulmonary hypertension. CO 3.99, CI 2.42   Carotid artery duplex 05/09/2018  1. Mild amount of plaque at the level of the right carotid bulb and ICA origin. Estimated right ICA stenosis is less than 50%.  2. No evidence of left ICA stenosis. 3. Tortuosity of bilateral internal carotid arteries consistent with longstanding hypertension.  PCV ECHOCARDIOGRAM COMPLETE 11/30/2020  Narrative Echocardiogram 11/30/2020: Left ventricle cavity is normal in size. Moderate concentric hypertrophy of the left ventricle. Normal global wall motion.  Normal LV systolic function with EF 59%. Diastolic function not assessed due to paced rhythm. Left atrial cavity is mildly dilated. S/p bioprosthetic 28 mm aortic valve. Vmax 3.4 m.sec. DVI 0.37, acceleration time 130 msec, mean PG 26 mmHg. No regurgitation. Prosthetic valve or subvalvular narrowing cannnot be excluded. Consider TEE, if clinically indicated. Mild tricuspid regurgitation. Estimated pulmonary artery systolic pressure 30 mmHg. Mild pulmonic regurgitation. No significant change compared to previous study in 2020.   Device Clinic: Pacemaker Dual chamber Medtronic Azure MRI  using His bundle 12/21/2016  Scheduled  In office pacemaker check 11/30/20  Single (S)/Dual (D)/BV: D. Presenting APVS. Pacemaker dependant:  No. Underlying Sinus arrest and VS. AP 97.3%, VP 48.8%.  AMS Episodes: .  AT/AF burden <0.1% . No EGM. HVR ) since July 2022  Longevity 7.7 Years. Magnet rate: >85%. Lead measurements: Stable. Histogram: Low (L)/normal (N)/high (H)  Low. Patient activity Low.   Observations: Normal pacemaker function. Changes: None  Remote dual-chamber pacemaker transmission 03/01/2022: Longevity 5 years and 9 months.  AP 0%, VP 100%.  Patient in persistent atrial fibrillation/Flutter since 01/24/2022.  AT/AF burden 100% since 01/24/2022. Lead impedance and thresholds are normal.  Normal pacemaker function.  EKG   EKG 05/11/2022: Underlying atypical atrial flutter with variable AV conduction, ventricularly paced rhythm at the rate of 81 bpm.  Left bundle/His bundle pacing.  No further analysis.  Compared to 05/11/2021, AV paced rhythm has been replaced by atrial fibrillation.   Assessment:     ICD-10-CM   1. Sinus node dysfunction (HCC)  I49.5 EKG 12-Lead    2. Persistent atrial fibrillation (HCC)  I48.19     3. Bioprosthetic 28 mm AV and 21 mm MV 10/12/11  Z95.2     4. Stenosis of prosthetic aortic valve, subsequent encounter  T82.857D       Recommendations:   ESHAAL SKILLINGS  is a 82 y.o. female  with prior history of severe aortic regurgitation and Mitral regurgitation S/P aortic pericardial tissue valve replacement  and mitral valve ring placement on 08/12/2011 with bioprosthetic valves by Dr. Gilford Raid. Since then her ejection fraction is normalized.  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  Pacemaker history significant for hypertension, paroxysmal atrial fibrillation/atypical atrial flutter.  1. Sinus node dysfunction (HCC) Patient now is in longstanding atrial fibrillation/atypical atrial flutter, she remains asymptomatic.  I had noticed this several months ago and wanted to see her back in the office but as she was asymptomatic, she did not want to come in.  Today she is in persistent atypical atrial flutter.  As she remains asymptomatic, continue rate control, patient has underlying complete heart block hence pacer dependent.  No changes in the medications were done today.  Continue anticoagulation that we are using for bioprosthetic valve fibrotic stenosis.  2. Persistent atrial fibrillation ([place]) As dictated above, patient has persistent atrial fibrillation/atypical atrial flutter.  Continue anticoagulation.  I reviewed her external labs, CBC has remained stable.  She does have chronic thrombocytopenia that has remained stable.  No bleeding diathesis.  3. Bioprosthetic 28 mm AV and 21 mm MV 10/12/11 Patient is status post aortic valve replacement with bioprosthetic valve and mitral valve repair.  She needs endocarditis prophylaxis.  4. Stenosis of prosthetic aortic valve, subsequent encounter She needs annual surveillance of aortic valve stenosis.  Since we started her on anticoagulation, her gradients remain very stable and steady and she remains asymptomatic.  Echocardiogram repeat is pending.  Unless I see abnormality, I will continue to see her  back on annual basis.  She does need her pacemaker checked at some point as it has been >1-year since office check, remote check reveals normally functioning pacemaker.  External labs reviewed.   Adrian Prows, MD, [facility] 05/11/2022, 12:16 PM Office: [phone] Fax: [phone] Pager: [phone]

## 2022-05-12 ENCOUNTER — Ambulatory Visit: Payer: 59 | Admitting: Cardiology

## 2022-05-16 ENCOUNTER — Encounter: Payer: 59 | Admitting: Cardiology

## 2022-05-16 ENCOUNTER — Other Ambulatory Visit: Payer: Medicare Other

## 2022-05-31 DIAGNOSIS — Z45018 Encounter for adjustment and management of other part of cardiac pacemaker: Secondary | ICD-10-CM | POA: Diagnosis not present

## 2022-05-31 DIAGNOSIS — I495 Sick sinus syndrome: Secondary | ICD-10-CM | POA: Diagnosis not present

## 2022-06-05 ENCOUNTER — Other Ambulatory Visit: Payer: Medicare Other

## 2022-06-13 ENCOUNTER — Ambulatory Visit: Payer: 59

## 2022-06-13 ENCOUNTER — Ambulatory Visit: Payer: 59 | Admitting: Cardiology

## 2022-06-13 ENCOUNTER — Encounter: Payer: Self-pay | Admitting: Cardiology

## 2022-06-13 DIAGNOSIS — I4819 Other persistent atrial fibrillation: Secondary | ICD-10-CM | POA: Diagnosis not present

## 2022-06-13 DIAGNOSIS — I442 Atrioventricular block, complete: Secondary | ICD-10-CM

## 2022-06-13 DIAGNOSIS — Z952 Presence of prosthetic heart valve: Secondary | ICD-10-CM

## 2022-06-13 DIAGNOSIS — Z45018 Encounter for adjustment and management of other part of cardiac pacemaker: Secondary | ICD-10-CM | POA: Diagnosis not present

## 2022-06-13 DIAGNOSIS — I10 Essential (primary) hypertension: Secondary | ICD-10-CM | POA: Diagnosis not present

## 2022-06-13 DIAGNOSIS — Z950 Presence of cardiac pacemaker: Secondary | ICD-10-CM

## 2022-06-13 NOTE — Progress Notes (Signed)
Chief Complaint  Patient presents with   Pacemaker Check   Scheduled  In office pacemaker check 06/13/2022  Single (S)/Dual (D)/BV: D. Presenting ASVS. Pacemaker dependant:  Yes CHB with no escape AP 0%, VP 92.4%.  AMS Episodes: .  AT/AF 100% burden since 01/09/2022  HVR 5 Brief NSVT last episode June 2023 Longevity 5.1 Years. Magnet rate: >85%. Lead measurements: Stable. Histogram: Low (L)/normal (N)/high (H)  Low. Patient activity Low.  AT pacing burst sequence unable to convert organized AT/AF.     ICD-10-CM   1. Encounter for care of pacemaker  Z45.018     2. Pacemaker Dual chamber Medtronic Azure MRI  using His bundle pacing  Z95.0     3. Complete heart block  I44.2     4. Persistent atrial fibrillation  I48.19 CBC    Basic metabolic panel    Brain natriuretic peptide     Orders Placed This Encounter  Procedures   CBC   Basic metabolic panel   Brain natriuretic peptide    I discussed with the patient that she is in persistent organized atypical atrial flutter, although she is asymptomatic, it may be worthwhile to maintain sinus rhythm, and she is willing to proceed with cardioversion.  Schedule for Direct current cardioversion. I have discussed regarding risks benefits rate control vs rhythm control with the patient. Patient understands cardiac arrest and need for CPR, aspiration pneumonia, but not limited to these. Patient is willing.    Yates Decamp, MD, [facility] 06/13/2022, 1:29 PM Office: [phone] Fax: [phone] Pager: [phone]

## 2022-06-21 DIAGNOSIS — I480 Paroxysmal atrial fibrillation: Secondary | ICD-10-CM | POA: Diagnosis not present

## 2022-06-21 DIAGNOSIS — I4819 Other persistent atrial fibrillation: Secondary | ICD-10-CM | POA: Diagnosis not present

## 2022-06-22 LAB — BASIC METABOLIC PANEL
BUN/Creatinine Ratio: 19 (ref 12–28)
BUN: 25 mg/dL (ref 8–27)
CO2: 20 mmol/L (ref 20–29)
Calcium: 10.5 mg/dL — ABNORMAL HIGH (ref 8.7–10.3)
Chloride: 104 mmol/L (ref 96–106)
Creatinine, Ser: 1.34 mg/dL — ABNORMAL HIGH (ref 0.57–1.00)
Glucose: 111 mg/dL — ABNORMAL HIGH (ref 70–99)
Potassium: 4.5 mmol/L (ref 3.5–5.2)
Sodium: 140 mmol/L (ref 134–144)
eGFR: 40 mL/min/{1.73_m2} — ABNORMAL LOW (ref >59)

## 2022-06-22 LAB — CBC
Hematocrit: 39 % (ref 34.0–46.6)
Hemoglobin: 12.9 g/dL (ref 11.1–15.9)
MCH: 31.5 pg (ref 26.6–33.0)
MCHC: 33.1 g/dL (ref 31.5–35.7)
MCV: 95 fL (ref 79–97)
Platelets: 128 10*3/uL — ABNORMAL LOW (ref 150–450)
RBC: 4.1 x10E6/uL (ref 3.77–5.28)
RDW: 13.3 % (ref 11.7–15.4)
WBC: 4 10*3/uL (ref 3.4–10.8)

## 2022-06-22 LAB — BRAIN NATRIURETIC PEPTIDE: BNP: 286 pg/mL — ABNORMAL HIGH (ref 0.0–100.0)

## 2022-06-23 ENCOUNTER — Ambulatory Visit (HOSPITAL_COMMUNITY): Admit: 2022-06-23 | Payer: Medicare Other | Admitting: Cardiology

## 2022-06-23 ENCOUNTER — Encounter (HOSPITAL_COMMUNITY): Payer: Self-pay

## 2022-06-23 SURGERY — CARDIOVERSION
Anesthesia: General

## 2022-07-06 NOTE — Anesthesia Preprocedure Evaluation (Addendum)
Anesthesia Evaluation  Patient identified by MRN, date of birth, ID band Patient awake    Reviewed: Allergy & Precautions, NPO status , Patient's Chart, lab work & pertinent test results  History of Anesthesia Complications (+) history of anesthetic complications (HR and BP dropped after hip surgery)  Airway Mallampati: III  TM Distance: >3 FB Neck ROM: Full   Comment: Previous grade II view with Miller 2, easy mask Dental  (+) Partial Upper   Pulmonary neg pulmonary ROS, neg shortness of breath, neg sleep apnea, neg COPD, neg recent URI   Pulmonary exam normal breath sounds clear to auscultation       Cardiovascular hypertension (amlodipine, metoprolol), Pt. on medications and Pt. on home beta blockers (-) angina +CHF (diastolic)  (-) Past MI, (-) Cardiac Stents and (-) CABG + dysrhythmias Atrial Fibrillation + pacemaker (Medtronic, dependent due to CHB) + Valvular Problems/Murmurs (bioprosthetic AV and MV 10/11/2016)  Rhythm:Regular Rate:Normal  HLD  Echocardiogram 06/13/2022: Normal LV systolic function with visual EF 60-65%. Left ventricle cavity is normal in size. Moderate concentric hypertrophy of the left ventricle. Normal global wall motion. Indeterminate diastolic filling pattern, indeterminate LAP. Calculated EF 60%. Left atrial cavity is mildly dilated at 4.2 cm. Bioprosthetic trileaflet aortic valve.  Trace aortic regurgitation. AVA (VTI) measures 1.4 cm^2. AV Mean Grad measures 20.3 mmHg. AV Pk Vel measures 3.36 m/s. Cannot exclude prosthetic valve stenosis or PPM, recommend TEE if clinically indicated. Well seated 21 mm mitral valve ring. No significant stenosis or regurgitation. Structurally normal tricuspid valve.  Mild tricuspid regurgitation. No evidence of pulmonary hypertension. RVSP measures 30 mmHg. Structurally normal pulmonic valve.  Mild pulmonic regurgitation.     Neuro/Psych neg Seizures negative  neurological ROS     GI/Hepatic negative GI ROS, Neg liver ROS,,,  Endo/Other  negative endocrine ROS    Renal/GU CRFRenal disease     Musculoskeletal  (+) Arthritis ,  osteoporosis   Abdominal   Peds  Hematology negative hematology ROS (+)   Anesthesia Other Findings Last Eliquis:  Reproductive/Obstetrics                             Anesthesia Physical Anesthesia Plan  ASA: 4  Anesthesia Plan: General   Post-op Pain Management: Minimal or no pain anticipated   Induction: Intravenous  PONV Risk Score and Plan: 3 and Treatment may vary due to age or medical condition  Airway Management Planned: Mask and Natural Airway  Additional Equipment:   Intra-op Plan:   Post-operative Plan:   Informed Consent: I have reviewed the patients History and Physical, chart, labs and discussed the procedure including the risks, benefits and alternatives for the proposed anesthesia with the patient or authorized representative who has indicated his/her understanding and acceptance.     Dental advisory given  Plan Discussed with: CRNA and Anesthesiologist  Anesthesia Plan Comments: (Risks of general anesthesia discussed including, but not limited to, sore throat, hoarse voice, chipped/damaged teeth, injury to vocal cords, nausea and vomiting, allergic reactions, lung infection, heart attack, stroke, and death. All questions answered. )       Anesthesia Quick Evaluation

## 2022-07-06 NOTE — Pre-Procedure Instructions (Signed)
Spoke to patient on phone regarding cardioversion tomorrow - arrive at 0800, NPO after midnight, take pills in the AM with a sip of water, confirmed patient has ride home and responsible person to stay with her for 24 hours after procedure, confirmed no missed doses of Eliquis

## 2022-07-07 ENCOUNTER — Ambulatory Visit (HOSPITAL_BASED_OUTPATIENT_CLINIC_OR_DEPARTMENT_OTHER): Payer: 59 | Admitting: Anesthesiology

## 2022-07-07 ENCOUNTER — Other Ambulatory Visit: Payer: Self-pay

## 2022-07-07 ENCOUNTER — Ambulatory Visit (HOSPITAL_COMMUNITY)
Admission: RE | Admit: 2022-07-07 | Discharge: 2022-07-07 | Disposition: A | Discharge status: Home / Self Care | Payer: 59 | Attending: Cardiology | Admitting: Cardiology

## 2022-07-07 ENCOUNTER — Encounter (HOSPITAL_COMMUNITY): Payer: Self-pay | Admitting: Cardiology

## 2022-07-07 ENCOUNTER — Encounter (HOSPITAL_COMMUNITY)
Admission: RE | Disposition: A | Discharge status: Home / Self Care | Payer: Self-pay | Source: Home / Self Care | Attending: Cardiology

## 2022-07-07 ENCOUNTER — Ambulatory Visit (HOSPITAL_COMMUNITY): Payer: 59 | Admitting: Anesthesiology

## 2022-07-07 DIAGNOSIS — I4819 Other persistent atrial fibrillation: Secondary | ICD-10-CM

## 2022-07-07 DIAGNOSIS — N189 Chronic kidney disease, unspecified: Secondary | ICD-10-CM

## 2022-07-07 DIAGNOSIS — I442 Atrioventricular block, complete: Secondary | ICD-10-CM | POA: Insufficient documentation

## 2022-07-07 DIAGNOSIS — I480 Paroxysmal atrial fibrillation: Secondary | ICD-10-CM

## 2022-07-07 DIAGNOSIS — I4891 Unspecified atrial fibrillation: Secondary | ICD-10-CM

## 2022-07-07 DIAGNOSIS — I5032 Chronic diastolic (congestive) heart failure: Secondary | ICD-10-CM | POA: Diagnosis not present

## 2022-07-07 DIAGNOSIS — I130 Hypertensive heart and chronic kidney disease with heart failure and stage 1 through stage 4 chronic kidney disease, or unspecified chronic kidney disease: Secondary | ICD-10-CM

## 2022-07-07 DIAGNOSIS — I5030 Unspecified diastolic (congestive) heart failure: Secondary | ICD-10-CM

## 2022-07-07 DIAGNOSIS — Z45010 Encounter for checking and testing of cardiac pacemaker pulse generator [battery]: Secondary | ICD-10-CM | POA: Diagnosis not present

## 2022-07-07 DIAGNOSIS — N186 End stage renal disease: Secondary | ICD-10-CM | POA: Diagnosis not present

## 2022-07-07 DIAGNOSIS — Z950 Presence of cardiac pacemaker: Secondary | ICD-10-CM | POA: Diagnosis not present

## 2022-07-07 DIAGNOSIS — I495 Sick sinus syndrome: Secondary | ICD-10-CM | POA: Insufficient documentation

## 2022-07-07 DIAGNOSIS — Z7901 Long term (current) use of anticoagulants: Secondary | ICD-10-CM | POA: Insufficient documentation

## 2022-07-07 HISTORY — PX: CARDIOVERSION: SHX1299

## 2022-07-07 SURGERY — CARDIOVERSION
Anesthesia: General

## 2022-07-07 MED ORDER — LIDOCAINE 2% (20 MG/ML) 5 ML SYRINGE
INTRAMUSCULAR | Status: DC | PRN
  Administered 2022-07-07: 40 mg via INTRAVENOUS

## 2022-07-07 MED ORDER — SODIUM CHLORIDE 0.9 % IV SOLN: INTRAVENOUS | Status: DC

## 2022-07-07 MED ORDER — PROPOFOL 10 MG/ML IV BOLUS
INTRAVENOUS | Status: DC | PRN
  Administered 2022-07-07: 40 mg via INTRAVENOUS

## 2022-07-07 MED ORDER — SODIUM CHLORIDE 0.9 % IV SOLN: INTRAVENOUS | Status: DC | PRN

## 2022-07-07 SURGICAL SUPPLY — 1 items: ELECT DEFIB PAD ADLT CADENCE (PAD) ×1 IMPLANT

## 2022-07-07 NOTE — H&P (Signed)
Primary Physician:  Jackelyn Poling, DO   Patient ID: [name], female    DOB: 1940/09/25, 82 y.o.   MRN: [id number]  Subjective:    A. Fib for cardiovbersion    HPI: [name]  is a 82 y.o. female with prior history of severe aortic regurgitation and Mitral regurgitation S/P aortic pericardial tissue valve replacement  and mitral valve ring placement on 08/12/2011 with bioprosthetic valves by Dr. Evelene Croon. Since then her ejection fraction is normalized.  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  Pacemaker history significant for hypertension, paroxysmal atrial fibrillation/atypical atrial flutter.  She has been in persistent AF since Dec 2023, and has been on anticoagulation. Due to CHF and to improve diastology, proceeding with DCCV.   Past Medical History:  Diagnosis Date   Aortic stenosis    2013   Arthritis    "hips" (12/21/2016)   Bioprosthetic 28 mm AV and 21 mm MV 10/11/2016   Chronic diastolic CHF (congestive heart failure) (HCC)    Chronic kidney disease    Complication of anesthesia    after hip surgery 06-2016 bp and heart rate dropped pt. has had a pacemaker placed since this event.   Encounter for care of pacemaker 09/07/2018   H/O atrial fibrillation without current medication 06/06/2018   2013 post AV and MV replacement and no recurrence. On Eliquis for AV thrombotic degeneration   History of blood transfusion 05/2016   "when I had my hip replaced" (12/21/2016)   Hypertension    Paroxysmal atrial fibrillation (HCC) 06/06/2018   2013 post AV and MV replacement and no recurrence. On Eliquis for AV thrombotic degeneration   Presence of permanent cardiac pacemaker    medtronic   Shortness of breath    hx dyspnea and respiratory abnormalities   Sinus node dysfunction (HCC) 12/21/2016   Family History  Problem Relation Age of Onset   Hypertension Mother     Hypertension Sister    Hypertension Sister    Hypertension Sister     Social History   Tobacco Use   Smoking status: Never   Smokeless tobacco: Never  Substance Use Topics   Alcohol use: No   Marital Status: Single   Review of Systems  Cardiovascular:  Negative for chest pain, dyspnea on exertion and leg swelling.  Gastrointestinal:  Negative for melena.   Objective:  Blood pressure (!) 144/96, pulse 61, temperature 97.9 F (36.6 C), temperature source Temporal, resp. rate 18, height 5\' 4"  (1.626 m), weight 72.6 kg, SpO2 96 %. Body mass index is 27.46 kg/m.      07/07/2022    8:56 AM 07/07/2022    8:50 AM 05/11/2022   11:09 AM  Vitals with BMI  Height  5\' 4"  5\' 4"   Weight  160 lbs 163 lbs  BMI  27.45 27.97  Systolic 144 144 409  Diastolic 96 96 76  Pulse 61 77 85      Physical Exam Constitutional:      General: She is not in acute distress.    Appearance: She is well-developed.  Neck:     Vascular: No carotid bruit or JVD.  Cardiovascular:     Rate and Rhythm: Normal rate and regular rhythm.     Pulses: Normal pulses and intact distal pulses.          Carotid pulses are  on the right side with bruit and  on the left side with bruit.    Heart sounds: S1 normal and S2 normal. Murmur heard.     Harsh mid to late systolic murmur is present with a grade of 2/6 at the upper right sternal border radiating to the neck.     No gallop.  Pulmonary:     Effort: Pulmonary effort is normal. No respiratory distress.     Breath sounds: Normal breath sounds. No wheezing, rhonchi or rales.  Abdominal:     General: Bowel sounds are normal.     Palpations: Abdomen is soft.  Musculoskeletal:     Right lower leg: No edema.     Left lower leg: No edema.    Radiology: No results found.  Laboratory examination:   PRN Meds:. There are no discontinued medications.  Current Facility-Administered Medications:    0.9 %  sodium chloride infusion, , Intravenous, Continuous,  Yates Decamp, MD, Last Rate: 20 mL/hr at 07/07/22 0927, New Bag at 07/07/22 1610   External labs:  Labs 05/10/2022:  Vitamin D 95.5.  Serum glucose 97 mg, BUN 17, creatinine 1.30, EGFR 41 ML, potassium 4.5, LFTs normal.  A1c 6.2%.  Total cholesterol 127, triglycerides 58, HDL 61, LDL 53.  Hb 13.3/HCT 41.3, platelets 115.  Patient  Labs done 01/09/2021:  Hb 12.3/HCT 35.2, platelets 109.  Normal indicis. 1 TSH 0.800 04/05/2020  Cardiac Studies:   Valve replacement Operative Report 10/12/11: 28 mm AV and MV 21 mm Edwards pericardial valve replacement for severe AI and mod MR by Dr. Laneta Simmers, Laneta Simmers.  Coronary angiogram 09/26/2016 Normal coronary arteries. Mild to Moderate pulmonary hypertension. CO 3.99, CI 2.42   Carotid artery duplex 05/09/2018  1. Mild amount of plaque at the level of the right carotid bulb and ICA origin. Estimated right ICA stenosis is less than 50%.  2. No evidence of left ICA stenosis. 3. Tortuosity of bilateral internal carotid arteries consistent with longstanding hypertension.  PCV ECHOCARDIOGRAM COMPLETE 11/30/2020  Narrative Echocardiogram 11/30/2020: Left ventricle cavity is normal in size. Moderate concentric hypertrophy of the left ventricle. Normal global wall motion. Normal LV systolic function with EF 59%. Diastolic function not assessed due to paced rhythm. Left atrial cavity is mildly dilated. S/p bioprosthetic 28 mm aortic valve. Vmax 3.4 m.sec. DVI 0.37, acceleration time 130 msec, mean PG 26 mmHg. No regurgitation. Prosthetic valve or subvalvular narrowing cannnot be excluded. Consider TEE, if clinically indicated. Mild tricuspid regurgitation. Estimated pulmonary artery systolic pressure 30 mmHg. Mild pulmonic regurgitation. No significant change compared to previous study in 2020.   Device Clinic: Pacemaker Dual chamber Medtronic Azure MRI  using His bundle 12/21/2016   Scheduled  In office pacemaker check 06/13/2022  Single (S)/Dual  (D)/BV: D. Presenting ASVS. Pacemaker dependant:  Yes CHB with no escape AP 0%, VP 92.4%.  AMS Episodes: .  AT/AF 100% burden since 01/09/2022  HVR 5 Brief NSVT last episode June 2023 Longevity 5.1 Years. Magnet rate: >85%. Lead measurements: Stable. Histogram: Low (L)/normal (N)/high (H)  Low. Patient activity Low.  AT pacing burst sequence unable to convert organized AT/AF.  EKG   EKG 05/11/2022: Underlying atypical atrial flutter with variable AV conduction, ventricularly paced rhythm at the rate of 81 bpm.  Left bundle/His bundle pacing.  No further analysis.  Compared to 05/11/2021, AV paced rhythm has been replaced by atrial fibrillation.   Assessment:   Sinus node dysfunction Persistent AF   Recommendations:   KENSLIE LOMBARDI  is a 82 y.o. female  with prior history  of severe aortic regurgitation and Mitral regurgitation S/P aortic pericardial tissue valve replacement  and mitral valve ring placement on 08/12/2011 with bioprosthetic valves by Dr. Evelene Croon. Since then her ejection fraction is normalized.  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  Pacemaker history significant for hypertension, paroxysmal atrial fibrillation/atypical atrial flutter.    Scheduled for DCCV. All questions answered.   Yates Decamp, MD, [facility] 07/07/2022, 10:02 AM Office: [phone] Fax: [phone] Pager: [phone]

## 2022-07-07 NOTE — CV Procedure (Signed)
Direct current cardioversion 07/07/2022 10:22 AM  Indication symptomatic A. Fibrillation.  Procedure: Using 40 mg of IV Propofol and 40 IV Lidocaine (for reducing venous pain) for achieving deep sedation, synchronized direct current cardioversion performed. Patient was delivered with 120 Joules of electricity X 1 with success to NSR. Patient tolerated the procedure well. No immediate complication noted.   Scheduled  In office pacemaker check @TODAY @  Single (S)/Dual (D)/BV: D. Presenting ASVP. Pacemaker dependant:  Yes. Underlying No escape. AP 0%, VP 100%. . AMS Episodes .  AT/AF burden 100% .HVR 0. Longevity 5 Years. Magnet rate: >85%. Lead measurements: Stable.  Histogram: Low (L)/normal (N)/high (H)  Normal. Patient activity Low.   Observations: Successful direct current cardioversion to APVP rhythm. Changes: None.   Allergies as of 07/07/2022       Reactions   Aldactone [spironolactone] Other (See Comments)   Acute renal failure        Medication List     TAKE these medications    alendronate 70 MG tablet Commonly known as: FOSAMAX Take 70 mg by mouth every Monday.   amLODipine 5 MG tablet Commonly known as: NORVASC Take 1 tablet (5 mg total) by mouth daily.   Eliquis 5 MG Tabs tablet Generic drug: apixaban TAKE 1 TABLET BY MOUTH TWICE  DAILY   metoprolol succinate 100 MG 24 hr tablet Commonly known as: TOPROL-XL TAKE 1 TABLET BY MOUTH DAILY  WITH OR IMMEDIATELY FOLLOWING A  MEAL   multivitamin with minerals Tabs tablet Take 1 tablet by mouth daily.   rosuvastatin 10 MG tablet Commonly known as: CRESTOR Take 10 mg by mouth every Monday, Wednesday, and Friday.   Vitamin C 500 MG Caps Take 500 mg by mouth daily.   Vitamin D3 125 MCG (5000 UT) Caps Take 5,000 Units by mouth daily.          Yates Decamp, MD, [facility] 07/07/2022, 10:22 AM Office: [phone] Fax: [phone] Pager: [phone]

## 2022-07-07 NOTE — Transfer of Care (Signed)
Immediate Anesthesia Transfer of Care Note  Patient: [name]  Procedure(s) Performed: CARDIOVERSION  Patient Location: PACU  Anesthesia Type:General  Level of Consciousness: drowsy and patient cooperative  Airway & Oxygen Therapy: Patient Spontanous Breathing and Patient connected to nasal cannula oxygen  Post-op Assessment: Report given to RN and Post -op Vital signs reviewed and stable  Post vital signs: Reviewed and stable  Last Vitals:  Vitals Value Taken Time  BP    Temp    Pulse    Resp    SpO2      Last Pain:  Vitals:   07/07/22 0856  TempSrc:   PainSc: 0-No pain         Complications: No notable events documented.

## 2022-07-07 NOTE — Anesthesia Procedure Notes (Signed)
Procedure Name: MAC Date/Time: 07/07/2022 10:07 AM  Performed by: Adria Dill, CRNAPre-anesthesia Checklist: Patient identified, Emergency Drugs available, Suction available and Patient being monitored Patient Re-evaluated:Patient Re-evaluated prior to induction Oxygen Delivery Method: Nasal cannula Preoxygenation: Pre-oxygenation with 100% oxygen Induction Type: IV induction Placement Confirmation: positive ETCO2 and breath sounds checked- equal and bilateral Dental Injury: Teeth and Oropharynx as per pre-operative assessment

## 2022-07-07 NOTE — Discharge Instructions (Signed)

## 2022-07-07 NOTE — Interval H&P Note (Signed)
History and Physical Interval Note:  07/07/2022 10:04 AM  [name]  has presented today for surgery, with the diagnosis of AFIB.  The various methods of treatment have been discussed with the patient and family. After consideration of risks, benefits and other options for treatment, the patient has consented to  Procedure(s): CARDIOVERSION (N/A) as a surgical intervention.  The patient's history has been reviewed, patient examined, no change in status, stable for surgery.  I have reviewed the patient's chart and labs.  Questions were answered to the patient's satisfaction.     Yates Decamp

## 2022-07-07 NOTE — Progress Notes (Signed)
Pacemaker interrogated by Dr. Jacinto Halim, underlying rhythm a.fib

## 2022-07-07 NOTE — Anesthesia Postprocedure Evaluation (Signed)
Anesthesia Post Note  Patient: [name]  Procedure(s) Performed: CARDIOVERSION     Patient location during evaluation: PACU Anesthesia Type: General Level of consciousness: awake Pain management: pain level controlled Vital Signs Assessment: post-procedure vital signs reviewed and stable Respiratory status: spontaneous breathing, nonlabored ventilation and respiratory function stable Cardiovascular status: blood pressure returned to baseline and stable Postop Assessment: no apparent nausea or vomiting Anesthetic complications: no   No notable events documented.  Last Vitals:  Vitals:   07/07/22 1022 07/07/22 1025  BP: 91/75 102/82  Pulse: 67 64  Resp: 20 13  Temp: 36.8 C   SpO2: 100% 100%    Last Pain:  Vitals:   07/07/22 1022  TempSrc: Temporal  PainSc: 0-No pain                 Linton Rump

## 2022-07-10 ENCOUNTER — Encounter (HOSPITAL_COMMUNITY): Payer: Self-pay | Admitting: Cardiology

## 2022-08-03 ENCOUNTER — Other Ambulatory Visit: Payer: Self-pay | Admitting: Cardiology

## 2022-08-30 DIAGNOSIS — I495 Sick sinus syndrome: Secondary | ICD-10-CM | POA: Diagnosis not present

## 2022-08-30 DIAGNOSIS — Z45018 Encounter for adjustment and management of other part of cardiac pacemaker: Secondary | ICD-10-CM | POA: Diagnosis not present

## 2022-09-12 ENCOUNTER — Ambulatory Visit: Payer: 59 | Admitting: Cardiology

## 2022-09-12 ENCOUNTER — Encounter: Payer: Self-pay | Admitting: Cardiology

## 2022-09-12 VITALS — BP 128/84 | HR 64 | Resp 16 | Ht 64.0 in | Wt 161.0 lb

## 2022-09-12 DIAGNOSIS — I442 Atrioventricular block, complete: Secondary | ICD-10-CM | POA: Diagnosis not present

## 2022-09-12 DIAGNOSIS — Z952 Presence of prosthetic heart valve: Secondary | ICD-10-CM | POA: Diagnosis not present

## 2022-09-12 DIAGNOSIS — Z950 Presence of cardiac pacemaker: Secondary | ICD-10-CM | POA: Diagnosis not present

## 2022-09-12 DIAGNOSIS — I480 Paroxysmal atrial fibrillation: Secondary | ICD-10-CM

## 2022-09-12 MED ORDER — AMOXICILLIN 500 MG PO CAPS
2000.0000 mg | ORAL_CAPSULE | Freq: Once | ORAL | 3 refills | Status: DC | PRN
Start: 2022-09-12 — End: 2023-03-20

## 2022-09-12 NOTE — Progress Notes (Signed)
Primary Physician:  Jackelyn Poling, DO   Patient ID: [name], female    DOB: 1940-08-18, 82 y.o.   MRN: [id number]  Subjective:    Chief Complaint  Patient presents with   Atrial Fibrillation   Follow-up    3 month     HPI: [name]  is a 82 y.o. female female  with prior history of severe aortic regurgitation and Mitral regurgitation S/P aortic pericardial tissue valve replacement and mitral valve ring placement on 08/12/2011 with bioprosthetic valves by Dr. Evelene Croon. Since then her ejection fraction is normalized.  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 Although asymptomatic, in view of new onset atrial fibrillation, she underwent direct-current cardioversion on 07/07/2022 and she presents here for 52-month office visit.  She remains asymptomatic.  She presents for 3 month visit.  Remains asymptomatic.  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 has had a pacemaker placed since this event.   Encounter for care of pacemaker 09/07/2018   History of blood transfusion 05/2016   "when I had my hip replaced" (12/21/2016)   Hypertension    Paroxysmal atrial fibrillation (HCC) 06/06/2018   2013 post AV and MV replacement and no recurrence. On Eliquis for AV thrombotic degeneration   Presence of permanent cardiac pacemaker    medtronic   Sinus node dysfunction (HCC) 12/21/2016   Family History  Problem Relation Age of Onset   Hypertension Mother    Hypertension Sister    Hypertension Sister    Hypertension Sister     Social History   Tobacco Use   Smoking status: Never    Smokeless tobacco: Never  Substance Use Topics   Alcohol use: No   Marital Status: Single   Review of Systems  Cardiovascular:  Negative for chest pain, dyspnea on exertion and leg swelling.  Gastrointestinal:  Negative for melena.   Objective:  Blood pressure 128/84, pulse 64, resp. rate 16, height 5\' 4"  (1.626 m), weight 161 lb (73 kg), SpO2 97%. Body mass index is 27.64 kg/m.      09/12/2022    1:42 PM 09/12/2022    1:38 PM 07/07/2022   10:30 AM  Vitals with BMI  Height  5\' 4"    Weight  161 lbs   BMI  27.62   Systolic 128 156 027  Diastolic 84 99 77  Pulse 64 75 62      Physical Exam Constitutional:      General: She is not in acute distress.    Appearance: She is well-developed.  Neck:     Vascular: No carotid bruit or JVD.  Cardiovascular:     Rate and Rhythm: Normal rate and regular rhythm.     Pulses: Normal pulses and intact distal pulses.          Carotid pulses are  on the right side with bruit and  on the left side with bruit.    Heart sounds: S1 normal and S2 normal. Murmur heard.     Harsh mid to late systolic murmur is present with a grade of 2/6 at  the upper right sternal border radiating to the neck.     No gallop.  Pulmonary:     Effort: Pulmonary effort is normal. No respiratory distress.     Breath sounds: Normal breath sounds. No wheezing, rhonchi or rales.  Abdominal:     General: Bowel sounds are normal.     Palpations: Abdomen is soft.  Musculoskeletal:     Right lower leg: Edema (1-2+ pitting below knee) present.     Left lower leg: Edema (1-2+ pitting below knee) present.    Radiology: No results found.  Laboratory examination:   PRN Meds:. There are no discontinued medications.  Current Outpatient Medications:    alendronate (FOSAMAX) 70 MG tablet, Take 70 mg by mouth every Monday., Disp: , Rfl:    amLODipine (NORVASC) 5 MG tablet, Take 1 tablet (5 mg total) by mouth daily., Disp: 90 tablet, Rfl: 3   amoxicillin (AMOXIL) 500 MG  capsule, Take 4 capsules (2,000 mg total) by mouth once as needed for up to 1 dose (Dental prophylaxis)., Disp: 4 capsule, Rfl: 3   Ascorbic Acid (VITAMIN C) 500 MG CAPS, Take 500 mg by mouth daily., Disp: , Rfl:    Cholecalciferol (VITAMIN D3) 125 MCG (5000 UT) CAPS, Take 5,000 Units by mouth daily., Disp: , Rfl:    ELIQUIS 5 MG TABS tablet, TAKE 1 TABLET BY MOUTH TWICE  DAILY, Disp: 200 tablet, Rfl: 2   metoprolol succinate (TOPROL-XL) 100 MG 24 hr tablet, TAKE 1 TABLET BY MOUTH DAILY  WITH OR IMMEDIATELY FOLLOWING A  MEAL, Disp: 100 tablet, Rfl: 2   Multiple Vitamin (MULTIVITAMIN WITH MINERALS) TABS tablet, Take 1 tablet by mouth daily., Disp: , Rfl:    rosuvastatin (CRESTOR) 10 MG tablet, Take 10 mg by mouth every Monday, Wednesday, and Friday., Disp: , Rfl:    External labs:  Labs 05/10/2022:  Vitamin D 95.5.  Serum glucose 97 mg, BUN 17, creatinine 1.30, EGFR 41 ML, potassium 4.5, LFTs normal.  A1c 6.2%.  Total cholesterol 127, triglycerides 58, HDL 61, LDL 53.  Hb 13.3/HCT 41.3, platelets 115.  Patient  Labs done 01/09/2021:  Hb 12.3/HCT 35.2, platelets 109.  Normal indicis. 1 TSH 0.800 04/05/2020  Cardiac Studies:   Valve replacement Operative Report 10/12/11: 28 mm AV and MV 21 mm Edwards pericardial valve replacement for severe AI and mod MR by Dr. Laneta Simmers, Laneta Simmers.  Coronary angiogram 09/26/2016 Normal coronary arteries. Mild to Moderate pulmonary hypertension. CO 3.99, CI 2.42   Carotid artery duplex 05/09/2018  1. Mild amount of plaque at the level of the right carotid bulb and ICA origin. Estimated right ICA stenosis is less than 50%.  2. No evidence of left ICA stenosis. 3. Tortuosity of bilateral internal carotid arteries consistent with longstanding hypertension.  Echocardiogram 06/13/2022:  Normal LV systolic function with visual EF 60-65%. Left ventricle cavity is normal in size. Moderate concentric hypertrophy of the left ventricle. Normal global wall motion.  Indeterminate diastolic filling pattern, indeterminate LAP. Calculated EF 60%. Left atrial cavity is mildly dilated at 4.2 cm. Bioprosthetic trileaflet aortic valve.  Trace aortic regurgitation. AVA (VTI) measures 1.4 cm^2. AV Mean Grad measures 20.3 mmHg. AV Pk Vel measures 3.36 m/s. Cannot exclude prosthetic valve stenosis or PPM, recommend TEE if clinically indicated. Well seated 21 mm mitral valve ring. No significant stenosis or regurgitation. Structurally normal tricuspid valve.  Mild tricuspid regurgitation. No evidence of pulmonary hypertension. RVSP measures 30 mmHg. Structurally normal pulmonic valve.  Mild pulmonic regurgitation. No significant change  compared to 11/2020.  Device Clinic: Pacemaker Dual chamber Medtronic Azure MRI  using His bundle 12/21/2016   Remote dual-chamber pacemaker transmission 08/30/2022: Longevity 4 years and 5 months.  AP 96 %, VP 98 %.  Patient back in sinus rhythm since 07/07/2022 (cardioversion) with no further episodes of atrial fibrillation.  Lead impedance and thresholds are normal.  Normal pacemaker function.  EKG   EKG 09/12/2022: AV paced rhythm.  Compared to 05/11/2022, underlying atrial fibrillation with ventricular paced rhythm has been replaced.  EKG 05/11/2022: Underlying atypical atrial flutter with variable AV conduction, ventricularly paced rhythm at the rate of 81 bpm.  Left bundle/His bundle pacing.  No further analysis.  Compared to 05/11/2021, AV paced rhythm has been replaced by atrial fibrillation.   Assessment:     ICD-10-CM   1. Paroxysmal atrial fibrillation (HCC)  I48.0 EKG 12-Lead    2. Complete heart block (HCC)  I44.2     3. Pacemaker Dual chamber Medtronic Azure MRI  using His bundle pacing  Z95.0     4. Bioprosthetic 28 mm AV and 21 mm MV 10/12/11  Z95.2 amoxicillin (AMOXIL) 500 MG capsule      CHA2DS2-VASc Score is 3.  Yearly risk of stroke: 3.2% (Age, F, HTN).  Score of 1=0.6; 2=2.2; 3=3.2; 4=4.8; 5=7.2; 6=9.8;  7=>9.8) -(CHF; HTN; vasc disease DM,  Female = 1; Age <65 =0; 65-74 = 1,  >75 =2; stroke/embolism= 2).   Recommendations:   [name]  is a 82 y.o. female  with prior history of severe aortic regurgitation and Mitral regurgitation S/P aortic pericardial tissue valve replacement and mitral valve ring placement on 08/12/2011 with bioprosthetic valves by Dr. Evelene Croon. Since then her ejection fraction is normalized.  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 Although asymptomatic, in view of new onset atrial fibrillation, she underwent direct-current cardioversion on 07/07/2022 and she presents here for 58-month office visit.  She remains asymptomatic.  1. Paroxysmal atrial fibrillation [facility]) Patient is presently maintaining sinus rhythm both by EKG and also by pacemaker transmission.  She is completely asymptomatic and has not noticed any change even after cardioversion.  As her LVEF is preserved, if she were to go back into atrial fibrillation, rate control strategy could be applied.  - EKG 12-Lead  2. Complete heart block [facility]) Patient is pacer dependent, pacemaker is functioning normally.  3. Pacemaker Dual chamber Medtronic Azure MRI  using His bundle pacing I reviewed the data from the pacemaker, no changes were done today.  4. Bioprosthetic 28 mm AV and 21 mm MV 10/12/11 Patient has bioprosthetic aortic valve which is functioning normally and also mitral valve which is functioning normally.  She was started on Xarelto years ago due to premature fibrotic changes that are noted on the bioprosthetic aortic valve and fortunately this was worked and aortic velocities have remained mildly elevated.  No clinical evidence of heart failure and she remains asymptomatic.  Patient is going for dental workup next month,  amoxicillin prophylaxis prescribed.  I will see her back on annual basis.  - amoxicillin (AMOXIL) 500 MG capsule; Take 4 capsules (2,000 mg total) by mouth once as needed for up to 1 dose (Dental prophylaxis).  Dispense: 4 capsule; Refill: 3    Yates Decamp, MD, [facility] 09/12/2022, 2:22 PM Office: [phone] Fax: [phone] Pager: [phone]

## 2022-09-13 ENCOUNTER — Other Ambulatory Visit: Payer: Self-pay | Admitting: Cardiology

## 2022-09-13 DIAGNOSIS — I10 Essential (primary) hypertension: Secondary | ICD-10-CM

## 2022-10-16 ENCOUNTER — Telehealth: Payer: Self-pay

## 2022-10-16 NOTE — Telephone Encounter (Signed)
4 capsules of Amoxicillin 1 hour prior to the dental procedure

## 2022-10-16 NOTE — Telephone Encounter (Signed)
Patient states she received medication from you so she can go to the dentist. I believe its the amoxicillin. Patient states its 5 pills inside of the jar. Should she take all at once or 1 everyday and when should she take it being the her dentist appointment is 10/25/22

## 2022-10-17 NOTE — Telephone Encounter (Signed)
Called pt to inform her .Marland KitchenPatient understands

## 2022-11-29 DIAGNOSIS — Z45018 Encounter for adjustment and management of other part of cardiac pacemaker: Secondary | ICD-10-CM | POA: Diagnosis not present

## 2022-11-29 DIAGNOSIS — I495 Sick sinus syndrome: Secondary | ICD-10-CM | POA: Diagnosis not present

## 2022-12-20 DIAGNOSIS — N1831 Chronic kidney disease, stage 3a: Secondary | ICD-10-CM | POA: Diagnosis not present

## 2022-12-20 DIAGNOSIS — I739 Peripheral vascular disease, unspecified: Secondary | ICD-10-CM | POA: Diagnosis not present

## 2022-12-20 DIAGNOSIS — D696 Thrombocytopenia, unspecified: Secondary | ICD-10-CM | POA: Diagnosis not present

## 2022-12-20 DIAGNOSIS — Z23 Encounter for immunization: Secondary | ICD-10-CM | POA: Diagnosis not present

## 2022-12-20 DIAGNOSIS — E559 Vitamin D deficiency, unspecified: Secondary | ICD-10-CM | POA: Diagnosis not present

## 2022-12-20 DIAGNOSIS — I480 Paroxysmal atrial fibrillation: Secondary | ICD-10-CM | POA: Diagnosis not present

## 2023-02-06 ENCOUNTER — Ambulatory Visit (INDEPENDENT_AMBULATORY_CARE_PROVIDER_SITE_OTHER): Payer: 59

## 2023-02-06 DIAGNOSIS — I442 Atrioventricular block, complete: Secondary | ICD-10-CM

## 2023-02-07 LAB — CUP PACEART REMOTE DEVICE CHECK
Battery Remaining Longevity: 46 mo
Battery Voltage: 2.95 V
Brady Statistic RA Percent Paced: 0.01 %
Brady Statistic RV Percent Paced: 99.96 %
Date Time Interrogation Session: 20241217095819
Implantable Lead Connection Status: 753985
Implantable Lead Connection Status: 753985
Implantable Lead Implant Date: 20181101
Implantable Lead Implant Date: 20181101
Implantable Lead Location: 753859
Implantable Lead Location: 753860
Implantable Lead Model: 3830
Implantable Lead Model: 5076
Implantable Pulse Generator Implant Date: 20181101
Lead Channel Impedance Value: 266 Ohm
Lead Channel Impedance Value: 361 Ohm
Lead Channel Impedance Value: 380 Ohm
Lead Channel Impedance Value: 475 Ohm
Lead Channel Pacing Threshold Amplitude: 0.5 V
Lead Channel Pacing Threshold Amplitude: 0.875 V
Lead Channel Pacing Threshold Pulse Width: 0.4 ms
Lead Channel Pacing Threshold Pulse Width: 0.4 ms
Lead Channel Sensing Intrinsic Amplitude: 16.25 mV
Lead Channel Sensing Intrinsic Amplitude: 16.25 mV
Lead Channel Sensing Intrinsic Amplitude: 3.75 mV
Lead Channel Sensing Intrinsic Amplitude: 3.75 mV
Lead Channel Setting Pacing Amplitude: 2 V
Lead Channel Setting Pacing Amplitude: 2.5 V
Lead Channel Setting Pacing Pulse Width: 0.4 ms
Lead Channel Setting Sensing Sensitivity: 1.2 mV
Zone Setting Status: 755011

## 2023-02-08 ENCOUNTER — Telehealth: Payer: Self-pay

## 2023-02-08 NOTE — Telephone Encounter (Signed)
She is completely asymptomatic, previously after long discussion persuasion she had undergone cardioversion, but she is asymptomatic.  Hence would recommend continued rate control only.

## 2023-02-08 NOTE — Telephone Encounter (Signed)
Attempted outreach to Pt and family.  Unable to get through on Pt listed number, Granddaughter, son or sister.  Will try again.  Need to determine if Pt continues to be asymptomatic with atrial fibrillation.  If remains asymptomatic, per last Dr. Jacinto Halim note-rate control would be preferred option of treatment.  Alert received from [employer]:  Scheduled remote reviewed. Normal device function.   Persistent and ongoing AF since 01/28/23, V rates are controlled, on Eliquis per Epic; Routed to Triage for review.

## 2023-03-16 NOTE — Progress Notes (Signed)
Remote pacemaker transmission.

## 2023-03-16 NOTE — Addendum Note (Signed)
Addended by: Geralyn Flash D on: 03/16/2023 02:52 PM   Modules accepted: Orders

## 2023-03-20 ENCOUNTER — Other Ambulatory Visit: Payer: Self-pay

## 2023-03-20 DIAGNOSIS — Z952 Presence of prosthetic heart valve: Secondary | ICD-10-CM

## 2023-03-20 MED ORDER — AMOXICILLIN 500 MG PO CAPS
2000.0000 mg | ORAL_CAPSULE | Freq: Once | ORAL | 6 refills | Status: DC | PRN
Start: 2023-03-20 — End: 2023-05-21

## 2023-04-17 ENCOUNTER — Other Ambulatory Visit: Payer: Self-pay | Admitting: Cardiology

## 2023-04-18 NOTE — Telephone Encounter (Signed)
 Prescription refill request for Eliquis received. Indication:afib Last office visit:7/24 Scr:1.34  5/24 Age: 83 Weight:73  kg  Prescription refilled

## 2023-04-23 ENCOUNTER — Encounter: Payer: Self-pay | Admitting: Cardiovascular Disease

## 2023-04-23 ENCOUNTER — Ambulatory Visit: Payer: 59 | Attending: Cardiovascular Disease | Admitting: Cardiovascular Disease

## 2023-04-23 VITALS — BP 120/74 | HR 80 | Ht 64.0 in | Wt 161.2 lb

## 2023-04-23 DIAGNOSIS — Z950 Presence of cardiac pacemaker: Secondary | ICD-10-CM

## 2023-04-23 DIAGNOSIS — I442 Atrioventricular block, complete: Secondary | ICD-10-CM

## 2023-04-23 DIAGNOSIS — I495 Sick sinus syndrome: Secondary | ICD-10-CM | POA: Diagnosis not present

## 2023-04-23 DIAGNOSIS — I4819 Other persistent atrial fibrillation: Secondary | ICD-10-CM

## 2023-04-23 LAB — CUP PACEART INCLINIC DEVICE CHECK
Battery Remaining Longevity: 47 mo
Battery Voltage: 2.95 V
Brady Statistic AP VP Percent: 96.3 %
Brady Statistic AP VS Percent: 0.69 %
Brady Statistic AS VP Percent: 2.97 %
Brady Statistic AS VS Percent: 0.04 %
Brady Statistic RA Percent Paced: 68.86 %
Brady Statistic RV Percent Paced: 99.45 %
Date Time Interrogation Session: 20250303114636
Implantable Lead Connection Status: 753985
Implantable Lead Connection Status: 753985
Implantable Lead Implant Date: 20181101
Implantable Lead Implant Date: 20181101
Implantable Lead Location: 753859
Implantable Lead Location: 753860
Implantable Lead Model: 3830
Implantable Lead Model: 5076
Implantable Pulse Generator Implant Date: 20181101
Lead Channel Impedance Value: 304 Ohm
Lead Channel Impedance Value: 361 Ohm
Lead Channel Impedance Value: 418 Ohm
Lead Channel Impedance Value: 494 Ohm
Lead Channel Pacing Threshold Amplitude: 0 V
Lead Channel Pacing Threshold Amplitude: 0.5 V
Lead Channel Pacing Threshold Amplitude: 0.875 V
Lead Channel Pacing Threshold Pulse Width: 0 ms
Lead Channel Pacing Threshold Pulse Width: 0.4 ms
Lead Channel Pacing Threshold Pulse Width: 0.4 ms
Lead Channel Sensing Intrinsic Amplitude: 0 mV
Lead Channel Sensing Intrinsic Amplitude: 16.125 mV
Lead Channel Sensing Intrinsic Amplitude: 16.125 mV
Lead Channel Sensing Intrinsic Amplitude: 4.125 mV
Lead Channel Sensing Intrinsic Amplitude: 4.5 mV
Lead Channel Setting Pacing Amplitude: 2 V
Lead Channel Setting Pacing Amplitude: 2.5 V
Lead Channel Setting Pacing Pulse Width: 0.4 ms
Lead Channel Setting Sensing Sensitivity: 1.2 mV
Zone Setting Status: 755011

## 2023-04-23 NOTE — Progress Notes (Signed)
  Electrophysiology Office Note:    Date:  04/23/2023   ID:  [name], DOB September 10, 1940, MRN [id number]  PCP:  Jackelyn Poling, DO   [facility] Providers Cardiologist:  None     Referring MD: Jackelyn Poling, DO   History of Present Illness:    [name] is a 83 y.o. female with a medical history significant for atrial flutter heart block with Medtronic dual-chamber pacemaker, severe aortic and mitral regurgitation status post aortic pericardial tissue valve and mitral valve ring placed in June 2013 referred for device rhythm management.     She has a history of aortic valve replacement and mitral valve ring placement in June 2013.  A dual-chamber Medtronic pacemaker was placed with complete heart block.  She has developed atrial fibrillation and flutter for which she has been asymptomatic.  She underwent DC cardioversion in May 2024.  She reports that she has not had any palpitations, fatigue, lightheadedness, dizziness.  She did not feel any better after having a DC cardioversion.     Today, she reports that she is doing well and has no complaints.  EKGs/Labs/Other Studies Reviewed Today:     Echocardiogram:  TTE April 2024 EF 60 to 65%.  Bioprosthetic aortic valve.  Cannot exclude prosthetic valve stenosis; TEE recommended if indicated    EKG:   EKG Interpretation Date/Time:  Monday April 23 2023 11:25:13 EST Ventricular Rate:  80 PR Interval:    QRS Duration:  134 QT Interval:  424 QTC Calculation: 489 R Axis:   97  Text Interpretation: Atrial flutter, Ventricular-paced rhythm When compared with ECG of 07-Jul-2022 10:32, Atrial flutter now present Confirmed by York Pellant [phone]) on 04/23/2023 11:33:24 AM     Physical Exam:    VS:  BP 120/74 (BP Location: Left Arm, Patient Position: Sitting, Cuff Size: Normal)   Pulse 80   Ht 5\' 4"  (1.626 m)   Wt 161 lb 3.2 oz (73.1 kg)   SpO2 99%   BMI 27.67 kg/m     Wt Readings from Last 3  Encounters:  04/23/23 161 lb 3.2 oz (73.1 kg)  09/12/22 161 lb (73 kg)  07/07/22 160 lb (72.6 kg)     GEN: Well nourished, well developed in no acute distress CARDIAC: RRR, no murmurs, rubs, gallops RESPIRATORY:  Normal work of breathing MUSCULOSKELETAL: no edema    ASSESSMENT & PLAN:     Complete heart block Medtronic dual-chamber pacemaker placed by Dr. Ladona Ridgel in 2018 I reviewed today's device interrogation.  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

## 2023-04-23 NOTE — Patient Instructions (Signed)
 Medication Instructions:  Your physician recommends that you continue on your current medications as directed. Please refer to the Current Medication list given to you today. *If you need a refill on your cardiac medications before your next appointment, please call your pharmacy*   Follow-Up: At [facility], you and your health needs are our priority.  As part of our continuing mission to provide you with exceptional heart care, we have created designated Provider Care Teams.  These Care Teams include your primary Cardiologist (physician) and Advanced Practice Providers (APPs -  Physician Assistants and Nurse Practitioners) who all work together to provide you with the care you need, when you need it.  We recommend signing up for the patient portal called "MyChart".  Sign up information is provided on this After Visit Summary.  MyChart is used to connect with patients for Virtual Visits (Telemedicine).  Patients are able to view lab/test results, encounter notes, upcoming appointments, etc.  Non-urgent messages can be sent to your provider as well.   To learn more about what you can do with MyChart, go to [email or web address].    Your next appointment:   1 year(s)  Provider:   York Pellant, MD

## 2023-04-30 ENCOUNTER — Telehealth: Payer: Self-pay | Admitting: Cardiology

## 2023-04-30 NOTE — Telephone Encounter (Signed)
   Pre-operative Risk Assessment    Patient Name: [name]  DOB: 1940-07-19 MRN: [id number]   Date of last office visit: 04/23/2023 Date of next office visit: N/A  Request for Surgical Clearance    Procedure:  Dental Extraction - Amount of Teeth to be Pulled:  1  Date of Surgery:  Clearance TBD                                Surgeon:  Dr. Burr Medico Surgeon's Group or Practice Name:  Dr. Dyanne Iha Phone number:  [phone]  Fax number:  [phone]   Type of Clearance Requested:   - Medical  - Pharmacy:  Hold Apixaban (Eliquis) TBD by cardiologist   Type of Anesthesia:  Local    Additional requests/questions:  Please fax a copy of medical clearance to the surgeon's office.  Norva Pavlov Pough   04/30/2023, 11:01 AM

## 2023-04-30 NOTE — Telephone Encounter (Signed)
   Patient Name:  [name]  DOB:  1940/03/19  MRN:  [id number]   Primary Cardiologist: None  Chart reviewed as part of pre-operative protocol coverage.   Simple dental extractions are considered low risk procedures per guidelines and generally do not require any specific cardiac clearance. It is also generally accepted that for simple extractions and dental cleanings, there is no need to interrupt blood thinner therapy.  SBE prophylaxis is required for the patient from a cardiac standpoint.  Recommendations Ok to proceed with single dental extraction. Patient should remain on Eliquis without interruption. Patient should take antibiotic prophylaxis as previously prescribed (Amoxicillin).  Please call with questions.  Tereso Newcomer, PA-C 04/30/2023, 12:36 PM

## 2023-04-30 NOTE — Telephone Encounter (Signed)
 Notes faxed to surgeon. This phone note will be removed from the preop pool. Tereso Newcomer, PA-C  04/30/2023 12:40 PM

## 2023-05-08 ENCOUNTER — Ambulatory Visit (INDEPENDENT_AMBULATORY_CARE_PROVIDER_SITE_OTHER): Payer: 59

## 2023-05-08 DIAGNOSIS — I442 Atrioventricular block, complete: Secondary | ICD-10-CM | POA: Diagnosis not present

## 2023-05-09 LAB — CUP PACEART REMOTE DEVICE CHECK
Battery Remaining Longevity: 46 mo
Battery Voltage: 2.95 V
Brady Statistic RA Percent Paced: 0.01 %
Brady Statistic RV Percent Paced: 99.81 %
Date Time Interrogation Session: 20250318023157
Implantable Lead Connection Status: 753985
Implantable Lead Connection Status: 753985
Implantable Lead Implant Date: 20181101
Implantable Lead Implant Date: 20181101
Implantable Lead Location: 753859
Implantable Lead Location: 753860
Implantable Lead Model: 3830
Implantable Lead Model: 5076
Implantable Pulse Generator Implant Date: 20181101
Lead Channel Impedance Value: 285 Ohm
Lead Channel Impedance Value: 361 Ohm
Lead Channel Impedance Value: 418 Ohm
Lead Channel Impedance Value: 494 Ohm
Lead Channel Pacing Threshold Amplitude: 0.5 V
Lead Channel Pacing Threshold Amplitude: 0.875 V
Lead Channel Pacing Threshold Pulse Width: 0.4 ms
Lead Channel Pacing Threshold Pulse Width: 0.4 ms
Lead Channel Sensing Intrinsic Amplitude: 15 mV
Lead Channel Sensing Intrinsic Amplitude: 15 mV
Lead Channel Sensing Intrinsic Amplitude: 4.875 mV
Lead Channel Sensing Intrinsic Amplitude: 4.875 mV
Lead Channel Setting Pacing Amplitude: 2 V
Lead Channel Setting Pacing Amplitude: 2.5 V
Lead Channel Setting Pacing Pulse Width: 0.4 ms
Lead Channel Setting Sensing Sensitivity: 1.2 mV
Zone Setting Status: 755011

## 2023-05-21 ENCOUNTER — Telehealth: Payer: Self-pay | Admitting: Cardiology

## 2023-05-21 DIAGNOSIS — Z952 Presence of prosthetic heart valve: Secondary | ICD-10-CM

## 2023-05-21 MED ORDER — AMOXICILLIN 500 MG PO CAPS
2000.0000 mg | ORAL_CAPSULE | Freq: Once | ORAL | 0 refills | Status: AC | PRN
Start: 2023-05-21 — End: ?

## 2023-05-21 NOTE — Telephone Encounter (Signed)
 Pt's medication was sent to pt's pharmacy as requested. Confirmation received.

## 2023-05-21 NOTE — Telephone Encounter (Signed)
*  STAT* If patient is at the pharmacy, call can be transferred to refill team.   1. Which medications need to be refilled? (please list name of each medication and dose if known)   amoxicillin (AMOXIL) 500 MG capsule    2. Which pharmacy/location (including street and city if local pharmacy) is medication to be sent to? [facility] - [place], [place] - [address] [place]   3. Do they need a 30 day or 90 day supply?

## 2023-06-08 ENCOUNTER — Telehealth: Payer: Self-pay | Admitting: Cardiology

## 2023-06-08 NOTE — Telephone Encounter (Signed)
 Spoke with pt and has noted nose bleed Instructed  pt to use Saline nasal spray as directed and if no improvement seek medical care via ED.Per pt never bleeds that much .

## 2023-06-08 NOTE — Telephone Encounter (Signed)
 Pt c/o medication issue:  1. Name of Medication: ELIQUIS  5 MG TABS tablet   2. How are you currently taking this medication (dosage and times per day)? Yes  3. Are you having a reaction (difficulty breathing--STAT)? No  4. What is your medication issue? Pt would like a c/b in regards to this medication she's unsure if its making her nose bleed. Please advise

## 2023-06-13 ENCOUNTER — Other Ambulatory Visit: Payer: Self-pay | Admitting: Cardiology

## 2023-06-13 DIAGNOSIS — I10 Essential (primary) hypertension: Secondary | ICD-10-CM

## 2023-06-26 NOTE — Progress Notes (Signed)
 Remote pacemaker transmission.

## 2023-06-26 NOTE — Addendum Note (Signed)
 Addended by: Lott Rouleau A on: 06/26/2023 08:28 AM   Modules accepted: Orders

## 2023-08-07 ENCOUNTER — Ambulatory Visit: Payer: 59

## 2023-08-07 DIAGNOSIS — I442 Atrioventricular block, complete: Secondary | ICD-10-CM | POA: Diagnosis not present

## 2023-08-07 LAB — CUP PACEART REMOTE DEVICE CHECK
Battery Remaining Longevity: 44 mo
Battery Voltage: 2.94 V
Brady Statistic RA Percent Paced: 0 %
Brady Statistic RV Percent Paced: 99.91 %
Date Time Interrogation Session: 20250617023213
Implantable Lead Connection Status: 753985
Implantable Lead Connection Status: 753985
Implantable Lead Implant Date: 20181101
Implantable Lead Implant Date: 20181101
Implantable Lead Location: 753859
Implantable Lead Location: 753860
Implantable Lead Model: 3830
Implantable Lead Model: 5076
Implantable Pulse Generator Implant Date: 20181101
Lead Channel Impedance Value: 285 Ohm
Lead Channel Impedance Value: 342 Ohm
Lead Channel Impedance Value: 399 Ohm
Lead Channel Impedance Value: 475 Ohm
Lead Channel Pacing Threshold Amplitude: 0.5 V
Lead Channel Pacing Threshold Amplitude: 0.875 V
Lead Channel Pacing Threshold Pulse Width: 0.4 ms
Lead Channel Pacing Threshold Pulse Width: 0.4 ms
Lead Channel Sensing Intrinsic Amplitude: 15 mV
Lead Channel Sensing Intrinsic Amplitude: 15 mV
Lead Channel Sensing Intrinsic Amplitude: 4.375 mV
Lead Channel Sensing Intrinsic Amplitude: 4.375 mV
Lead Channel Setting Pacing Amplitude: 2 V
Lead Channel Setting Pacing Amplitude: 2.5 V
Lead Channel Setting Pacing Pulse Width: 0.4 ms
Lead Channel Setting Sensing Sensitivity: 1.2 mV
Zone Setting Status: 755011

## 2023-08-09 ENCOUNTER — Ambulatory Visit: Payer: Self-pay | Admitting: Cardiovascular Disease

## 2023-08-12 ENCOUNTER — Other Ambulatory Visit: Payer: Self-pay | Admitting: Cardiology

## 2023-08-12 DIAGNOSIS — I10 Essential (primary) hypertension: Secondary | ICD-10-CM

## 2023-08-15 DIAGNOSIS — Z950 Presence of cardiac pacemaker: Secondary | ICD-10-CM | POA: Diagnosis not present

## 2023-08-15 DIAGNOSIS — I480 Paroxysmal atrial fibrillation: Secondary | ICD-10-CM | POA: Diagnosis not present

## 2023-08-15 DIAGNOSIS — E785 Hyperlipidemia, unspecified: Secondary | ICD-10-CM | POA: Diagnosis not present

## 2023-08-15 DIAGNOSIS — N1831 Chronic kidney disease, stage 3a: Secondary | ICD-10-CM | POA: Diagnosis not present

## 2023-08-15 DIAGNOSIS — E559 Vitamin D deficiency, unspecified: Secondary | ICD-10-CM | POA: Diagnosis not present

## 2023-08-15 DIAGNOSIS — M810 Age-related osteoporosis without current pathological fracture: Secondary | ICD-10-CM | POA: Diagnosis not present

## 2023-08-15 DIAGNOSIS — D696 Thrombocytopenia, unspecified: Secondary | ICD-10-CM | POA: Diagnosis not present

## 2023-08-15 DIAGNOSIS — R7303 Prediabetes: Secondary | ICD-10-CM | POA: Diagnosis not present

## 2023-08-15 DIAGNOSIS — I129 Hypertensive chronic kidney disease with stage 1 through stage 4 chronic kidney disease, or unspecified chronic kidney disease: Secondary | ICD-10-CM | POA: Diagnosis not present

## 2023-08-15 DIAGNOSIS — R82998 Other abnormal findings in urine: Secondary | ICD-10-CM | POA: Diagnosis not present

## 2023-09-12 ENCOUNTER — Ambulatory Visit: Payer: 59 | Admitting: Cardiology

## 2023-10-14 ENCOUNTER — Other Ambulatory Visit: Payer: Self-pay | Admitting: Cardiology

## 2023-10-14 DIAGNOSIS — I10 Essential (primary) hypertension: Secondary | ICD-10-CM

## 2023-10-19 NOTE — Progress Notes (Signed)
 Remote pacemaker transmission.

## 2023-11-01 ENCOUNTER — Other Ambulatory Visit: Payer: Self-pay | Admitting: Nephrology

## 2023-11-01 DIAGNOSIS — E559 Vitamin D deficiency, unspecified: Secondary | ICD-10-CM | POA: Diagnosis not present

## 2023-11-01 DIAGNOSIS — N189 Chronic kidney disease, unspecified: Secondary | ICD-10-CM | POA: Diagnosis not present

## 2023-11-01 DIAGNOSIS — N1832 Chronic kidney disease, stage 3b: Secondary | ICD-10-CM

## 2023-11-01 DIAGNOSIS — I129 Hypertensive chronic kidney disease with stage 1 through stage 4 chronic kidney disease, or unspecified chronic kidney disease: Secondary | ICD-10-CM | POA: Diagnosis not present

## 2023-11-01 DIAGNOSIS — D631 Anemia in chronic kidney disease: Secondary | ICD-10-CM | POA: Diagnosis not present

## 2023-11-01 LAB — LAB REPORT - SCANNED
Albumin, Urine POC: 353.1
Albumin/Creatinine Ratio, Urine, POC: 433
Creatinine, POC: 81.5 mg/dL
EGFR: 36

## 2023-11-06 ENCOUNTER — Ambulatory Visit (INDEPENDENT_AMBULATORY_CARE_PROVIDER_SITE_OTHER): Payer: 59

## 2023-11-06 DIAGNOSIS — I442 Atrioventricular block, complete: Secondary | ICD-10-CM

## 2023-11-07 LAB — CUP PACEART REMOTE DEVICE CHECK
Battery Remaining Longevity: 40 mo
Battery Voltage: 2.94 V
Brady Statistic RA Percent Paced: 0 %
Brady Statistic RV Percent Paced: 99.92 %
Date Time Interrogation Session: 20250916023300
Implantable Lead Connection Status: 753985
Implantable Lead Connection Status: 753985
Implantable Lead Implant Date: 20181101
Implantable Lead Implant Date: 20181101
Implantable Lead Location: 753859
Implantable Lead Location: 753860
Implantable Lead Model: 3830
Implantable Lead Model: 5076
Implantable Pulse Generator Implant Date: 20181101
Lead Channel Impedance Value: 285 Ohm
Lead Channel Impedance Value: 361 Ohm
Lead Channel Impedance Value: 399 Ohm
Lead Channel Impedance Value: 475 Ohm
Lead Channel Pacing Threshold Amplitude: 0.5 V
Lead Channel Pacing Threshold Amplitude: 0.875 V
Lead Channel Pacing Threshold Pulse Width: 0.4 ms
Lead Channel Pacing Threshold Pulse Width: 0.4 ms
Lead Channel Sensing Intrinsic Amplitude: 17.125 mV
Lead Channel Sensing Intrinsic Amplitude: 17.125 mV
Lead Channel Sensing Intrinsic Amplitude: 4.125 mV
Lead Channel Sensing Intrinsic Amplitude: 4.125 mV
Lead Channel Setting Pacing Amplitude: 2 V
Lead Channel Setting Pacing Amplitude: 2.5 V
Lead Channel Setting Pacing Pulse Width: 0.4 ms
Lead Channel Setting Sensing Sensitivity: 1.2 mV
Zone Setting Status: 755011

## 2023-11-08 ENCOUNTER — Ambulatory Visit
Admission: RE | Admit: 2023-11-08 | Discharge: 2023-11-08 | Disposition: A | Discharge status: Home / Self Care | Source: Ambulatory Visit | Attending: Nephrology | Admitting: Nephrology

## 2023-11-08 DIAGNOSIS — N281 Cyst of kidney, acquired: Secondary | ICD-10-CM | POA: Diagnosis not present

## 2023-11-08 DIAGNOSIS — N1832 Chronic kidney disease, stage 3b: Secondary | ICD-10-CM

## 2023-11-10 ENCOUNTER — Ambulatory Visit: Payer: Self-pay | Admitting: Cardiovascular Disease

## 2023-11-12 NOTE — Progress Notes (Signed)
 Remote PPM Transmission

## 2023-11-22 DIAGNOSIS — N1832 Chronic kidney disease, stage 3b: Secondary | ICD-10-CM | POA: Diagnosis not present

## 2023-12-29 ENCOUNTER — Other Ambulatory Visit: Payer: Self-pay | Admitting: Cardiology

## 2023-12-29 DIAGNOSIS — I480 Paroxysmal atrial fibrillation: Secondary | ICD-10-CM

## 2023-12-31 NOTE — Telephone Encounter (Signed)
 Eliquis  5mg  refill request received. Patient is 83 years old, weight-73.1kg, Crea-1.45 on 11/22/23 via [employer] tab, Diagnosis-Aflutter, and last seen by Dr. Nancey on 04/23/23. Dose is appropriate based on dosing criteria. Will send in refill to requested pharmacy.

## 2024-02-05 ENCOUNTER — Ambulatory Visit: Payer: 59

## 2024-02-05 DIAGNOSIS — I480 Paroxysmal atrial fibrillation: Secondary | ICD-10-CM | POA: Diagnosis not present

## 2024-02-07 LAB — CUP PACEART REMOTE DEVICE CHECK
Battery Remaining Longevity: 40 mo
Battery Voltage: 2.93 V
Brady Statistic RA Percent Paced: 0 %
Brady Statistic RV Percent Paced: 99.91 %
Date Time Interrogation Session: 20251216013319
Implantable Lead Connection Status: 753985
Implantable Lead Connection Status: 753985
Implantable Lead Implant Date: 20181101
Implantable Lead Implant Date: 20181101
Implantable Lead Location: 753859
Implantable Lead Location: 753860
Implantable Lead Model: 3830
Implantable Lead Model: 5076
Implantable Pulse Generator Implant Date: 20181101
Lead Channel Impedance Value: 304 Ohm
Lead Channel Impedance Value: 361 Ohm
Lead Channel Impedance Value: 437 Ohm
Lead Channel Impedance Value: 494 Ohm
Lead Channel Pacing Threshold Amplitude: 0.5 V
Lead Channel Pacing Threshold Amplitude: 1.125 V
Lead Channel Pacing Threshold Pulse Width: 0.4 ms
Lead Channel Pacing Threshold Pulse Width: 0.4 ms
Lead Channel Sensing Intrinsic Amplitude: 15.25 mV
Lead Channel Sensing Intrinsic Amplitude: 15.25 mV
Lead Channel Sensing Intrinsic Amplitude: 4.5 mV
Lead Channel Sensing Intrinsic Amplitude: 4.5 mV
Lead Channel Setting Pacing Amplitude: 2 V
Lead Channel Setting Pacing Amplitude: 2.5 V
Lead Channel Setting Pacing Pulse Width: 0.4 ms
Lead Channel Setting Sensing Sensitivity: 1.2 mV
Zone Setting Status: 755011

## 2024-02-08 NOTE — Progress Notes (Signed)
 Remote PPM Transmission

## 2024-02-20 ENCOUNTER — Ambulatory Visit: Payer: Self-pay | Admitting: Cardiovascular Disease

## 2024-05-06 ENCOUNTER — Ambulatory Visit

## 2024-08-05 ENCOUNTER — Ambulatory Visit

## 2024-11-04 ENCOUNTER — Ambulatory Visit

## 2025-02-03 ENCOUNTER — Ambulatory Visit

## 2025-05-05 ENCOUNTER — Ambulatory Visit
